# Patient Record
Sex: Female | Born: 1940 | Race: White | Hispanic: No | Smoking: Current some day smoker
Health system: Southern US, Community
[De-identification: ages and names within clinical notes are randomized; demographics above are authoritative.]

## PROBLEM LIST (undated history)

## (undated) ENCOUNTER — Emergency Department (HOSPITAL_COMMUNITY): Admission: EM | Payer: Medicare Other | Source: Home / Self Care

## (undated) DIAGNOSIS — R079 Chest pain, unspecified: Secondary | ICD-10-CM

## (undated) DIAGNOSIS — D509 Iron deficiency anemia, unspecified: Secondary | ICD-10-CM

## (undated) DIAGNOSIS — R0902 Hypoxemia: Secondary | ICD-10-CM

## (undated) DIAGNOSIS — J9601 Acute respiratory failure with hypoxia: Secondary | ICD-10-CM

## (undated) DIAGNOSIS — J449 Chronic obstructive pulmonary disease, unspecified: Secondary | ICD-10-CM

## (undated) DIAGNOSIS — T8859XA Other complications of anesthesia, initial encounter: Secondary | ICD-10-CM

## (undated) DIAGNOSIS — T4145XA Adverse effect of unspecified anesthetic, initial encounter: Secondary | ICD-10-CM

## (undated) DIAGNOSIS — K3189 Other diseases of stomach and duodenum: Secondary | ICD-10-CM

## (undated) DIAGNOSIS — R0602 Shortness of breath: Secondary | ICD-10-CM

## (undated) DIAGNOSIS — M542 Cervicalgia: Secondary | ICD-10-CM

## (undated) DIAGNOSIS — F17200 Nicotine dependence, unspecified, uncomplicated: Secondary | ICD-10-CM

## (undated) DIAGNOSIS — M25522 Pain in left elbow: Secondary | ICD-10-CM

## (undated) DIAGNOSIS — R911 Solitary pulmonary nodule: Secondary | ICD-10-CM

## (undated) DIAGNOSIS — I499 Cardiac arrhythmia, unspecified: Secondary | ICD-10-CM

## (undated) DIAGNOSIS — Z9889 Other specified postprocedural states: Secondary | ICD-10-CM

## (undated) DIAGNOSIS — R51 Headache: Secondary | ICD-10-CM

## (undated) DIAGNOSIS — K2950 Unspecified chronic gastritis without bleeding: Secondary | ICD-10-CM

## (undated) DIAGNOSIS — G8929 Other chronic pain: Secondary | ICD-10-CM

## (undated) DIAGNOSIS — R634 Abnormal weight loss: Secondary | ICD-10-CM

## (undated) DIAGNOSIS — R1031 Right lower quadrant pain: Secondary | ICD-10-CM

## (undated) DIAGNOSIS — R1314 Dysphagia, pharyngoesophageal phase: Secondary | ICD-10-CM

## (undated) DIAGNOSIS — R519 Headache, unspecified: Secondary | ICD-10-CM

## (undated) DIAGNOSIS — F419 Anxiety disorder, unspecified: Secondary | ICD-10-CM

## (undated) DIAGNOSIS — E46 Unspecified protein-calorie malnutrition: Secondary | ICD-10-CM

## (undated) DIAGNOSIS — R413 Other amnesia: Secondary | ICD-10-CM

## (undated) DIAGNOSIS — E785 Hyperlipidemia, unspecified: Secondary | ICD-10-CM

## (undated) HISTORY — DX: Acute respiratory failure with hypoxia: J96.01

## (undated) HISTORY — DX: Right lower quadrant pain: R10.31

## (undated) HISTORY — DX: Chronic obstructive pulmonary disease, unspecified: J44.9

## (undated) HISTORY — DX: Pain in left elbow: M25.522

## (undated) HISTORY — DX: Solitary pulmonary nodule: R91.1

## (undated) HISTORY — DX: Other diseases of stomach and duodenum: K31.89

## (undated) HISTORY — DX: Other amnesia: R41.3

## (undated) HISTORY — DX: Anxiety disorder, unspecified: F41.9

## (undated) HISTORY — DX: Abnormal weight loss: R63.4

## (undated) HISTORY — DX: Unspecified protein-calorie malnutrition: E46

## (undated) HISTORY — PX: APPENDECTOMY: SHX54

## (undated) HISTORY — DX: Hyperlipidemia, unspecified: E78.5

## (undated) HISTORY — PX: INGUINAL HERNIA REPAIR: SUR1180

## (undated) HISTORY — PX: TONSILLECTOMY: SUR1361

## (undated) HISTORY — DX: Other specified postprocedural states: Z98.890

## (undated) HISTORY — PX: CHOLECYSTECTOMY: SHX55

## (undated) HISTORY — DX: Unspecified chronic gastritis without bleeding: K29.50

## (undated) HISTORY — DX: Shortness of breath: R06.02

## (undated) HISTORY — DX: Iron deficiency anemia, unspecified: D50.9

## (undated) HISTORY — DX: Chest pain, unspecified: R07.9

## (undated) HISTORY — PX: TOTAL ABDOMINAL HYSTERECTOMY: SHX209

## (undated) HISTORY — DX: Dysphagia, pharyngoesophageal phase: R13.14

## (undated) HISTORY — DX: Nicotine dependence, unspecified, uncomplicated: F17.200

## (undated) HISTORY — DX: Hypoxemia: R09.02

---

## 2004-05-19 ENCOUNTER — Emergency Department (HOSPITAL_COMMUNITY): Admission: EM | Admit: 2004-05-19 | Discharge: 2004-05-19 | Payer: Self-pay | Admitting: Emergency Medicine

## 2004-10-31 ENCOUNTER — Observation Stay (HOSPITAL_COMMUNITY): Admission: EM | Admit: 2004-10-31 | Discharge: 2004-11-01 | Payer: Self-pay | Admitting: Emergency Medicine

## 2005-03-29 ENCOUNTER — Emergency Department (HOSPITAL_COMMUNITY): Admission: EM | Admit: 2005-03-29 | Discharge: 2005-03-29 | Payer: Self-pay | Admitting: *Deleted

## 2005-04-19 ENCOUNTER — Emergency Department (HOSPITAL_COMMUNITY): Admission: EM | Admit: 2005-04-19 | Discharge: 2005-04-19 | Payer: Self-pay | Admitting: Emergency Medicine

## 2006-07-21 ENCOUNTER — Emergency Department (HOSPITAL_COMMUNITY): Admission: EM | Admit: 2006-07-21 | Discharge: 2006-07-21 | Payer: Self-pay | Admitting: Emergency Medicine

## 2006-09-18 ENCOUNTER — Emergency Department (HOSPITAL_COMMUNITY): Admission: EM | Admit: 2006-09-18 | Discharge: 2006-09-18 | Payer: Self-pay | Admitting: Emergency Medicine

## 2007-02-17 ENCOUNTER — Encounter: Admission: RE | Admit: 2007-02-17 | Discharge: 2007-02-17 | Payer: Self-pay | Admitting: Internal Medicine

## 2007-03-26 ENCOUNTER — Inpatient Hospital Stay (HOSPITAL_COMMUNITY): Admission: EM | Admit: 2007-03-26 | Discharge: 2007-03-27 | Payer: Self-pay | Admitting: Emergency Medicine

## 2007-07-18 ENCOUNTER — Emergency Department (HOSPITAL_COMMUNITY): Admission: EM | Admit: 2007-07-18 | Discharge: 2007-07-19 | Payer: Self-pay | Admitting: Emergency Medicine

## 2007-09-16 ENCOUNTER — Emergency Department (HOSPITAL_COMMUNITY): Admission: EM | Admit: 2007-09-16 | Discharge: 2007-09-16 | Payer: Self-pay | Admitting: Emergency Medicine

## 2007-12-24 DIAGNOSIS — Z9889 Other specified postprocedural states: Secondary | ICD-10-CM

## 2007-12-24 HISTORY — DX: Other specified postprocedural states: Z98.890

## 2008-09-07 ENCOUNTER — Ambulatory Visit (HOSPITAL_COMMUNITY): Admission: RE | Admit: 2008-09-07 | Discharge: 2008-09-07 | Payer: Self-pay | Admitting: Family Medicine

## 2008-10-10 ENCOUNTER — Ambulatory Visit (HOSPITAL_COMMUNITY): Admission: RE | Admit: 2008-10-10 | Discharge: 2008-10-10 | Payer: Self-pay | Admitting: Internal Medicine

## 2008-10-10 ENCOUNTER — Ambulatory Visit: Payer: Self-pay | Admitting: Internal Medicine

## 2008-10-10 HISTORY — PX: COLONOSCOPY: SHX174

## 2009-04-07 ENCOUNTER — Emergency Department (HOSPITAL_COMMUNITY): Admission: EM | Admit: 2009-04-07 | Discharge: 2009-04-07 | Payer: Self-pay | Admitting: Emergency Medicine

## 2009-04-20 ENCOUNTER — Ambulatory Visit (HOSPITAL_COMMUNITY): Admission: RE | Admit: 2009-04-20 | Discharge: 2009-04-20 | Payer: Self-pay | Admitting: Family Medicine

## 2009-12-02 ENCOUNTER — Emergency Department (HOSPITAL_COMMUNITY): Admission: EM | Admit: 2009-12-02 | Discharge: 2009-12-02 | Payer: Self-pay | Admitting: Emergency Medicine

## 2009-12-23 ENCOUNTER — Emergency Department (HOSPITAL_COMMUNITY): Admission: EM | Admit: 2009-12-23 | Discharge: 2009-12-23 | Payer: Self-pay | Admitting: Emergency Medicine

## 2010-01-03 ENCOUNTER — Inpatient Hospital Stay (HOSPITAL_COMMUNITY)
Admission: EM | Admit: 2010-01-03 | Discharge: 2010-01-06 | Payer: Self-pay | Source: Home / Self Care | Admitting: Emergency Medicine

## 2010-05-28 ENCOUNTER — Emergency Department (HOSPITAL_COMMUNITY): Admission: EM | Admit: 2010-05-28 | Discharge: 2010-05-28 | Payer: Self-pay | Admitting: Emergency Medicine

## 2010-12-23 DIAGNOSIS — Z9889 Other specified postprocedural states: Secondary | ICD-10-CM

## 2010-12-23 HISTORY — DX: Other specified postprocedural states: Z98.890

## 2010-12-24 ENCOUNTER — Emergency Department (HOSPITAL_COMMUNITY)
Admission: EM | Admit: 2010-12-24 | Discharge: 2010-12-25 | Payer: Self-pay | Source: Home / Self Care | Admitting: Emergency Medicine

## 2011-03-10 LAB — D-DIMER, QUANTITATIVE: D-Dimer, Quant: 0.22 ug/mL-FEU (ref 0.00–0.48)

## 2011-03-10 LAB — BLOOD GAS, ARTERIAL
Acid-Base Excess: 2.9 mmol/L — ABNORMAL HIGH (ref 0.0–2.0)
Bicarbonate: 27.2 mEq/L — ABNORMAL HIGH (ref 20.0–24.0)
O2 Content: 2 L/min
O2 Saturation: 92.4 %
Patient temperature: 37
TCO2: 23.7 mmol/L (ref 0–100)
pCO2 arterial: 44 mmHg (ref 35.0–45.0)
pH, Arterial: 7.407 — ABNORMAL HIGH (ref 7.350–7.400)
pO2, Arterial: 62.4 mmHg — ABNORMAL LOW (ref 80.0–100.0)

## 2011-03-10 LAB — DIFFERENTIAL
Basophils Absolute: 0.1 10*3/uL (ref 0.0–0.1)
Basophils Relative: 1 % (ref 0–1)
Eosinophils Absolute: 0.9 10*3/uL — ABNORMAL HIGH (ref 0.0–0.7)
Eosinophils Relative: 11 % — ABNORMAL HIGH (ref 0–5)
Lymphocytes Relative: 33 % (ref 12–46)
Lymphs Abs: 2.7 10*3/uL (ref 0.7–4.0)
Monocytes Absolute: 0.6 10*3/uL (ref 0.1–1.0)
Monocytes Relative: 7 % (ref 3–12)
Neutro Abs: 3.9 10*3/uL (ref 1.7–7.7)
Neutrophils Relative %: 48 % (ref 43–77)

## 2011-03-10 LAB — CBC
HCT: 39.7 % (ref 36.0–46.0)
Hemoglobin: 13.3 g/dL (ref 12.0–15.0)
MCHC: 33.5 g/dL (ref 30.0–36.0)
MCV: 92.8 fL (ref 78.0–100.0)
Platelets: 232 10*3/uL (ref 150–400)
RBC: 4.27 MIL/uL (ref 3.87–5.11)
RDW: 12.9 % (ref 11.5–15.5)
WBC: 8 10*3/uL (ref 4.0–10.5)

## 2011-03-10 LAB — BASIC METABOLIC PANEL
BUN: 11 mg/dL (ref 6–23)
BUN: 14 mg/dL (ref 6–23)
CO2: 30 mEq/L (ref 19–32)
CO2: 31 mEq/L (ref 19–32)
Calcium: 9.5 mg/dL (ref 8.4–10.5)
Calcium: 9.5 mg/dL (ref 8.4–10.5)
Chloride: 100 mEq/L (ref 96–112)
Chloride: 104 mEq/L (ref 96–112)
Creatinine, Ser: 0.72 mg/dL (ref 0.4–1.2)
Creatinine, Ser: 1.06 mg/dL (ref 0.4–1.2)
GFR calc Af Amer: >60 mL/min (ref >60)
GFR calc Af Amer: >60 mL/min (ref >60)
GFR calc non Af Amer: 52 mL/min — ABNORMAL LOW (ref >60)
GFR calc non Af Amer: >60 mL/min (ref >60)
Glucose, Bld: 106 mg/dL — ABNORMAL HIGH (ref 70–99)
Glucose, Bld: 97 mg/dL (ref 70–99)
Potassium: 3.7 mEq/L (ref 3.5–5.1)
Potassium: 4.2 mEq/L (ref 3.5–5.1)
Sodium: 140 mEq/L (ref 135–145)
Sodium: 140 mEq/L (ref 135–145)

## 2011-03-10 LAB — CK TOTAL AND CKMB (NOT AT ARMC)
CK, MB: 3.2 ng/mL (ref 0.3–4.0)
Relative Index: INVALID (ref 0.0–2.5)
Total CK: 90 U/L (ref 7–177)

## 2011-03-10 LAB — TROPONIN I: Troponin I: 0.03 ng/mL (ref 0.00–0.06)

## 2011-03-11 ENCOUNTER — Encounter: Payer: Self-pay | Admitting: Gastroenterology

## 2011-03-11 ENCOUNTER — Ambulatory Visit (INDEPENDENT_AMBULATORY_CARE_PROVIDER_SITE_OTHER): Payer: Medicare Other | Admitting: Gastroenterology

## 2011-03-11 DIAGNOSIS — Z1211 Encounter for screening for malignant neoplasm of colon: Secondary | ICD-10-CM

## 2011-03-11 DIAGNOSIS — R1310 Dysphagia, unspecified: Secondary | ICD-10-CM | POA: Insufficient documentation

## 2011-03-11 DIAGNOSIS — R1319 Other dysphagia: Secondary | ICD-10-CM

## 2011-03-21 NOTE — Assessment & Plan Note (Signed)
Summary: PROBLEMS SWALLOWING/POSS EGD/LAW   Vital Signs:  Patient profile:   70 year old female Height:      64 inches Weight:      106 pounds BMI:     18.26 Temp:     98.7 degrees F oral Pulse rate:   72 / minute BP sitting:   110 / 76  (left arm)  Vitals Entered By: Carolan Clines LPN (March 11, 2011 2:45 PM)  Visit Type:  Initial Consult Referring Provider:  self-referral Primary Care Provider:  Dr. Renard Matter   History of Present Illness: 70 year old pleasant Caucasian female who presents with c/o dysphagia X a few months. Seems to be worsening. Difficulty with all types of textures. Denies epigastric pain. Mild, rare episodes of reflux. Did lose from 122 down to 95, now to 106. Was under a lot of stress at work, long hours, heavy lifting. Feels this may have been large contributor to wt loss. Denies lack of appetite. No nausea.  BMs normal, no melena or brbpr.   Used to live in [place]; had multipled EGD/EDs in past. Last one 6-7 years ago. Pt unsure why. Reports are being requested now.   Current Medications (verified): 1)  Advair Diskus 250-50 Mcg/dose Aepb (Fluticasone-Salmeterol) .... Take One Puff Two Times A Day 2)  Proventil Hfa 108 (90 Base) Mcg/act Aers (Albuterol Sulfate) .... One To Two Puffs Q4-6hrs As Needed  Allergies (verified): No Known Drug Allergies  Past History:  Past Medical History: Asthma COPD hx of dysphagia with multiple esophageal dilatations TCS 2009 with Dr. Jena Gauss: anal papilla, 2 cecal AVMs. Due in 2014.   Past Surgical History: Appendectomy inguinal hernia tonsillectomy total hysterectomy cholecystectomy  Family History: No FH of Colon Cancer:  Social History: Occupation: Retired from [employer] Patient currently smokes. 1ppd Alcohol Use - no Smoking Status:  current  Review of Systems General:  Denies fever, chills, and anorexia. Eyes:  Denies blurring, irritation, and discharge. ENT:  Complains of difficulty swallowing;  denies sore throat and hoarseness. CV:  Denies chest pains and syncope. Resp:  Denies dyspnea at rest and wheezing. GI:  See HPI. GU:  Denies urinary burning, urinary frequency, and urinary incontinence. MS:  Denies joint pain / LOM, joint swelling, and joint stiffness. Derm:  Denies rash, itching, dry skin, and hives. Neuro:  Denies weakness and syncope. Psych:  Denies depression and anxiety. Endo:  Denies cold intolerance and heat intolerance. Heme:  Denies bruising and bleeding.  Physical Exam  General:  Well developed, well nourished, no acute distress. Head:  Normocephalic and atraumatic. Eyes:  PERRLA, no icterus. Mouth:  No deformity or lesions, dentition normal. Lungs:  Clear throughout to auscultation. Heart:  Regular rate and rhythm; no murmurs, rubs,  or bruits. Abdomen:  +BS, thin, soft, non-distended. non-tender. no rebound or guarding. No HSM. no masses noted.  Msk:  Symmetrical with no gross deformities. Normal posture. Extremities:  No clubbing, cyanosis, edema or deformities noted. Neurologic:  Alert and  oriented x4;  grossly normal neurologically. Skin:  Intact without significant lesions or rashes. Psych:  Alert and cooperative. Normal mood and affect.   Impression & Recommendations:  Problem # 1:  DYSPHAGIA (ICD-73.46)  70 year old female with hx of multiple esophageal dilatations in [place] in past; last done 6-7 years ago. Do not currently have records. Presents with recurrent dysphagia with all types of textures, liquids. worsening over past few months. No nausea. No abdominal pain. rare reflux, mild. Likely due to esophageal web, ring, or stricture.  Did have episode of wt loss in past losing to upper 90s, yet has continued to regain wt. Feels may be attributed to stress at work. Will monitor closely. Low-likelihood of malignancy. Pt is of thin stature per her report.   EGD/ED with Dr. Jena Gauss in near future. The R/B/A have been discussed in detail with  pt; she desires to proceed Retrieve reports from [place] if possible As of note, pt states has had difficulty with awakening from sedation in past. Reports small amount needed. (Versed 3 mg, Demerol 50 at last colonoscopy). Monitor wt closely.   Orders: New Patient Level III (96295)  Problem # 2:  SCREENING COLORECTAL-CANCER (ICD-V76.51)  Last in 2009 with 2 cecal AVMs, anal papilla. Due for repeat in 2014. No problems currently.   Orders: New Patient Level III (28413)   Orders Added: 1)  New Patient Level III [24401]

## 2011-03-26 LAB — CBC
HCT: 39.7 % (ref 36.0–46.0)
MCV: 92 fL (ref 78.0–100.0)
Platelets: 265 10*3/uL (ref 150–400)
RBC: 4.32 MIL/uL (ref 3.87–5.11)
WBC: 9.3 10*3/uL (ref 4.0–10.5)

## 2011-03-26 LAB — DIFFERENTIAL
Eosinophils Absolute: 0.6 10*3/uL (ref 0.0–0.7)
Lymphocytes Relative: 49 % — ABNORMAL HIGH (ref 12–46)
Lymphs Abs: 4.6 10*3/uL — ABNORMAL HIGH (ref 0.7–4.0)
Monocytes Relative: 9 % (ref 3–12)
Neutrophils Relative %: 34 % — ABNORMAL LOW (ref 43–77)

## 2011-03-26 LAB — BASIC METABOLIC PANEL
Chloride: 108 mEq/L (ref 96–112)
Creatinine, Ser: 0.89 mg/dL (ref 0.4–1.2)
GFR calc Af Amer: >60 mL/min (ref >60)
GFR calc non Af Amer: >60 mL/min (ref >60)
Potassium: 4.2 mEq/L (ref 3.5–5.1)

## 2011-04-02 ENCOUNTER — Encounter: Payer: Medicare Other | Admitting: Internal Medicine

## 2011-04-02 ENCOUNTER — Ambulatory Visit (HOSPITAL_COMMUNITY)
Admission: RE | Admit: 2011-04-02 | Discharge: 2011-04-02 | Disposition: A | Discharge status: Home / Self Care | Payer: Medicare Other | Source: Ambulatory Visit | Attending: Internal Medicine | Admitting: Internal Medicine

## 2011-04-02 ENCOUNTER — Other Ambulatory Visit: Payer: Self-pay | Admitting: Internal Medicine

## 2011-04-02 DIAGNOSIS — J449 Chronic obstructive pulmonary disease, unspecified: Secondary | ICD-10-CM | POA: Insufficient documentation

## 2011-04-02 DIAGNOSIS — K2940 Chronic atrophic gastritis without bleeding: Secondary | ICD-10-CM

## 2011-04-02 DIAGNOSIS — K449 Diaphragmatic hernia without obstruction or gangrene: Secondary | ICD-10-CM | POA: Insufficient documentation

## 2011-04-02 DIAGNOSIS — R1310 Dysphagia, unspecified: Secondary | ICD-10-CM

## 2011-04-02 DIAGNOSIS — J4489 Other specified chronic obstructive pulmonary disease: Secondary | ICD-10-CM | POA: Insufficient documentation

## 2011-04-02 HISTORY — PX: ESOPHAGOGASTRODUODENOSCOPY: SHX1529

## 2011-04-03 ENCOUNTER — Encounter: Payer: Self-pay | Admitting: Gastroenterology

## 2011-04-03 NOTE — Progress Notes (Signed)
Received reports from [place]. Appears last EGD done in 2007: gastritis, esophagitis, s/p 50 French Maloney dilation. 2004: EGD with 50 French Maloney dilation, gastritis, +. H pylori. 2003: gastritis, duodenitis, + H pylori.    To be scanned into epic.

## 2011-04-04 NOTE — Progress Notes (Signed)
Reviewed by R. Michael Samadhi Mahurin, MD FACP FACG 

## 2011-04-08 NOTE — Op Note (Signed)
  [name], Katelyn [name]                 ACCOUNT NO.:  [id number]  MEDICAL RECORD NO.:  [id number]           PATIENT TYPE:  O  LOCATION:  DAYP                          FACILITY:  [facility]  PHYSICIAN:  R. Roetta Sessions, M.D. DATE OF BIRTH:  Nov 18, 1941  DATE OF PROCEDURE:  04/02/2011 DATE OF DISCHARGE:                              OPERATIVE REPORT   PROCEDURE:  EGD with Elease Hashimoto dilation followed by gastric and esophageal biopsy.  INDICATIONS FOR PROCEDURE:  A 70 year old lady with recurrent esophageal dysphagia.  She has undergone multiple dilations previously in [place], records have not yet arrived, although they have been requested through our office.  As planned, EGD now being done potential for esophageal dilation, etc. Have been reviewed.  All parties questions answered, all parties agreeable.  PROCEDURE NOTE:  O2 saturation, blood pressure, pulse, respirations were monitored throughout the entire procedure.  CONSCIOUS SEDATION:  Versed 2 mg IV, Demerol 50 mg IV in a single dose.  INSTRUMENT:  Pentax video chip system.  Cetacaine spray for topical pharyngeal anesthesia.  FINDINGS:  Examination of tubular esophagus revealed normal-appearing mucosa through the GE junction, specifically no esophagitis, no ring, web, or other abnormality was observed.  Stomach:  Gastric cavity was emptied and insufflated well with air. Thorough examination of  gastric mucosa including retroflexion of proximal stomach, esophagogastric junction demonstrated only a small hiatal hernia and some subtle submucosal petechiae and mottling of the gastric mucosa.  There was no ulcer or infiltrating process.  Pylorus was patent, easily traversed.  Examination of bulb and second portion was undertaken.  The patient had multiple erosions involving the bulb. There was no frank ulcer, however, D2 appeared normal.  THERAPEUTIC/DIAGNOSTIC MANEUVERS PERFORMED:  Scope was withdrawn.  A 54- French Maloney dilator  was passed to full insertion with ease.  A look back revealed no trauma related to passage of the dilator.  Subsequent biopsies in the mid distal esophagus were taken for histologic study. Subsequent gastric biopsies were taken to assess for H. pylori.  The patient tolerated the procedure well, was reactive to endoscopy.  IMPRESSION: 1. Endoscopically normal-appearing esophagus status post passage of a     Maloney dilator followed by biopsy. 2. Hiatal hernia, mottling, and submucosal petechiae in the gastric     mucosa of uncertain significance status post biopsy. 3. Bulbar erosions as described above.  RECOMMENDATIONS: 1. We will follow up on pending histology; hopefully we will be and     receive the records from [place] in the near future. 2. Further recommendations to follow.     Jonathon Bellows, M.D.     RMR/MEDQ  D:  04/02/2011  T:  04/02/2011  Job:  [id number]  Electronically Signed by Lorrin Goodell M.D. on 04/08/2011 12:34:25 PM

## 2011-04-14 ENCOUNTER — Encounter: Payer: Self-pay | Admitting: Internal Medicine

## 2011-04-25 ENCOUNTER — Encounter: Payer: Self-pay | Admitting: Gastroenterology

## 2011-04-25 ENCOUNTER — Ambulatory Visit (INDEPENDENT_AMBULATORY_CARE_PROVIDER_SITE_OTHER): Payer: Medicare Other | Admitting: Gastroenterology

## 2011-04-25 DIAGNOSIS — K2940 Chronic atrophic gastritis without bleeding: Secondary | ICD-10-CM

## 2011-04-25 DIAGNOSIS — R1319 Other dysphagia: Secondary | ICD-10-CM

## 2011-04-25 DIAGNOSIS — K2950 Unspecified chronic gastritis without bleeding: Secondary | ICD-10-CM

## 2011-04-25 MED ORDER — OMEPRAZOLE 20 MG PO CPDR: 20.0000 mg | DELAYED_RELEASE_CAPSULE | Freq: Every day | ORAL | Status: AC

## 2011-04-25 NOTE — Progress Notes (Signed)
  Referring Provider: self Primary Care Physician:  Alice Reichert, MD Primary Gastroenterologist: Dr. Jena Gauss  Chief Complaint  Patient presents with  . Follow-up    HPI:   [name] returns today in f/u after endoscopy done in April 2012 secondary to dysphagia. Path: mild chronic gastritis, bulbar erosions, submucosal petechia. Status post dilation. Does have hx of H.pylori in past. Returns today without abdominal pain, N/V. Denies dysphagia. Denies reflux. She is up 2 lbs. Has no complaints.   Past Medical History  Diagnosis Date  . Asthma   . COPD (chronic obstructive pulmonary disease)   . S/P colonoscopy 2009    dr. Jena Gauss: anal papilla, 2 cecal AVMs. Due in 2014  . Dysphagia     requiring multiple dilations in past  . S/P endoscopy 2012    Dr. Jena Gauss: mild chronic gastritis, bulbar erosions, s/p 54-French Maloney dilation   . S/P endoscopy 2003, 2004, 2007    gastritis, duodenitis, s/p dilation, +H.pylori     Past Surgical History  Procedure Date  . Appendectomy   . Inguinal hernia repair   . Tonsillectomy   . Total abdominal hysterectomy   . Cholecystectomy     Current Outpatient Prescriptions  Medication Sig Dispense Refill  . ADVAIR DISKUS 250-50 MCG/DOSE AEPB       . loratadine (CLARITIN) 10 MG tablet Take 10 mg by mouth daily.        . Multiple Vitamin (MULTIVITAMIN) capsule Take 1 capsule by mouth daily.                Allergies as of 04/25/2011  . (No Known Allergies)    Family History  Problem Relation Age of Onset  . Colon cancer Neg Hx     History   Social History  . Marital Status: Widowed    Spouse Name: N/A    Number of Children: N/A  . Years of Education: N/A   Social History Main Topics  . Smoking status: Current Everyday Smoker  . Smokeless tobacco: None  . Alcohol Use: No  . Drug Use: No     Review of Systems: Gen: Denies fever, chills, anorexia. Denies fatigue, weakness, weight loss.  CV: Denies chest pain, palpitations,  syncope, peripheral edema, and claudication. Resp: Denies dyspnea at rest, cough, wheezing, coughing up blood, and pleurisy. GI: Denies vomiting blood, jaundice, and fecal incontinence.   Denies dysphagia or odynophagia. Derm: Denies rash, itching, dry skin Psych: Denies depression, anxiety, memory loss, confusion. No homicidal or suicidal ideation.  Heme: Denies bruising, bleeding, and enlarged lymph nodes.  Physical Exam: BP 107/66  Pulse 57  Temp(Src) 97.9 F (36.6 C) (Tympanic)  Ht 5\' 7"  (1.702 m)  Wt 108 lb 9.6 oz (49.261 kg)  BMI 17.01 kg/m2 General:   Alert and oriented. No distress noted. Pleasant and cooperative.  Head:  Normocephalic and atraumatic. Eyes:  Conjuctiva clear without scleral icterus. Mouth:  Oral mucosa pink and moist. Good dentition. No lesions. Heart:  S1, S2 present without murmurs, rubs, or gallops. Regular rate and rhythm. Abdomen:  +BS, soft, non-tender and non-distended. No rebound or guarding. No HSM or masses noted. Msk:  Symmetrical without gross deformities. Normal posture. Extremities:  Without edema. Neurologic:  Alert and  oriented x4;  grossly normal neurologically. Skin:  Intact without significant lesions or rashes. Psych:  Alert and cooperative. Normal mood and affect.

## 2011-04-25 NOTE — Patient Instructions (Signed)
Begin Prilosec 20 mg by mouth daily, 30 minutes before breakfast for 3 months.  We will see you back in 6 months.   Contact our office if you have any reoccurrence of difficulty swallowing, abdominal pain, nausea or vomiting.

## 2011-04-28 ENCOUNTER — Encounter: Payer: Self-pay | Admitting: Gastroenterology

## 2011-04-28 DIAGNOSIS — K2950 Unspecified chronic gastritis without bleeding: Secondary | ICD-10-CM | POA: Insufficient documentation

## 2011-04-28 NOTE — Assessment & Plan Note (Signed)
Path with evidence of chronic gastritis. No H.pylori. Pt without N/V, abdominal pain. No reflux symptoms. Will continue PPI for 3 mos. Return in 6 mos for routine f/u or sooner if needed.  Prilosec 20 mg po 30 minutes before breakfast daily, # 31 with 3 refills.  Return in 6 mos

## 2011-04-28 NOTE — Assessment & Plan Note (Signed)
Hx of multiple dilations in past (2003, 2004, 2007, most recently April 2012). Doing well, dysphagia significantly improved/resolved.

## 2011-04-29 NOTE — Progress Notes (Signed)
Cc to PCP 

## 2011-05-07 NOTE — Op Note (Signed)
NAMEBRIGID, Katelyn [name]                 ACCOUNT NO.:  [id number]   MEDICAL RECORD NO.:  [id number]          PATIENT TYPE:  AMB   LOCATION:  DAY                           FACILITY:  [facility]   PHYSICIAN:  R. Roetta Sessions, M.D. DATE OF BIRTH:  1941/10/22   DATE OF PROCEDURE:  10/10/2008  DATE OF DISCHARGE:                               OPERATIVE REPORT   PROCEDURE:  Surveillance colonoscopy.   INDICATIONS FOR PROCEDURE:  A 70 year old lady was seen at the request  of Dr. Renard Matter for surveillance colonoscopy.  She has a history of  multiple colonoscopies  in [place], where polyps were removed  previously.  She tells me her last colonoscopy was a good 5 years ago  and the polyps were found.  She has no lower GI tract symptoms and no  family history of polyps or colorectal cancer.  Colonoscopy is now being  done as a surveillance maneuver.  Risks, benefits, alternatives, and  limitations have been reviewed, questions were answered, and all parties  agreeable.   PROCEDURE NOTE:  O2 saturation, blood pressure, pulse, and respirations  were monitored throughout the entire procedure.   CONSCIOUS SEDATION:  Versed 3 mg IV and Demerol 50 mg IV in divided  doses.   INSTRUMENT:  Pentax video chip system.   FINDINGS:  Digital rectal exam revealed no abnormalities.  Endoscopic  findings:  The prep was good colon:  Colon:  Colonic mucosa was surveyed  from the rectosigmoid junction through the left transverse, right colon,  through the appendiceal orifice, ileocecal valve, and cecum.  These  structures were well seen and photographed for the record.  From this  level, a scope was slowly and cautiously withdrawn.  All previously  mentioned mucosal surfaces were again seen.  The patient was noted to  have two 2-3 mm AVMs in the base of the cecum; otherwise, colonic mucosa  appeared normal.  Scope was pulled down the rectum with thorough  examination of rectal mucosa including retroflexed view of the  anal  verge demonstrated only anal papilla.  Rectal mucosa was otherwise  normal appearing.  The patient tolerated the procedure well and was  reacted to Endoscopy.   IMPRESSION:  1. Anal papilla, otherwise normal rectum.  2. Two cecal arteriovenous malformations, colonic mucosa appeared      normal.   RECOMMENDATIONS:  Repeat surveillance colonoscopy in 5 years.      Jonathon Bellows, M.D.  Electronically Signed     RMR/MEDQ  D:  10/10/2008  T:  10/11/2008  Job:  [id number]   cc:   Angus G. Renard Matter, MD  Fax: [phone]

## 2011-05-10 NOTE — H&P (Signed)
Katelyn [name]                 ACCOUNT NO.:  [id number]   MEDICAL RECORD NO.:  [id number]          PATIENT TYPE:  INP   LOCATION:  A208                          FACILITY:  [facility]   PHYSICIAN:  Angus G. Renard Matter, MD   DATE OF BIRTH:  1941/01/22   DATE OF ADMISSION:  10/31/2004  DATE OF DISCHARGE:  LH                                HISTORY & PHYSICAL   HISTORY OF PRESENT ILLNESS:  This 70 year old white female was admitted  through the ED, having presented there with increased dyspnea and wheezing  over the past few days.  She does have a history of COPD and asthma.  She  was given nebulizer treatments in the ED.  Started on Levaquin and Solu-  Medrol.   LABORATORY DATA:  CBC with WBC 8400 with hemoglobin 10.9, hematocrit 33, 51  neutrophils, 35 lymphocytes and 7 monocytes.  Blood gas with pH 7.350 with a  pCO2 of 35 and pO2 80.  Chemistries with sodium 141, potassium 3.7, chloride  107, CO2 28, glucose 146, BUN 5, creatinine 1, calcium 8.8, total protein  6.2, albumin 3.4, SGOT 37, SGPT 32, alkaline phosphatase 101 and bilirubin  0.5.  The patient had a chest x-ray which did not show any acute disease,  but COPD.  The patient was admitted for further care.   SOCIAL HISTORY:  The patient does smoke.  Does not use alcohol.   FAMILY HISTORY:  Noncontributory.  See previous record.   PAST MEDICAL HISTORY:  The patient has a history of bronchitis and asthma.   ALLERGIES:  She has no known allergies.   MEDICATION LIST:  1.  Estradiol.  2.  Nexium.  3.  Multivitamins.  4.  Singulair 10 mg.   REVIEW OF SYSTEMS:  HEENT:  Negative.  CARDIOPULMONARY:  The patient has had  cough and increased dyspnea over the past few days.  GASTROINTESTINAL:  No  bowel irregularities.  GENITOURINARY:  No dysuria or hematuria.   PHYSICAL EXAMINATION:  GENERAL APPEARANCE:  An uncomfortable white female.  VITAL SIGNS:  Blood pressure 124/79, respirations 32, temperature 97  degrees.  HEENT:  Eyes:   PERRLA.  TMs negative.  Oropharynx benign.  NECK:  No JVD or thyroid abnormalities.  LUNGS:  Increased respiratory rate with rhonchi and wheezes bilaterally.  HEART:  Regular rhythm.  No murmurs.  ABDOMEN:  No palpable murmurs or masses.  SKIN:  Warm and dry.  EXTREMITIES:  Free of edema.  NEUROLOGIC:  No focal deficit.   DIAGNOSES:  1.  Chronic obstructive pulmonary disease in exacerbation.  2.  Bronchial asthma.     Angu   AGM/MEDQ  D:  10/31/2004  T:  10/31/2004  Job:  [id number]

## 2011-05-10 NOTE — Group Therapy Note (Signed)
NAMESAKARA, Katelyn [name]                 ACCOUNT NO.:  [id number]   MEDICAL RECORD NO.:  [id number]          PATIENT TYPE:  INP   LOCATION:  A208                          FACILITY:  [facility]   PHYSICIAN:  Angus G. Renard Matter, MD   DATE OF BIRTH:  [birth year]   DATE OF PROCEDURE:  DATE OF DISCHARGE:                                   PROGRESS NOTE   SUBJECTIVE:  This patient was admitted through the ED with increased dyspnea  and wheezing and what was felt to be an exacerbation of COPD and asthma.  She is more comfortable now.  Oxygen saturation 94.7.  Blood gases on  admission showed a pH of 7.386 with a PCO2 43.4 and a PO2 of 182.0.   PHYSICAL EXAMINATION:  VITAL SIGNS:  Blood pressure 123/70, respirations 22,  pulse 103, temperature 97.8.  LUNGS:  Occasional rhonchus heard.  HEART:  Regular rhythm.  ABDOMEN:  No palpable organs or masses.   ASSESSMENT:  The patient was admitted with exacerbation of chronic  obstructive pulmonary disease and bronchial asthma.  Plan to continue  current regimen.     Angu   AGM/MEDQ  D:  11/01/2004  T:  11/01/2004  Job:  [id number]

## 2011-05-10 NOTE — H&P (Signed)
NAMESCOTTY, PINDER NO.:  [id number]   MEDICAL RECORD NO.:  [id number]          PATIENT TYPE:  INP   LOCATION:  A331                          FACILITY:  [facility]   PHYSICIAN:  Catalina Pizza, M.D.        DATE OF BIRTH:  07/01/1941   DATE OF ADMISSION:  03/26/2007  DATE OF DISCHARGE:  LH                              HISTORY & PHYSICAL   Ms. Chumley is a pleasant 70 year old white female with past medical  history significant for asthma/reactive airway disease on top of COPD.  Apparently over the last couple days, she states that her breathing has  become more and more short.  She has noted increased wheezing.  She  started use her nebulizer last evening approximately every 4 hours.  Denies any fever or chills.  Occasional cough with pain with cough, but  no significant production.  She went to work today and with heavy  lifting and exertion, she stated that her breathing was getting  progressively worse and came the emergency department for further workup  and treatment.  Found to have a PO2 of 58.8 on room air which looks to  be probably her baseline given the respiratory compensation.   PAST MEDICAL HISTORY:  Significant for:  1. Asthma and reactive airway disease starting at age 68.  2. Osteoporosis.  3. History of irregular heartbeat, and question of a leaky valve      before.   PAST SURGICAL HISTORY:  1. Hysterectomy at age 30.  2. Cholecystectomy.  3. Appendectomy.  4. Right inguinal hernia repair.  5. Tonsillectomy.   MEDICATIONS:  She is on:  1. Fexofenadine 180 mg p.o. daily.  2. Advair 250/50 mcg only once daily secondary to cost.  3. Spiriva once daily.  Unclear if she is actually taking this      medicine although she states she is.  4. She has been using Ventolin HFA approximately four times a day      since she has started feeling short of breath, usually does not use      it daily.  5. She is also on Estradiol 1 mg once daily.   SOCIAL HISTORY:   She works at the [employer] store.  Has a  48+-pack-year smoking history and continues to smoke approximately 1  pack per day.  No alcohol or other drug use.   FAMILY HISTORY:  Mother died at age 31 of some type of heart problem.  Father died at 12, had black lung, emphysema, lung cancer times two  different times.  Had one sister with a breast surgery, question of  breast cancer.  Half-brothers and half-sisters who all have heart  problems as well.   ALLERGIES:  No known drug allergies.   REVIEW OF SYSTEMS:  Negative for HEENT symptoms.  Only positive per HPI.  Denies any problems with abdominal pain.  No problems with bowel or  bladder.  No other neurologic deficits or problems.   PHYSICAL EXAM:  VITAL SIGNS: Temperature is 97.3, blood pressure 109/68,  heart rate 79, respirations ranging  between 16 and 20.  She is satting  between 91% and 98% on room air.  GENERAL:  This is a white female looking older than stated age, lying in  bed in no acute distress.  HEENT:  Unremarkable.  Pupils equal and react to light and  accommodation.  Oropharynx is clear.  Extraocular movements intact.  NECK:  Supple.  No JVD.  No thyromegaly.  No carotid bruits.  LUNGS:  Does have inspiratory and expiratory wheezes heard in all lung  fields.  Poor air flow at the bases bilaterally but good at the upper  lobes.  No specific rhonchi or consolidation heard.  HEART:  Regular rate and rhythm with an occasional abnormal beat but no  murmurs appreciated.  ABDOMEN:  Soft, nontender, nondistended, positive bowel sounds.  EXTREMITIES:  No lower extremity edema.  No signs of cyanosis.  NEUROLOGIC: Alert and oriented x3.  Cranial nerves, II through XII,  intact.  No focal deficits.   LABORATORY DATA:  CBC showed white count 9.6, hemoglobin 12.6, platelet  count of 255.  BMET shows sodium 139, potassium 3.9, chloride 106, CO2  of 28, creatinine 1.01.  D-dimer 0.22.  BNP 64.7.  ABG on room air   showed pH of 7.39, PCO2 of 43, PO2 of 58.8.   IMAGES OBTAINED:  Chest x-ray reveals minimal chronic bronchitic changes  and hyperinflation which is stable.  No acute abnormalities.  Questionable tiny nodular density versus some artifact of the right  base.  We will get follow-up exam in the morning.   IMPRESSION:  This is a 70 year old white female with:  1. Chronic obstructive pulmonary disease exacerbation.  2. Continued tobacco abuse.  3. Questionable history of reactive airways disease.   ASSESSMENT/PLAN:  1. Chronic obstructive pulmonary disease exacerbation/reactive airway      disease/asthma.  I believe this more related to her smoking and I      question whether she had some early-type bronchitis that turned      this over.  We will continue on the full regimen of antibiotics, O2      at 2 L for now given her shortness of breath, nebs every 6 hours.      We will hold on her Advair and Spiriva at this time until back      home.  We will start her on 1 or 2 doses of IV steroids at this      time and will transition to oral.  2. Small nodular density in lung may be artifact, but we will repeat a      posterior-apical and lateral in the morning.   DISPOSITION:  The patient will be seen and assessed tomorrow.  If having  significant improvement, will continue on steroids and full regimen, and  get the patient to follow up as an outpatient.  Possible discharge  tomorrow if significant improvement.      Catalina Pizza, M.D.  Electronically Signed    ZH/MEDQ  D:  03/26/2007  T:  03/27/2007  Job:  [id number]

## 2011-05-10 NOTE — Discharge Summary (Signed)
NAMECYLIE, Katelyn [name]                 ACCOUNT NO.:  [id number]   MEDICAL RECORD NO.:  [id number]          PATIENT TYPE:  INP   LOCATION:  A331                          FACILITY:  [facility]   PHYSICIAN:  Catalina Pizza, M.D.        DATE OF BIRTH:  1941-07-19   DATE OF ADMISSION:  03/26/2007  DATE OF DISCHARGE:  04/04/2008LH                               DISCHARGE SUMMARY   DISCHARGE DIAGNOSES:  1. Chronic obstructive pulmonary disease exacerbation.  2. Question bronchitis.  3. Reactive airways disease.   DISCHARGE MEDICATIONS:  1. Advair 250/50 inhaled b.i.d.  She will resume in 7 days after      discharge.  2. Spiriva inhaled daily.  Hold for next 7 days as well.  3. Prednisone taper 60 mg, 50 mg, 40 mg, etc., each morning until      gone.  4. Zithromax 500 mg today and 250 for the next 4 days.  5. Estradiol once daily.  6. Allegra 180 mg once daily.  7. Albuterol MDI/neb at least three times a day for 1 week and then as      needed every 6 hours.   BRIEF HOSPITAL COURSE:  Katelyn [name] is a 70 year old female with  significant COPD and continued tobacco abuse who was having worsening  shortness of breath and increased use of nebulizer/MDIs prior to  admission came in with low pO2 does have respiratory compensation.  Believe to be a COPD type exacerbation.  She was started on IV  antibiotics, IV steroids and routine nebulizer treatments and improved  almost immediately.   PHYSICAL EXAM AT TIME OF DISCHARGE SHOWED:  VITAL SIGNS:  Temperature  98.1, blood pressure 110/68, pulse 84, respirations 20, satting 93-95%  on room air.  GENERAL:  This is a thin elderly white female lying in bed in no acute  distress.  HEENT:  Is unremarkable.  Pupils equal, round, and react to light and  accommodation.  NECK:  Is supple.  No JVD.  LUNGS:  Do not appreciate any wheezing at this time, at time of  discharge does have longer expiratory phase, no rhonchi appreciated.  HEART:  Is regular rate and  rhythm.  No murmurs, gallops, or rubs.  ABDOMEN:  Soft, nontender, nondistended, positive bowel sounds.  EXTREMITIES:  No lower extremity edema.  NEUROLOGIC:  No deficits appreciated.   LABORATORY DATA OBTAINED DURING HOSPITALIZATION:  Initial CBC showed a  white count of 9.6, hemoglobin 12.6, platelet count 255.  Initial BMET  was normal with BUN of 22, creatinine 1.01, BNP was low at 64.7.  ABG on  room air showed pH of 7.39, pCO2 of 43.4 and pO2 of 58.8.  CBC at time  of discharge showed white count 8.3, hemoglobin of 12.3, platelet count  of 266.  BMET was normal with BUN of 18, creatinine 0.84, mildly  elevated glucose of 128, calcium of 9.7.   IMAGES OBTAINED DURING HOSPITALIZATION:  Initial chest x-ray revealed  minimal chronic bronchitic changes and hyperinflation, stable, a  question of some summation artifact right base.  Attained a two-view  chest x-ray on the next day which showed no acute disease, specifically  no nodule seen and question on the day before.   DISPOSITION:  The patient had so much improvement after 1 day.  She is  to continue back on her medicines as outlined above and follow up in  approximately 3 weeks in the office to see that she is remaining stable  at that time.      Catalina Pizza, M.D.  Electronically Signed     ZH/MEDQ  D:  04/17/2007  T:  04/17/2007  Job:  [id number]

## 2011-05-18 ENCOUNTER — Emergency Department (HOSPITAL_COMMUNITY): Payer: Medicare Other

## 2011-05-18 ENCOUNTER — Emergency Department (HOSPITAL_COMMUNITY)
Admission: EM | Admit: 2011-05-18 | Discharge: 2011-05-18 | Disposition: A | Discharge status: Home / Self Care | Payer: Medicare Other | Attending: Emergency Medicine | Admitting: Emergency Medicine

## 2011-05-18 DIAGNOSIS — J4489 Other specified chronic obstructive pulmonary disease: Secondary | ICD-10-CM | POA: Insufficient documentation

## 2011-05-18 DIAGNOSIS — Z79899 Other long term (current) drug therapy: Secondary | ICD-10-CM | POA: Insufficient documentation

## 2011-05-18 DIAGNOSIS — J449 Chronic obstructive pulmonary disease, unspecified: Secondary | ICD-10-CM | POA: Insufficient documentation

## 2011-05-18 LAB — DIFFERENTIAL
Basophils Absolute: 0.1 10*3/uL (ref 0.0–0.1)
Basophils Relative: 1 % (ref 0–1)
Eosinophils Absolute: 0.3 10*3/uL (ref 0.0–0.7)
Eosinophils Relative: 4 % (ref 0–5)
Lymphocytes Relative: 54 % — ABNORMAL HIGH (ref 12–46)
Lymphs Abs: 4.3 10*3/uL — ABNORMAL HIGH (ref 0.7–4.0)
Monocytes Absolute: 0.7 10*3/uL (ref 0.1–1.0)
Monocytes Relative: 9 % (ref 3–12)
Neutro Abs: 2.6 10*3/uL (ref 1.7–7.7)
Neutrophils Relative %: 33 % — ABNORMAL LOW (ref 43–77)

## 2011-05-18 LAB — BASIC METABOLIC PANEL
BUN: 14 mg/dL (ref 6–23)
CO2: 29 mEq/L (ref 19–32)
Calcium: 9.8 mg/dL (ref 8.4–10.5)
Chloride: 104 mEq/L (ref 96–112)
Creatinine, Ser: 0.95 mg/dL (ref 0.4–1.2)
Glucose, Bld: 107 mg/dL — ABNORMAL HIGH (ref 70–99)

## 2011-05-18 LAB — CBC
HCT: 36.2 % (ref 36.0–46.0)
Hemoglobin: 12 g/dL (ref 12.0–15.0)
MCH: 29.6 pg (ref 26.0–34.0)
MCHC: 33.1 g/dL (ref 30.0–36.0)
MCV: 89.4 fL (ref 78.0–100.0)
Platelets: 254 10*3/uL (ref 150–400)
RBC: 4.05 MIL/uL (ref 3.87–5.11)
RDW: 13.3 % (ref 11.5–15.5)
WBC: 7.9 10*3/uL (ref 4.0–10.5)

## 2011-10-25 ENCOUNTER — Ambulatory Visit: Payer: Medicare Other | Admitting: Internal Medicine

## 2011-12-30 ENCOUNTER — Ambulatory Visit (HOSPITAL_COMMUNITY)
Admission: RE | Admit: 2011-12-30 | Discharge: 2011-12-30 | Disposition: A | Discharge status: Home / Self Care | Payer: Medicare Other | Source: Ambulatory Visit | Attending: Family Medicine | Admitting: Family Medicine

## 2011-12-30 ENCOUNTER — Other Ambulatory Visit (HOSPITAL_COMMUNITY): Payer: Self-pay | Admitting: Family Medicine

## 2011-12-30 DIAGNOSIS — F17200 Nicotine dependence, unspecified, uncomplicated: Secondary | ICD-10-CM

## 2011-12-30 DIAGNOSIS — R079 Chest pain, unspecified: Secondary | ICD-10-CM | POA: Insufficient documentation

## 2011-12-30 DIAGNOSIS — J449 Chronic obstructive pulmonary disease, unspecified: Secondary | ICD-10-CM | POA: Insufficient documentation

## 2011-12-30 DIAGNOSIS — R0789 Other chest pain: Secondary | ICD-10-CM

## 2011-12-30 DIAGNOSIS — J4489 Other specified chronic obstructive pulmonary disease: Secondary | ICD-10-CM | POA: Insufficient documentation

## 2012-08-22 ENCOUNTER — Emergency Department (HOSPITAL_COMMUNITY): Payer: Medicare Other

## 2012-08-22 ENCOUNTER — Encounter (HOSPITAL_COMMUNITY): Payer: Self-pay | Admitting: Emergency Medicine

## 2012-08-22 ENCOUNTER — Emergency Department (HOSPITAL_COMMUNITY)
Admission: EM | Admit: 2012-08-22 | Discharge: 2012-08-22 | Disposition: A | Discharge status: Home / Self Care | Payer: Medicare Other | Attending: Emergency Medicine | Admitting: Emergency Medicine

## 2012-08-22 DIAGNOSIS — J4489 Other specified chronic obstructive pulmonary disease: Secondary | ICD-10-CM | POA: Insufficient documentation

## 2012-08-22 DIAGNOSIS — R079 Chest pain, unspecified: Secondary | ICD-10-CM | POA: Insufficient documentation

## 2012-08-22 DIAGNOSIS — R0781 Pleurodynia: Secondary | ICD-10-CM

## 2012-08-22 DIAGNOSIS — J449 Chronic obstructive pulmonary disease, unspecified: Secondary | ICD-10-CM | POA: Insufficient documentation

## 2012-08-22 DIAGNOSIS — F17200 Nicotine dependence, unspecified, uncomplicated: Secondary | ICD-10-CM | POA: Insufficient documentation

## 2012-08-22 MED ORDER — ACETAMINOPHEN-CODEINE #3 300-30 MG PO TABS: 1.0000 | ORAL_TABLET | Freq: Four times a day (QID) | ORAL | Status: AC | PRN

## 2012-08-22 NOTE — ED Provider Notes (Signed)
History     CSN: [id number]  Arrival date & time 08/22/12  0650   First MD Initiated Contact with Patient 08/22/12 0805      Chief Complaint  Patient presents with  . rib pain     (Consider location/radiation/quality/duration/timing/severity/associated sxs/prior treatment) HPI Comments: Patient reports pain in the left lower rib area for approximately one week. She denies any recent fall accident or injury. She states that she has some mild cough because of chronic lung disease and asthma but no new pattern of cough reported. There's been no hemoptysis. The patient occasionally has productive phlegm but no new changes ordered. Patient states" I just woke up one morning with it hurting". The patient denies any chest pain or unusual shortness of breath. His been no high fever. No palpitations. Patient presents for evaluation at this time.  The history is provided by the patient.    Past Medical History  Diagnosis Date  . Asthma   . COPD (chronic obstructive pulmonary disease)   . S/P colonoscopy 2009    dr. Jena Gauss: anal papilla, 2 cecal AVMs. Due in 2014  . Dysphagia     requiring multiple dilations in past  . S/P endoscopy 2012    Dr. Jena Gauss: mild chronic gastritis, bulbar erosions, s/p 54-French Maloney dilation   . S/P endoscopy 2003, 2004, 2007    gastritis, duodenitis, s/p dilation, +H.pylori     Past Surgical History  Procedure Date  . Appendectomy   . Inguinal hernia repair   . Tonsillectomy   . Total abdominal hysterectomy   . Cholecystectomy     Family History  Problem Relation Age of Onset  . Colon cancer Neg Hx     History  Substance Use Topics  . Smoking status: Current Everyday Smoker  . Smokeless tobacco: Not on file  . Alcohol Use: No    OB History    Grav Para Term Preterm Abortions TAB SAB Ect Mult Living                  Review of Systems  Constitutional: Negative for activity change.       All ROS Neg except as noted in HPI  HENT:  Negative for nosebleeds and neck pain.   Eyes: Negative for photophobia and discharge.  Respiratory: Positive for shortness of breath and wheezing. Negative for cough.   Cardiovascular: Negative for chest pain and palpitations.  Gastrointestinal: Negative for abdominal pain and blood in stool.  Genitourinary: Negative for dysuria, frequency and hematuria.  Musculoskeletal: Positive for arthralgias. Negative for back pain.  Skin: Negative.   Neurological: Negative for dizziness, seizures and speech difficulty.  Psychiatric/Behavioral: Negative for hallucinations and confusion.    Allergies  Review of patient's allergies indicates no known allergies.  Home Medications   Current Outpatient Rx  Name Route Sig Dispense Refill  . ADVAIR DISKUS 250-50 MCG/DOSE IN AEPB Oral Take 1 puff by mouth 2 (two) times daily.     . ALBUTEROL SULFATE HFA 108 (90 BASE) MCG/ACT IN AERS Inhalation Inhale 2 puffs into the lungs every 6 (six) hours as needed.    Marland Kitchen LORATADINE 10 MG PO TABS Oral Take 10 mg by mouth daily.     . MULTIVITAMINS PO CAPS Oral Take 1 capsule by mouth daily.      . ACETAMINOPHEN-CODEINE #3 300-30 MG PO TABS Oral Take 1-2 tablets by mouth every 6 (six) hours as needed for pain. 20 tablet 0    BP 104/56  Pulse 74  Temp 98 F (36.7 C) (Oral)  Resp 16  SpO2 96%  Physical Exam  Nursing note and vitals reviewed. Constitutional: She is oriented to person, place, and time. She appears well-developed and well-nourished.  Non-toxic appearance.  HENT:  Head: Normocephalic.  Right Ear: Tympanic membrane and external ear normal.  Left Ear: Tympanic membrane and external ear normal.  Eyes: EOM and lids are normal. Pupils are equal, round, and reactive to light.  Neck: Normal range of motion. Neck supple. Carotid bruit is not present.  Cardiovascular: Normal rate, regular rhythm, normal heart sounds, intact distal pulses and normal pulses.   Pulmonary/Chest: Breath sounds normal. No  respiratory distress.       No bruising noted of the left chest wall. There is no crepitus appreciated. There is tenderness to palpation and tenderness with attempted range of motion involving the left flank area. There is no CVA tenderness on the left. Lung exam reveals coarse breath sounds but no focal consolidation.  Abdominal: Soft. Bowel sounds are normal. There is no tenderness. There is no guarding.  Musculoskeletal: Normal range of motion.  Lymphadenopathy:       Head (right side): No submandibular adenopathy present.       Head (left side): No submandibular adenopathy present.    She has no cervical adenopathy.  Neurological: She is alert and oriented to person, place, and time. She has normal strength. No cranial nerve deficit or sensory deficit.  Skin: Skin is warm and dry.  Psychiatric: She has a normal mood and affect. Her speech is normal.    ED Course  Procedures (including critical care time)  Labs Reviewed - No data to display Dg Ribs Unilateral W/chest Left  08/22/2012  *RADIOLOGY REPORT*  Clinical Data: Pain and swelling  LEFT RIBS AND CHEST - 3+ VIEW  Comparison: 12/30/2011  Findings: No pneumothorax or effusion.  Lungs clear.  Heart size normal.  No displaced fracture or other focal lesion. Vascular clips in the right upper abdomen.  IMPRESSION:  1.  Negative   Original Report Authenticated By: Osa Craver, M.D.      1. Rib pain on left side       MDM  I have reviewed nursing notes, vital signs, and all appropriate lab and imaging results for this patient. Patient has a one-week history of left rib area pain on the lower ribs on the left. The x-ray of the ribs and chest is negative for fracture or dislocation or lung problems. The vital signs are well within normal limits. The pulse oximetry is 96% on room air. Test results were discussed with the patient in terms which he understands. The plan at this time is to use Tylenol with codeine #3 for pain and  soreness. Patient is to practice some coughing and deep breathing every hour. The patient is to see her primary physician or return to the emergency department if not improving.       Kathie Dike, [place] 08/22/12 1014

## 2012-08-22 NOTE — ED Provider Notes (Signed)
Medical screening examination/treatment/procedure(s) were conducted as a shared visit with non-physician practitioner(s) and myself.  I personally evaluated the patient during the encounter   Results for orders placed during the hospital encounter of 05/18/11  DIFFERENTIAL      Component Value Range   Neutrophils Relative 33 (*) 43 - 77 %   Neutro Abs 2.6  1.7 - 7.7 K/uL   Lymphocytes Relative 54 (*) 12 - 46 %   Lymphs Abs 4.3 (*) 0.7 - 4.0 K/uL   Monocytes Relative 9  3 - 12 %   Monocytes Absolute 0.7  0.1 - 1.0 K/uL   Eosinophils Relative 4  0 - 5 %   Eosinophils Absolute 0.3  0.0 - 0.7 K/uL   Basophils Relative 1  0 - 1 %   Basophils Absolute 0.1  0.0 - 0.1 K/uL  CBC      Component Value Range   WBC 7.9  4.0 - 10.5 K/uL   RBC 4.05  3.87 - 5.11 MIL/uL   Hemoglobin 12.0  12.0 - 15.0 g/dL   HCT 16.1  09.6 - 04.5 %   MCV 89.4  78.0 - 100.0 fL   MCH 29.6  26.0 - 34.0 pg   MCHC 33.1  30.0 - 36.0 g/dL   RDW 40.9  81.1 - 91.4 %   Platelets 254  150 - 400 K/uL  BASIC METABOLIC PANEL      Component Value Range   Sodium 141  135 - 145 mEq/L   Potassium 3.8  3.5 - 5.1 mEq/L   Chloride 104  96 - 112 mEq/L   CO2 29  19 - 32 mEq/L   Glucose, Bld 107 (*) 70 - 99 mg/dL   BUN 14  6 - 23 mg/dL   Creatinine, Ser 7.82  0.4 - 1.2 mg/dL   Calcium 9.8  8.4 - 95.6 mg/dL   GFR calc non Af Amer 59 (*) >60 mL/min   GFR calc Af Amer    >60 mL/min   Value: >60            The eGFR has been calculated     using the MDRD equation.     This calculation has not been     validated in all clinical     situations.     eGFR's persistently     <60 mL/min signify     possible Chronic Kidney Disease.   Dg Ribs Unilateral W/chest Left  08/22/2012  *RADIOLOGY REPORT*  Clinical Data: Pain and swelling  LEFT RIBS AND CHEST - 3+ VIEW  Comparison: 12/30/2011  Findings: No pneumothorax or effusion.  Lungs clear.  Heart size normal.  No displaced fracture or other focal lesion. Vascular clips in the right upper  abdomen.  IMPRESSION:  1.  Negative   Original Report Authenticated By: Osa Craver, M.D.    Patient with left-sided chest pain suggestive of rib pain x-ray with rib series shows no evidence of rib fracture or other lung abnormality. Will treat as if it is a chest wall type pain.    Shelda Jakes, MD 08/22/12 1007

## 2012-08-22 NOTE — ED Notes (Signed)
Patient transported to X-ray, ambulatory 

## 2012-08-22 NOTE — ED Notes (Signed)
Pt states left rib pain x 1 week and is getting worse. No known injury. States, "I just woke up one morning hurting." NAD.

## 2012-08-24 NOTE — ED Provider Notes (Deleted)
Medical screening examination/treatment/procedure(s) were performed by non-physician practitioner and as supervising physician I was immediately available for consultation/collaboration.   Shelda Jakes, MD 08/24/12 [phone]

## 2013-09-15 ENCOUNTER — Encounter: Payer: Self-pay | Admitting: Internal Medicine

## 2013-09-17 ENCOUNTER — Other Ambulatory Visit (HOSPITAL_COMMUNITY): Payer: Self-pay | Admitting: Family Medicine

## 2013-09-17 ENCOUNTER — Ambulatory Visit (HOSPITAL_COMMUNITY)
Admission: RE | Admit: 2013-09-17 | Discharge: 2013-09-17 | Disposition: A | Discharge status: Home / Self Care | Payer: Medicare Other | Source: Ambulatory Visit | Attending: Family Medicine | Admitting: Family Medicine

## 2013-09-17 DIAGNOSIS — R05 Cough: Secondary | ICD-10-CM

## 2013-09-17 DIAGNOSIS — F17200 Nicotine dependence, unspecified, uncomplicated: Secondary | ICD-10-CM | POA: Insufficient documentation

## 2013-09-17 DIAGNOSIS — R059 Cough, unspecified: Secondary | ICD-10-CM | POA: Insufficient documentation

## 2013-09-17 DIAGNOSIS — Z139 Encounter for screening, unspecified: Secondary | ICD-10-CM

## 2013-09-17 DIAGNOSIS — J449 Chronic obstructive pulmonary disease, unspecified: Secondary | ICD-10-CM | POA: Insufficient documentation

## 2013-09-17 DIAGNOSIS — M810 Age-related osteoporosis without current pathological fracture: Secondary | ICD-10-CM

## 2013-09-17 DIAGNOSIS — J4489 Other specified chronic obstructive pulmonary disease: Secondary | ICD-10-CM | POA: Insufficient documentation

## 2013-09-22 ENCOUNTER — Ambulatory Visit (HOSPITAL_COMMUNITY)
Admission: RE | Admit: 2013-09-22 | Discharge: 2013-09-22 | Disposition: A | Discharge status: Home / Self Care | Payer: Medicare Other | Source: Ambulatory Visit | Attending: Family Medicine | Admitting: Family Medicine

## 2013-09-22 ENCOUNTER — Other Ambulatory Visit (HOSPITAL_COMMUNITY): Payer: Self-pay | Admitting: Family Medicine

## 2013-09-22 DIAGNOSIS — F17200 Nicotine dependence, unspecified, uncomplicated: Secondary | ICD-10-CM

## 2013-09-22 DIAGNOSIS — R918 Other nonspecific abnormal finding of lung field: Secondary | ICD-10-CM | POA: Insufficient documentation

## 2013-09-22 DIAGNOSIS — R9389 Abnormal findings on diagnostic imaging of other specified body structures: Secondary | ICD-10-CM

## 2013-09-23 ENCOUNTER — Ambulatory Visit (HOSPITAL_COMMUNITY)
Admission: RE | Admit: 2013-09-23 | Discharge: 2013-09-23 | Disposition: A | Discharge status: Home / Self Care | Payer: Medicare Other | Source: Ambulatory Visit | Attending: Family Medicine | Admitting: Family Medicine

## 2013-09-23 DIAGNOSIS — Z780 Asymptomatic menopausal state: Secondary | ICD-10-CM | POA: Insufficient documentation

## 2013-09-23 DIAGNOSIS — M899 Disorder of bone, unspecified: Secondary | ICD-10-CM | POA: Insufficient documentation

## 2013-09-23 DIAGNOSIS — Z9079 Acquired absence of other genital organ(s): Secondary | ICD-10-CM | POA: Insufficient documentation

## 2013-09-23 DIAGNOSIS — Z1231 Encounter for screening mammogram for malignant neoplasm of breast: Secondary | ICD-10-CM | POA: Insufficient documentation

## 2013-09-23 DIAGNOSIS — Z139 Encounter for screening, unspecified: Secondary | ICD-10-CM

## 2013-09-23 DIAGNOSIS — M810 Age-related osteoporosis without current pathological fracture: Secondary | ICD-10-CM

## 2013-09-23 LAB — HM MAMMOGRAPHY

## 2014-08-02 DIAGNOSIS — J209 Acute bronchitis, unspecified: Secondary | ICD-10-CM | POA: Diagnosis not present

## 2014-08-02 DIAGNOSIS — J029 Acute pharyngitis, unspecified: Secondary | ICD-10-CM | POA: Diagnosis not present

## 2014-08-08 ENCOUNTER — Encounter (HOSPITAL_COMMUNITY): Payer: Self-pay | Admitting: Emergency Medicine

## 2014-08-08 ENCOUNTER — Emergency Department (HOSPITAL_COMMUNITY)
Admission: EM | Admit: 2014-08-08 | Discharge: 2014-08-08 | Disposition: A | Discharge status: Home / Self Care | Payer: Medicare Other | Attending: Emergency Medicine | Admitting: Emergency Medicine

## 2014-08-08 DIAGNOSIS — R079 Chest pain, unspecified: Secondary | ICD-10-CM | POA: Insufficient documentation

## 2014-08-08 DIAGNOSIS — F17200 Nicotine dependence, unspecified, uncomplicated: Secondary | ICD-10-CM | POA: Insufficient documentation

## 2014-08-08 DIAGNOSIS — IMO0002 Reserved for concepts with insufficient information to code with codable children: Secondary | ICD-10-CM | POA: Insufficient documentation

## 2014-08-08 DIAGNOSIS — J449 Chronic obstructive pulmonary disease, unspecified: Secondary | ICD-10-CM | POA: Insufficient documentation

## 2014-08-08 DIAGNOSIS — E860 Dehydration: Secondary | ICD-10-CM | POA: Diagnosis not present

## 2014-08-08 DIAGNOSIS — J4489 Other specified chronic obstructive pulmonary disease: Secondary | ICD-10-CM | POA: Insufficient documentation

## 2014-08-08 DIAGNOSIS — Z79899 Other long term (current) drug therapy: Secondary | ICD-10-CM | POA: Diagnosis not present

## 2014-08-08 DIAGNOSIS — H9209 Otalgia, unspecified ear: Secondary | ICD-10-CM | POA: Diagnosis not present

## 2014-08-08 DIAGNOSIS — H9202 Otalgia, left ear: Secondary | ICD-10-CM

## 2014-08-08 MED ORDER — FLUTICASONE PROPIONATE 50 MCG/ACT NA SUSP: 1.0000 | Freq: Every day | NASAL | Status: DC

## 2014-08-08 MED ORDER — SALINE SPRAY 0.65 % NA SOLN: 1.0000 | NASAL | Status: DC | PRN

## 2014-08-08 MED ORDER — ANTIPYRINE-BENZOCAINE 5.4-1.4 % OT SOLN
3.0000 [drp] | Freq: Once | OTIC | Status: AC
  Administered 2014-08-08: 4 [drp] via OTIC
  Filled 2014-08-08: qty 10

## 2014-08-08 MED ORDER — ANTIPYRINE-BENZOCAINE 5.4-1.4 % OT SOLN: 3.0000 [drp] | OTIC | Status: DC | PRN

## 2014-08-08 MED ORDER — FLUTICASONE PROPIONATE 50 MCG/ACT NA SUSP: 2.0000 | Freq: Every day | NASAL | Status: DC

## 2014-08-08 NOTE — Discharge Instructions (Signed)
Otalgia  The most common reason for this in children is an infection of the middle ear. Pain from the middle ear is usually caused by a build-up of fluid and pressure behind the eardrum. Pain from an earache can be sharp, dull, or burning. The pain may be temporary or constant. The middle ear is connected to the nasal passages by a short narrow tube called the Eustachian tube. The Eustachian tube allows fluid to drain out of the middle ear, and helps keep the pressure in your ear equalized.  CAUSES   A cold or allergy can block the Eustachian tube with inflammation and the build-up of secretions. This is especially likely in small children, because their Eustachian tube is shorter and more horizontal. When the Eustachian tube closes, the normal flow of fluid from the middle ear is stopped. Fluid can accumulate and cause stuffiness, pain, hearing loss, and an ear infection if germs start growing in this area.  SYMPTOMS   The symptoms of an ear infection may include fever, ear pain, fussiness, increased crying, and irritability. Many children will have temporary and minor hearing loss during and right after an ear infection. Permanent hearing loss is rare, but the risk increases the more infections a child has. Other causes of ear pain include retained water in the outer ear canal from swimming and bathing.  Ear pain in adults is less likely to be from an ear infection. Ear pain may be referred from other locations. Referred pain may be from the joint between your jaw and the skull. It may also come from a tooth problem or problems in the neck. Other causes of ear pain include:   A foreign body in the ear.   Outer ear infection.   Sinus infections.   Impacted ear wax.   Ear injury.   Arthritis of the jaw or TMJ problems.   Middle ear infection.   Tooth infections.   Sore throat with pain to the ears.  DIAGNOSIS   Your caregiver can usually make the diagnosis by examining you. Sometimes other special studies,  including x-rays and lab work may be necessary.  TREATMENT    If antibiotics were prescribed, use them as directed and finish them even if you or your child's symptoms seem to be improved.   Sometimes PE tubes are needed in children. These are little plastic tubes which are put into the eardrum during a simple surgical procedure. They allow fluid to drain easier and allow the pressure in the middle ear to equalize. This helps relieve the ear pain caused by pressure changes.  HOME CARE INSTRUCTIONS    Only take over-the-counter or prescription medicines for pain, discomfort, or fever as directed by your caregiver. DO NOT GIVE CHILDREN ASPIRIN because of the association of Reye's Syndrome in children taking aspirin.   Use a cold pack applied to the outer ear for 15-20 minutes, 03-04 times per day or as needed may reduce pain. Do not apply ice directly to the skin. You may cause frost bite.   Over-the-counter ear drops used as directed may be effective. Your caregiver may sometimes prescribe ear drops.   Resting in an upright position may help reduce pressure in the middle ear and relieve pain.   Ear pain caused by rapidly descending from high altitudes can be relieved by swallowing or chewing gum. Allowing infants to suck on a bottle during airplane travel can help.   Do not smoke in the house or near children. If you are   unable to quit smoking, smoke outside.   Control allergies.  SEEK IMMEDIATE MEDICAL CARE IF:    You or your child are becoming sicker.   Pain or fever relief is not obtained with medicine.   You or your child's symptoms (pain, fever, or irritability) do not improve within 24 to 48 hours or as instructed.   Severe pain suddenly stops hurting. This may indicate a ruptured eardrum.   You or your children develop new problems such as severe headaches, stiff neck, difficulty swallowing, or swelling of the face or around the ear.  Document Released: 07/26/2004 Document Revised: 03/02/2012  Document Reviewed: 11/30/2008  Exit[employer] Patient Information 2015 [employer]. This information is not intended to replace advice given to you by your health care provider. Make sure you discuss any questions you have with your health care provider.

## 2014-08-08 NOTE — ED Provider Notes (Signed)
CSN: [id number]     Arrival date & time 08/08/14  3382 History  This chart was scribed for Katelyn Hacker, MD by Ludger Nutting, ED Scribe. This patient was seen in room [facility] and the patient's care was started 9:04 AM.      Chief Complaint  Patient presents with  . Otalgia    HPI  HPI Comments: CALIANA SPIRES is a 73 y.o. female with past medical history of COPD who presents to the Emergency Department complaining of constant, gradually worsened left sided otalgia that began more than 10 days ago. She states she has been seen by PCP who prescribed her amoxicillin for a sinus infection. She reports being compliant with this medication without any relief. She describes the pain as stabbing and rates it as 8/10. She has also taken tylenol and motrin without relief. Patient also mentions having an episode of chest pressure a few days that was pressure like and lasted minutes, nonexertional. Patient states she has not been evaluated for this as she was not very concerned about it at the time. She denies fever, SOB, cough. She is a daily smoker. She has a family history (mother) of cardiac disease.    Past Medical History  Diagnosis Date  . Asthma   . COPD (chronic obstructive pulmonary disease)   . S/P colonoscopy 2009    dr. Gala Romney: anal papilla, 2 cecal AVMs. Due in 2014  . Dysphagia     requiring multiple dilations in past  . S/P endoscopy 2012    Dr. Gala Romney: mild chronic gastritis, bulbar erosions, s/p 54-French Maloney dilation   . S/P endoscopy 2003, 2004, 2007    gastritis, duodenitis, s/p dilation, +H.pylori    Past Surgical History  Procedure Laterality Date  . Appendectomy    . Inguinal hernia repair    . Tonsillectomy    . Total abdominal hysterectomy    . Cholecystectomy     Family History  Problem Relation Age of Onset  . Colon cancer Neg Hx    History  Substance Use Topics  . Smoking status: Current Every Day Smoker  . Smokeless tobacco: Not on file  . Alcohol  Use: No   OB History   Grav Para Term Preterm Abortions TAB SAB Ect Mult Living                 Review of Systems  Constitutional: Negative for fever.  HENT: Positive for ear pain and sinus pressure.   Respiratory: Negative for chest tightness and shortness of breath.   Cardiovascular: Positive for chest pain.  Gastrointestinal: Negative for nausea, vomiting and abdominal pain.  Genitourinary: Negative for dysuria.  Skin: Negative for rash.  Neurological: Negative for headaches.  All other systems reviewed and are negative.     Allergies  Review of patient's allergies indicates no known allergies.  Home Medications   Prior to Admission medications   Medication Sig Start Date End Date Taking? Authorizing Provider  ADVAIR DISKUS 250-50 MCG/DOSE AEPB Take 1 puff by mouth 2 (two) times daily.  03/21/11   Historical Provider, MD  albuterol (PROVENTIL HFA;VENTOLIN HFA) 108 (90 BASE) MCG/ACT inhaler Inhale 2 puffs into the lungs every 6 (six) hours as needed.    Historical Provider, MD  antipyrine-benzocaine Toniann Fail) otic solution Place 3-4 drops into the left ear every 2 (two) hours as needed for ear pain. 08/08/14   Katelyn Hacker, MD  fluticasone (FLONASE) 50 MCG/ACT nasal spray Place 2 sprays into both nostrils daily.  08/08/14   Katelyn Hacker, MD  loratadine (CLARITIN) 10 MG tablet Take 10 mg by mouth daily.     Historical Provider, MD  Multiple Vitamin (MULTIVITAMIN) capsule Take 1 capsule by mouth daily.      Historical Provider, MD  sodium chloride (OCEAN) 0.65 % SOLN nasal spray Place 1 spray into both nostrils as needed for congestion. 08/08/14   Katelyn Hacker, MD   BP 128/73  Pulse 78  Temp(Src) 98.2 F (36.8 C) (Oral)  Resp 16  Ht 5\' 4"  (1.626 m)  Wt 100 lb (45.36 kg)  BMI 17.16 kg/m2  SpO2 100% Physical Exam  Nursing note and vitals reviewed. Constitutional: She is oriented to person, place, and time. No distress.  thin  HENT:  Head: Normocephalic and  atraumatic.  Right Ear: External ear normal.  Left Ear: External ear normal.  Mouth/Throat: Oropharynx is clear and moist.  MM dry  Eyes: EOM are normal. Pupils are equal, round, and reactive to light.  Neck: Neck supple.  Cardiovascular: Normal rate, regular rhythm and normal heart sounds.   Pulmonary/Chest: Effort normal. No respiratory distress. She has no wheezes.  Abdominal: Soft. There is no tenderness.  Lymphadenopathy:    She has no cervical adenopathy.  Neurological: She is alert and oriented to person, place, and time.  Skin: Skin is warm and dry.  Psychiatric: She has a normal mood and affect.    ED Course  Procedures (including critical care time)  DIAGNOSTIC STUDIES: Oxygen Saturation is 100% on RA, normal by my interpretation.    COORDINATION OF CARE: 9:10 AM Discussed treatment plan with pt at bedside and pt agreed to plan.   Labs Review Labs Reviewed - No data to display  Imaging Review No results found.   EKG Interpretation   Date/Time:  Monday August 08 2014 09:29:16 EDT Ventricular Rate:  62 PR Interval:  151 QRS Duration: 101 QT Interval:  416 QTC Calculation: 422 R Axis:   -17 Text Interpretation:  Sinus rhythm Borderline left axis deviation  Confirmed by Mercedies Ganesh  MD, Jojo Geving ([id number]) on 08/08/2014 9:40:42 AM      MDM   Final diagnoses:  Otalgia, left    Patient presents with left otalgia. Recent history of sinus infection currently on amoxicillin. Denies fevers. Does report continued sinus pressure. Also reports self-limited episode several days ago of chest pain that lasted for minutes. No known coronary artery disease. Denies any pain since. Denies any shortness of breath.  Exam is reassuring. Bilateral TMs are clear with good light reflexes. Discuss with patient nasal saline and steroids to clear eustachian tubes. Patient will also be given auralgan.  Given the episode of chest pain, screening EKG obtained. No evidence of acute ischemia or Q  waves. Patient states she's not concerned about this at this time. We'll have her followup with her primary physician if pain recurs.  After history, exam, and medical workup I feel the patient has been appropriately medically screened and is safe for discharge home. Pertinent diagnoses were discussed with the patient. Patient was given return precautions.  I personally performed the services described in this documentation, which was scribed in my presence. The recorded information has been reviewed and is accurate.   Katelyn Hacker, MD 08/08/14 [phone]

## 2014-08-08 NOTE — ED Notes (Signed)
Pt alert & oriented x4, stable gait. Patient given discharge instructions, paperwork & prescription(s). Patient  instructed to stop at the registration desk to finish any additional paperwork. Patient verbalized understanding. Pt left department w/ no further questions. 

## 2014-08-08 NOTE — ED Notes (Signed)
Pt states she was recently treated for a sinus infection and has been on antbx x 10 days states she is still having left ear pain

## 2014-08-17 ENCOUNTER — Emergency Department (HOSPITAL_COMMUNITY): Payer: Medicare Other

## 2014-08-17 ENCOUNTER — Emergency Department (HOSPITAL_COMMUNITY)
Admission: EM | Admit: 2014-08-17 | Discharge: 2014-08-17 | Disposition: A | Discharge status: Home / Self Care | Payer: Medicare Other | Attending: Emergency Medicine | Admitting: Emergency Medicine

## 2014-08-17 ENCOUNTER — Encounter (HOSPITAL_COMMUNITY): Payer: Self-pay | Admitting: Emergency Medicine

## 2014-08-17 DIAGNOSIS — J45901 Unspecified asthma with (acute) exacerbation: Secondary | ICD-10-CM | POA: Diagnosis not present

## 2014-08-17 DIAGNOSIS — R110 Nausea: Secondary | ICD-10-CM | POA: Diagnosis not present

## 2014-08-17 DIAGNOSIS — J438 Other emphysema: Secondary | ICD-10-CM | POA: Diagnosis not present

## 2014-08-17 DIAGNOSIS — IMO0002 Reserved for concepts with insufficient information to code with codable children: Secondary | ICD-10-CM | POA: Insufficient documentation

## 2014-08-17 DIAGNOSIS — J441 Chronic obstructive pulmonary disease with (acute) exacerbation: Secondary | ICD-10-CM | POA: Insufficient documentation

## 2014-08-17 DIAGNOSIS — R079 Chest pain, unspecified: Secondary | ICD-10-CM | POA: Diagnosis not present

## 2014-08-17 DIAGNOSIS — Z79899 Other long term (current) drug therapy: Secondary | ICD-10-CM | POA: Diagnosis not present

## 2014-08-17 DIAGNOSIS — F17200 Nicotine dependence, unspecified, uncomplicated: Secondary | ICD-10-CM | POA: Diagnosis not present

## 2014-08-17 DIAGNOSIS — R9431 Abnormal electrocardiogram [ECG] [EKG]: Secondary | ICD-10-CM | POA: Diagnosis not present

## 2014-08-17 LAB — CBC WITH DIFFERENTIAL/PLATELET
Basophils Absolute: 0.1 10*3/uL (ref 0.0–0.1)
Basophils Relative: 1 % (ref 0–1)
Eosinophils Absolute: 0.1 10*3/uL (ref 0.0–0.7)
Eosinophils Relative: 2 % (ref 0–5)
HEMATOCRIT: 35.1 % — AB (ref 36.0–46.0)
HEMOGLOBIN: 12.2 g/dL (ref 12.0–15.0)
LYMPHS PCT: 37 % (ref 12–46)
Lymphs Abs: 2.4 10*3/uL (ref 0.7–4.0)
MCH: 30.3 pg (ref 26.0–34.0)
MCHC: 34.8 g/dL (ref 30.0–36.0)
MCV: 87.1 fL (ref 78.0–100.0)
MONOS PCT: 8 % (ref 3–12)
Monocytes Absolute: 0.6 10*3/uL (ref 0.1–1.0)
Neutro Abs: 3.4 10*3/uL (ref 1.7–7.7)
Neutrophils Relative %: 52 % (ref 43–77)
PLATELETS: 315 10*3/uL (ref 150–400)
RBC: 4.03 MIL/uL (ref 3.87–5.11)
RDW: 13.9 % (ref 11.5–15.5)
WBC: 6.6 10*3/uL (ref 4.0–10.5)

## 2014-08-17 LAB — BASIC METABOLIC PANEL
Anion gap: 13 (ref 5–15)
BUN: 13 mg/dL (ref 6–23)
CHLORIDE: 102 meq/L (ref 96–112)
CO2: 26 mEq/L (ref 19–32)
CREATININE: 0.81 mg/dL (ref 0.50–1.10)
Calcium: 9.4 mg/dL (ref 8.4–10.5)
GFR calc non Af Amer: 71 mL/min — ABNORMAL LOW (ref >90)
GFR, EST AFRICAN AMERICAN: 83 mL/min — AB (ref >90)
GLUCOSE: 121 mg/dL — AB (ref 70–99)
Potassium: 3.9 mEq/L (ref 3.7–5.3)
Sodium: 141 mEq/L (ref 137–147)

## 2014-08-17 LAB — TROPONIN I: Troponin I: <0.3 ng/mL (ref <0.30)

## 2014-08-17 MED ORDER — ALBUTEROL SULFATE (2.5 MG/3ML) 0.083% IN NEBU
2.5000 mg | INHALATION_SOLUTION | Freq: Once | RESPIRATORY_TRACT | Status: AC
  Administered 2014-08-17: 2.5 mg via RESPIRATORY_TRACT
  Filled 2014-08-17: qty 3

## 2014-08-17 MED ORDER — ALBUTEROL SULFATE HFA 108 (90 BASE) MCG/ACT IN AERS: 1.0000 | INHALATION_SPRAY | Freq: Four times a day (QID) | RESPIRATORY_TRACT | Status: DC | PRN

## 2014-08-17 MED ORDER — PREDNISONE 10 MG PO TABS: 20.0000 mg | ORAL_TABLET | Freq: Every day | ORAL | Status: DC

## 2014-08-17 MED ORDER — PREDNISONE 50 MG PO TABS
60.0000 mg | ORAL_TABLET | Freq: Once | ORAL | Status: AC
  Administered 2014-08-17: 60 mg via ORAL
  Filled 2014-08-17 (×2): qty 1

## 2014-08-17 NOTE — Discharge Instructions (Signed)
Chronic Obstructive Pulmonary Disease Exacerbation ° Chronic obstructive pulmonary disease (COPD) is a common lung problem. In COPD, the flow of air from the lungs is limited. COPD exacerbations are times that breathing gets worse and you need extra treatment. Without treatment they can be life threatening. If they happen often, your lungs can become more damaged. °HOME CARE °· Do not smoke. °· Avoid tobacco smoke and other things that bother your lungs. °· If given, take your antibiotic medicine as told. Finish the medicine even if you start to feel better. °· Only take medicines as told by your doctor. °· Drink enough fluids to keep your pee (urine) clear or pale yellow (unless your doctor has told you not to). °· Use a cool mist machine (vaporizer). °· If you use oxygen or a machine that turns liquid medicine into a mist (nebulizer), continue to use them as told. °· Keep up with shots (vaccinations) as told by your doctor. °· Exercise regularly. °· Eat healthy foods. °· Keep all doctor visits as told. °GET HELP RIGHT AWAY IF: °· You are very short of breath and it gets worse. °· You have trouble talking. °· You have bad chest pain. °· You have blood in your spit (sputum). °· You have a fever. °· You keep throwing up (vomiting). °· You feel weak, or you pass out (faint). °· You feel confused. °· You keep getting worse. °MAKE SURE YOU:  °· Understand these instructions. °· Will watch your condition. °· Will get help right away if you are not doing well or get worse. °Document Released: 11/28/2011 Document Revised: 09/29/2013 Document Reviewed: 08/13/2013 °ExitCare® Patient Information ©2015 [employer]. This information is not intended to replace advice given to you by your health care provider. Make sure you discuss any questions you have with your health care provider. ° °

## 2014-08-17 NOTE — ED Notes (Signed)
PT c/o tightness in chest x2 days with SOB and generalized weakness.

## 2014-08-17 NOTE — ED Notes (Signed)
MD at bedside. 

## 2014-08-17 NOTE — ED Provider Notes (Signed)
CSN: [id number]     Arrival date & time 08/17/14  1650 History  This chart was scribed for Tanna Furry, MD by Randa Evens, ED Scribe. This patient was seen in room APA02/APA02 and the patient's care was started at 5:27 PM.   Chief Complaint  Patient presents with  . Chest Pain   HPI HPI Comments: Katelyn [name] is a 73 y.o. female who presents to the Emergency Department complaining of intermittent Chest tightness onset 4 days prior. She states she had associated nausea, and SOB. She states that she feels blood rushing to her legs. She states she used her rescue inhaler with no relief.  She states that since she has been in ED her symptoms of chest tightness have improved. She states that she relates her her symptoms to stress for the past 2 months. She states she has a HX of COPD and asthma. She states she has a Hx if irregular heart beat and She states that she is still a current every day smoker. She denies leg swelling.   Past Medical History  Diagnosis Date  . Asthma   . COPD (chronic obstructive pulmonary disease)   . S/P colonoscopy 2009    dr. Gala Romney: anal papilla, 2 cecal AVMs. Due in 2014  . Dysphagia     requiring multiple dilations in past  . S/P endoscopy 2012    Dr. Gala Romney: mild chronic gastritis, bulbar erosions, s/p 54-French Maloney dilation   . S/P endoscopy 2003, 2004, 2007    gastritis, duodenitis, s/p dilation, +H.pylori    Past Surgical History  Procedure Laterality Date  . Appendectomy    . Inguinal hernia repair    . Tonsillectomy    . Total abdominal hysterectomy    . Cholecystectomy     Family History  Problem Relation Age of Onset  . Colon cancer Neg Hx    History  Substance Use Topics  . Smoking status: Current Every Day Smoker -- 1.00 packs/day  . Smokeless tobacco: Not on file  . Alcohol Use: No   OB History   Grav Para Term Preterm Abortions TAB SAB Ect Mult Living                 Review of Systems  Constitutional: Negative for fever,  chills, diaphoresis, appetite change and fatigue.  HENT: Negative for mouth sores, sore throat and trouble swallowing.   Eyes: Negative for visual disturbance.  Respiratory: Positive for chest tightness and shortness of breath. Negative for cough and wheezing.   Cardiovascular: Negative for leg swelling.  Gastrointestinal: Positive for nausea. Negative for vomiting, abdominal pain, diarrhea and abdominal distention.  Endocrine: Negative for polydipsia, polyphagia and polyuria.  Genitourinary: Negative for dysuria, frequency and hematuria.  Musculoskeletal: Negative for gait problem.  Skin: Negative for color change, pallor and rash.  Neurological: Negative for dizziness, syncope, light-headedness and headaches.  Hematological: Does not bruise/bleed easily.  Psychiatric/Behavioral: Negative for behavioral problems and confusion.    Allergies  Review of patient's allergies indicates no known allergies.  Home Medications   Prior to Admission medications   Medication Sig Start Date End Date Taking? Authorizing Provider  ADVAIR DISKUS 250-50 MCG/DOSE AEPB Take 1 puff by mouth 2 (two) times daily.  03/21/11  Yes Historical Provider, MD  fluticasone (FLONASE) 50 MCG/ACT nasal spray Place 2 sprays into both nostrils daily. 08/08/14  Yes Merryl Hacker, MD  Multiple Vitamin (MULTIVITAMIN) capsule Take 1 capsule by mouth daily.     Yes Historical Provider,  MD  sodium chloride (OCEAN) 0.65 % SOLN nasal spray Place 1 spray into both nostrils as needed for congestion. 08/08/14  Yes Merryl Hacker, MD  albuterol (PROVENTIL HFA;VENTOLIN HFA) 108 (90 BASE) MCG/ACT inhaler Inhale 2 puffs into the lungs every 6 (six) hours as needed for wheezing or shortness of breath.     Historical Provider, MD  albuterol (PROVENTIL HFA;VENTOLIN HFA) 108 (90 BASE) MCG/ACT inhaler Inhale 1-2 puffs into the lungs every 6 (six) hours as needed for wheezing. 08/17/14   Tanna Furry, MD  loratadine (CLARITIN) 10 MG tablet  Take 10 mg by mouth daily.     Historical Provider, MD  predniSONE (DELTASONE) 10 MG tablet Take 2 tablets (20 mg total) by mouth daily. 08/17/14   Tanna Furry, MD   Triage Vitals: BP 128/64  Pulse 66  Temp(Src) 98.3 F (36.8 C) (Oral)  Resp 18  Ht 5\' 4"  (1.626 m)  Wt 95 lb (43.092 kg)  BMI 16.30 kg/m2  SpO2 99%  Physical Exam  Constitutional: She is oriented to person, place, and time. She appears well-developed and well-nourished. No distress.  HENT:  Head: Normocephalic.  Eyes: Conjunctivae are normal. Pupils are equal, round, and reactive to light. No scleral icterus.  Neck: Normal range of motion. Neck supple. No JVD present. No thyromegaly present.  Cardiovascular: Normal rate and regular rhythm.  Exam reveals no gallop and no friction rub.   No murmur heard. Pulmonary/Chest: Effort normal. No respiratory distress. She has wheezes. She has no rales.  Faint end expiratory wheeze  Abdominal: Soft. Bowel sounds are normal. She exhibits no distension. There is no tenderness. There is no rebound.  Musculoskeletal: Normal range of motion.  Neurological: She is alert and oriented to person, place, and time.  Skin: Skin is warm and dry. No rash noted.  Psychiatric: She has a normal mood and affect. Her behavior is normal.    ED Course  Procedures (including critical care time) DIAGNOSTIC STUDIES: Oxygen Saturation is 99% on RA, normal by my interpretation.    COORDINATION OF CARE: 6:03 PM-Discussed treatment plan which includes breathing treatment and prednisone with pt at bedside and pt agreed to plan.     Labs Review Labs Reviewed  BASIC METABOLIC PANEL - Abnormal; Notable for the following:    Glucose, Bld 121 (*)    GFR calc non Af Amer 71 (*)    GFR calc Af Amer 83 (*)    All other components within normal limits  CBC WITH DIFFERENTIAL - Abnormal; Notable for the following:    HCT 35.1 (*)    All other components within normal limits  TROPONIN I    Imaging  Review Dg Chest 2 View  08/17/2014   CLINICAL DATA:  Chest and epigastric pain.  COPD.  EXAM: CHEST  2 VIEW  COMPARISON:  09/17/2013  FINDINGS: Two view exam shows hyperexpansion without edema or focal airspace consolidation. No pleural effusion. Right greater than left biapical pleural parenchymal scarring is stable. The cardiopericardial silhouette is within normal limits for size. Imaged bony structures of the thorax are intact.  IMPRESSION: Stable.  Emphysema without acute cardiopulmonary findings.   Electronically Signed   By: Misty Stanley M.D.   On: 08/17/2014 17:51     EKG Interpretation   Date/Time:  Wednesday August 17 2014 16:58:00 EDT Ventricular Rate:  67 PR Interval:  130 QRS Duration: 86 QT Interval:  398 QTC Calculation: 420 R Axis:   31 Text Interpretation:  Normal sinus rhythm  Septal infarct , age  undetermined Abnormal ECG Confirmed by Jeneen Rinks  MD, [place] ([id number]) on  08/17/2014 6:03:09 PM      MDM   Final diagnoses:  COPD exacerbation   Pt with minimal wheezing.  Normal EKG and Troponin.  Main c/o is tightness with breathing.  DC with prednisone, encouraged to use albuterol MDI as needed.     I personally performed the services described in this documentation, which was scribed in my presence. The recorded information has been reviewed and is accurate.      Tanna Furry, MD 08/17/14 [phone]

## 2014-10-03 ENCOUNTER — Other Ambulatory Visit (HOSPITAL_COMMUNITY): Payer: Self-pay | Admitting: Family Medicine

## 2014-10-03 ENCOUNTER — Ambulatory Visit (HOSPITAL_COMMUNITY)
Admission: RE | Admit: 2014-10-03 | Discharge: 2014-10-03 | Disposition: A | Discharge status: Home / Self Care | Payer: Medicare Other | Source: Ambulatory Visit | Attending: Family Medicine | Admitting: Family Medicine

## 2014-10-03 DIAGNOSIS — M25522 Pain in left elbow: Secondary | ICD-10-CM | POA: Insufficient documentation

## 2014-10-03 DIAGNOSIS — J449 Chronic obstructive pulmonary disease, unspecified: Secondary | ICD-10-CM

## 2014-10-03 DIAGNOSIS — G8929 Other chronic pain: Secondary | ICD-10-CM | POA: Insufficient documentation

## 2014-10-03 DIAGNOSIS — J209 Acute bronchitis, unspecified: Secondary | ICD-10-CM | POA: Diagnosis not present

## 2014-12-05 DIAGNOSIS — X32XXXD Exposure to sunlight, subsequent encounter: Secondary | ICD-10-CM | POA: Diagnosis not present

## 2014-12-05 DIAGNOSIS — D225 Melanocytic nevi of trunk: Secondary | ICD-10-CM | POA: Diagnosis not present

## 2014-12-05 DIAGNOSIS — L570 Actinic keratosis: Secondary | ICD-10-CM | POA: Diagnosis not present

## 2014-12-18 ENCOUNTER — Emergency Department (HOSPITAL_COMMUNITY)
Admission: EM | Admit: 2014-12-18 | Discharge: 2014-12-18 | Disposition: A | Discharge status: Home / Self Care | Payer: Medicare Other | Attending: Emergency Medicine | Admitting: Emergency Medicine

## 2014-12-18 ENCOUNTER — Encounter (HOSPITAL_COMMUNITY): Payer: Self-pay | Admitting: Emergency Medicine

## 2014-12-18 ENCOUNTER — Other Ambulatory Visit (HOSPITAL_COMMUNITY): Payer: Self-pay | Admitting: Nurse Practitioner

## 2014-12-18 ENCOUNTER — Emergency Department (HOSPITAL_COMMUNITY): Payer: Medicare Other

## 2014-12-18 DIAGNOSIS — R202 Paresthesia of skin: Secondary | ICD-10-CM | POA: Insufficient documentation

## 2014-12-18 DIAGNOSIS — Z79899 Other long term (current) drug therapy: Secondary | ICD-10-CM | POA: Insufficient documentation

## 2014-12-18 DIAGNOSIS — Z7951 Long term (current) use of inhaled steroids: Secondary | ICD-10-CM | POA: Diagnosis not present

## 2014-12-18 DIAGNOSIS — M25562 Pain in left knee: Secondary | ICD-10-CM | POA: Diagnosis not present

## 2014-12-18 DIAGNOSIS — M79605 Pain in left leg: Secondary | ICD-10-CM | POA: Diagnosis present

## 2014-12-18 DIAGNOSIS — M79662 Pain in left lower leg: Secondary | ICD-10-CM

## 2014-12-18 DIAGNOSIS — J449 Chronic obstructive pulmonary disease, unspecified: Secondary | ICD-10-CM | POA: Diagnosis not present

## 2014-12-18 DIAGNOSIS — Z720 Tobacco use: Secondary | ICD-10-CM | POA: Insufficient documentation

## 2014-12-18 DIAGNOSIS — M1712 Unilateral primary osteoarthritis, left knee: Secondary | ICD-10-CM | POA: Diagnosis not present

## 2014-12-18 NOTE — ED Provider Notes (Signed)
CSN: [id number]     Arrival date & time 12/18/14  1256 History  This chart was scribed for [facility], NP, working with Veryl Speak, MD by Steva Colder, ED Scribe. The patient was seen in room APFT20/APFT20 at 3:18 PM.   Chief Complaint  Patient presents with  . Leg Pain      Patient is a 73 y.o. female presenting with leg pain. The history is provided by the patient. No language interpreter was used.  Leg Pain Location:  Leg Time since incident:  2 days Injury: no   Leg location:  L lower leg Pain details:    Quality:  Tingling   Radiates to:  Does not radiate   Severity:  Moderate   Onset quality:  Sudden   Duration:  2 days   Timing:  Constant   Progression:  Unchanged Chronicity:  New Prior injury to area:  No   HPI Comments: Katelyn [name] is a 73 y.o. female who presents to the Emergency Department complaining of lower left leg pain onset 2 days. She reports that she felt a knot behind her knee this morning and it felt like it burst causing a pain down her leg and that is why she came into the ED. She states that she is having associated symptoms of tingling. She denies any other associated symptoms. She denies taking blood thinners and she only takes Advair for her asthma. She has a medical hx of COPD.   Past Medical History  Diagnosis Date  . Asthma   . COPD (chronic obstructive pulmonary disease)   . S/P colonoscopy 2009    dr. Gala Romney: anal papilla, 2 cecal AVMs. Due in 2014  . Dysphagia     requiring multiple dilations in past  . S/P endoscopy 2012    Dr. Gala Romney: mild chronic gastritis, bulbar erosions, s/p 54-French Maloney dilation   . S/P endoscopy 2003, 2004, 2007    gastritis, duodenitis, s/p dilation, +H.pylori    Past Surgical History  Procedure Laterality Date  . Appendectomy    . Inguinal hernia repair    . Tonsillectomy    . Total abdominal hysterectomy    . Cholecystectomy     Family History  Problem Relation Age of Onset  . Colon cancer Neg  Hx    History  Substance Use Topics  . Smoking status: Current Every Day Smoker -- 1.00 packs/day  . Smokeless tobacco: Not on file  . Alcohol Use: No   OB History    No data available     Review of Systems  Respiratory: Negative for shortness of breath.   Cardiovascular: Negative for chest pain.  Musculoskeletal: Positive for arthralgias. Negative for joint swelling.       Left knee and lower leg pain  Skin: Negative for color change.  All other systems reviewed and are negative.     Allergies  Review of patient's allergies indicates no known allergies.  Home Medications   Prior to Admission medications   Medication Sig Start Date End Date Taking? Authorizing Provider  ADVAIR DISKUS 250-50 MCG/DOSE AEPB Take 1 puff by mouth 2 (two) times daily.  03/21/11   Historical Provider, MD  albuterol (PROVENTIL HFA;VENTOLIN HFA) 108 (90 BASE) MCG/ACT inhaler Inhale 2 puffs into the lungs every 6 (six) hours as needed for wheezing or shortness of breath.     Historical Provider, MD  albuterol (PROVENTIL HFA;VENTOLIN HFA) 108 (90 BASE) MCG/ACT inhaler Inhale 1-2 puffs into the lungs every 6 (six)  hours as needed for wheezing. 08/17/14   Tanna Furry, MD  fluticasone [facility]) 50 MCG/ACT nasal spray Place 2 sprays into both nostrils daily. 08/08/14   Merryl Hacker, MD  loratadine (CLARITIN) 10 MG tablet Take 10 mg by mouth daily.     Historical Provider, MD  Multiple Vitamin (MULTIVITAMIN) capsule Take 1 capsule by mouth daily.      Historical Provider, MD  predniSONE (DELTASONE) 10 MG tablet Take 2 tablets (20 mg total) by mouth daily. 08/17/14   Tanna Furry, MD  sodium chloride (OCEAN) 0.65 % SOLN nasal spray Place 1 spray into both nostrils as needed for congestion. 08/08/14   Merryl Hacker, MD   BP 122/74 mmHg  Pulse 91  Temp(Src) 98.6 F (37 C) (Oral)  Resp 24  Ht 5\' 4"  (1.626 m)  Wt 98 lb (44.453 kg)  BMI 16.81 kg/m2  SpO2 100% Physical Exam  Constitutional: She is oriented  to person, place, and time. She appears well-developed and well-nourished.  HENT:  Head: Normocephalic and atraumatic.  Eyes: EOM are normal.  Neck: Neck supple.  Cardiovascular: Normal rate and normal pulses.   Pulmonary/Chest: Effort normal.  Musculoskeletal: Normal range of motion.       Left knee: She exhibits normal range of motion, no swelling, no effusion, no ecchymosis, no deformity, no laceration, no erythema and normal alignment.  There is only minimal tenderness of the lateral collateral ligament with full range of motion. No calf tenderness. Pedal pulses equal, adequate circulation.   Neurological: She is alert and oriented to person, place, and time. She has normal strength. No cranial nerve deficit or sensory deficit. Gait normal.  Skin: Skin is warm and dry.  Psychiatric: She has a normal mood and affect. Her behavior is normal.  Nursing note and vitals reviewed.  Dr. Stark Jock in to examine the patient and we will schedule appointment for the patient to return tomorrow for ultrasound of left lower extremity. He does not feel Lovenox is indicated at this time.   ED Course  Procedures (including critical care time) DIAGNOSTIC STUDIES: Oxygen Saturation is 100% on room air, normal by my interpretation.    COORDINATION OF CARE: 3:19 PM-Discussed treatment plan which includes Left knee x-ray with pt at bedside and pt agreed to plan.   Imaging Review Dg Knee Complete 4 Views Left  12/18/2014   CLINICAL DATA:  Acute left knee pain  EXAM: LEFT KNEE - COMPLETE 4+ VIEW  COMPARISON:  None.  FINDINGS: Four views of the left knee submitted. No acute fracture or subluxation. No joint effusion. Mild narrowing of medial joint compartment. Mild narrowing of patellofemoral joint space.  IMPRESSION: No acute fracture or subluxation.  Minimal osteoarthritic changes.   Electronically Signed   By: Lahoma Crocker M.D.   On: 12/18/2014 15:41     MDM  73 y.o. female with left knee and calf pain that  started early this am and has mostly resolved since then. She is concerned about a DVT. Stable for discharge  Without calf pain or any neurovascular deficits. Return tomorrow for DVT study.   Final diagnoses:  Knee pain, acute, left   I personally performed the services described in this documentation, which was scribed in my presence. The recorded information has been reviewed and is accurate.    [address] [place], NP 12/18/14 [place], MD 12/18/14 [phone]

## 2014-12-18 NOTE — Discharge Instructions (Signed)
Return tomorrow for ultrasound of your left lower leg to make sure you do not have a blood clot.

## 2014-12-18 NOTE — ED Notes (Signed)
PT c/o lower left leg pain and tingling x2 days. PT denies any injury. No redness or swelling noted to left lower leg. PT ambulatory in triage with NAD noted. PT denies any CP or increasing SOB.

## 2014-12-19 ENCOUNTER — Ambulatory Visit (HOSPITAL_COMMUNITY)
Admit: 2014-12-19 | Discharge: 2014-12-19 | Disposition: A | Discharge status: Home / Self Care | Payer: Medicare Other | Source: Ambulatory Visit | Attending: Emergency Medicine | Admitting: Emergency Medicine

## 2014-12-19 DIAGNOSIS — M79605 Pain in left leg: Secondary | ICD-10-CM | POA: Insufficient documentation

## 2014-12-19 DIAGNOSIS — M79662 Pain in left lower leg: Secondary | ICD-10-CM

## 2014-12-19 DIAGNOSIS — R202 Paresthesia of skin: Secondary | ICD-10-CM | POA: Diagnosis not present

## 2014-12-19 NOTE — Consult Note (Signed)
2:48 PM  Pt had a venous doppler today that was negative.  Discussed with pt.  Will dc home with outpt follow up.

## 2014-12-29 DIAGNOSIS — J069 Acute upper respiratory infection, unspecified: Secondary | ICD-10-CM | POA: Diagnosis not present

## 2014-12-29 DIAGNOSIS — J209 Acute bronchitis, unspecified: Secondary | ICD-10-CM | POA: Diagnosis not present

## 2015-01-10 ENCOUNTER — Encounter: Payer: Self-pay | Admitting: Cardiology

## 2015-01-10 ENCOUNTER — Ambulatory Visit (INDEPENDENT_AMBULATORY_CARE_PROVIDER_SITE_OTHER): Payer: Medicare Other | Admitting: Cardiology

## 2015-01-10 VITALS — BP 106/64 | HR 61 | Ht 64.0 in | Wt 99.0 lb

## 2015-01-10 DIAGNOSIS — R001 Bradycardia, unspecified: Secondary | ICD-10-CM

## 2015-01-10 DIAGNOSIS — R002 Palpitations: Secondary | ICD-10-CM | POA: Diagnosis not present

## 2015-01-10 DIAGNOSIS — R0789 Other chest pain: Secondary | ICD-10-CM

## 2015-01-10 NOTE — Progress Notes (Addendum)
Clinical Summary Ms. Peedin is a 74 y.o.female seen today as a new patient for chest pain.  1. Chest pain/Palpitations - started approx 2 weeks. Pressure midchest 4/10. Can occur at rest or with exertion. +palpitations. Can feel lightheaded. No other symptoms. Variable frequency, but increase in frequency over the last few weeks. Some increase in severity. Lasts for just a few minutes, but can occur multiple times a day. Not postional. , - no specific pattern.  - no new DOE, no LE edema, no orthopnea  CAD risk factors: + tobacco, age, mother heart troubles 89s, brother MIs 19s    Past Medical History  Diagnosis Date  . Asthma   . COPD (chronic obstructive pulmonary disease)   . S/P colonoscopy 2009    dr. Gala Romney: anal papilla, 2 cecal AVMs. Due in 2014  . Dysphagia     requiring multiple dilations in past  . S/P endoscopy 2012    Dr. Gala Romney: mild chronic gastritis, bulbar erosions, s/p 54-French Maloney dilation   . S/P endoscopy 2003, 2004, 2007    gastritis, duodenitis, s/p dilation, +H.pylori      No Known Allergies   Current Outpatient Prescriptions  Medication Sig Dispense Refill  . ADVAIR DISKUS 250-50 MCG/DOSE AEPB Take 1 puff by mouth 2 (two) times daily.     Marland Kitchen albuterol (PROVENTIL HFA;VENTOLIN HFA) 108 (90 BASE) MCG/ACT inhaler Inhale 2 puffs into the lungs every 6 (six) hours as needed for wheezing or shortness of breath.     Marland Kitchen albuterol (PROVENTIL HFA;VENTOLIN HFA) 108 (90 BASE) MCG/ACT inhaler Inhale 1-2 puffs into the lungs every 6 (six) hours as needed for wheezing. 1 Inhaler 0  . fluticasone (FLONASE) 50 MCG/ACT nasal spray Place 2 sprays into both nostrils daily. 16 g 0  . loratadine (CLARITIN) 10 MG tablet Take 10 mg by mouth daily.     . Multiple Vitamin (MULTIVITAMIN) capsule Take 1 capsule by mouth daily.      . predniSONE (DELTASONE) 10 MG tablet Take 2 tablets (20 mg total) by mouth daily. 10 tablet 0  . sodium chloride (OCEAN) 0.65 % SOLN nasal  spray Place 1 spray into both nostrils as needed for congestion. 30 mL 0   No current facility-administered medications for this visit.     Past Surgical History  Procedure Laterality Date  . Appendectomy    . Inguinal hernia repair    . Tonsillectomy    . Total abdominal hysterectomy    . Cholecystectomy       No Known Allergies    Family History  Problem Relation Age of Onset  . Colon cancer Neg Hx      Social History Ms. Essner reports that she has been smoking.  She does not have any smokeless tobacco history on file. Ms. Deanda reports that she does not drink alcohol.   Review of Systems CONSTITUTIONAL: No weight loss, fever, chills, weakness or fatigue.  HEENT: Eyes: No visual loss, blurred vision, double vision or yellow sclerae.No hearing loss, sneezing, congestion, runny nose or sore throat.  SKIN: No rash or itching.  CARDIOVASCULAR: per HPI RESPIRATORY: No shortness of breath, cough or sputum.  GASTROINTESTINAL: No anorexia, nausea, vomiting or diarrhea. No abdominal pain or blood.  GENITOURINARY: No burning on urination, no polyuria NEUROLOGICAL: No headache, dizziness, syncope, paralysis, ataxia, numbness or tingling in the extremities. No change in bowel or bladder control.  MUSCULOSKELETAL: No muscle, back pain, joint pain or stiffness.  LYMPHATICS: No enlarged nodes. No  history of splenectomy.  PSYCHIATRIC: No history of depression or anxiety.  ENDOCRINOLOGIC: No reports of sweating, cold or heat intolerance. No polyuria or polydipsia.  Marland Kitchen   Physical Examination p 69 bp 106/64 Wt 99 lbs BMI 17 Gen: resting comfortably, no acute distress HEENT: no scleral icterus, pupils equal round and reactive, no palptable cervical adenopathy,  CV: RRR, no m/r/g, no JVD Resp: Clear to auscultation bilaterally GI: abdomen is soft, non-tender, non-distended, normal bowel sounds, no hepatosplenomegaly MSK: extremities are warm, no edema.  Skin: warm, no  rash Neuro:  no focal deficits Psych: appropriate affect   Diagnostic Studies Clinic EKG Sinus bradycardia    Assessment and Plan   1. Chest pain - unclear etiology, multiple CAD risk factors. Will obtain a GXT to evaluate for ischemia. With palpitatoins will also monitor for exercise induced arrhythmia. If stress normal may need home monitor for further arrhythmia workup   2. Sinus bradycardia - EKG sinus brady to 50, no symptoms - follow chronotropic response with exercise.    F/u 1 month  Arnoldo Lenis, M.D.

## 2015-01-10 NOTE — Patient Instructions (Addendum)
Your physician recommends that you schedule a follow-up appointment in: 1 month with Dr.Branch    Your physician recommends that you continue on your current medications as directed. Please refer to the Current Medication list given to you today.    Your physician has requested that you have an exercise tolerance test. For further information please visit [email or web address]. Please also follow instruction sheet, as given.   Please bring your Advair and Albuterol inhalers with you for your stress test     Thank you for choosing [facility] !

## 2015-01-12 NOTE — Addendum Note (Signed)
Addended by: Levonne Hubert on: 01/12/2015 11:20 AM   Modules accepted: Orders

## 2015-01-18 ENCOUNTER — Encounter (HOSPITAL_COMMUNITY): Payer: Self-pay

## 2015-01-18 ENCOUNTER — Encounter: Payer: Self-pay | Admitting: *Deleted

## 2015-01-18 ENCOUNTER — Ambulatory Visit (HOSPITAL_COMMUNITY)
Admission: RE | Admit: 2015-01-18 | Discharge: 2015-01-18 | Disposition: A | Discharge status: Home / Self Care | Payer: Medicare Other | Source: Ambulatory Visit | Attending: Cardiology | Admitting: Cardiology

## 2015-01-18 ENCOUNTER — Telehealth: Payer: Self-pay | Admitting: *Deleted

## 2015-01-18 DIAGNOSIS — R079 Chest pain, unspecified: Secondary | ICD-10-CM

## 2015-01-18 NOTE — Telephone Encounter (Signed)
-----   Message from Arnoldo Lenis, MD sent at 01/18/2015  3:14 PM EST ----- Stress test with mixed/non-definitive results. Also concern that she reportedly had some chest pain during the test. She needs more detailed stress test, please order a [place] for her and notify her   [name] MD

## 2015-01-18 NOTE — Telephone Encounter (Signed)
Patient is scheduled for Feb. 3 at 0700. Attempted to call patient unable to reach by phone, unable to leave a message. Will try again later.

## 2015-01-18 NOTE — Progress Notes (Addendum)
Stress Lab Nurses Notes - Katelyn [name] 01/18/2015 Reason for doing test: Chest Pain Type of test: Regular GTX Nurse performing test: Katelyn Halls, RN Nuclear Medicine Tech: Not Applicable Echo Tech: Not Applicable MD performing test: [name] PA Family MD: [name] Test explained and consent signed: Yes.   IV started: No IV started Symptoms: SOB Treatment/Intervention: None Reason test stopped: fatigue, SOB and had chest pressure at the end (stage 3) After recovery IV was: NA Patient to return to Nuc. Med at : NA Patient discharged: Home Patient's Condition upon discharge was: stable Comments: During test peak BP 140/70 & HR 139.  Recovery BP 118/74 & HR 75.  Symptoms resolved in recovery. [name]    Patient exercised according to the Bruce protocol for 7 min 22 seconds achieving 10.10 METs. Baseline HR of 66 bpm rose to max of 139 bpm (93% of THR) and bp increased from 108/74 up to 140/70. The test was stopped due to lightheadness, fatigue,and mild chest pressure. Baseline EKG showed NSR. Stress EKG interpretation significantly affected by artifact and baseline wander. Non-specific ST/T changes in inferior limb and lateral precordial leads.   1. Nondiagnostic stress EKG, EKG interpretation affected by significant artifact and baseline wander. Cannot accurately calculate Duke score based on available EKG tracings.  2. Dizziness and chest pain with stress of unclear etiology 3. Consider stress imaging to further evaluate possible cardiac etiology of chest pain.    [name] MD

## 2015-01-19 ENCOUNTER — Telehealth: Payer: Self-pay | Admitting: *Deleted

## 2015-01-19 NOTE — Telephone Encounter (Signed)
Patient made aware of Lexi scan date and time. Patient voiced understanding. All questions answered.

## 2015-01-23 ENCOUNTER — Other Ambulatory Visit: Payer: Self-pay

## 2015-01-23 DIAGNOSIS — R072 Precordial pain: Secondary | ICD-10-CM

## 2015-01-25 ENCOUNTER — Ambulatory Visit (HOSPITAL_COMMUNITY)
Admission: RE | Admit: 2015-01-25 | Discharge: 2015-01-25 | Disposition: A | Discharge status: Home / Self Care | Payer: Medicare Other | Source: Ambulatory Visit | Attending: Cardiology | Admitting: Cardiology

## 2015-01-25 ENCOUNTER — Encounter (HOSPITAL_COMMUNITY): Payer: Self-pay

## 2015-01-25 ENCOUNTER — Encounter (HOSPITAL_COMMUNITY)
Admission: RE | Admit: 2015-01-25 | Discharge: 2015-01-25 | Disposition: A | Discharge status: Home / Self Care | Payer: Medicare Other | Source: Ambulatory Visit | Attending: Cardiology | Admitting: Cardiology

## 2015-01-25 DIAGNOSIS — R079 Chest pain, unspecified: Secondary | ICD-10-CM | POA: Diagnosis not present

## 2015-01-25 DIAGNOSIS — R002 Palpitations: Secondary | ICD-10-CM | POA: Diagnosis not present

## 2015-01-25 DIAGNOSIS — Z720 Tobacco use: Secondary | ICD-10-CM | POA: Insufficient documentation

## 2015-01-25 DIAGNOSIS — Z8249 Family history of ischemic heart disease and other diseases of the circulatory system: Secondary | ICD-10-CM | POA: Diagnosis not present

## 2015-01-25 DIAGNOSIS — R072 Precordial pain: Secondary | ICD-10-CM

## 2015-01-25 MED ORDER — REGADENOSON 0.4 MG/5ML IV SOLN
INTRAVENOUS | Status: AC
  Administered 2015-01-25: 0.4 mg via INTRAVENOUS
  Filled 2015-01-25: qty 5

## 2015-01-25 MED ORDER — TECHNETIUM TC 99M SESTAMIBI - CARDIOLITE
10.0000 | Freq: Once | INTRAVENOUS | Status: AC | PRN
  Administered 2015-01-25: 07:00:00 10 via INTRAVENOUS

## 2015-01-25 MED ORDER — SODIUM CHLORIDE 0.9 % IJ SOLN
10.0000 mL | INTRAMUSCULAR | Status: DC | PRN
  Administered 2015-01-25: 10 mL via INTRAVENOUS
  Filled 2015-01-25: qty 10

## 2015-01-25 MED ORDER — TECHNETIUM TC 99M SESTAMIBI GENERIC - CARDIOLITE
30.0000 | Freq: Once | INTRAVENOUS | Status: AC | PRN
  Administered 2015-01-25: 30 via INTRAVENOUS

## 2015-01-25 MED ORDER — SODIUM CHLORIDE 0.9 % IJ SOLN
INTRAMUSCULAR | Status: AC
  Administered 2015-01-25: 10 mL via INTRAVENOUS
  Filled 2015-01-25: qty 3

## 2015-01-25 MED ORDER — REGADENOSON 0.4 MG/5ML IV SOLN
0.4000 mg | Freq: Once | INTRAVENOUS | Status: AC | PRN
  Administered 2015-01-25: 0.4 mg via INTRAVENOUS

## 2015-01-25 NOTE — Progress Notes (Signed)
Stress Lab Nurses Notes - [place] 01/25/2015 Reason for doing test: Chest Pain and Palpitation Type of test: [place] Nurse performing test: Gerrit Halls, RN Nuclear Medicine Tech: Redmond Baseman Echo Tech: Not Applicable MD performing test: [facility] Family MD: [facility] Test explained and consent signed: Yes.   IV started: Saline lock flushed, No redness or edema and Saline lock started in radiology Symptoms: flushed & stomach pressure Treatment/Intervention: None Reason test stopped: protocol completed After recovery IV was: Discontinued via X-ray tech and No redness or edema Patient to return to Nuc. Med at : 9:55 Patient discharged: Home Patient's Condition upon discharge was: stable Comments: During test BP 113/64 & HR 89.  Recovery BP 92/61 & HR 73.  Symptoms resolved in recovery.  [name]

## 2015-01-27 ENCOUNTER — Telehealth: Payer: Self-pay | Admitting: *Deleted

## 2015-01-27 NOTE — Telephone Encounter (Signed)
Pt made aware, forwarded to Dr. Everette Rank. Pt confirmed appt for 2/29

## 2015-01-27 NOTE — Telephone Encounter (Signed)
-----   Message from Arnoldo Lenis, MD sent at 01/27/2015  1:49 PM EST ----- Stress test looks good, no evidence of blockages. WIll discuss in further detail at next visit. Does not appear symptoms are heart related  [name] MD

## 2015-02-20 ENCOUNTER — Encounter: Payer: Self-pay | Admitting: Cardiology

## 2015-02-20 ENCOUNTER — Ambulatory Visit (INDEPENDENT_AMBULATORY_CARE_PROVIDER_SITE_OTHER): Payer: Medicare Other | Admitting: Cardiology

## 2015-02-20 VITALS — BP 115/67 | HR 67 | Ht 64.0 in | Wt 100.0 lb

## 2015-02-20 DIAGNOSIS — R079 Chest pain, unspecified: Secondary | ICD-10-CM

## 2015-02-20 NOTE — Patient Instructions (Signed)
Continue all current medications. Your physician wants you to follow up in: 6 months.  You will receive a reminder letter in the mail one-two months in advance.  If you don't receive a letter, please call our office to schedule the follow up appointment   

## 2015-02-20 NOTE — Progress Notes (Signed)
Clinical Summary Ms. Duclos is a 74 y.o.female seen today for follow up of the following medical problems.   1. Chest pain/Palpitations - since last visit completed GXT, interpretation limited by heavy EKG artifact. Sent for lexiscan MPI which showed no ischemia.  - symptoms better since last visit. Still notes some palpitations at times.     Past Medical History  Diagnosis Date  . Asthma   . COPD (chronic obstructive pulmonary disease)   . S/P colonoscopy 2009    dr. Gala Romney: anal papilla, 2 cecal AVMs. Due in 2014  . Dysphagia     requiring multiple dilations in past  . S/P endoscopy 2012    Dr. Gala Romney: mild chronic gastritis, bulbar erosions, s/p 54-French Maloney dilation   . S/P endoscopy 2003, 2004, 2007    gastritis, duodenitis, s/p dilation, +H.pylori      No Known Allergies   Current Outpatient Prescriptions  Medication Sig Dispense Refill  . ADVAIR DISKUS 250-50 MCG/DOSE AEPB Take 1 puff by mouth 2 (two) times daily.     Marland Kitchen albuterol (PROVENTIL HFA;VENTOLIN HFA) 108 (90 BASE) MCG/ACT inhaler Inhale 2 puffs into the lungs every 6 (six) hours as needed for wheezing or shortness of breath.     . fluticasone (FLONASE) 50 MCG/ACT nasal spray Place 2 sprays into both nostrils daily. 16 g 0  . Multiple Vitamin (MULTIVITAMIN) capsule Take 1 capsule by mouth daily.      . sodium chloride (OCEAN) 0.65 % SOLN nasal spray Place 1 spray into both nostrils as needed for congestion. 30 mL 0   No current facility-administered medications for this visit.     Past Surgical History  Procedure Laterality Date  . Appendectomy    . Inguinal hernia repair    . Tonsillectomy    . Total abdominal hysterectomy    . Cholecystectomy       No Known Allergies    Family History  Problem Relation Age of Onset  . Colon cancer Neg Hx      Social History Ms. Varble reports that she has been smoking.  She started smoking about 55 years ago. She has never used smokeless  tobacco. Ms. Mandrell reports that she does not drink alcohol.   Review of Systems CONSTITUTIONAL: No weight loss, fever, chills, weakness or fatigue.  HEENT: Eyes: No visual loss, blurred vision, double vision or yellow sclerae.No hearing loss, sneezing, congestion, runny nose or sore throat.  SKIN: No rash or itching.  CARDIOVASCULAR: per HPI RESPIRATORY: No shortness of breath, cough or sputum.  GASTROINTESTINAL: No anorexia, nausea, vomiting or diarrhea. No abdominal pain or blood.  GENITOURINARY: No burning on urination, no polyuria NEUROLOGICAL: No headache, dizziness, syncope, paralysis, ataxia, numbness or tingling in the extremities. No change in bowel or bladder control.  MUSCULOSKELETAL: No muscle, back pain, joint pain or stiffness.  LYMPHATICS: No enlarged nodes. No history of splenectomy.  PSYCHIATRIC: No history of depression or anxiety.  ENDOCRINOLOGIC: No reports of sweating, cold or heat intolerance. No polyuria or polydipsia.  Marland Kitchen   Physical Examination p 67 bp 115/67 Wt 100 lbs BMI 17 Gen: resting comfortably, no acute distress HEENT: no scleral icterus, pupils equal round and reactive, no palptable cervical adenopathy,  CV: RRR, no m/r/g, no JVD, no carotid bruits Resp: Clear to auscultation bilaterally GI: abdomen is soft, non-tender, non-distended, normal bowel sounds, no hepatosplenomegaly MSK: extremities are warm, no edema.  Skin: warm, no rash Neuro:  no focal deficits Psych: appropriate affect  Diagnostic Studies 12/2014 GXT 1. Nondiagnostic stress EKG, EKG interpretation affected by significant artifact and baseline wander. Cannot accurately calculate Duke score based on available EKG tracings.  2. Dizziness and chest pain with stress of unclear etiology 3. Consider stress imaging to further evaluate possible cardiac etiology of chest pain.   01/2015 Lexiscan MPI IMPRESSION: 1. No reversible ischemia or infarction.  2. Normal left ventricular wall  motion.  3. Left ventricular ejection fraction 77%  4. Low-risk stress test findings*.  Assessment and Plan  1. Chest pain - negative stress testing, no clear evidence of cardiac cause - long history of GI problems, has f/u with them coming up. - rare palpitations, if increase in frequency will consider monitor   F/u 6 months       Arnoldo Lenis, M.D.

## 2015-03-30 DIAGNOSIS — J449 Chronic obstructive pulmonary disease, unspecified: Secondary | ICD-10-CM | POA: Diagnosis not present

## 2015-03-30 DIAGNOSIS — R1310 Dysphagia, unspecified: Secondary | ICD-10-CM | POA: Diagnosis not present

## 2015-04-25 ENCOUNTER — Encounter (INDEPENDENT_AMBULATORY_CARE_PROVIDER_SITE_OTHER): Payer: Self-pay

## 2015-04-25 ENCOUNTER — Encounter: Payer: Self-pay | Admitting: Gastroenterology

## 2015-04-25 ENCOUNTER — Other Ambulatory Visit: Payer: Self-pay

## 2015-04-25 ENCOUNTER — Ambulatory Visit (INDEPENDENT_AMBULATORY_CARE_PROVIDER_SITE_OTHER): Payer: Medicare Other | Admitting: Gastroenterology

## 2015-04-25 VITALS — BP 98/62 | HR 75 | Temp 97.6°F | Ht 62.0 in | Wt 99.2 lb

## 2015-04-25 DIAGNOSIS — R1314 Dysphagia, pharyngoesophageal phase: Secondary | ICD-10-CM | POA: Diagnosis not present

## 2015-04-25 DIAGNOSIS — R1310 Dysphagia, unspecified: Secondary | ICD-10-CM

## 2015-04-25 DIAGNOSIS — Z860100 Personal history of colon polyps, unspecified: Secondary | ICD-10-CM

## 2015-04-25 DIAGNOSIS — R1319 Other dysphagia: Secondary | ICD-10-CM

## 2015-04-25 MED ORDER — PEG 3350-KCL-NA BICARB-NACL 420 G PO SOLR: 4000.0000 mL | Freq: Once | ORAL | Status: DC

## 2015-04-25 NOTE — Progress Notes (Signed)
Primary Care Physician:  Lanette Hampshire, MD  Primary Gastroenterologist:  Garfield Cornea, MD   Chief Complaint  Patient presents with  . Dysphagia    HPI:  Katelyn [name] is a 74 y.o. female here for further evaluation of atypical chest discomfort and esophageal dysphagia. Recent unremarkable cardiac work up.   Patient last seen in 2012. EGD with Maloney dilation at that time. Normal appearing esophagus with benign biopsy. Gastric mucosa with mottling, submucosal petechiae with benign gastritis. Reports previous esophageal dilations have helped tremendously.   She did well until about 3 months ago.started having trouble swallowing liquids and solids. Bread and meats get stuck. Has to try and wash down. Pressure noted substernal area. Denies heartburn on regular basis. No vomiting. No abdominal pain. No weight loss. EGD/ED has helped in the past.   BM regular.  No melena, brbpr. Last colonoscopy in 2009. H/o polyps in [place]. She had two cecal AVMs, anal papilla. Advised to have another colonoscopy in 5 years. She is due at this time.   Current Outpatient Prescriptions  Medication Sig Dispense Refill  . ADVAIR DISKUS 250-50 MCG/DOSE AEPB Take 1 puff by mouth 2 (two) times daily.     . Multiple Vitamin (MULTIVITAMIN) capsule Take 1 capsule by mouth daily.      . sodium chloride (OCEAN) 0.65 % SOLN nasal spray Place 1 spray into both nostrils as needed for congestion. (Patient not taking: Reported on 04/25/2015) 30 mL 0   No current facility-administered medications for this visit.    Allergies as of 04/25/2015  . (No Known Allergies)    Past Medical History  Diagnosis Date  . Asthma   . COPD (chronic obstructive pulmonary disease)   . S/P colonoscopy 2009    dr. Gala Romney: anal papilla, 2 cecal AVMs. Due in 2014  . Dysphagia     requiring multiple dilations in past  . S/P endoscopy 2012    Dr. Gala Romney: mild chronic gastritis, bulbar erosions, s/p 54-French Maloney dilation   .  S/P endoscopy 2003, 2004, 2007    gastritis, duodenitis, s/p dilation, +H.pylori     Past Surgical History  Procedure Laterality Date  . Appendectomy    . Inguinal hernia repair    . Tonsillectomy    . Total abdominal hysterectomy    . Cholecystectomy    . Esophagogastroduodenoscopy  04/02/2011    RMR: 1. Endoscopically normal -appearing esophagus status post passage of a Maloney dilator followed by biopsy. 2. Hiatal hernia, Mottling, and submucosal petechiae in teh gastric mucoas of uncertain significance status post biopsy. 3. Bulbar erosions as described above.   . Colonoscopy  10/10/2008    RMR: 1. Anal papilla, otherwise normal rectum. 2. Two cecal arteriovenous malformations, colonic mucosa apperaed normal.     Family History  Problem Relation Age of Onset  . Colon cancer Neg Hx   . Heart disease Mother   . Lung cancer Father     History   Social History  . Marital Status: Widowed    Spouse Name: N/A  . Number of Children: N/A  . Years of Education: N/A   Occupational History  . Not on file.   Social History Main Topics  . Smoking status: Current Every Day Smoker -- 1.00 packs/day    Types: Cigarettes    Start date: 08/18/1959  . Smokeless tobacco: Never Used  . Alcohol Use: No  . Drug Use: No  . Sexual Activity: Not on file   Other Topics Concern  .  Not on file   Social History Narrative      ROS:  General: Negative for anorexia, weight loss, fever, chills, fatigue, weakness. Eyes: Negative for vision changes.  ENT: Negative for hoarseness, nasal congestion. CV: Negative for chest pain, angina, palpitations, dyspnea on exertion, peripheral edema.  Respiratory: Negative for dyspnea at rest,  cough, sputum, wheezing. +DOE GI: See history of present illness. GU:  Negative for dysuria, hematuria, urinary incontinence, urinary frequency, nocturnal urination.  MS: Negative for joint pain, low back pain.  Derm: Negative for rash or itching.  Neuro:  Negative for weakness, abnormal sensation, seizure, frequent headaches, memory loss, confusion.  Psych: Negative for anxiety, depression, suicidal ideation, hallucinations.  Endo: Negative for unusual weight change.  Heme: Negative for bruising or bleeding. Allergy: Negative for rash or hives.    Physical Examination:  BP 98/62 mmHg  Pulse 75  Temp(Src) 97.6 F (36.4 C) (Oral)  Ht 5\' 2"  (1.575 m)  Wt 99 lb 3.2 oz (44.997 kg)  BMI 18.14 kg/m2   General: Well-nourished, well-developed in no acute distress.  Head: Normocephalic, atraumatic.   Eyes: Conjunctiva pink, no icterus. Mouth: Oropharyngeal mucosa moist and pink , no lesions erythema or exudate. Neck: Supple without thyromegaly, masses, or lymphadenopathy.  Lungs: Clear to auscultation bilaterally.  Heart: Regular rate and rhythm, no murmurs rubs or gallops.  Abdomen: Bowel sounds are normal, nontender, nondistended, no hepatosplenomegaly or masses, no abdominal bruits or    hernia , no rebound or guarding.   Rectal: not performed Extremities: No lower extremity edema. No clubbing or deformities.  Neuro: Alert and oriented x 4 , grossly normal neurologically.  Skin: Warm and dry, no rash or jaundice.   Psych: Alert and cooperative, normal mood and affect.  Imaging Studies: No results found.

## 2015-04-25 NOTE — Patient Instructions (Signed)
1. Colonoscopy and upper endoscopy with Dr. Rourk. See separate instructions. 

## 2015-04-27 ENCOUNTER — Encounter: Payer: Self-pay | Admitting: Gastroenterology

## 2015-04-27 NOTE — Assessment & Plan Note (Signed)
74 y/o female with h/o recurrent esophageal dysphagia to solids/liquids who presents with similar symptoms to past. However, now she has had some atypical chest pain with unremarkable cardiac work up. Last EGD 2012 with resolution of dysphagia until recently. Patient denies frequent heartburn. She is not on any PPI.   Recommend EGD with dilation in the near future.  I have discussed the risks, alternatives, benefits with regards to but not limited to the risk of reaction to medication, bleeding, infection, perforation and the patient is agreeable to proceed. Written consent to be obtained.

## 2015-04-27 NOTE — Assessment & Plan Note (Signed)
H/O surveillance colonoscopy for h/o colon polyps.  I have discussed the risks, alternatives, benefits with regards to but not limited to the risk of reaction to medication, bleeding, infection, perforation and the patient is agreeable to proceed. Written consent to be obtained.

## 2015-05-03 NOTE — Progress Notes (Signed)
CC'ED TO PCP 

## 2015-05-10 ENCOUNTER — Ambulatory Visit (HOSPITAL_COMMUNITY)
Admission: RE | Admit: 2015-05-10 | Discharge: 2015-05-10 | Disposition: A | Discharge status: Home / Self Care | Payer: Medicare Other | Source: Ambulatory Visit | Attending: Internal Medicine | Admitting: Internal Medicine

## 2015-05-10 ENCOUNTER — Encounter (HOSPITAL_COMMUNITY): Payer: Self-pay

## 2015-05-10 ENCOUNTER — Encounter (HOSPITAL_COMMUNITY)
Admission: RE | Disposition: A | Discharge status: Home / Self Care | Payer: Self-pay | Source: Ambulatory Visit | Attending: Internal Medicine

## 2015-05-10 DIAGNOSIS — K648 Other hemorrhoids: Secondary | ICD-10-CM | POA: Diagnosis not present

## 2015-05-10 DIAGNOSIS — D124 Benign neoplasm of descending colon: Secondary | ICD-10-CM | POA: Diagnosis not present

## 2015-05-10 DIAGNOSIS — F17210 Nicotine dependence, cigarettes, uncomplicated: Secondary | ICD-10-CM | POA: Diagnosis not present

## 2015-05-10 DIAGNOSIS — D128 Benign neoplasm of rectum: Secondary | ICD-10-CM | POA: Diagnosis not present

## 2015-05-10 DIAGNOSIS — Z86010 Personal history of colonic polyps: Secondary | ICD-10-CM | POA: Diagnosis not present

## 2015-05-10 DIAGNOSIS — Z9049 Acquired absence of other specified parts of digestive tract: Secondary | ICD-10-CM | POA: Diagnosis not present

## 2015-05-10 DIAGNOSIS — R1310 Dysphagia, unspecified: Secondary | ICD-10-CM

## 2015-05-10 DIAGNOSIS — Z7951 Long term (current) use of inhaled steroids: Secondary | ICD-10-CM | POA: Diagnosis not present

## 2015-05-10 DIAGNOSIS — K5520 Angiodysplasia of colon without hemorrhage: Secondary | ICD-10-CM | POA: Insufficient documentation

## 2015-05-10 DIAGNOSIS — K3189 Other diseases of stomach and duodenum: Secondary | ICD-10-CM | POA: Insufficient documentation

## 2015-05-10 DIAGNOSIS — K6289 Other specified diseases of anus and rectum: Secondary | ICD-10-CM | POA: Insufficient documentation

## 2015-05-10 DIAGNOSIS — K449 Diaphragmatic hernia without obstruction or gangrene: Secondary | ICD-10-CM | POA: Insufficient documentation

## 2015-05-10 DIAGNOSIS — D129 Benign neoplasm of anus and anal canal: Secondary | ICD-10-CM | POA: Diagnosis not present

## 2015-05-10 DIAGNOSIS — J449 Chronic obstructive pulmonary disease, unspecified: Secondary | ICD-10-CM | POA: Insufficient documentation

## 2015-05-10 DIAGNOSIS — K2950 Unspecified chronic gastritis without bleeding: Secondary | ICD-10-CM | POA: Insufficient documentation

## 2015-05-10 DIAGNOSIS — R1314 Dysphagia, pharyngoesophageal phase: Secondary | ICD-10-CM

## 2015-05-10 DIAGNOSIS — Z79899 Other long term (current) drug therapy: Secondary | ICD-10-CM | POA: Diagnosis not present

## 2015-05-10 DIAGNOSIS — J45909 Unspecified asthma, uncomplicated: Secondary | ICD-10-CM | POA: Diagnosis not present

## 2015-05-10 HISTORY — DX: Adverse effect of unspecified anesthetic, initial encounter: T41.45XA

## 2015-05-10 HISTORY — DX: Other complications of anesthesia, initial encounter: T88.59XA

## 2015-05-10 HISTORY — PX: ESOPHAGEAL DILATION: SHX303

## 2015-05-10 HISTORY — PX: ESOPHAGOGASTRODUODENOSCOPY: SHX5428

## 2015-05-10 HISTORY — PX: COLONOSCOPY: SHX5424

## 2015-05-10 LAB — HM COLONOSCOPY

## 2015-05-10 SURGERY — COLONOSCOPY
Anesthesia: Moderate Sedation

## 2015-05-10 MED ORDER — SODIUM CHLORIDE 0.9 % IV SOLN
INTRAVENOUS | Status: DC
  Administered 2015-05-10: 12:00:00 via INTRAVENOUS

## 2015-05-10 MED ORDER — LIDOCAINE VISCOUS 2 % MT SOLN
OROMUCOSAL | Status: DC | PRN
  Administered 2015-05-10: 3 mL via OROMUCOSAL

## 2015-05-10 MED ORDER — ONDANSETRON HCL 4 MG/2ML IJ SOLN
INTRAMUSCULAR | Status: AC
  Filled 2015-05-10: qty 2

## 2015-05-10 MED ORDER — MIDAZOLAM HCL 5 MG/5ML IJ SOLN
INTRAMUSCULAR | Status: AC
  Filled 2015-05-10: qty 10

## 2015-05-10 MED ORDER — MEPERIDINE HCL 100 MG/ML IJ SOLN
INTRAMUSCULAR | Status: AC
  Filled 2015-05-10: qty 2

## 2015-05-10 MED ORDER — LIDOCAINE VISCOUS 2 % MT SOLN
OROMUCOSAL | Status: AC
  Filled 2015-05-10: qty 15

## 2015-05-10 MED ORDER — SIMETHICONE 40 MG/0.6ML PO SUSP
ORAL | Status: DC | PRN
  Administered 2015-05-10: 13:00:00

## 2015-05-10 MED ORDER — MIDAZOLAM HCL 5 MG/5ML IJ SOLN
INTRAMUSCULAR | Status: DC | PRN
  Administered 2015-05-10: 2 mg via INTRAVENOUS
  Administered 2015-05-10 (×2): 1 mg via INTRAVENOUS

## 2015-05-10 MED ORDER — ONDANSETRON HCL 4 MG/2ML IJ SOLN
INTRAMUSCULAR | Status: DC | PRN
  Administered 2015-05-10: 4 mg via INTRAVENOUS

## 2015-05-10 MED ORDER — MEPERIDINE HCL 100 MG/ML IJ SOLN
INTRAMUSCULAR | Status: DC | PRN
  Administered 2015-05-10: 50 mg via INTRAVENOUS

## 2015-05-10 NOTE — H&P (View-Only) (Signed)
Primary Care Physician:  Lanette Hampshire, MD  Primary Gastroenterologist:  Garfield Cornea, MD   Chief Complaint  Patient presents with  . Dysphagia    HPI:  Katelyn [name] is a 74 y.o. female here for further evaluation of atypical chest discomfort and esophageal dysphagia. Recent unremarkable cardiac work up.   Patient last seen in 2012. EGD with Maloney dilation at that time. Normal appearing esophagus with benign biopsy. Gastric mucosa with mottling, submucosal petechiae with benign gastritis. Reports previous esophageal dilations have helped tremendously.   She did well until about 3 months ago.started having trouble swallowing liquids and solids. Bread and meats get stuck. Has to try and wash down. Pressure noted substernal area. Denies heartburn on regular basis. No vomiting. No abdominal pain. No weight loss. EGD/ED has helped in the past.   BM regular.  No melena, brbpr. Last colonoscopy in 2009. H/o polyps in [place]. She had two cecal AVMs, anal papilla. Advised to have another colonoscopy in 5 years. She is due at this time.   Current Outpatient Prescriptions  Medication Sig Dispense Refill  . ADVAIR DISKUS 250-50 MCG/DOSE AEPB Take 1 puff by mouth 2 (two) times daily.     . Multiple Vitamin (MULTIVITAMIN) capsule Take 1 capsule by mouth daily.      . sodium chloride (OCEAN) 0.65 % SOLN nasal spray Place 1 spray into both nostrils as needed for congestion. (Patient not taking: Reported on 04/25/2015) 30 mL 0   No current facility-administered medications for this visit.    Allergies as of 04/25/2015  . (No Known Allergies)    Past Medical History  Diagnosis Date  . Asthma   . COPD (chronic obstructive pulmonary disease)   . S/P colonoscopy 2009    dr. Gala Romney: anal papilla, 2 cecal AVMs. Due in 2014  . Dysphagia     requiring multiple dilations in past  . S/P endoscopy 2012    Dr. Gala Romney: mild chronic gastritis, bulbar erosions, s/p 54-French Maloney dilation   .  S/P endoscopy 2003, 2004, 2007    gastritis, duodenitis, s/p dilation, +H.pylori     Past Surgical History  Procedure Laterality Date  . Appendectomy    . Inguinal hernia repair    . Tonsillectomy    . Total abdominal hysterectomy    . Cholecystectomy    . Esophagogastroduodenoscopy  04/02/2011    RMR: 1. Endoscopically normal -appearing esophagus status post passage of a Maloney dilator followed by biopsy. 2. Hiatal hernia, Mottling, and submucosal petechiae in teh gastric mucoas of uncertain significance status post biopsy. 3. Bulbar erosions as described above.   . Colonoscopy  10/10/2008    RMR: 1. Anal papilla, otherwise normal rectum. 2. Two cecal arteriovenous malformations, colonic mucosa apperaed normal.     Family History  Problem Relation Age of Onset  . Colon cancer Neg Hx   . Heart disease Mother   . Lung cancer Father     History   Social History  . Marital Status: Widowed    Spouse Name: N/A  . Number of Children: N/A  . Years of Education: N/A   Occupational History  . Not on file.   Social History Main Topics  . Smoking status: Current Every Day Smoker -- 1.00 packs/day    Types: Cigarettes    Start date: 08/18/1959  . Smokeless tobacco: Never Used  . Alcohol Use: No  . Drug Use: No  . Sexual Activity: Not on file   Other Topics Concern  .  Not on file   Social History Narrative      ROS:  General: Negative for anorexia, weight loss, fever, chills, fatigue, weakness. Eyes: Negative for vision changes.  ENT: Negative for hoarseness, nasal congestion. CV: Negative for chest pain, angina, palpitations, dyspnea on exertion, peripheral edema.  Respiratory: Negative for dyspnea at rest,  cough, sputum, wheezing. +DOE GI: See history of present illness. GU:  Negative for dysuria, hematuria, urinary incontinence, urinary frequency, nocturnal urination.  MS: Negative for joint pain, low back pain.  Derm: Negative for rash or itching.  Neuro:  Negative for weakness, abnormal sensation, seizure, frequent headaches, memory loss, confusion.  Psych: Negative for anxiety, depression, suicidal ideation, hallucinations.  Endo: Negative for unusual weight change.  Heme: Negative for bruising or bleeding. Allergy: Negative for rash or hives.    Physical Examination:  BP 98/62 mmHg  Pulse 75  Temp(Src) 97.6 F (36.4 C) (Oral)  Ht 5\' 2"  (1.575 m)  Wt 99 lb 3.2 oz (44.997 kg)  BMI 18.14 kg/m2   General: Well-nourished, well-developed in no acute distress.  Head: Normocephalic, atraumatic.   Eyes: Conjunctiva pink, no icterus. Mouth: Oropharyngeal mucosa moist and pink , no lesions erythema or exudate. Neck: Supple without thyromegaly, masses, or lymphadenopathy.  Lungs: Clear to auscultation bilaterally.  Heart: Regular rate and rhythm, no murmurs rubs or gallops.  Abdomen: Bowel sounds are normal, nontender, nondistended, no hepatosplenomegaly or masses, no abdominal bruits or    hernia , no rebound or guarding.   Rectal: not performed Extremities: No lower extremity edema. No clubbing or deformities.  Neuro: Alert and oriented x 4 , grossly normal neurologically.  Skin: Warm and dry, no rash or jaundice.   Psych: Alert and cooperative, normal mood and affect.  Imaging Studies: No results found.

## 2015-05-10 NOTE — Op Note (Signed)
[facility] [address] [place], [address]   ENDOSCOPY PROCEDURE REPORT  PATIENT: [name], Katelyn  MR#: [id number] BIRTHDATE: 1941-02-24 , 73  yrs. old GENDER: female ENDOSCOPIST: R.  Garfield Cornea, MD FACP FACG REFERRED BY:  Marjean Donna, M.D. PROCEDURE DATE:  2015/05/25 PROCEDURE:  EGD w/ biopsy and Maloney dilation of esophagus INDICATIONS:  Recurrent esophageal dysphagia. MEDICATIONS: Demerol 50 mg IV and Versed 3 mg IV in divided doses. Zofran 4 mg IV.  Xylocaine gel orally ASA CLASS:      Class II  CONSENT: The risks, benefits, limitations, alternatives and imponderables have been discussed.  The potential for biopsy, esophogeal dilation, etc. have also been reviewed.  Questions have been answered.  All parties agreeable.  Please see the history and physical in the medical record for more information.  DESCRIPTION OF PROCEDURE: After the risks benefits and alternatives of the procedure were thoroughly explained, informed consent was obtained.  The EG-[id number] ([id number]) endoscope was introduced through the mouth and advanced to the second portion of the duodenum , limited by Without limitations. The instrument was slowly withdrawn as the mucosa was fully examined.    Normal-appearing tubular esophagus.  It was patent throughout its course.  Stomach empty.  Small hiatal hernia.  Antral erosions and minimal polypoid appearance of the gastric mucosa present.  No ulcer or infiltrating process observed.  Patent pylorus.  Normal first and second portion of the duodenum.  Scope was withdrawn, a 40 [place] Maloney dilator was passed to full insertion with ease. A look back revealed no apparent complication related to this maneuver.  Subsequently, biopsies of the abnormal gastric mucosa taken for histologic study.  Retroflexed views revealed a hiatal hernia.     The scope was then withdrawn from the patient and the procedure completed.  COMPLICATIONS: There were  no immediate complications.  ENDOSCOPIC IMPRESSION: Normal esophagus?"status post Maloney dilation. Small hiatal hernia. Abnormal gastric because of doubtful clinical significance?"status post biopsy  RECOMMENDATIONS: Begin Protonix 40 mg daily. Follow up on pathology. See colonoscopy report.  REPEAT EXAM:  eSigned:  R. Garfield Cornea, MD Rosalita Chessman [facility] 05-25-2015 1:26 PM    CC:  CPT CODES: ICD CODES:  The ICD and CPT codes recommended by this software are interpretations from the data that the clinical staff has captured with the software.  The verification of the translation of this report to the ICD and CPT codes and modifiers is the sole responsibility of the health care institution and practicing physician where this report was generated.  [employer]. will not be held responsible for the validity of the ICD and CPT codes included on this report.  AMA assumes no liability for data contained or not contained herein. CPT is a Designer, television/film set of the [employer].

## 2015-05-10 NOTE — Interval H&P Note (Signed)
History and Physical Interval Note:  05/10/2015 1:04 PM  Katelyn [name]  has presented today for surgery, with the diagnosis of hx of colon polyps, esophageal dysphagia  The various methods of treatment have been discussed with the patient and family. After consideration of risks, benefits and other options for treatment, the patient has consented to  Procedure(s) with comments: COLONOSCOPY (N/A) - 1245 ESOPHAGOGASTRODUODENOSCOPY (EGD) (N/A) ESOPHAGEAL DILATION (N/A) as a surgical intervention .  The patient's history has been reviewed, patient examined, no change in status, stable for surgery.  I have reviewed the patient's chart and labs.  Questions were answered to the patient's satisfaction.     Robert Rourk  No change.  For EGD with esophageal dilation as appropriate and surveillance colonoscopy per plan.  The risks, benefits, limitations, imponderables and alternatives regarding both EGD and colonoscopy have been reviewed with the patient. Questions have been answered. All parties agreeable.

## 2015-05-10 NOTE — Discharge Instructions (Signed)
Colonoscopy Discharge Instructions  Read the instructions outlined below and refer to this sheet in the next few weeks. These discharge instructions provide you with general information on caring for yourself after you leave the hospital. Your doctor may also give you specific instructions. While your treatment has been planned according to the most current medical practices available, unavoidable complications occasionally occur. If you have any problems or questions after discharge, call Dr. Gala Romney at [phone]. ACTIVITY  You may resume your regular activity, but move at a slower pace for the next 24 hours.   Take frequent rest periods for the next 24 hours.   Walking will help get rid of the air and reduce the bloated feeling in your belly (abdomen).   No driving for 24 hours (because of the medicine (anesthesia) used during the test).    Do not sign any important legal documents or operate any machinery for 24 hours (because of the anesthesia used during the test).  NUTRITION  Drink plenty of fluids.   You may resume your normal diet as instructed by your doctor.   Begin with a light meal and progress to your normal diet. Heavy or fried foods are harder to digest and may make you feel sick to your stomach (nauseated).   Avoid alcoholic beverages for 24 hours or as instructed.  MEDICATIONS  You may resume your normal medications unless your doctor tells you otherwise.  WHAT YOU CAN EXPECT TODAY  Some feelings of bloating in the abdomen.   Passage of more gas than usual.   Spotting of blood in your stool or on the toilet paper.  IF YOU HAD POLYPS REMOVED DURING THE COLONOSCOPY:  No aspirin products for 7 days or as instructed.   No alcohol for 7 days or as instructed.   Eat a soft diet for the next 24 hours.  FINDING OUT THE RESULTS OF YOUR TEST Not all test results are available during your visit. If your test results are not back during the visit, make an appointment  with your caregiver to find out the results. Do not assume everything is normal if you have not heard from your caregiver or the medical facility. It is important for you to follow up on all of your test results.  SEEK IMMEDIATE MEDICAL ATTENTION IF:  You have more than a spotting of blood in your stool.   Your belly is swollen (abdominal distention).   You are nauseated or vomiting.   You have a temperature over 101.   You have abdominal pain or discomfort that is severe or gets worse throughout the day.   EGD Discharge instructions Please read the instructions outlined below and refer to this sheet in the next few weeks. These discharge instructions provide you with general information on caring for yourself after you leave the hospital. Your doctor may also give you specific instructions. While your treatment has been planned according to the most current medical practices available, unavoidable complications occasionally occur. If you have any problems or questions after discharge, please call your doctor. ACTIVITY  You may resume your regular activity but move at a slower pace for the next 24 hours.   Take frequent rest periods for the next 24 hours.   Walking will help expel (get rid of) the air and reduce the bloated feeling in your abdomen.   No driving for 24 hours (because of the anesthesia (medicine) used during the test).   You may shower.   Do not sign any  important legal documents or operate any machinery for 24 hours (because of the anesthesia used during the test).  NUTRITION  Drink plenty of fluids.   You may resume your normal diet.   Begin with a light meal and progress to your normal diet.   Avoid alcoholic beverages for 24 hours or as instructed by your caregiver.  MEDICATIONS  You may resume your normal medications unless your caregiver tells you otherwise.  WHAT YOU CAN EXPECT TODAY  You may experience abdominal discomfort such as a feeling of  fullness or gas pains.  FOLLOW-UP  Your doctor will discuss the results of your test with you.  SEEK IMMEDIATE MEDICAL ATTENTION IF ANY OF THE FOLLOWING OCCUR:  Excessive nausea (feeling sick to your stomach) and/or vomiting.   Severe abdominal pain and distention (swelling).   Trouble swallowing.   Temperature over 101 F (37.8 C).   Rectal bleeding or vomiting of blood.      Polyp and GERD information provided  Begin Protonix 40 mg daily for GERD and to help your esophagus/swallowing  Further recommendations to follow pending review of pathology report  Office visit with [place] in one year.  Pantoprazole tablets What is this medicine? PANTOPRAZOLE (pan TOE pra zole) prevents the production of acid in the stomach. It is used to treat gastroesophageal reflux disease (GERD), inflammation of the esophagus, and Zollinger-Ellison syndrome. This medicine may be used for other purposes; ask your health care provider or pharmacist if you have questions. COMMON BRAND NAME(S): Protonix What should I tell my health care provider before I take this medicine? They need to know if you have any of these conditions: -liver disease -low levels of magnesium in the blood -an unusual or allergic reaction to omeprazole, lansoprazole, pantoprazole, rabeprazole, other medicines, foods, dyes, or preservatives -pregnant or trying to get pregnant -breast-feeding How should I use this medicine? Take this medicine by mouth. Swallow the tablets whole with a drink of water. Follow the directions on the prescription label. Do not crush, break, or chew. Take your medicine at regular intervals. Do not take your medicine more often than directed. Talk to your pediatrician regarding the use of this medicine in children. While this drug may be prescribed for children as young as 5 years for selected conditions, precautions do apply. Overdosage: If you think you have taken too much of this medicine contact a  poison control center or emergency room at once. NOTE: This medicine is only for you. Do not share this medicine with others. What if I miss a dose? If you miss a dose, take it as soon as you can. If it is almost time for your next dose, take only that dose. Do not take double or extra doses. What may interact with this medicine? Do not take this medicine with any of the following medications: -atazanavir -nelfinavir This medicine may also interact with the following medications: -ampicillin -delavirdine -digoxin -diuretics -iron salts -medicines for fungal infections like ketoconazole, itraconazole and voriconazole -warfarin This list may not describe all possible interactions. Give your health care provider a list of all the medicines, herbs, non-prescription drugs, or dietary supplements you use. Also tell them if you smoke, drink alcohol, or use illegal drugs. Some items may interact with your medicine. What should I watch for while using this medicine? It can take several days before your stomach pain gets better. Check with your doctor or health care professional if your condition does not start to get better, or if it  gets worse. You may need blood work done while you are taking this medicine. What side effects may I notice from receiving this medicine? Side effects that you should report to your doctor or health care professional as soon as possible: -allergic reactions like skin rash, itching or hives, swelling of the face, lips, or tongue -bone, muscle or joint pain -breathing problems -chest pain or chest tightness -dark yellow or brown urine -dizziness -fast, irregular heartbeat -feeling faint or lightheaded -fever or sore throat -muscle spasm -palpitations -redness, blistering, peeling or loosening of the skin, including inside the mouth -seizures -tremors -unusual bleeding or bruising -unusually weak or tired -yellowing of the eyes or skin Side effects that  usually do not require medical attention (Report these to your doctor or health care professional if they continue or are bothersome.): -constipation -diarrhea -dry mouth -headache -nausea This list may not describe all possible side effects. Call your doctor for medical advice about side effects. You may report side effects to FDA at 1-800-FDA-1088. Where should I keep my medicine? Keep out of the reach of children. Store at room temperature between 15 and 30 degrees C (59 and 86 degrees F). Protect from light and moisture. Throw away any unused medicine after the expiration date.  Colon Polyps Polyps are lumps of extra tissue growing inside the body. Polyps can grow in the large intestine (colon). Most colon polyps are noncancerous (benign). However, some colon polyps can become cancerous over time. Polyps that are larger than a pea may be harmful. To be safe, caregivers remove and test all polyps. CAUSES  Polyps form when mutations in the genes cause your cells to grow and divide even though no more tissue is needed. RISK FACTORS There are a number of risk factors that can increase your chances of getting colon polyps. They include:  Being older than 50 years.  Family history of colon polyps or colon cancer.  Long-term colon diseases, such as colitis or Crohn disease.  Being overweight.  Smoking.  Being inactive.  Drinking too much alcohol. SYMPTOMS  Most small polyps do not cause symptoms. If symptoms are present, they may include:  Blood in the stool. The stool may look dark red or black.  Constipation or diarrhea that lasts longer than 1 week. DIAGNOSIS People often do not know they have polyps until their caregiver finds them during a regular checkup. Your caregiver can use 4 tests to check for polyps:  Digital rectal exam. The caregiver wears gloves and feels inside the rectum. This test would find polyps only in the rectum.  Barium enema. The caregiver puts a liquid  called barium into your rectum before taking X-rays of your colon. Barium makes your colon look white. Polyps are dark, so they are easy to see in the X-ray pictures.  Sigmoidoscopy. A thin, flexible tube (sigmoidoscope) is placed into your rectum. The sigmoidoscope has a light and tiny camera in it. The caregiver uses the sigmoidoscope to look at the last third of your colon.  Colonoscopy. This test is like sigmoidoscopy, but the caregiver looks at the entire colon. This is the most common method for finding and removing polyps. TREATMENT  Any polyps will be removed during a sigmoidoscopy or colonoscopy. The polyps are then tested for cancer. PREVENTION  To help lower your risk of getting more colon polyps:  Eat plenty of fruits and vegetables. Avoid eating fatty foods.  Do not smoke.  Avoid drinking alcohol.  Exercise every day.  Lose weight if  recommended by your caregiver.  Eat plenty of calcium and folate. Foods that are rich in calcium include milk, cheese, and broccoli. Foods that are rich in folate include chickpeas, kidney beans, and spinach. HOME CARE INSTRUCTIONS Keep all follow-up appointments as directed by your caregiver. You may need periodic exams to check for polyps. SEEK MEDICAL CARE IF: You notice bleeding during a bowel movement. DEsophagogastroduodenoscopy Esophagogastroduodenoscopy (EGD) is a procedure to examine the lining of the esophagus, stomach, and first part of the small intestine (duodenum). A long, flexible, lighted tube with a camera attached (endoscope) is inserted down the throat to view these organs. This procedure is done to detect problems or abnormalities, such as inflammation, bleeding, ulcers, or growths, in order to treat them. The procedure lasts about 5-20 minutes. It is usually an outpatient procedure, but it may need to be performed in emergency cases in the hospital. LET YOUR CAREGIVER KNOW ABOUT:   Allergies to food or medicine.  All  medicines you are taking, including vitamins, herbs, eyedrops, and over-the-counter medicines and creams.  Use of steroids (by mouth or creams).  Previous problems you or members of your family have had with the use of anesthetics.  Any blood disorders you have.  Previous surgeries you have had.  Other health problems you have.  Possibility of pregnancy, if this applies. RISKS AND COMPLICATIONS  Generally, EGD is a safe procedure. However, as with any procedure, complications can occur. Possible complications include:  Infection.  Bleeding.  Tearing (perforation) of the esophagus, stomach, or duodenum.  Difficulty breathing or not being able to breath.  Excessive sweating.  Spasms of the larynx.  Slowed heartbeat.  Low blood pressure. BEFORE THE PROCEDURE  Do not eat or drink anything for 6-8 hours before the procedure or as directed by your caregiver.  Ask your caregiver about changing or stopping your regular medicines.  If you wear dentures, be prepared to remove them before the procedure.  Arrange for someone to drive you home after the procedure. PROCEDURE   A vein will be accessed to give medicines and fluids. A medicine to relax you (sedative) and a pain reliever will be given through that access into the vein.  A numbing medicine (local anesthetic) may be sprayed on your throat for comfort and to stop you from gagging or coughing.  A mouth guard may be placed in your mouth to protect your teeth and to keep you from biting on the endoscope.  You will be asked to lie on your left side.  The endoscope is inserted down your throat and into the esophagus, stomach, and duodenum.  Air is put through the endoscope to allow your caregiver to view the lining of your esophagus clearly.  The esophagus, stomach, and duodenum is then examined. During the exam, your caregiver may:  Remove tissue to be examined under a microscope (biopsy) for inflammation, infection,  or other medical problems.  Remove growths.  Remove objects (foreign bodies) that are stuck.  Treat any bleeding with medicines or other devices that stop tissues from bleeding (hot cautery, clipping devices).  Widen (dilate) or stretch narrowed areas of the esophagus and stomach.  The endoscope will then be withdrawn. AFTER THE PROCEDURE  You will be taken to a recovery area to be monitored. You will be able to go home once you are stable and alert.  Do not eat or drink anything until the local anesthetic and numbing medicines have worn off. You may choke.  It is normal  to feel bloated, have pain with swallowing, or have a sore throat for a short time. This will wear off.  Your caregiver should be able to discuss his or her findings with you. It will take longer to discuss the test results if any biopsies were taken. Document Released: 04/11/2005 Document Revised: 04/25/2014 Document Reviewed: 11/11/2012 [facility] Patient Information 2015 [place], [place]. This information is not intended to replace advice given to you by your health care provider. Make sure you discuss any questions you have with your health care provider.

## 2015-05-10 NOTE — Op Note (Signed)
[facility] [address] [place], [address]   COLONOSCOPY PROCEDURE REPORT  PATIENT: Frimet, Durfee  MR#: [id number] BIRTHDATE: 1941/11/22 , 73  yrs. old GENDER: female ENDOSCOPIST: R.  Garfield Cornea, MD FACP [facility] REFERRED OY:DXAJO Everette Rank, M.D. PROCEDURE DATE:  05/11/2015 PROCEDURE:   Colonoscopy with snare polypectomy INDICATIONS:History of colonic adenoma. MEDICATIONS: Versed 4 mg IV and Demerol 50 mg IV in divided doses. Zofran 4 mg IV. ASA CLASS:       Class II  CONSENT: The risks, benefits, alternatives and imponderables including but not limited to bleeding, perforation as well as the possibility of a missed lesion have been reviewed.  The potential for biopsy, lesion removal, etc. have also been discussed. Questions have been answered.  All parties agreeable.  Please see the history and physical in the medical record for more information.  DESCRIPTION OF PROCEDURE:   After the risks benefits and alternatives of the procedure were thoroughly explained, informed consent was obtained.  The digital rectal exam revealed no abnormalities of the rectum.   The [id number] ([id number])  endoscope was introduced through the anus and advanced to the cecum, which was identified by both the appendix and ileocecal valve. No adverse events experienced.   The quality of the prep was adequate  The instrument was then slowly withdrawn as the colon was fully examined.      COLON FINDINGS: (2) 4 mm polyps in the rectum at 5 cm in from theanal verge; anal papilla and internal hemorrhoids; otherwise, the remainder of the rectal mucosa appeared normal.  (1) 6 millimeter polyp in the mid descending segment; the remainder of the colonic mucosa appeared normal aside from 2 innocent-appearing AVMs in the proximal ascending segment.   The above-mentioned polyps were cold snared -2 and hot snare removed, respectively.  Retroflexion was performed. .  Withdrawal time=13  minutes 0 seconds.  The scope was withdrawn and the procedure completed. COMPLICATIONS: There were no immediate complications.  ENDOSCOPIC IMPRESSION: Rectal and colonic polyps?"removed as described above. Ascending colon AVMs  RECOMMENDATIONS: Follow-up on pathology. See EGD report.  eSigned:  R. Garfield Cornea, MD [facility]al[facility]ssman [facility] 05/11/15 1:58 PM   cc:  CPT CODES: ICD CODES:  The ICD and CPT codes recommended by this software are interpretations from the data that the clinical staff has captured with the software.  The verification of the translation of this report to the ICD and CPT codes and modifiers is the sole responsibility of the health care institution and practicing physician where this report was generated.  [employer]. will not be held responsible for the validity of the ICD and CPT codes included on this report.  AMA assumes no liability for data contained or not contained herein. CPT is a Designer, television/film set of the [employer].  PATIENT NAME:  Marylon, Verno MR#: [id number]

## 2015-05-15 ENCOUNTER — Encounter (HOSPITAL_COMMUNITY): Payer: Self-pay | Admitting: Internal Medicine

## 2015-05-15 ENCOUNTER — Encounter: Payer: Self-pay | Admitting: Internal Medicine

## 2015-05-16 ENCOUNTER — Telehealth: Payer: Self-pay

## 2015-05-16 NOTE — Telephone Encounter (Signed)
Per RMR- Send letter to patient.  Send copy of letter with path to referring provider and PCP.   Offer patient a follow-up appointment with [place] in the next several weeks if not already done

## 2015-05-16 NOTE — Telephone Encounter (Signed)
Pt called with question about the results from her TCS. I answered them for her. She is having some discomfort but nothing that she is worried about at this time feels like it could be because of the new medication. I advise her to call if anything changed.

## 2015-05-16 NOTE — Telephone Encounter (Signed)
See telephone encounter dated today by Almyra Free. Needs follow up ov per RMR>

## 2015-05-16 NOTE — Telephone Encounter (Signed)
Letter mailed to the pt. 

## 2015-05-18 ENCOUNTER — Encounter: Payer: Self-pay | Admitting: Internal Medicine

## 2015-05-23 NOTE — Telephone Encounter (Signed)
Pt has an appointment on 06/05/15 @ 11:00 am

## 2015-05-29 NOTE — Telephone Encounter (Signed)
OV MADE °

## 2015-06-05 ENCOUNTER — Ambulatory Visit (INDEPENDENT_AMBULATORY_CARE_PROVIDER_SITE_OTHER): Payer: Medicare Other | Admitting: Gastroenterology

## 2015-06-05 ENCOUNTER — Encounter: Payer: Self-pay | Admitting: Gastroenterology

## 2015-06-05 VITALS — BP 107/64 | HR 72 | Temp 97.3°F | Ht 64.0 in | Wt 100.4 lb

## 2015-06-05 DIAGNOSIS — K2950 Unspecified chronic gastritis without bleeding: Secondary | ICD-10-CM

## 2015-06-05 DIAGNOSIS — R1314 Dysphagia, pharyngoesophageal phase: Secondary | ICD-10-CM

## 2015-06-05 DIAGNOSIS — Z86010 Personal history of colonic polyps: Secondary | ICD-10-CM

## 2015-06-05 DIAGNOSIS — R1310 Dysphagia, unspecified: Secondary | ICD-10-CM

## 2015-06-05 DIAGNOSIS — R1319 Other dysphagia: Secondary | ICD-10-CM

## 2015-06-05 NOTE — Assessment & Plan Note (Signed)
Next colonoscopy due May 2021.

## 2015-06-05 NOTE — Assessment & Plan Note (Signed)
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 Pantoprazole 40 mg daily started by Dr. Gala Romney. Some notable abdominal discomfort. Advised to take 30 minutes before meal daily. She has not been eating after taking the medication. If continued discomfort, we can switch PPI therapy. See back in one year.

## 2015-06-05 NOTE — Patient Instructions (Signed)
1. Continue pantoprazole 30 minutes before breakfast daily. If you continue to have abdominal discomfort please let me know we will try switching you to another medication. 2. If your swallowing issues worsen, please let me know we will schedule you for an esophagram to evaluate motility. 3. Her chart in the office in one year or sooner if needed.

## 2015-06-05 NOTE — Progress Notes (Signed)
      Primary Care Physician: Lanette Hampshire, MD  Primary Gastroenterologist:  Garfield Cornea, MD   Chief Complaint  Patient presents with  . Follow-up    HPI: Katelyn [name] is a 74 y.o. female here for follow-up. Recently underwent EGD and colonoscopy for history of dysphagia and colonic adenomas. She had a normal appearing tubular esophagus, small hiatal hernia, status post empiric dilation. Abnormal gastric mucosa, gastric biopsies showed chronic inactive gastritis, negative for H pylori. She had rectal (ubular adenomas) and descending colonic polyps (hyperplastic) removed. Ascending colon AVMs (2), innocent appearing.   Next colonoscopy in May 2021.  Feels like pantoprazole causing abdominal pain. Takes in the morning. Doesn't eat afterwards. Feels like punch in stomach/sharp pain. Lasts for few minutes, not every day. Nothing I can't live with. No N/V. Appetite improved. BM regular. No melena, brbpr. Intermittent dysphagia.    Current Outpatient Prescriptions  Medication Sig Dispense Refill  . ADVAIR DISKUS 250-50 MCG/DOSE AEPB Take 1 puff by mouth 2 (two) times daily.     Marland Kitchen albuterol (PROVENTIL HFA;VENTOLIN HFA) 108 (90 BASE) MCG/ACT inhaler Inhale 1 puff into the lungs every 6 (six) hours as needed for wheezing or shortness of breath.    . Calcium Carbonate-Vitamin D (CALCIUM + D PO) Take 500 mg by mouth daily.    . Multiple Vitamin (MULTIVITAMIN) capsule Take 1 capsule by mouth daily.      . pantoprazole (PROTONIX) 40 MG tablet Take 40 mg by mouth daily.    . sodium chloride (OCEAN) 0.65 % SOLN nasal spray Place 1 spray into both nostrils as needed for congestion. (Patient not taking: Reported on 04/25/2015) 30 mL 0   No current facility-administered medications for this visit.    Allergies as of 06/05/2015  . (No Known Allergies)    ROS:  General: Negative for anorexia, weight loss, fever, chills, fatigue, weakness. ENT: Negative for hoarseness, difficulty swallowing ,  nasal congestion. CV: Negative for chest pain, angina, palpitations, dyspnea on exertion, peripheral edema.  Respiratory: Negative for dyspnea at rest, dyspnea on exertion, cough, sputum, wheezing.  GI: See history of present illness. GU:  Negative for dysuria, hematuria, urinary incontinence, urinary frequency, nocturnal urination.  Endo: Negative for unusual weight change.    Physical Examination:   BP 107/64 mmHg  Pulse 72  Temp(Src) 97.3 F (36.3 C) (Oral)  Ht 5\' 4"  (1.626 m)  Wt 100 lb 6.4 oz (45.541 kg)  BMI 17.23 kg/m2  General: Well-nourished, well-developed in no acute distress.  Eyes: No icterus. Mouth: Oropharyngeal mucosa moist and pink , no lesions erythema or exudate. Lungs: Clear to auscultation bilaterally.  Heart: Regular rate and rhythm, no murmurs rubs or gallops.  Abdomen: Bowel sounds are normal, nontender, nondistended, no hepatosplenomegaly or masses, no abdominal bruits or hernia , no rebound or guarding.   Extremities: No lower extremity edema. No clubbing or deformities. Neuro: Alert and oriented x 4   Skin: Warm and dry, no jaundice.   Psych: Alert and cooperative, normal mood and affect.   Imaging Studies: No results found.

## 2015-06-05 NOTE — Assessment & Plan Note (Addendum)
Recent EGD with normal tubular esophagus. Status post impaired dilation. Some improvement in her dysphagia. Occasional problems with solid foods. Advised if her symptoms worsen, next step would be barium pill esophagram. She'll call and let me know. Otherwise see her back in one year.

## 2015-06-07 NOTE — Progress Notes (Signed)
cc'd to pcp 

## 2015-06-20 ENCOUNTER — Telehealth: Payer: Self-pay

## 2015-06-20 MED ORDER — DEXLANSOPRAZOLE 60 MG PO CPDR: 60.0000 mg | DELAYED_RELEASE_CAPSULE | Freq: Every day | ORAL | Status: DC

## 2015-06-20 NOTE — Telephone Encounter (Signed)
Pt is calling to inform us that the Protonix is not working for her and she is have side affects from it. She would like something different called in for her to [place]. Please advise

## 2015-06-20 NOTE — Telephone Encounter (Signed)
Sent in [employer] for [employer]. I could not tell if on formulary. Have patient let us know if has any trouble getting it filled.

## 2015-06-20 NOTE — Telephone Encounter (Signed)
Pt is aware.  

## 2015-07-25 DIAGNOSIS — J209 Acute bronchitis, unspecified: Secondary | ICD-10-CM | POA: Diagnosis not present

## 2015-07-25 DIAGNOSIS — J069 Acute upper respiratory infection, unspecified: Secondary | ICD-10-CM | POA: Diagnosis not present

## 2015-08-03 ENCOUNTER — Encounter: Payer: Self-pay | Admitting: Cardiology

## 2015-08-03 ENCOUNTER — Ambulatory Visit (INDEPENDENT_AMBULATORY_CARE_PROVIDER_SITE_OTHER): Payer: Medicare Other | Admitting: Cardiology

## 2015-08-03 VITALS — BP 119/67 | HR 71 | Ht 64.0 in | Wt 98.0 lb

## 2015-08-03 DIAGNOSIS — E789 Disorder of lipoprotein metabolism, unspecified: Secondary | ICD-10-CM

## 2015-08-03 DIAGNOSIS — R7309 Other abnormal glucose: Secondary | ICD-10-CM | POA: Diagnosis not present

## 2015-08-03 DIAGNOSIS — Z13220 Encounter for screening for lipoid disorders: Secondary | ICD-10-CM

## 2015-08-03 DIAGNOSIS — R002 Palpitations: Secondary | ICD-10-CM | POA: Diagnosis not present

## 2015-08-03 DIAGNOSIS — Z131 Encounter for screening for diabetes mellitus: Secondary | ICD-10-CM

## 2015-08-03 NOTE — Progress Notes (Signed)
Patient ID: Katelyn [name], female   DOB: 1941/04/15, 74 y.o.   MRN: [id number]     Clinical Summary Katelyn [name] is a 74 y.o.female seen today for follow up of the following medical problems. of the following medical problems.   1. Palpitations - heart palpitations occurs few times a month. Feeling of heart racing and SOB, lasts few minutes. Can come on any time. - 1 cup coffee in AM, occasional iced tea, no sodas, no energy drinks, no EtoH  2. Chest pain - 01/2015 Lexiscan MPI without ischemia - no current symptoms   Past Medical History  Diagnosis Date  . Asthma   . COPD (chronic obstructive pulmonary disease)   . S/P colonoscopy 2009    dr. Gala Romney: anal papilla, 2 cecal AVMs. Due in 2014  . Dysphagia     requiring multiple dilations in past  . S/P endoscopy 2012    Dr. Gala Romney: mild chronic gastritis, bulbar erosions, s/p 54-French Maloney dilation   . S/P endoscopy 2003, 2004, 2007    gastritis, duodenitis, s/p dilation, +H.pylori   . Complication of anesthesia     hard to wake     No Known Allergies   Current Outpatient Prescriptions  Medication Sig Dispense Refill  . ADVAIR DISKUS 250-50 MCG/DOSE AEPB Take 1 puff by mouth 2 (two) times daily.     Marland Kitchen albuterol (PROVENTIL HFA;VENTOLIN HFA) 108 (90 BASE) MCG/ACT inhaler Inhale 1 puff into the lungs every 6 (six) hours as needed for wheezing or shortness of breath.    . Calcium Carbonate-Vitamin D (CALCIUM + D PO) Take 500 mg by mouth daily.    Marland Kitchen dexlansoprazole (DEXILANT) 60 MG capsule Take 1 capsule (60 mg total) by mouth daily. 30 capsule 5  . Multiple Vitamin (MULTIVITAMIN) capsule Take 1 capsule by mouth daily.      . sodium chloride (OCEAN) 0.65 % SOLN nasal spray Place 1 spray into both nostrils as needed for congestion. (Patient not taking: Reported on 04/25/2015) 30 mL 0   No current facility-administered medications for this visit.     Past Surgical History  Procedure Laterality Date  . Appendectomy    . Inguinal hernia repair    . Tonsillectomy     . Total abdominal hysterectomy    . Cholecystectomy    . Esophagogastroduodenoscopy  04/02/2011    RMR: 1. Endoscopically normal -appearing esophagus status post passage of a Maloney dilator followed by biopsy. 2. Hiatal hernia, Mottling, and submucosal petechiae in teh gastric mucoas of uncertain significance status post biopsy. 3. Bulbar erosions as described above.   . Colonoscopy  10/10/2008    RMR: 1. Anal papilla, otherwise normal rectum. 2. Two cecal arteriovenous malformations, colonic mucosa apperaed normal.   . Colonoscopy N/A 05/10/2015    RMR: Rectal and colonic polyps removed as described above. Ascending colon AVMs . Rectal polyps were tubular adenomas. Next colonoscopy May 2021.  Marland Kitchen Esophagogastroduodenoscopy N/A 05/10/2015    RMR: Normal esophagus status post Maloney dilation. Small hiatal hernia. Abnormal gastric because of doubtful clinical significance status post biopsy (benign)  . Esophageal dilation N/A 05/10/2015    Procedure: ESOPHAGEAL DILATION;  Surgeon: Daneil Dolin, MD;  Location: [facility];  Service: Endoscopy;  Laterality: N/A;     No Known Allergies    Family History  Problem Relation Age of Onset  . Colon cancer Neg Hx   . Heart disease Mother   . Lung cancer Father      Social History Katelyn [name] reports that she has  been smoking Cigarettes.  She started smoking about 55 years ago. She has been smoking about 1.00 pack per day. She has never used smokeless tobacco. Katelyn [name] reports that she does not drink alcohol.   Review of Systems CONSTITUTIONAL: No weight loss, fever, chills, weakness or fatigue.  HEENT: Eyes: No visual loss, blurred vision, double vision or yellow sclerae.No hearing loss, sneezing, congestion, runny nose or sore throat.  SKIN: No rash or itching.  CARDIOVASCULAR: per HPI RESPIRATORY: No shortness of breath, cough or sputum.  GASTROINTESTINAL: No anorexia, nausea, vomiting or diarrhea. No abdominal pain or blood.    GENITOURINARY: No burning on urination, no polyuria NEUROLOGICAL: No headache, dizziness, syncope, paralysis, ataxia, numbness or tingling in the extremities. No change in bowel or bladder control.  MUSCULOSKELETAL: No muscle, back pain, joint pain or stiffness.  LYMPHATICS: No enlarged nodes. No history of splenectomy.  PSYCHIATRIC: No history of depression or anxiety.  ENDOCRINOLOGIC: No reports of sweating, cold or heat intolerance. No polyuria or polydipsia.  Marland Kitchen   Physical Examination Filed Vitals:   08/03/15 1050  BP: 119/67  Pulse: 71   Filed Vitals:   08/03/15 1050  Height: 5\' 4"  (1.626 m)  Weight: 98 lb (44.453 kg)    Gen: resting comfortably, no acute distress HEENT: no scleral icterus, pupils equal round and reactive, no palptable cervical adenopathy,  CV: RRR, no m/r/g no JVD, no carotid bruits Resp: Clear to auscultation bilaterally GI: abdomen is soft, non-tender, non-distended, normal bowel sounds, no hepatosplenomegaly MSK: extremities are warm, no edema.  Skin: warm, no rash Neuro:  no focal deficits Psych: appropriate affect   Diagnostic Studies  12/2014 GXT 1. Nondiagnostic stress EKG, EKG interpretation affected by significant artifact and baseline wander. Cannot accurately calculate Duke score based on available EKG tracings.  2. Dizziness and chest pain with stress of unclear etiology 3. Consider stress imaging to further evaluate possible cardiac etiology of chest pain.   01/2015 Lexiscan MPI IMPRESSION: 1. No reversible ischemia or infarction.  2. Normal left ventricular wall motion.  3. Left ventricular ejection fraction 77%  4. Low-risk stress test findings*.   Assessment and Plan  1. Palpitations - will obtain 2 week monitor to further evaluate   2. Chest pain - negative stress test earlier this year - no recent symptoms    F/u 1 month. Order annual labs.    Katelyn [name], M.D.

## 2015-08-03 NOTE — Patient Instructions (Signed)
Your physician recommends that you schedule a follow-up appointment in: 1 month with Dr. Harl Bowie  Your physician recommends that you continue on your current medications as directed. Please refer to the Current Medication list given to you today.  Your physician recommends that you return for lab work - please fast prior to blood work  Your physician has recommended that you wear an event monitor 14 DAYS. Event monitors are medical devices that record the heart's electrical activity. Doctors most often [place] these monitors to diagnose arrhythmias. Arrhythmias are problems with the speed or rhythm of the heartbeat. The monitor is a small, portable device. You can wear one while you do your normal daily activities. This is usually used to diagnose what is causing palpitations/syncope (passing out)  Thank you for choosing [facility]!!

## 2015-08-10 ENCOUNTER — Telehealth: Payer: Self-pay | Admitting: Cardiology

## 2015-08-10 ENCOUNTER — Other Ambulatory Visit: Payer: Self-pay | Admitting: Cardiology

## 2015-08-10 DIAGNOSIS — R7309 Other abnormal glucose: Secondary | ICD-10-CM | POA: Diagnosis not present

## 2015-08-10 DIAGNOSIS — Z13220 Encounter for screening for lipoid disorders: Secondary | ICD-10-CM | POA: Diagnosis not present

## 2015-08-10 DIAGNOSIS — R002 Palpitations: Secondary | ICD-10-CM | POA: Diagnosis not present

## 2015-08-10 DIAGNOSIS — E789 Disorder of lipoprotein metabolism, unspecified: Secondary | ICD-10-CM | POA: Diagnosis not present

## 2015-08-10 DIAGNOSIS — Z131 Encounter for screening for diabetes mellitus: Secondary | ICD-10-CM | POA: Diagnosis not present

## 2015-08-10 LAB — LIPID PANEL
CHOL/HDL RATIO: 2.6 ratio (ref <=5.0)
CHOLESTEROL: 174 mg/dL (ref 125–200)
HDL: 68 mg/dL (ref >=46)
LDL CALC: 92 mg/dL (ref <130)
TRIGLYCERIDES: 70 mg/dL (ref <150)
VLDL: 14 mg/dL (ref <30)

## 2015-08-10 LAB — CBC
HCT: 36.1 % (ref 36.0–46.0)
HEMOGLOBIN: 12 g/dL (ref 12.0–15.0)
MCH: 27.6 pg (ref 26.0–34.0)
MCHC: 33.2 g/dL (ref 30.0–36.0)
MCV: 83.2 fL (ref 78.0–100.0)
MPV: 10.3 fL (ref 8.6–12.4)
Platelets: 317 10*3/uL (ref 150–400)
RBC: 4.34 MIL/uL (ref 3.87–5.11)
RDW: 16 % — ABNORMAL HIGH (ref 11.5–15.5)
WBC: 5.8 10*3/uL (ref 4.0–10.5)

## 2015-08-10 LAB — TSH: TSH: 0.851 u[IU]/mL (ref 0.350–4.500)

## 2015-08-10 LAB — COMPREHENSIVE METABOLIC PANEL
ALBUMIN: 4 g/dL (ref 3.6–5.1)
ALT: 15 U/L (ref 6–29)
AST: 24 U/L (ref 10–35)
Alkaline Phosphatase: 84 U/L (ref 33–130)
BUN: 15 mg/dL (ref 7–25)
CHLORIDE: 105 mmol/L (ref 98–110)
CO2: 27 mmol/L (ref 20–31)
Calcium: 9 mg/dL (ref 8.6–10.4)
Creat: 0.9 mg/dL (ref 0.60–0.93)
Glucose, Bld: 96 mg/dL (ref 65–99)
POTASSIUM: 4.5 mmol/L (ref 3.5–5.3)
Sodium: 141 mmol/L (ref 135–146)
Total Bilirubin: 0.5 mg/dL (ref 0.2–1.2)
Total Protein: 6.4 g/dL (ref 6.1–8.1)

## 2015-08-10 LAB — HEMOGLOBIN A1C
Hgb A1c MFr Bld: 5.6 % (ref <5.7)
Mean Plasma Glucose: 114 mg/dL (ref <117)

## 2015-08-10 LAB — MAGNESIUM: Magnesium: 2 mg/dL (ref 1.5–2.5)

## 2015-08-10 NOTE — Telephone Encounter (Signed)
Patient states that she is not going to wear monitor due to insurance not covering it. / tg

## 2015-08-10 NOTE — Telephone Encounter (Signed)
Do we know why her insurance won't cover? She has frequent palpitations which is an indication  [name] MD

## 2015-08-10 NOTE — Telephone Encounter (Signed)
Pt

## 2015-08-10 NOTE — Telephone Encounter (Signed)
Pt says that medicare will not pay for monitor, pt has mailed monitor back. Will forward to Dr. Harl Bowie for further suggestions.

## 2015-08-10 NOTE — Telephone Encounter (Signed)
Spoke with Kristopher Oppenheim ID# [id number] and could not give me any information on why pt was denied, says that company that sent monitor needs to call. Called Mitch ([employer]) who will f/u with this and let me know

## 2015-08-10 NOTE — Telephone Encounter (Signed)
Mitch from [employer] says that he spoke with claims supervisor with medicare [place] and will take care of this early next week. Pt soon to be approved and will reorder Monitor. Will forward to Dr. Harl Bowie as Juluis Rainier

## 2015-08-11 ENCOUNTER — Telehealth: Payer: Self-pay | Admitting: *Deleted

## 2015-08-11 NOTE — Telephone Encounter (Signed)
Pt aware, routed to pcp 

## 2015-08-11 NOTE — Telephone Encounter (Signed)
-----   Message from Arnoldo Lenis, MD sent at 08/11/2015  9:44 AM EDT ----- All labs look good  [name] MD

## 2015-09-04 ENCOUNTER — Emergency Department (HOSPITAL_COMMUNITY): Payer: Medicare Other

## 2015-09-04 ENCOUNTER — Ambulatory Visit: Payer: Medicare Other | Admitting: Cardiology

## 2015-09-04 ENCOUNTER — Emergency Department (HOSPITAL_COMMUNITY)
Admission: EM | Admit: 2015-09-04 | Discharge: 2015-09-04 | Disposition: A | Discharge status: Home / Self Care | Payer: Medicare Other | Attending: Emergency Medicine | Admitting: Emergency Medicine

## 2015-09-04 ENCOUNTER — Encounter (HOSPITAL_COMMUNITY): Payer: Self-pay

## 2015-09-04 DIAGNOSIS — F17210 Nicotine dependence, cigarettes, uncomplicated: Secondary | ICD-10-CM | POA: Diagnosis not present

## 2015-09-04 DIAGNOSIS — J441 Chronic obstructive pulmonary disease with (acute) exacerbation: Secondary | ICD-10-CM | POA: Insufficient documentation

## 2015-09-04 DIAGNOSIS — R0602 Shortness of breath: Secondary | ICD-10-CM | POA: Diagnosis present

## 2015-09-04 DIAGNOSIS — Z79899 Other long term (current) drug therapy: Secondary | ICD-10-CM | POA: Diagnosis not present

## 2015-09-04 DIAGNOSIS — Z720 Tobacco use: Secondary | ICD-10-CM | POA: Diagnosis not present

## 2015-09-04 DIAGNOSIS — J449 Chronic obstructive pulmonary disease, unspecified: Secondary | ICD-10-CM | POA: Diagnosis not present

## 2015-09-04 DIAGNOSIS — R05 Cough: Secondary | ICD-10-CM | POA: Diagnosis not present

## 2015-09-04 LAB — CBC WITH DIFFERENTIAL/PLATELET
Basophils Absolute: 0.1 10*3/uL (ref 0.0–0.1)
Basophils Relative: 1 % (ref 0–1)
Eosinophils Absolute: 0.5 10*3/uL (ref 0.0–0.7)
Eosinophils Relative: 6 % — ABNORMAL HIGH (ref 0–5)
HCT: 36.6 % (ref 36.0–46.0)
HEMOGLOBIN: 12.1 g/dL (ref 12.0–15.0)
LYMPHS ABS: 3.7 10*3/uL (ref 0.7–4.0)
LYMPHS PCT: 46 % (ref 12–46)
MCH: 28.3 pg (ref 26.0–34.0)
MCHC: 33.1 g/dL (ref 30.0–36.0)
MCV: 85.7 fL (ref 78.0–100.0)
Monocytes Absolute: 0.8 10*3/uL (ref 0.1–1.0)
Monocytes Relative: 11 % (ref 3–12)
NEUTROS PCT: 36 % — AB (ref 43–77)
Neutro Abs: 2.8 10*3/uL (ref 1.7–7.7)
Platelets: 311 10*3/uL (ref 150–400)
RBC: 4.27 MIL/uL (ref 3.87–5.11)
RDW: 16.2 % — ABNORMAL HIGH (ref 11.5–15.5)
WBC: 7.9 10*3/uL (ref 4.0–10.5)

## 2015-09-04 LAB — BASIC METABOLIC PANEL
Anion gap: 6 (ref 5–15)
BUN: 16 mg/dL (ref 6–20)
CHLORIDE: 106 mmol/L (ref 101–111)
CO2: 27 mmol/L (ref 22–32)
Calcium: 8.7 mg/dL — ABNORMAL LOW (ref 8.9–10.3)
Creatinine, Ser: 0.92 mg/dL (ref 0.44–1.00)
GFR calc Af Amer: >60 mL/min (ref >60)
GFR calc non Af Amer: >60 mL/min (ref >60)
Glucose, Bld: 102 mg/dL — ABNORMAL HIGH (ref 65–99)
POTASSIUM: 4 mmol/L (ref 3.5–5.1)
SODIUM: 139 mmol/L (ref 135–145)

## 2015-09-04 MED ORDER — PREDNISONE 50 MG PO TABS
60.0000 mg | ORAL_TABLET | Freq: Once | ORAL | Status: AC
  Administered 2015-09-04: 60 mg via ORAL
  Filled 2015-09-04: qty 1

## 2015-09-04 MED ORDER — ALBUTEROL SULFATE (2.5 MG/3ML) 0.083% IN NEBU
2.5000 mg | INHALATION_SOLUTION | Freq: Once | RESPIRATORY_TRACT | Status: AC
  Administered 2015-09-04: 2.5 mg via RESPIRATORY_TRACT
  Filled 2015-09-04: qty 3

## 2015-09-04 MED ORDER — IPRATROPIUM-ALBUTEROL 0.5-2.5 (3) MG/3ML IN SOLN
3.0000 mL | Freq: Once | RESPIRATORY_TRACT | Status: AC
  Administered 2015-09-04: 3 mL via RESPIRATORY_TRACT
  Filled 2015-09-04: qty 3

## 2015-09-04 MED ORDER — PREDNISONE 20 MG PO TABS
ORAL_TABLET | ORAL | Status: AC
  Filled 2015-09-04: qty 3

## 2015-09-04 MED ORDER — PREDNISONE 10 MG PO TABS: 60.0000 mg | ORAL_TABLET | Freq: Every day | ORAL | Status: DC

## 2015-09-04 MED ORDER — IPRATROPIUM BROMIDE 0.02 % IN SOLN: 0.5000 mg | Freq: Once | RESPIRATORY_TRACT | Status: DC

## 2015-09-04 MED ORDER — ALBUTEROL SULFATE HFA 108 (90 BASE) MCG/ACT IN AERS: 2.0000 | INHALATION_SPRAY | Freq: Four times a day (QID) | RESPIRATORY_TRACT | Status: DC | PRN

## 2015-09-04 MED ORDER — ALBUTEROL SULFATE (2.5 MG/3ML) 0.083% IN NEBU: 5.0000 mg | INHALATION_SOLUTION | Freq: Once | RESPIRATORY_TRACT | Status: DC

## 2015-09-04 MED ORDER — ALBUTEROL SULFATE (2.5 MG/3ML) 0.083% IN NEBU: 2.5000 mg | INHALATION_SOLUTION | RESPIRATORY_TRACT | Status: DC | PRN

## 2015-09-04 NOTE — ED Notes (Signed)
Respiratory notified to administer breathing tx, pharmacy called to deliver prednisone.

## 2015-09-04 NOTE — ED Provider Notes (Signed)
CSN: [id number]     Arrival date & time 09/04/15  1245 History  This chart was scribed for Katelyn Schmidt, MD by Terressa Koyanagi, ED Scribe. This patient was seen in room [facility] and the patient's care was started at 8:09 AM.   Chief Complaint  Patient presents with  . Shortness of Breath   The history is provided by the patient. No language interpreter was used.   PCP: Lanette Hampshire, MD HPI Comments: Katelyn [name] is a 74 y.o. female, with PMHx noted below including COPD and daily tobacco use (1ppd), who presents to the Emergency Department complaining of intermittent shortness of breath with associated cough onset a few days ago and worsening this morning. Pt reports she is out of her nebulize solution and instead used her rescue/emergency nebulizer without improvement. Pt reports she was started on Anoro (umeclidinium and vilanterol inhalation powder) one month ago, without improvement. Pt denies O2 use at home.   Past Medical History  Diagnosis Date  . Asthma   . COPD (chronic obstructive pulmonary disease)   . S/P colonoscopy 2009    dr. Gala Romney: anal papilla, 2 cecal AVMs. Due in 2014  . Dysphagia     requiring multiple dilations in past  . S/P endoscopy 2012    Dr. Gala Romney: mild chronic gastritis, bulbar erosions, s/p 54-French Maloney dilation   . S/P endoscopy 2003, 2004, 2007    gastritis, duodenitis, s/p dilation, +H.pylori   . Complication of anesthesia     hard to wake   Past Surgical History  Procedure Laterality Date  . Appendectomy    . Inguinal hernia repair    . Tonsillectomy    . Total abdominal hysterectomy    . Cholecystectomy    . Esophagogastroduodenoscopy  04/02/2011    RMR: 1. Endoscopically normal -appearing esophagus status post passage of a Maloney dilator followed by biopsy. 2. Hiatal hernia, Mottling, and submucosal petechiae in teh gastric mucoas of uncertain significance status post biopsy. 3. Bulbar erosions as described above.   . Colonoscopy   10/10/2008    RMR: 1. Anal papilla, otherwise normal rectum. 2. Two cecal arteriovenous malformations, colonic mucosa apperaed normal.   . Colonoscopy N/A 05/10/2015    RMR: Rectal and colonic polyps removed as described above. Ascending colon AVMs . Rectal polyps were tubular adenomas. Next colonoscopy May 2021.  Marland Kitchen Esophagogastroduodenoscopy N/A 05/10/2015    RMR: Normal esophagus status post Maloney dilation. Small hiatal hernia. Abnormal gastric because of doubtful clinical significance status post biopsy (benign)  . Esophageal dilation N/A 05/10/2015    Procedure: ESOPHAGEAL DILATION;  Surgeon: Daneil Dolin, MD;  Location: [facility];  Service: Endoscopy;  Laterality: N/A;   Family History  Problem Relation Age of Onset  . Colon cancer Neg Hx   . Heart disease Mother   . Lung cancer Father    Social History  Substance Use Topics  . Smoking status: Current Every Day Smoker -- 1.00 packs/day    Types: Cigarettes    Start date: 08/18/1959  . Smokeless tobacco: Never Used  . Alcohol Use: No   OB History    No data available     Review of Systems  Constitutional: Negative for fever and chills.  Respiratory: Positive for cough and shortness of breath.   All other systems reviewed and are negative.     Allergies  Review of patient's allergies indicates no known allergies.  Home Medications   Prior to Admission medications   Medication Sig Start  Date End Date Taking? Authorizing Provider  ADVAIR DISKUS 250-50 MCG/DOSE AEPB Take 1 puff by mouth 2 (two) times daily.  03/21/11   Historical Provider, MD  Calcium Carbonate-Vitamin D (CALCIUM + D PO) Take 500 mg by mouth daily.    Historical Provider, MD  dexlansoprazole (DEXILANT) 60 MG capsule Take 1 capsule (60 mg total) by mouth daily. 06/20/15   Mahala Menghini, PA-C  Multiple Vitamin (MULTIVITAMIN) capsule Take 1 capsule by mouth daily.      Historical Provider, MD  sodium chloride (OCEAN) 0.65 % SOLN nasal spray Place 1  spray into both nostrils as needed for congestion. 08/08/14   Merryl Hacker, MD   Triage Vitals: BP 136/77 mmHg  Pulse 67  Temp(Src) 97.8 F (36.6 C) (Oral)  Resp 26  Ht 5\' 4"  (1.626 m)  Wt 97 lb (43.999 kg)  BMI 16.64 kg/m2  SpO2 96% Physical Exam  Constitutional: She is oriented to person, place, and time. She appears well-developed and well-nourished. No distress.  HENT:  Head: Normocephalic and atraumatic.  Eyes: EOM are normal.  Neck: Normal range of motion.  Cardiovascular: Normal rate, regular rhythm and normal heart sounds.   Pulmonary/Chest: Effort normal. She has wheezes (wheezing bilaterally). She has no rhonchi.  Abdominal: Soft. She exhibits no distension. There is no tenderness.  Musculoskeletal: Normal range of motion.  Neurological: She is alert and oriented to person, place, and time.  Skin: Skin is warm and dry.  Psychiatric: She has a normal mood and affect. Judgment normal.  Nursing note and vitals reviewed.   ED Course  Procedures (including critical care time) DIAGNOSTIC STUDIES: Oxygen Saturation is 96% on RA, nl by my interpretation.    COORDINATION OF CARE: 8:14 AM: Discussed treatment plan with pt at bedside; patient verbalizes understanding and agrees with treatment plan. 9:52 AM: Recheck, pt resting comfortably reports she is feeling better.   Labs Review Labs Reviewed  CBC WITH DIFFERENTIAL/PLATELET - Abnormal; Notable for the following:    RDW 16.2 (*)    Neutrophils Relative % 36 (*)    Eosinophils Relative 6 (*)    All other components within normal limits  BASIC METABOLIC PANEL - Abnormal; Notable for the following:    Glucose, Bld 102 (*)    Calcium 8.7 (*)    All other components within normal limits    Imaging Review Dg Chest 2 View  09/04/2015   CLINICAL DATA:  Cough and shortness of breath for several days.  EXAM: CHEST  2 VIEW  COMPARISON:  October 03, 2014  FINDINGS: The heart size and mediastinal contours are within normal  limits. There is no focal infiltrate, pulmonary edema, or pleural effusion. The lungs are hyperinflated. The visualized skeletal structures are stable.  IMPRESSION: No active cardiopulmonary disease.  COPD unchanged.   Electronically Signed   By: Abelardo Diesel M.D.   On: 09/04/2015 08:46   I have personally reviewed and evaluated these images and lab results as part of my medical decision-making.   EKG Interpretation   Date/Time:  Monday September 04 2015 07:49:33 EDT Ventricular Rate:  68 PR Interval:  180 QRS Duration: 94 QT Interval:  392 QTC Calculation: 417 R Axis:   56 Text Interpretation:  Sinus rhythm RSR' in V1 or V2, right VCD or RVH No  significant change was found Confirmed by Sumit Branham  MD, Lennette Bihari ([id number]) on  09/04/2015 8:15:05 AM      MDM   Final diagnoses:  None   Patient  feels much better after nebulized treatments in emergency department.  Discharge home with primary care follow-up.  Steroids and albuterol for home.  Strict return precautions given primary care follow-up.  Patient understands return the ER for new or worsening symptoms  I personally performed the services described in this documentation, which was scribed in my presence. The recorded information has been reviewed and is accurate.      Katelyn Schmidt, MD 09/04/15 [phone]

## 2015-09-04 NOTE — ED Notes (Signed)
Pt c/o sob and cough for the past few days.  Pt has audible expiratory wheezing.

## 2015-09-04 NOTE — Discharge Instructions (Signed)

## 2015-09-08 DIAGNOSIS — J449 Chronic obstructive pulmonary disease, unspecified: Secondary | ICD-10-CM | POA: Diagnosis not present

## 2015-09-08 DIAGNOSIS — J209 Acute bronchitis, unspecified: Secondary | ICD-10-CM | POA: Diagnosis not present

## 2015-09-21 DIAGNOSIS — Z23 Encounter for immunization: Secondary | ICD-10-CM | POA: Diagnosis not present

## 2015-09-21 DIAGNOSIS — J449 Chronic obstructive pulmonary disease, unspecified: Secondary | ICD-10-CM | POA: Diagnosis not present

## 2015-09-21 DIAGNOSIS — J209 Acute bronchitis, unspecified: Secondary | ICD-10-CM | POA: Diagnosis not present

## 2015-09-22 DIAGNOSIS — Z23 Encounter for immunization: Secondary | ICD-10-CM | POA: Diagnosis not present

## 2015-10-10 ENCOUNTER — Other Ambulatory Visit (HOSPITAL_COMMUNITY): Payer: Self-pay | Admitting: Family Medicine

## 2015-10-10 ENCOUNTER — Ambulatory Visit (HOSPITAL_COMMUNITY)
Admission: RE | Admit: 2015-10-10 | Discharge: 2015-10-10 | Disposition: A | Discharge status: Home / Self Care | Payer: Medicare Other | Source: Ambulatory Visit | Attending: Family Medicine | Admitting: Family Medicine

## 2015-10-10 DIAGNOSIS — J209 Acute bronchitis, unspecified: Secondary | ICD-10-CM | POA: Diagnosis not present

## 2015-10-10 DIAGNOSIS — M160 Bilateral primary osteoarthritis of hip: Secondary | ICD-10-CM | POA: Diagnosis not present

## 2015-10-10 DIAGNOSIS — T1490XA Injury, unspecified, initial encounter: Secondary | ICD-10-CM

## 2015-10-10 DIAGNOSIS — M25552 Pain in left hip: Secondary | ICD-10-CM | POA: Insufficient documentation

## 2015-10-10 DIAGNOSIS — J449 Chronic obstructive pulmonary disease, unspecified: Secondary | ICD-10-CM | POA: Diagnosis not present

## 2015-10-15 ENCOUNTER — Emergency Department (HOSPITAL_COMMUNITY): Payer: Medicare Other

## 2015-10-15 ENCOUNTER — Encounter (HOSPITAL_COMMUNITY): Payer: Self-pay | Admitting: Emergency Medicine

## 2015-10-15 ENCOUNTER — Emergency Department (HOSPITAL_COMMUNITY)
Admission: EM | Admit: 2015-10-15 | Discharge: 2015-10-15 | Disposition: A | Discharge status: Home / Self Care | Payer: Medicare Other | Attending: Emergency Medicine | Admitting: Emergency Medicine

## 2015-10-15 DIAGNOSIS — Z79899 Other long term (current) drug therapy: Secondary | ICD-10-CM | POA: Diagnosis not present

## 2015-10-15 DIAGNOSIS — J441 Chronic obstructive pulmonary disease with (acute) exacerbation: Secondary | ICD-10-CM | POA: Insufficient documentation

## 2015-10-15 DIAGNOSIS — R0789 Other chest pain: Secondary | ICD-10-CM | POA: Diagnosis not present

## 2015-10-15 DIAGNOSIS — R0602 Shortness of breath: Secondary | ICD-10-CM | POA: Diagnosis present

## 2015-10-15 DIAGNOSIS — F17210 Nicotine dependence, cigarettes, uncomplicated: Secondary | ICD-10-CM | POA: Insufficient documentation

## 2015-10-15 LAB — CBC WITH DIFFERENTIAL/PLATELET
BASOS PCT: 1 %
Basophils Absolute: 0.1 10*3/uL (ref 0.0–0.1)
EOS ABS: 0.3 10*3/uL (ref 0.0–0.7)
Eosinophils Relative: 5 %
HCT: 34.1 % — ABNORMAL LOW (ref 36.0–46.0)
HEMOGLOBIN: 11.3 g/dL — AB (ref 12.0–15.0)
Lymphocytes Relative: 47 %
Lymphs Abs: 2.8 10*3/uL (ref 0.7–4.0)
MCH: 28.4 pg (ref 26.0–34.0)
MCHC: 33.1 g/dL (ref 30.0–36.0)
MCV: 85.7 fL (ref 78.0–100.0)
Monocytes Absolute: 0.6 10*3/uL (ref 0.1–1.0)
Monocytes Relative: 10 %
NEUTROS PCT: 37 %
Neutro Abs: 2.3 10*3/uL (ref 1.7–7.7)
Platelets: 266 10*3/uL (ref 150–400)
RBC: 3.98 MIL/uL (ref 3.87–5.11)
RDW: 16 % — ABNORMAL HIGH (ref 11.5–15.5)
WBC: 6.1 10*3/uL (ref 4.0–10.5)

## 2015-10-15 LAB — COMPREHENSIVE METABOLIC PANEL
ALBUMIN: 3.8 g/dL (ref 3.5–5.0)
ALK PHOS: 76 U/L (ref 38–126)
ALT: 17 U/L (ref 14–54)
ANION GAP: 7 (ref 5–15)
AST: 28 U/L (ref 15–41)
BUN: 19 mg/dL (ref 6–20)
CALCIUM: 9 mg/dL (ref 8.9–10.3)
CO2: 28 mmol/L (ref 22–32)
Chloride: 108 mmol/L (ref 101–111)
Creatinine, Ser: 0.91 mg/dL (ref 0.44–1.00)
GFR calc Af Amer: >60 mL/min (ref >60)
GFR calc non Af Amer: >60 mL/min (ref >60)
GLUCOSE: 115 mg/dL — AB (ref 65–99)
Potassium: 3.5 mmol/L (ref 3.5–5.1)
SODIUM: 143 mmol/L (ref 135–145)
Total Bilirubin: 0.5 mg/dL (ref 0.3–1.2)
Total Protein: 6.2 g/dL — ABNORMAL LOW (ref 6.5–8.1)

## 2015-10-15 MED ORDER — ALBUTEROL (5 MG/ML) CONTINUOUS INHALATION SOLN
10.0000 mg/h | INHALATION_SOLUTION | Freq: Once | RESPIRATORY_TRACT | Status: AC
  Administered 2015-10-15: 10 mg/h via RESPIRATORY_TRACT
  Filled 2015-10-15: qty 20

## 2015-10-15 MED ORDER — PREDNISONE 50 MG PO TABS
60.0000 mg | ORAL_TABLET | Freq: Once | ORAL | Status: AC
  Administered 2015-10-15: 60 mg via ORAL
  Filled 2015-10-15 (×2): qty 1

## 2015-10-15 MED ORDER — ALBUTEROL SULFATE (2.5 MG/3ML) 0.083% IN NEBU
5.0000 mg | INHALATION_SOLUTION | Freq: Once | RESPIRATORY_TRACT | Status: AC
  Administered 2015-10-15: 5 mg via RESPIRATORY_TRACT
  Filled 2015-10-15: qty 6

## 2015-10-15 MED ORDER — AZITHROMYCIN 250 MG PO TABS: ORAL_TABLET | ORAL | Status: DC

## 2015-10-15 MED ORDER — IPRATROPIUM BROMIDE 0.02 % IN SOLN
0.5000 mg | Freq: Once | RESPIRATORY_TRACT | Status: AC
  Administered 2015-10-15: 0.5 mg via RESPIRATORY_TRACT
  Filled 2015-10-15: qty 2.5

## 2015-10-15 MED ORDER — PREDNISONE 10 MG PO TABS: 40.0000 mg | ORAL_TABLET | Freq: Every day | ORAL | Status: DC

## 2015-10-15 MED ORDER — FLUTICASONE-SALMETEROL 100-50 MCG/DOSE IN AEPB: 1.0000 | INHALATION_SPRAY | Freq: Two times a day (BID) | RESPIRATORY_TRACT | Status: DC

## 2015-10-15 NOTE — ED Provider Notes (Signed)
The patient is a 74 year old female with a history of COPD, she presents for shortness of breath which is been worsening over the last 4 days. She still smokes cigarettes, she has had mild coughing, she has had increased wheezing which is not improving with albuterol at home. She also uses Advair twice daily. Her symptoms are persistent, worse with ambulation, no associated fevers. On exam the patient has diffuse expiratory wheezing, she has tight decreased lung sounds, she is speaking in full sentences, she is thin, no peripheral edema, no tachycardia. She will get continuous albuterol nebulizer, prednisone, reevaluate.  Chest x-ray without acute findings  I have personally viewed and interpreted the imaging and agree with radiologist interpretation.  Meds given in ED:  Medications  albuterol (PROVENTIL) (2.5 MG/3ML) 0.083% nebulizer solution 5 mg (5 mg Nebulization Given 10/15/15 2013)  predniSONE (DELTASONE) tablet 60 mg (60 mg Oral Given 10/15/15 1945)  ipratropium (ATROVENT) nebulizer solution 0.5 mg (0.5 mg Nebulization Given 10/15/15 2013)  albuterol (PROVENTIL,VENTOLIN) solution continuous neb (10 mg/hr Nebulization Given 10/15/15 2053)    Discharge Medication List as of 10/15/2015 10:12 PM    START taking these medications   Details  azithromycin (ZITHROMAX Z-PAK) 250 MG tablet 500mg  PO day 1, 250mg  day 2-5, Print        Medical screening examination/treatment/procedure(s) were conducted as a shared visit with non-physician practitioner(s) and myself.  I personally evaluated the patient during the encounter.  Clinical Impression:   Final diagnoses:  COPD exacerbation ([place])         Noemi Chapel, MD 10/17/15 [phone]

## 2015-10-15 NOTE — ED Provider Notes (Signed)
CSN: [id number]     Arrival date & time 10/15/15  1922 History   First MD Initiated Contact with Patient 10/15/15 1929     Chief Complaint  Patient presents with  . Shortness of Breath     (Consider location/radiation/quality/duration/timing/severity/associated sxs/prior Treatment) HPI   Katelyn [name] is a 74 y.o. female, pt with history of COPD and Asthma, presents with shortness of breath that began 4 or 5 days ago. Occasional productive cough with clear sputum, clear rhinorrhea also present. No chest pain or other pain, N/V/C/D, fever/chills, or any other complaints. Pt has tried using her albuterol nebulizer everyday with no improvement.  Pt is a current everyday smoker.   Past Medical History  Diagnosis Date  . Asthma   . COPD (chronic obstructive pulmonary disease) ([place])   . S/P colonoscopy 2009    dr. Gala Romney: anal papilla, 2 cecal AVMs. Due in 2014  . Dysphagia     requiring multiple dilations in past  . S/P endoscopy 2012    Dr. Gala Romney: mild chronic gastritis, bulbar erosions, s/p 54-French Maloney dilation   . S/P endoscopy 2003, 2004, 2007    gastritis, duodenitis, s/p dilation, +H.pylori   . Complication of anesthesia     hard to wake   Past Surgical History  Procedure Laterality Date  . Appendectomy    . Inguinal hernia repair    . Tonsillectomy    . Total abdominal hysterectomy    . Cholecystectomy    . Esophagogastroduodenoscopy  04/02/2011    RMR: 1. Endoscopically normal -appearing esophagus status post passage of a Maloney dilator followed by biopsy. 2. Hiatal hernia, Mottling, and submucosal petechiae in teh gastric mucoas of uncertain significance status post biopsy. 3. Bulbar erosions as described above.   . Colonoscopy  10/10/2008    RMR: 1. Anal papilla, otherwise normal rectum. 2. Two cecal arteriovenous malformations, colonic mucosa apperaed normal.   . Colonoscopy N/A 05/10/2015    RMR: Rectal and colonic polyps removed as described above. Ascending  colon AVMs . Rectal polyps were tubular adenomas. Next colonoscopy May 2021.  Marland Kitchen Esophagogastroduodenoscopy N/A 05/10/2015    RMR: Normal esophagus status post Maloney dilation. Small hiatal hernia. Abnormal gastric because of doubtful clinical significance status post biopsy (benign)  . Esophageal dilation N/A 05/10/2015    Procedure: ESOPHAGEAL DILATION;  Surgeon: Daneil Dolin, MD;  Location: [facility];  Service: Endoscopy;  Laterality: N/A;   Family History  Problem Relation Age of Onset  . Colon cancer Neg Hx   . Heart disease Mother   . Lung cancer Father    Social History  Substance Use Topics  . Smoking status: Current Every Day Smoker -- 1.00 packs/day    Types: Cigarettes    Start date: 08/18/1959  . Smokeless tobacco: Never Used  . Alcohol Use: No   OB History    No data available     Review of Systems  Respiratory: Positive for chest tightness and shortness of breath.   Cardiovascular: Negative for palpitations.  Gastrointestinal: Negative for nausea, vomiting, abdominal pain, diarrhea and constipation.  Genitourinary: Negative for dysuria, urgency, frequency, hematuria, flank pain and pelvic pain.  Neurological: Negative for dizziness, syncope, weakness, light-headedness and numbness.  All other systems reviewed and are negative.     Allergies  Review of patient's allergies indicates no known allergies.  Home Medications   Prior to Admission medications   Medication Sig Start Date End Date Taking? Authorizing Provider  acetaminophen (TYLENOL) 650 MG  CR tablet Take 650 mg by mouth every 8 (eight) hours as needed for pain.   Yes Historical Provider, MD  albuterol (PROVENTIL HFA;VENTOLIN HFA) 108 (90 BASE) MCG/ACT inhaler Inhale 2 puffs into the lungs every 6 (six) hours as needed for wheezing or shortness of breath. 09/04/15  Yes [name], MD  albuterol (PROVENTIL) (2.5 MG/3ML) 0.083% nebulizer solution Take 3 mLs (2.5 mg total) by nebulization every 4  (four) hours as needed for wheezing or shortness of breath. 09/04/15  Yes [name], MD  Calcium Carbonate (CALCIUM 600 PO) Take 600 mg by mouth daily.   Yes Historical Provider, MD  Multiple Vitamin (MULTIVITAMIN WITH MINERALS) TABS tablet Take 1 tablet by mouth daily.   Yes Historical Provider, MD  azithromycin (ZITHROMAX Z-PAK) 250 MG tablet 500mg  PO day 1, 250mg  day 2-5 10/15/15   Sarann Tregre C Jakyah Bradby, PA-C  Fluticasone-Salmeterol (ADVAIR DISKUS) 100-50 MCG/DOSE AEPB Inhale 1 puff into the lungs 2 (two) times daily. 10/15/15   Meko Masterson C Kristain Filo, PA-C  predniSONE (DELTASONE) 10 MG tablet Take 4 tablets (40 mg total) by mouth daily. 10/15/15   Hailey Stormer C Margan Elias, PA-C   BP 113/69 mmHg  Pulse 78  Resp 17  Ht 5\' 4"  (1.626 m)  Wt 97 lb (43.999 kg)  BMI 16.64 kg/m2  SpO2 100% Physical Exam  Constitutional: She appears well-developed and well-nourished. No distress.  HENT:  Head: Normocephalic and atraumatic.  Eyes: Conjunctivae are normal. Pupils are equal, round, and reactive to light.  Cardiovascular: Normal rate, regular rhythm and normal heart sounds.   Pulmonary/Chest: No accessory muscle usage. No respiratory distress. She has wheezes in the right upper field, the right middle field, the right lower field, the left upper field, the left middle field and the left lower field.  Increased work of breathing, but speaks in full sentences. Initially tachypneic, but upon exam by provider pt has normal rate of breathing.   Abdominal: Soft. Bowel sounds are normal.  Musculoskeletal: She exhibits no edema or tenderness.  Neurological: She is alert.  Skin: Skin is warm and dry. She is not diaphoretic.  Nursing note and vitals reviewed.   ED Course  Procedures (including critical care time) Labs Review Labs Reviewed  CBC WITH DIFFERENTIAL/PLATELET - Abnormal; Notable for the following:    Hemoglobin 11.3 (*)    HCT 34.1 (*)    RDW 16.0 (*)    All other components within normal limits  COMPREHENSIVE  METABOLIC PANEL - Abnormal; Notable for the following:    Glucose, Bld 115 (*)    Total Protein 6.2 (*)    All other components within normal limits    Imaging Review Dg Chest Portable 1 View  10/15/2015  CLINICAL DATA:  74 year old female with shortness of breath. History of COPD and asthma. Smoker. EXAM: PORTABLE CHEST 1 VIEW COMPARISON:  Chest x-ray 09/04/2015. FINDINGS: Lungs again appear hyperexpanded with pruning of the pulmonary vasculature in the periphery, indicative of underlying emphysema. No acute consolidative airspace disease. No pleural effusions. Mild diffuse peribronchial cuffing. No evidence of pulmonary edema. Heart size is normal. Upper mediastinal contours are within normal limits. Atherosclerosis in the thoracic aorta. IMPRESSION: 1. No radiographic evidence of acute cardiopulmonary disease. 2. Diffuse peribronchial cuffing and emphysematous changes redemonstrated, compatible with the reported clinical history of COPD. 3. Atherosclerosis. Electronically Signed   By: Vinnie Langton M.D.   On: 10/15/2015 20:06   I have personally reviewed and evaluated these images and lab results as part of my medical decision-making.  EKG Interpretation   Date/Time:  Sunday October 15 2015 19:34:14 EDT Ventricular Rate:  79 PR Interval:  163 QRS Duration: 99 QT Interval:  384 QTC Calculation: 440 R Axis:   -75 Text Interpretation:  Sinus rhythm Left anterior fascicular block RSR' in  V1 or V2, right VCD or RVH since last tracing no significant change  Confirmed by Globe ([id number]) on 10/15/2015 7:37:20 PM      MDM   Final diagnoses:  COPD exacerbation ([place])    Katelyn [name] presents with shortness of breath for the past 4 or 5 days.  Findings and plan of care discussed with Noemi Chapel, MD.  Suspect acute COPD/Asthma exacerbation. Pt to receive continuous albuterol 10mg /hr, atrovent, and prednisone. Pt adds that she is on advair already at home, but just  didn't put it on her med list. EKG shows no significant changes from previous. If pt improves, will discharge pt with refill prescription for advair as well as prednisone. CXR shows no evidence of acute cardiopulmonary disease.  8:51 PM Pt reassessed. Still has increased work of breathing. Lungs still with wheezing, but with better air flow. Pt states she feels better, but not back to her normal.  10:02 PM Pt reassessed. Pt states she feels back to normal level of breathing. Pt has good air movement and minimal wheezing.  Pt to be ambulated with SpO2 and if pt stays stable, discharged with refills of her medications as well as azithromycin.  Pt SpO2 only dropped to 93% while ambulating. Pt showed no tachycardia, increased shortness of breath, or any other issues.   Lorayne Bender, PA-C 10/15/15 [address]acey [address], PA-C 10/15/15 2221  Noemi Chapel, MD 10/17/15 [phone]

## 2015-10-15 NOTE — Discharge Instructions (Signed)
You have been seen today for an exacerbation of your COPD/Asthma. You are being prescribed Advair, an inhaler to be taken daily with your albuterol. Follow up with PCP for chronic management. Return to ED should symptoms worsen.   Chronic Obstructive Pulmonary Disease Exacerbation Chronic obstructive pulmonary disease (COPD) is a common lung condition in which airflow from the lungs is limited. COPD is a general term that can be used to describe many different lung problems that limit airflow, including chronic bronchitis and emphysema. COPD exacerbations are episodes when breathing symptoms become much worse and require extra treatment. Without treatment, COPD exacerbations can be life threatening, and frequent COPD exacerbations can cause further damage to your lungs. CAUSES  Respiratory infections.  Exposure to smoke.  Exposure to air pollution, chemical fumes, or dust. Sometimes there is no apparent cause or trigger. RISK FACTORS  Smoking cigarettes.  Older age.  Frequent prior COPD exacerbations. SIGNS AND SYMPTOMS  Increased coughing.  Increased thick spit (sputum) production.  Increased wheezing.  Increased shortness of breath.  Rapid breathing.  Chest tightness. DIAGNOSIS Your medical history, a physical exam, and tests will help your health care provider make a diagnosis. Tests may include:  A chest X-ray.  Basic lab tests.  Sputum testing.  An arterial blood gas test. TREATMENT Depending on the severity of your COPD exacerbation, you may need to be admitted to a hospital for treatment. Some of the treatments commonly used to treat COPD exacerbations are:   Antibiotic medicines.  Bronchodilators. These are drugs that expand the air passages. They may be given with an inhaler or nebulizer. Spacer devices may be needed to help improve drug delivery.  Corticosteroid medicines.  Supplemental oxygen therapy.  Airway clearing techniques, such as noninvasive  ventilation (NIV) and positive expiratory pressure (PEP). These provide respiratory support through a mask or other noninvasive device. HOME CARE INSTRUCTIONS  Do not smoke. Quitting smoking is very important to prevent COPD from getting worse and exacerbations from happening as often.  Avoid exposure to all substances that irritate the airway, especially to tobacco smoke.  If you were prescribed an antibiotic medicine, finish it all even if you start to feel better.  Take all medicines as directed by your health care provider.It is important to use correct technique with inhaled medicines.  Drink enough fluids to keep your urine clear or pale yellow (unless you have a medical condition that requires fluid restriction).  Use a cool mist vaporizer. This makes it easier to clear your chest when you cough.  If you have a home nebulizer and oxygen, continue to use them as directed.  Maintain all necessary vaccinations to prevent infections.  Exercise regularly.  Eat a healthy diet.  Keep all follow-up appointments as directed by your health care provider. SEEK IMMEDIATE MEDICAL CARE IF:  You have worsening shortness of breath.  You have trouble talking.  You have severe chest pain.  You have blood in your sputum.  You have a fever.  You have weakness, vomit repeatedly, or faint.  You feel confused.  You continue to get worse. MAKE SURE YOU:  Understand these instructions.  Will watch your condition.  Will get help right away if you are not doing well or get worse.   This information is not intended to replace advice given to you by your health care provider. Make sure you discuss any questions you have with your health care provider.   Document Released: 10/06/2007 Document Revised: 12/30/2014 Document Reviewed: 08/13/2013 Elsevier Interactive  Patient Education 2016 [employer].

## 2015-10-15 NOTE — ED Notes (Signed)
Pt's RA sats 93 % while ambulating around nurse desk

## 2015-10-15 NOTE — ED Notes (Signed)
Pt states she has COPD and asthma, still smokes.  Been wheezing and extremely SOB for 3-4 days.

## 2015-10-15 NOTE — ED Notes (Signed)
Instructed pt to take all of antibiotics as prescribed. 

## 2015-10-23 DIAGNOSIS — J449 Chronic obstructive pulmonary disease, unspecified: Secondary | ICD-10-CM | POA: Diagnosis not present

## 2015-10-23 DIAGNOSIS — J209 Acute bronchitis, unspecified: Secondary | ICD-10-CM | POA: Diagnosis not present

## 2015-11-28 ENCOUNTER — Encounter (HOSPITAL_COMMUNITY): Payer: Self-pay | Admitting: Emergency Medicine

## 2015-11-28 ENCOUNTER — Inpatient Hospital Stay (HOSPITAL_COMMUNITY)
Admission: EM | Admit: 2015-11-28 | Discharge: 2015-11-30 | DRG: 190 | Disposition: A | Discharge status: Home / Self Care | Payer: Medicare Other | Attending: Family Medicine | Admitting: Family Medicine

## 2015-11-28 ENCOUNTER — Emergency Department (HOSPITAL_COMMUNITY): Payer: Medicare Other

## 2015-11-28 DIAGNOSIS — K2970 Gastritis, unspecified, without bleeding: Secondary | ICD-10-CM | POA: Diagnosis present

## 2015-11-28 DIAGNOSIS — R0902 Hypoxemia: Secondary | ICD-10-CM | POA: Diagnosis present

## 2015-11-28 DIAGNOSIS — K3189 Other diseases of stomach and duodenum: Secondary | ICD-10-CM

## 2015-11-28 DIAGNOSIS — J441 Chronic obstructive pulmonary disease with (acute) exacerbation: Secondary | ICD-10-CM | POA: Diagnosis not present

## 2015-11-28 DIAGNOSIS — Z801 Family history of malignant neoplasm of trachea, bronchus and lung: Secondary | ICD-10-CM | POA: Diagnosis not present

## 2015-11-28 DIAGNOSIS — K2950 Unspecified chronic gastritis without bleeding: Secondary | ICD-10-CM

## 2015-11-28 DIAGNOSIS — Z90710 Acquired absence of both cervix and uterus: Secondary | ICD-10-CM

## 2015-11-28 DIAGNOSIS — J9601 Acute respiratory failure with hypoxia: Secondary | ICD-10-CM

## 2015-11-28 DIAGNOSIS — J45909 Unspecified asthma, uncomplicated: Secondary | ICD-10-CM | POA: Diagnosis present

## 2015-11-28 DIAGNOSIS — Z8249 Family history of ischemic heart disease and other diseases of the circulatory system: Secondary | ICD-10-CM

## 2015-11-28 DIAGNOSIS — J9600 Acute respiratory failure, unspecified whether with hypoxia or hypercapnia: Secondary | ICD-10-CM | POA: Diagnosis not present

## 2015-11-28 DIAGNOSIS — F17210 Nicotine dependence, cigarettes, uncomplicated: Secondary | ICD-10-CM | POA: Diagnosis present

## 2015-11-28 DIAGNOSIS — R05 Cough: Secondary | ICD-10-CM | POA: Diagnosis not present

## 2015-11-28 DIAGNOSIS — R0602 Shortness of breath: Secondary | ICD-10-CM

## 2015-11-28 DIAGNOSIS — Z86010 Personal history of colonic polyps: Secondary | ICD-10-CM

## 2015-11-28 DIAGNOSIS — R1310 Dysphagia, unspecified: Secondary | ICD-10-CM

## 2015-11-28 DIAGNOSIS — R1319 Other dysphagia: Secondary | ICD-10-CM

## 2015-11-28 LAB — CBC WITH DIFFERENTIAL/PLATELET
Basophils Absolute: 0.1 10*3/uL (ref 0.0–0.1)
Basophils Relative: 1 %
EOS ABS: 0.3 10*3/uL (ref 0.0–0.7)
EOS PCT: 4 %
HCT: 36 % (ref 36.0–46.0)
HEMOGLOBIN: 11.9 g/dL — AB (ref 12.0–15.0)
LYMPHS ABS: 2.5 10*3/uL (ref 0.7–4.0)
Lymphocytes Relative: 39 %
MCH: 29.1 pg (ref 26.0–34.0)
MCHC: 33.1 g/dL (ref 30.0–36.0)
MCV: 88 fL (ref 78.0–100.0)
MONO ABS: 0.9 10*3/uL (ref 0.1–1.0)
MONOS PCT: 13 %
Neutro Abs: 2.7 10*3/uL (ref 1.7–7.7)
Neutrophils Relative %: 43 %
PLATELETS: 241 10*3/uL (ref 150–400)
RBC: 4.09 MIL/uL (ref 3.87–5.11)
RDW: 14.8 % (ref 11.5–15.5)
WBC: 6.4 10*3/uL (ref 4.0–10.5)

## 2015-11-28 LAB — COMPREHENSIVE METABOLIC PANEL
ALBUMIN: 4 g/dL (ref 3.5–5.0)
ALT: 20 U/L (ref 14–54)
AST: 33 U/L (ref 15–41)
Alkaline Phosphatase: 79 U/L (ref 38–126)
Anion gap: 12 (ref 5–15)
BILIRUBIN TOTAL: 0.5 mg/dL (ref 0.3–1.2)
BUN: 19 mg/dL (ref 6–20)
CO2: 24 mmol/L (ref 22–32)
Calcium: 9.6 mg/dL (ref 8.9–10.3)
Chloride: 103 mmol/L (ref 101–111)
Creatinine, Ser: 1.26 mg/dL — ABNORMAL HIGH (ref 0.44–1.00)
GFR calc Af Amer: 48 mL/min — ABNORMAL LOW (ref >60)
GFR calc non Af Amer: 42 mL/min — ABNORMAL LOW (ref >60)
GLUCOSE: 105 mg/dL — AB (ref 65–99)
POTASSIUM: 3.9 mmol/L (ref 3.5–5.1)
Sodium: 139 mmol/L (ref 135–145)
TOTAL PROTEIN: 6.9 g/dL (ref 6.5–8.1)

## 2015-11-28 LAB — BRAIN NATRIURETIC PEPTIDE: B NATRIURETIC PEPTIDE 5: 112 pg/mL — AB (ref 0.0–100.0)

## 2015-11-28 MED ORDER — AZITHROMYCIN 250 MG PO TABS
250.0000 mg | ORAL_TABLET | Freq: Every day | ORAL | Status: DC
  Administered 2015-11-29: 250 mg via ORAL
  Filled 2015-11-28: qty 1

## 2015-11-28 MED ORDER — AZITHROMYCIN 250 MG PO TABS
500.0000 mg | ORAL_TABLET | Freq: Every day | ORAL | Status: AC
  Administered 2015-11-28: 500 mg via ORAL
  Filled 2015-11-28: qty 2

## 2015-11-28 MED ORDER — SODIUM CHLORIDE 0.9 % IJ SOLN
3.0000 mL | Freq: Two times a day (BID) | INTRAMUSCULAR | Status: DC
  Administered 2015-11-28 – 2015-11-30 (×5): 3 mL via INTRAVENOUS

## 2015-11-28 MED ORDER — SODIUM CHLORIDE 0.9 % IJ SOLN: 3.0000 mL | INTRAMUSCULAR | Status: DC | PRN

## 2015-11-28 MED ORDER — ALBUTEROL SULFATE (2.5 MG/3ML) 0.083% IN NEBU: 2.5000 mg | INHALATION_SOLUTION | RESPIRATORY_TRACT | Status: DC | PRN

## 2015-11-28 MED ORDER — ALBUTEROL SULFATE (2.5 MG/3ML) 0.083% IN NEBU: 2.5000 mg | INHALATION_SOLUTION | Freq: Four times a day (QID) | RESPIRATORY_TRACT | Status: DC

## 2015-11-28 MED ORDER — IPRATROPIUM-ALBUTEROL 0.5-2.5 (3) MG/3ML IN SOLN
3.0000 mL | Freq: Four times a day (QID) | RESPIRATORY_TRACT | Status: DC
  Administered 2015-11-29: 3 mL via RESPIRATORY_TRACT
  Filled 2015-11-28: qty 3

## 2015-11-28 MED ORDER — METHYLPREDNISOLONE SODIUM SUCC 125 MG IJ SOLR
125.0000 mg | Freq: Once | INTRAMUSCULAR | Status: AC
  Administered 2015-11-28: 125 mg via INTRAVENOUS
  Filled 2015-11-28: qty 2

## 2015-11-28 MED ORDER — IPRATROPIUM BROMIDE 0.02 % IN SOLN: 0.5000 mg | Freq: Four times a day (QID) | RESPIRATORY_TRACT | Status: DC

## 2015-11-28 MED ORDER — SODIUM CHLORIDE 0.9 % IV SOLN
INTRAVENOUS | Status: DC
  Administered 2015-11-28: 04:00:00 via INTRAVENOUS

## 2015-11-28 MED ORDER — SODIUM CHLORIDE 0.9 % IV BOLUS (SEPSIS)
700.0000 mL | Freq: Once | INTRAVENOUS | Status: AC
  Administered 2015-11-28: 700 mL via INTRAVENOUS

## 2015-11-28 MED ORDER — DEXTROSE 5 % IV SOLN
1.0000 g | INTRAVENOUS | Status: DC
  Administered 2015-11-28 – 2015-11-29 (×2): 1 g via INTRAVENOUS
  Filled 2015-11-28 (×3): qty 10

## 2015-11-28 MED ORDER — SODIUM CHLORIDE 0.9 % IV SOLN: 250.0000 mL | INTRAVENOUS | Status: DC | PRN

## 2015-11-28 MED ORDER — METHYLPREDNISOLONE SODIUM SUCC 125 MG IJ SOLR
80.0000 mg | Freq: Two times a day (BID) | INTRAMUSCULAR | Status: DC
  Administered 2015-11-28 – 2015-11-30 (×5): 80 mg via INTRAVENOUS
  Filled 2015-11-28 (×4): qty 2

## 2015-11-28 MED ORDER — CETYLPYRIDINIUM CHLORIDE 0.05 % MT LIQD
7.0000 mL | Freq: Two times a day (BID) | OROMUCOSAL | Status: DC
  Administered 2015-11-28 – 2015-11-30 (×5): 7 mL via OROMUCOSAL

## 2015-11-28 MED ORDER — IPRATROPIUM BROMIDE 0.02 % IN SOLN
0.5000 mg | Freq: Once | RESPIRATORY_TRACT | Status: AC
  Administered 2015-11-28: 0.5 mg via RESPIRATORY_TRACT
  Filled 2015-11-28: qty 2.5

## 2015-11-28 MED ORDER — IPRATROPIUM-ALBUTEROL 0.5-2.5 (3) MG/3ML IN SOLN
3.0000 mL | Freq: Four times a day (QID) | RESPIRATORY_TRACT | Status: DC
  Administered 2015-11-28 (×3): 3 mL via RESPIRATORY_TRACT
  Filled 2015-11-28 (×3): qty 3

## 2015-11-28 MED ORDER — ALBUTEROL SULFATE (2.5 MG/3ML) 0.083% IN NEBU: 5.0000 mg | INHALATION_SOLUTION | RESPIRATORY_TRACT | Status: AC | PRN

## 2015-11-28 MED ORDER — ALBUTEROL (5 MG/ML) CONTINUOUS INHALATION SOLN
15.0000 mg/h | INHALATION_SOLUTION | Freq: Once | RESPIRATORY_TRACT | Status: AC
  Administered 2015-11-28: 15 mg/h via RESPIRATORY_TRACT
  Filled 2015-11-28: qty 20

## 2015-11-28 NOTE — H&P (Signed)
PCP:   Lanette Hampshire, MD   Chief Complaint:  Sob, wheezing  HPI: 74 yo female h/o copd, asthma comes in with 2 weeks of uri symptoms/allergies for 2 weeks now last several days has had worsening sob and wheezing despite using her albuterol nebulizer at home.  Pt smoking 1 ppd.  Denies any fevers.  Coughing some.  A lot of nasal congestion which she says happens this time of year usually.  No le edema or swelling.  Denies being on steroids or abx a lot.  Pt was going to be d/c home but failed ambulation, got hypoxic at 88% on RA.  But has overall had improvement in her sob and wheezing with treatment in the ED with steroids and nebs.  Referred for admission for her copde causing mild hypoxia.  Review of Systems:  Positive and negative as per HPI otherwise all other systems are negative  Past Medical History: Past Medical History  Diagnosis Date  . Asthma   . COPD (chronic obstructive pulmonary disease) ([place])   . S/P colonoscopy 2009    dr. Gala Romney: anal papilla, 2 cecal AVMs. Due in 2014  . Dysphagia     requiring multiple dilations in past  . S/P endoscopy 2012    Dr. Gala Romney: mild chronic gastritis, bulbar erosions, s/p 54-French Maloney dilation   . S/P endoscopy 2003, 2004, 2007    gastritis, duodenitis, s/p dilation, +H.pylori   . Complication of anesthesia     hard to wake   Past Surgical History  Procedure Laterality Date  . Appendectomy    . Inguinal hernia repair    . Tonsillectomy    . Total abdominal hysterectomy    . Cholecystectomy    . Esophagogastroduodenoscopy  04/02/2011    RMR: 1. Endoscopically normal -appearing esophagus status post passage of a Maloney dilator followed by biopsy. 2. Hiatal hernia, Mottling, and submucosal petechiae in teh gastric mucoas of uncertain significance status post biopsy. 3. Bulbar erosions as described above.   . Colonoscopy  10/10/2008    RMR: 1. Anal papilla, otherwise normal rectum. 2. Two cecal arteriovenous malformations,  colonic mucosa apperaed normal.   . Colonoscopy N/A 05/10/2015    RMR: Rectal and colonic polyps removed as described above. Ascending colon AVMs . Rectal polyps were tubular adenomas. Next colonoscopy May 2021.  Marland Kitchen Esophagogastroduodenoscopy N/A 05/10/2015    RMR: Normal esophagus status post Maloney dilation. Small hiatal hernia. Abnormal gastric because of doubtful clinical significance status post biopsy (benign)  . Esophageal dilation N/A 05/10/2015    Procedure: ESOPHAGEAL DILATION;  Surgeon: Daneil Dolin, MD;  Location: [facility];  Service: Endoscopy;  Laterality: N/A;    Medications: Prior to Admission medications   Medication Sig Start Date End Date Taking? Authorizing Provider  acetaminophen (TYLENOL) 650 MG CR tablet Take 650 mg by mouth every 8 (eight) hours as needed for pain.    Historical Provider, MD  albuterol (PROVENTIL HFA;VENTOLIN HFA) 108 (90 BASE) MCG/ACT inhaler Inhale 2 puffs into the lungs every 6 (six) hours as needed for wheezing or shortness of breath. 09/04/15   [name], MD  albuterol (PROVENTIL) (2.5 MG/3ML) 0.083% nebulizer solution Take 3 mLs (2.5 mg total) by nebulization every 4 (four) hours as needed for wheezing or shortness of breath. 09/04/15   [name], MD  azithromycin (ZITHROMAX Z-PAK) 250 MG tablet 500mg  PO day 1, 250mg  day 2-5 10/15/15   Shawn C Joy, PA-C  Calcium Carbonate (CALCIUM 600 PO) Take 600 mg by  mouth daily.    Historical Provider, MD  Fluticasone-Salmeterol (ADVAIR DISKUS) 100-50 MCG/DOSE AEPB Inhale 1 puff into the lungs 2 (two) times daily. 10/15/15   Shawn C Joy, PA-C  Multiple Vitamin (MULTIVITAMIN WITH MINERALS) TABS tablet Take 1 tablet by mouth daily.    Historical Provider, MD  predniSONE (DELTASONE) 10 MG tablet Take 4 tablets (40 mg total) by mouth daily. 10/15/15   Shawn C Joy, PA-C    Allergies:  No Known Allergies  Social History:  reports that she has been smoking Cigarettes.  She started smoking about 56 years  ago. She has been smoking about 1.00 pack per day. She has never used smokeless tobacco. She reports that she does not drink alcohol or use illicit drugs.  Family History: Family History  Problem Relation Age of Onset  . Colon cancer Neg Hx   . Heart disease Mother   . Lung cancer Father     Physical Exam: Filed Vitals:   11/28/15 0224 11/28/15 0230 11/28/15 0300 11/28/15 0330  BP:  125/67 139/66 116/74  Pulse:  71 82 79  Temp:      TempSrc:      Resp:   25 23  Height:      Weight:      SpO2: 100% 98% 99% 93%   General appearance: alert, cooperative and no distress Head: Normocephalic, without obvious abnormality, atraumatic Eyes: negative Nose: Nares normal. Septum midline. Mucosa normal. No drainage or sinus tenderness. Neck: no JVD and supple, symmetrical, trachea midline Lungs: clear to auscultation bilaterally x mild inspiratory wheezing bilateral Heart: regular rate and rhythm, S1, S2 normal, no murmur, click, rub or gallop Abdomen: soft, non-tender; bowel sounds normal; no masses,  no organomegaly Extremities: extremities normal, atraumatic, no cyanosis or edema Pulses: 2+ and symmetric Skin: Skin color, texture, turgor normal. No rashes or lesions Neurologic: Grossly normal   Labs on Admission:   Recent Labs  11/28/15 0230  NA 139  K 3.9  CL 103  CO2 24  GLUCOSE 105*  BUN 19  CREATININE 1.26*  CALCIUM 9.6    Recent Labs  11/28/15 0230  AST 33  ALT 20  ALKPHOS 79  BILITOT 0.5  PROT 6.9  ALBUMIN 4.0    Recent Labs  11/28/15 0230  WBC 6.4  NEUTROABS 2.7  HGB 11.9*  HCT 36.0  MCV 88.0  PLT 241    Radiological Exams on Admission: Dg Chest Port 1 View  11/28/2015  CLINICAL DATA:  Acute onset of productive cough and sinus problems. Shortness of breath and wheezing. Initial encounter. EXAM: PORTABLE CHEST 1 VIEW COMPARISON:  Chest radiograph performed 10/15/2015 FINDINGS: The lungs are well-aerated. Mild vascular congestion is noted, with  mild peribronchial thickening. There is no evidence of focal opacification, pleural effusion or pneumothorax. The cardiomediastinal silhouette is borderline normal in size. No acute osseous abnormalities are seen. IMPRESSION: Mild vascular congestion, with mild peribronchial thickening. Lungs otherwise grossly clear. Electronically Signed   By: Garald Balding M.D.   On: 11/28/2015 02:44   cxr reviewed expanded lung fields no edema or infiltrate noted Old chart reviewed Case discussed with dr Tomi Bamberger iva  Assessment/Plan  74 yo female with mild copde, acute hypoxia respiratory failure with ambulation  Principal Problem:   COPD exacerbation ([place])-  Improved in ED with steroids and nebs.  But still desats with ambulation to 88%.  obs on medical.  Cont solumedrol 80mg  iv q 12 hours, freq nebs and start zpack.  Order oxygen measurements  with ambulation q shift.  Quit smoking, she is aware of this.  Active Problems:   Gastritis, chronic- noted stable   Acute respiratory failure with hypoxia ([place])- due to copde   SOB (shortness of breath)   Allergies - consider advising using otc allergy medications  obs on medical.  Full code.  Sherill Mangen A 11/28/2015, 4:38 AM

## 2015-11-28 NOTE — ED Provider Notes (Signed)
CSN: [id number]     Arrival date & time 11/28/15  0149 History   First MD Initiated Contact with Patient 11/28/15 0205   Chief Complaint  Patient presents with  . Shortness of Breath     (Consider location/radiation/quality/duration/timing/severity/associated sxs/prior Treatment) HPI patient reports she has a history of COPD, she continues to smoke. She has a nebulizer that she uses at home and usually gets good relief. She states however for the past 2 weeks she feels like her allergies and sinuses have been acting up. She has had a dry cough without fever. She's had some rhinorrhea that is clear. She denies sneezing or sore throat. She has been wheezing. She states initially her nebulizer was giving temporary relief however now it is not helping at all. She is not on oxygen at home.   PCP Dr. Everette Rank  Past Medical History  Diagnosis Date  . Asthma   . COPD (chronic obstructive pulmonary disease) ([place])   . S/P colonoscopy 2009    dr. Gala Romney: anal papilla, 2 cecal AVMs. Due in 2014  . Dysphagia     requiring multiple dilations in past  . S/P endoscopy 2012    Dr. Gala Romney: mild chronic gastritis, bulbar erosions, s/p 54-French Maloney dilation   . S/P endoscopy 2003, 2004, 2007    gastritis, duodenitis, s/p dilation, +H.pylori   . Complication of anesthesia     hard to wake   Past Surgical History  Procedure Laterality Date  . Appendectomy    . Inguinal hernia repair    . Tonsillectomy    . Total abdominal hysterectomy    . Cholecystectomy    . Esophagogastroduodenoscopy  04/02/2011    RMR: 1. Endoscopically normal -appearing esophagus status post passage of a Maloney dilator followed by biopsy. 2. Hiatal hernia, Mottling, and submucosal petechiae in teh gastric mucoas of uncertain significance status post biopsy. 3. Bulbar erosions as described above.   . Colonoscopy  10/10/2008    RMR: 1. Anal papilla, otherwise normal rectum. 2. Two cecal arteriovenous malformations, colonic  mucosa apperaed normal.   . Colonoscopy N/A 05/10/2015    RMR: Rectal and colonic polyps removed as described above. Ascending colon AVMs . Rectal polyps were tubular adenomas. Next colonoscopy May 2021.  Marland Kitchen Esophagogastroduodenoscopy N/A 05/10/2015    RMR: Normal esophagus status post Maloney dilation. Small hiatal hernia. Abnormal gastric because of doubtful clinical significance status post biopsy (benign)  . Esophageal dilation N/A 05/10/2015    Procedure: ESOPHAGEAL DILATION;  Surgeon: Daneil Dolin, MD;  Location: [facility];  Service: Endoscopy;  Laterality: N/A;   Family History  Problem Relation Age of Onset  . Colon cancer Neg Hx   . Heart disease Mother   . Lung cancer Father    Social History  Substance Use Topics  . Smoking status: Current Every Day Smoker -- 1.00 packs/day    Types: Cigarettes    Start date: 08/18/1959  . Smokeless tobacco: Never Used  . Alcohol Use: No   Lives at home Son lives with her  OB History    No data available     Review of Systems  All other systems reviewed and are negative.     Allergies  Review of patient's allergies indicates no known allergies.  Home Medications   Prior to Admission medications   Medication Sig Start Date End Date Taking? Authorizing Provider  acetaminophen (TYLENOL) 650 MG CR tablet Take 650 mg by mouth every 8 (eight) hours as needed for pain.  Historical Provider, MD  albuterol (PROVENTIL HFA;VENTOLIN HFA) 108 (90 BASE) MCG/ACT inhaler Inhale 2 puffs into the lungs every 6 (six) hours as needed for wheezing or shortness of breath. 09/04/15   [name], MD  albuterol (PROVENTIL) (2.5 MG/3ML) 0.083% nebulizer solution Take 3 mLs (2.5 mg total) by nebulization every 4 (four) hours as needed for wheezing or shortness of breath. 09/04/15   [name], MD  azithromycin (ZITHROMAX Z-PAK) 250 MG tablet 500mg  PO day 1, 250mg  day 2-5 10/15/15   Shawn C Joy, PA-C  Calcium Carbonate (CALCIUM 600 PO) Take 600 mg  by mouth daily.    Historical Provider, MD  Fluticasone-Salmeterol (ADVAIR DISKUS) 100-50 MCG/DOSE AEPB Inhale 1 puff into the lungs 2 (two) times daily. 10/15/15   Shawn C Joy, PA-C  Multiple Vitamin (MULTIVITAMIN WITH MINERALS) TABS tablet Take 1 tablet by mouth daily.    Historical Provider, MD  predniSONE (DELTASONE) 10 MG tablet Take 4 tablets (40 mg total) by mouth daily. 10/15/15   Lorayne Bender, PA-C    ED Triage Vitals  Enc Vitals Group     BP 11/28/15 0159 123/62 mmHg     Pulse Rate 11/28/15 0159 96     Resp 11/28/15 0159 36     Temp 11/28/15 0159 98 F (36.7 C)     Temp Source 11/28/15 0159 Oral     SpO2 11/28/15 0159 85 %     Weight 11/28/15 0159 100 lb (45.36 kg)     Height 11/28/15 0159 5\' 4"  (1.626 m)     Head Cir --      Peak Flow --      Pain Score --      Pain Loc --      Pain Edu? --      Excl. in [place]? --    Laboratory interpretation all normal except for hypoxia   Physical Exam  Constitutional: She is oriented to person, place, and time. She appears well-developed and well-nourished.  Non-toxic appearance. She does not appear ill. No distress.  HENT:  Head: Normocephalic and atraumatic.  Right Ear: External ear normal.  Left Ear: External ear normal.  Nose: Nose normal. No mucosal edema or rhinorrhea.  Mouth/Throat: Oropharynx is clear and moist and mucous membranes are normal. No dental abscesses or uvula swelling.  Eyes: Conjunctivae and EOM are normal. Pupils are equal, round, and reactive to light.  Neck: Normal range of motion and full passive range of motion without pain. Neck supple.  Cardiovascular: Normal rate, regular rhythm and normal heart sounds.  Exam reveals no gallop and no friction rub.   No murmur heard. Pulmonary/Chest: Accessory muscle usage present. Tachypnea noted. No respiratory distress. She has decreased breath sounds. She has wheezes. She has no rhonchi. She has no rales. She exhibits no tenderness and no crepitus.  Gets short of  breath while speaking  Abdominal: Soft. Normal appearance and bowel sounds are normal. She exhibits no distension. There is no tenderness. There is no rebound and no guarding.  Musculoskeletal: Normal range of motion. She exhibits no edema or tenderness.  Moves all extremities well.   Neurological: She is alert and oriented to person, place, and time. She has normal strength. No cranial nerve deficit.  Skin: Skin is warm, dry and intact. No rash noted. No erythema. No pallor.  Psychiatric: She has a normal mood and affect. Her speech is normal and behavior is normal. Her mood appears not anxious.  Nursing note and vitals reviewed.  ED Course  Procedures (including critical care time)  Medications  0.9 %  sodium chloride infusion ( Intravenous New Bag/Given 11/28/15 0338)  albuterol (PROVENTIL,VENTOLIN) solution continuous neb (15 mg/hr Nebulization Given 11/28/15 0224)  ipratropium (ATROVENT) nebulizer solution 0.5 mg (0.5 mg Nebulization Given 11/28/15 0224)  methylPREDNISolone sodium succinate (SOLU-MEDROL) 125 mg/2 mL injection 125 mg (125 mg Intravenous Given 11/28/15 0236)  sodium chloride 0.9 % bolus 700 mL (0 mLs Intravenous Stopped 11/28/15 0338)   Patient was hypoxic in triage. She was placed on oxygen by nasal cannula.  Patient was given IV steroids, and given a continuous nebulizer of albuterol and Atrovent.  Recheck at 3:45 AM patient has finished her continuous nebulizer she states she feels much better. When I listen to her lungs she has now clear. Patient was ambulated by nursing staff and without oxygen her pulse ox dropped to 88%.   I discussed admission with the patient due to her hypoxia. She is agreeable. She may need to have oxygen at home to use. Concern however is is that she still smokes 1 pack per day. She states the last time she was admitted her oxygen was low however she was not sent home on oxygen.  04:05 Dr Shanon Brow, admit to obs, med-surg, Dr Everette Rank  attending.  Labs Review Results for orders placed or performed during the hospital encounter of 11/28/15  Comprehensive metabolic panel  Result Value Ref Range   Sodium 139 135 - 145 mmol/L   Potassium 3.9 3.5 - 5.1 mmol/L   Chloride 103 101 - 111 mmol/L   CO2 24 22 - 32 mmol/L   Glucose, Bld 105 (H) 65 - 99 mg/dL   BUN 19 6 - 20 mg/dL   Creatinine, Ser 1.26 (H) 0.44 - 1.00 mg/dL   Calcium 9.6 8.9 - 10.3 mg/dL   Total Protein 6.9 6.5 - 8.1 g/dL   Albumin 4.0 3.5 - 5.0 g/dL   AST 33 15 - 41 U/L   ALT 20 14 - 54 U/L   Alkaline Phosphatase 79 38 - 126 U/L   Total Bilirubin 0.5 0.3 - 1.2 mg/dL   GFR calc non Af Amer 42 (L) >60 mL/min   GFR calc Af Amer 48 (L) >60 mL/min   Anion gap 12 5 - 15  CBC with Differential  Result Value Ref Range   WBC 6.4 4.0 - 10.5 K/uL   RBC 4.09 3.87 - 5.11 MIL/uL   Hemoglobin 11.9 (L) 12.0 - 15.0 g/dL   HCT 36.0 36.0 - 46.0 %   MCV 88.0 78.0 - 100.0 fL   MCH 29.1 26.0 - 34.0 pg   MCHC 33.1 30.0 - 36.0 g/dL   RDW 14.8 11.5 - 15.5 %   Platelets 241 150 - 400 K/uL   Neutrophils Relative % 43 %   Neutro Abs 2.7 1.7 - 7.7 K/uL   Lymphocytes Relative 39 %   Lymphs Abs 2.5 0.7 - 4.0 K/uL   Monocytes Relative 13 %   Monocytes Absolute 0.9 0.1 - 1.0 K/uL   Eosinophils Relative 4 %   Eosinophils Absolute 0.3 0.0 - 0.7 K/uL   Basophils Relative 1 %   Basophils Absolute 0.1 0.0 - 0.1 K/uL  Brain natriuretic peptide  Result Value Ref Range   B Natriuretic Peptide 112.0 (H) 0.0 - 100.0 pg/mL   Laboratory interpretation all normal except renal insufficiency     Imaging Review Dg Chest Port 1 View  11/28/2015  CLINICAL DATA:  Acute onset of productive  cough and sinus problems. Shortness of breath and wheezing. Initial encounter. EXAM: PORTABLE CHEST 1 VIEW COMPARISON:  Chest radiograph performed 10/15/2015 FINDINGS: The lungs are well-aerated. Mild vascular congestion is noted, with mild peribronchial thickening. There is no evidence of focal  opacification, pleural effusion or pneumothorax. The cardiomediastinal silhouette is borderline normal in size. No acute osseous abnormalities are seen. IMPRESSION: Mild vascular congestion, with mild peribronchial thickening. Lungs otherwise grossly clear. Electronically Signed   By: Garald Balding M.D.   On: 11/28/2015 02:44   I have personally reviewed and evaluated these images and lab results as part of my medical decision-making.   EKG Interpretation   Date/Time:  Tuesday November 28 2015 02:04:30 EST Ventricular Rate:  84 PR Interval:  155 QRS Duration: 98 QT Interval:  386 QTC Calculation: 456 R Axis:   -71 Text Interpretation:  Sinus rhythm LAD, consider left anterior fascicular  block RSR' in V1 or V2, right VCD or RVH No significant change since last  tracing 15 Oct 2015 Confirmed by [facility] ([facility])  MD-I, Nilam Quakenbush ([id number]) on 11/28/2015  2:25:59 AM      MDM   patient has COPD and continues to smoke 1 pack per day. She presents with 2 week history of worsening allergy symptoms and now shortness of breath and wheezing that is not responding to her nebulizers at home. She was hypoxic in triage and is not on oxygen at home. She improved with continuous nebulizer however when she was ambulated her pulse ox dropped to 88%. She was admitted for further treatment.    Final diagnoses:  COPD exacerbation ([place])  Hypoxia    Plan admission   CRITICAL CARE Performed by: Rolland Porter L Total critical care time: 32 minutes Critical care time was exclusive of separately billable procedures and treating other patients. Critical care was necessary to treat or prevent imminent or life-threatening deterioration. Critical care was time spent personally by me on the following activities: development of treatment plan with patient and/or surrogate as well as nursing, discussions with consultants, evaluation of patient's response to treatment, examination of patient, obtaining history from patient or surrogate,  ordering and performing treatments and interventions, ordering and review of laboratory studies, ordering and review of radiographic studies, pulse oximetry and re-evaluation of patient's condition.     Rolland Porter, MD 11/28/15 [phone]

## 2015-11-28 NOTE — ED Notes (Signed)
Patient states she has had a cough and sinus problems x 1 week. Complaining of shortness of breath and wheezing. Patient has audible wheezing at triage. Denies chest pain.

## 2015-11-28 NOTE — Progress Notes (Signed)
Patient admitted with worsening COPD exacerbation over several days despite increasing nebulizer therapy at home chest x-ray reveals no evidence of infiltrate patient was placed on Zithromax empirically as well as IV Solu-Medrol and nebulizer therapy Katelyn [name] [id number] DOB: [birth year] DOA: 11/28/2015 PCP: Lanette Hampshire, MD             Physical Exam: Blood pressure 113/64, pulse 94, temperature 97.9 F (36.6 C), temperature source Oral, resp. rate 21, height 5\' 4"  (1.626 m), weight 98 lb 15.8 oz (44.9 kg), SpO2 97 %. lungs show prolonged intrauterine phase diminished breath sounds in the bases mild and very wheeze scattered rhonchi no rales audible heart regular rhythm no S3 or S4 no heaves thrills rubs   Investigations:  No results found for this or any previous visit (from the past 240 hour(s)).   Basic Metabolic Panel:  Recent Labs  11/28/15 0230  NA 139  K 3.9  CL 103  CO2 24  GLUCOSE 105*  BUN 19  CREATININE 1.26*  CALCIUM 9.6   Liver Function Tests:  Recent Labs  11/28/15 0230  AST 33  ALT 20  ALKPHOS 79  BILITOT 0.5  PROT 6.9  ALBUMIN 4.0     CBC:  Recent Labs  11/28/15 0230  WBC 6.4  NEUTROABS 2.7  HGB 11.9*  HCT 36.0  MCV 88.0  PLT 241    Dg Chest Port 1 View  11/28/2015  CLINICAL DATA:  Acute onset of productive cough and sinus problems. Shortness of breath and wheezing. Initial encounter. EXAM: PORTABLE CHEST 1 VIEW COMPARISON:  Chest radiograph performed 10/15/2015 FINDINGS: The lungs are well-aerated. Mild vascular congestion is noted, with mild peribronchial thickening. There is no evidence of focal opacification, pleural effusion or pneumothorax. The cardiomediastinal silhouette is borderline normal in size. No acute osseous abnormalities are seen. IMPRESSION: Mild vascular congestion, with mild peribronchial thickening. Lungs otherwise grossly clear. Electronically Signed   By: Garald Balding M.D.   On: 11/28/2015 02:44       Medications:  Impression:  Principal Problem:   COPD exacerbation ([place]) Active Problems:   Gastritis, chronic   Acute respiratory failure with hypoxia ([facility])   SOB (shortness of breath)     Plan: Pulmonary consult requested we'll empirically add Rocephin to current Zithromax not sure these are required we'll defer to pulmonary continue IV Solu-Medrol 125 every 6 hours and nebulizer therapy  Consultants: Pulmonary requested   Procedures   Antibiotics: Rocephin and Zithromax                  Code Status: Full  Family Communication:  Spoke with patient at length  Disposition Plan see plan above  Time spent: 30 minutes     [name] M   11/28/2015, 7:04 AM

## 2015-11-28 NOTE — ED Notes (Signed)
Patient states she has used nebulizer and inhaler at home with no relief.

## 2015-11-28 NOTE — ED Notes (Signed)
Patient's O2 saturation 88% on room air during ambulation. Patient denies shortness of breath during ambulation.

## 2015-11-28 NOTE — Care Management Note (Signed)
Case Management Note  Patient Details  Name: Katelyn [name] MRN: [id number] Date of Birth: 1941/02/28  Subjective/Objective:                  Pt admitted from home with COPD exacerbation. Pt lives with family and will return home at discharge. Pt is independent with ADL's. Pt has a neb machine for home use.  Action/Plan: Will need home O2 assessment prior to discharge.   Expected Discharge Date:  11/29/15               Expected Discharge Plan:  Home/Self Care  In-House Referral:  NA  Discharge planning Services  CM Consult  Post Acute Care Choice:  NA Choice offered to:  NA  DME Arranged:    DME Agency:     HH Arranged:    HH Agency:     Status of Service:  Completed, signed off  Medicare Important Message Given:    Date Medicare IM Given:    Medicare IM give by:    Date Additional Medicare IM Given:    Additional Medicare Important Message give by:     If discussed at [place] of Stay Meetings, dates discussed:    Additional Comments:  Joylene Draft, RN 11/28/2015, 2:27 PM

## 2015-11-28 NOTE — ED Notes (Signed)
Patient's O2 saturation 85% on room air. Placed patient on 2L O2 via nasal canula. O2 saturation increased to 97%.

## 2015-11-29 MED ORDER — ALBUTEROL SULFATE (2.5 MG/3ML) 0.083% IN NEBU
2.5000 mg | INHALATION_SOLUTION | Freq: Four times a day (QID) | RESPIRATORY_TRACT | Status: DC
  Administered 2015-11-29 – 2015-11-30 (×4): 2.5 mg via RESPIRATORY_TRACT
  Filled 2015-11-29 (×4): qty 3

## 2015-11-29 MED ORDER — MOMETASONE FURO-FORMOTEROL FUM 100-5 MCG/ACT IN AERO
2.0000 | INHALATION_SPRAY | Freq: Two times a day (BID) | RESPIRATORY_TRACT | Status: DC
  Administered 2015-11-29 – 2015-11-30 (×3): 2 via RESPIRATORY_TRACT
  Filled 2015-11-29: qty 8.8

## 2015-11-29 MED ORDER — GUAIFENESIN ER 600 MG PO TB12
1200.0000 mg | ORAL_TABLET | Freq: Two times a day (BID) | ORAL | Status: DC
  Administered 2015-11-29 – 2015-11-30 (×3): 1200 mg via ORAL
  Filled 2015-11-29 (×3): qty 2

## 2015-11-29 MED ORDER — TIOTROPIUM BROMIDE MONOHYDRATE 18 MCG IN CAPS
18.0000 ug | ORAL_CAPSULE | Freq: Every day | RESPIRATORY_TRACT | Status: DC
  Administered 2015-11-29 – 2015-11-30 (×2): 18 ug via RESPIRATORY_TRACT
  Filled 2015-11-29: qty 5

## 2015-11-29 NOTE — Consult Note (Signed)
Katelyn [name], Katelyn [name] NO.:  [id number]  MEDICAL RECORD NO.:  [id number]  LOCATION:  [id number]                          FACILITY:  [facility]  PHYSICIAN:  Haniah Penny L. Luan [name], M.D.DATE OF BIRTH:  1941-10-01  DATE OF CONSULTATION: DATE OF DISCHARGE:                                CONSULTATION   Patient of Dr. Seward Grater.  This is a 74 year old, who came to the hospital with COPD exacerbation. Consultation is requested for COPD exacerbation.  She has been in and out of the hospital and on and off steroids and antibiotics at home. She has been using a nebulizer.  She has been hypoxic.  PAST MEDICAL HISTORY:  Her past medical history is positive for asthma/COPD.  She has dysphagia and has required multiple dilatations in the past.  PAST SURGICAL HISTORY:  Surgically, she has had an appendectomy, inguinal hernia repair, tonsillectomy, total abdominal hysterectomy, cholecystectomy, EGD, and colonoscopy, dilatation of her esophagus.  MEDICATIONS:  Home medications are reviewed.  Hospital medications are reviewed.  SOCIAL HISTORY:  She is still smoking about a package of cigarettes daily.  PHYSICAL EXAMINATION:  GENERAL:  She is awake and alert, on oxygen, looks fairly comfortable. HEENT:  Her pupils are reactive.  Nose and throat are clear.  Mucous membranes are moist. CHEST:  Some wheezing. HEART:  Regular without gallop. ABDOMEN:  Soft.  No masses are felt. EXTREMITIES:  No edema. CENTRAL NERVOUS SYSTEM:  Grossly intact.  Chest x-ray did not show pneumonia.  ASSESSMENT:  She has chronic obstructive pulmonary disease exacerbation and she is on appropriate treatment.  I have added a flutter valve, Mucinex, Spiriva, and we will take her off the ipratropium.  I will also restart her Advair.  I am going to see if she can ambulate in the halls.  Thank you for allowing me to see her with you.     Katelyn [name], M.D.     ELH/MEDQ  D:  11/29/2015  T:   11/29/2015  Job:  [id number]

## 2015-11-29 NOTE — Progress Notes (Signed)
Patient's room air saturation was 93 % at rest.

## 2015-11-29 NOTE — Progress Notes (Signed)
Patient seems markedly improved over yesterday. I added we'll decrease Solu-Medrol to 80 IV every 8 [name] [id number] DOB: 1941/11/29 DOA: 11/28/2015 PCP: Katelyn Hampshire, MD             Physical Exam: Blood pressure 117/59, pulse 77, temperature 97.7 F (36.5 C), temperature source Oral, resp. rate 21, height 5\' 4"  (1.626 m), weight 98 lb 4.8 oz (44.589 kg), SpO2 95 %. lungs mild inspiratory wheezes scattered rhonchi no rales appreciable heart regular rhythm no S3-S4 auscultated  Investigations:  No results found for this or any previous visit (from the past 240 hour(s)).   Basic Metabolic Panel:  Recent Labs  11/28/15 0230  NA 139  K 3.9  CL 103  CO2 24  GLUCOSE 105*  BUN 19  CREATININE 1.26*  CALCIUM 9.6   Liver Function Tests:  Recent Labs  11/28/15 0230  AST 33  ALT 20  ALKPHOS 79  BILITOT 0.5  PROT 6.9  ALBUMIN 4.0     CBC:  Recent Labs  11/28/15 0230  WBC 6.4  NEUTROABS 2.7  HGB 11.9*  HCT 36.0  MCV 88.0  PLT 241    Dg Chest Port 1 View  11/28/2015  CLINICAL DATA:  Acute onset of productive cough and sinus problems. Shortness of breath and wheezing. Initial encounter. EXAM: PORTABLE CHEST 1 VIEW COMPARISON:  Chest radiograph performed 10/15/2015 FINDINGS: The lungs are well-aerated. Mild vascular congestion is noted, with mild peribronchial thickening. There is no evidence of focal opacification, pleural effusion or pneumothorax. The cardiomediastinal silhouette is borderline normal in size. No acute osseous abnormalities are seen. IMPRESSION: Mild vascular congestion, with mild peribronchial thickening. Lungs otherwise grossly clear. Electronically Signed   By: Katelyn [name] M.D.   On: 11/28/2015 02:44      Medications:   Impression:  Principal Problem:   COPD exacerbation ([facility]) Active Problems:   Gastritis, chronic   Hypoxia   Acute respiratory failure with hypoxia ([facility]CC)   SOB (shortness of breath)     Plan:  Decrease Solu-Medrol 80 IV every 6 hours continues. Continue all current medicines look for discharge within 24 hours  Consultants: Pulmonary  Procedures  Antibiotics: Rocephin and Zithromax                  Code Status:   Family Communication:    Disposition Plan see plan above  Time spent: 30 minutes   LOS: 1 day   Katelyn [name] M   11/29/2015, 11:57 AM

## 2015-11-30 MED ORDER — PREDNISONE 20 MG PO TABS
50.0000 mg | ORAL_TABLET | Freq: Every day | ORAL | Status: DC
  Administered 2015-11-30: 50 mg via ORAL
  Filled 2015-11-30: qty 2

## 2015-11-30 MED ORDER — ALBUTEROL SULFATE (2.5 MG/3ML) 0.083% IN NEBU: 2.5000 mg | INHALATION_SOLUTION | Freq: Four times a day (QID) | RESPIRATORY_TRACT | Status: DC | PRN

## 2015-11-30 MED ORDER — TIOTROPIUM BROMIDE MONOHYDRATE 18 MCG IN CAPS: 18.0000 ug | ORAL_CAPSULE | Freq: Every day | RESPIRATORY_TRACT | Status: DC

## 2015-11-30 MED ORDER — PREDNISONE 50 MG PO TABS
50.0000 mg | ORAL_TABLET | Freq: Every day | ORAL | Status: DC
Start: 2015-11-30 — End: 2016-10-19

## 2015-11-30 MED ORDER — GUAIFENESIN ER 600 MG PO TB12: 600.0000 mg | ORAL_TABLET | Freq: Two times a day (BID) | ORAL | Status: DC

## 2015-11-30 NOTE — Care Management Important Message (Signed)
Important Message  Patient Details  Name: Katelyn [name] MRN: [id number] Date of Birth: September 29, 1941   Medicare Important Message Given:  N/A - LOS <3 / Initial given by admissions    Joylene Draft, RN 11/30/2015, 7:32 AM

## 2015-11-30 NOTE — Progress Notes (Signed)
She feels much better and is being discharged today. She has no new complaints. Exam shows that she is awake and alert and looks more comfortable her chest is clear. I think she is fine for discharge. She does need to be on Spiriva at home in addition to her other treatments.  I will be glad to follow her as an outpatient. I will sign off now. Thanks for allowing me to see her with you

## 2015-11-30 NOTE — Care Management Note (Signed)
Case Management Note  Patient Details  Name: Katelyn [name] MRN: [id number] Date of Birth: 1941/07/25  Subjective/Objective:                    Action/Plan:   Expected Discharge Date:  11/29/15               Expected Discharge Plan:  Home/Self Care  In-House Referral:  NA  Discharge planning Services  CM Consult  Post Acute Care Choice:  NA Choice offered to:  NA  DME Arranged:    DME Agency:     HH Arranged:    [place] Agency:     Status of Service:  Completed, signed off  Medicare Important Message Given:  N/A - LOS <3 / Initial given by admissions Date Medicare IM Given:    Medicare IM give by:    Date Additional Medicare IM Given:    Additional Medicare Important Message give by:     If discussed at [place] of Stay Meetings, dates discussed:    Additional Comments: Pt discharged home today. No CM needs noted. Pt does not qualify for home O2 at this time. Christinia Gully Taylor, RN 11/30/2015, 10:12 AM

## 2015-11-30 NOTE — Discharge Summary (Signed)
Physician Discharge Summary  Katelyn [name] [id number] DOB: [birth year] DOA: 11/28/2015  PCP: Alice Reichert, MD  Admit date: 11/28/2015 Discharge date: 11/30/2015   Recommendations for Outpatient Follow-up:  Continue with new [place] inhaler every 12 hours continue with Advair Diskus every 12 continue with albuterol nebulizer 4 times a day and to follow-up with Dr. Juanetta Gosling will myself until Dr. Megan Mans resumes her care Discharge Diagnoses:  Principal Problem:   COPD exacerbation (HCC) Active Problems:   Gastritis, chronic   Hypoxia   Acute respiratory failure with hypoxia (HCC)   SOB (shortness of breath)   Discharge Condition: Good  Filed Weights   11/28/15 0528 11/29/15 0628 11/30/15 0617  Weight: 98 lb 15.8 oz (44.9 kg) 98 lb 4.8 oz (44.589 kg) 101 lb 9.6 oz (46.085 kg)    History of present illness:  Patient is a middle-aged 74 year old white female appearing much younger than stated age who has severe chronic asthmatic bronchitis due to severe flareup coughing increasing dyspnea and sputum production presents for GER and respiratory distress was given aggressive nebulizer therapy intravenous steroids she had no infiltrate on chest x-ray and was seen in consultation by pulmonology who recommended the addition of Spiriva inhaler every 12 she progressed over 3-4 day period in hospital and was subsequently and discharged as stable on prednisone 50 mg per day for 7 days along with the aforementioned inhalers she is advised to follow-up with Dr. Juanetta Gosling myself until Dr. Megan Mans resume her care  Hospital Course:  See history of present illness above  Procedures:    Consultations:  Pulmonology  Discharge Instructions  Discharge Instructions    Discharge instructions    Complete by:  As directed      Discharge patient    Complete by:  As directed             Medication List    STOP taking these medications        acetaminophen 650 MG CR tablet  Commonly  known as:  TYLENOL     azithromycin 250 MG tablet  Commonly known as:  ZITHROMAX Z-PAK     ibuprofen 200 MG tablet  Commonly known as:  ADVIL,MOTRIN      TAKE these medications        albuterol (2.5 MG/3ML) 0.083% nebulizer solution  Commonly known as:  PROVENTIL  Take 3 mLs (2.5 mg total) by nebulization every 4 (four) hours as needed for wheezing or shortness of breath.     albuterol 108 (90 BASE) MCG/ACT inhaler  Commonly known as:  PROVENTIL HFA;VENTOLIN HFA  Inhale 2 puffs into the lungs every 6 (six) hours as needed for wheezing or shortness of breath.     CALCIUM 600 PO  Take 600 mg by mouth daily.     Fluticasone-Salmeterol 100-50 MCG/DOSE Aepb  Commonly known as:  ADVAIR DISKUS  Inhale 1 puff into the lungs 2 (two) times daily.     guaiFENesin 600 MG 12 hr tablet  Commonly known as:  MUCINEX  Take 1 tablet (600 mg total) by mouth 2 (two) times daily.     multivitamin with minerals Tabs tablet  Take 1 tablet by mouth daily.     predniSONE 50 MG tablet  Commonly known as:  DELTASONE  Take 1 tablet (50 mg total) by mouth daily with breakfast.     tiotropium 18 MCG inhalation capsule  Commonly known as:  SPIRIVA  Place 1 capsule (18 mcg total) into inhaler and inhale daily.  No Known Allergies    The results of significant diagnostics from this hospitalization (including imaging, microbiology, ancillary and laboratory) are listed below for reference.    Significant Diagnostic Studies: Dg Chest Port 1 View  11/28/2015  CLINICAL DATA:  Acute onset of productive cough and sinus problems. Shortness of breath and wheezing. Initial encounter. EXAM: PORTABLE CHEST 1 VIEW COMPARISON:  Chest radiograph performed 10/15/2015 FINDINGS: The lungs are well-aerated. Mild vascular congestion is noted, with mild peribronchial thickening. There is no evidence of focal opacification, pleural effusion or pneumothorax. The cardiomediastinal silhouette is borderline normal in  size. No acute osseous abnormalities are seen. IMPRESSION: Mild vascular congestion, with mild peribronchial thickening. Lungs otherwise grossly clear. Electronically Signed   By: Roanna Raider M.D.   On: 11/28/2015 02:44    Microbiology: No results found for this or any previous visit (from the past 240 hour(s)).   Labs: Basic Metabolic Panel:  Recent Labs Lab 11/28/15 0230  NA 139  K 3.9  CL 103  CO2 24  GLUCOSE 105*  BUN 19  CREATININE 1.26*  CALCIUM 9.6   Liver Function Tests:  Recent Labs Lab 11/28/15 0230  AST 33  ALT 20  ALKPHOS 79  BILITOT 0.5  PROT 6.9  ALBUMIN 4.0   No results for input(s): LIPASE, AMYLASE in the last 168 hours. No results for input(s): AMMONIA in the last 168 hours. CBC:  Recent Labs Lab 11/28/15 0230  WBC 6.4  NEUTROABS 2.7  HGB 11.9*  HCT 36.0  MCV 88.0  PLT 241   Cardiac Enzymes: No results for input(s): CKTOTAL, CKMB, CKMBINDEX, TROPONINI in the last 168 hours. BNP: BNP (last 3 results)  Recent Labs  11/28/15 0230  BNP 112.0*    ProBNP (last 3 results) No results for input(s): PROBNP in the last 8760 hours.  CBG: No results for input(s): GLUCAP in the last 168 hours.     Signed:  Jayon Matton Judie Petit  [facility] Pager: [phone] 11/30/2015, 6:49 AM

## 2015-11-30 NOTE — Progress Notes (Signed)
Discharged PT per MD order and protocol. Reviewed discharge teaching and handouts given. Prescriptions were given and explained. Pt verbalized understanding and left with all belongings. VSS. IV catheter D/C.  Patient wheeled down by staff member. Oswald Hillock, RN

## 2015-12-13 DIAGNOSIS — J449 Chronic obstructive pulmonary disease, unspecified: Secondary | ICD-10-CM | POA: Diagnosis not present

## 2016-01-08 DIAGNOSIS — K210 Gastro-esophageal reflux disease with esophagitis: Secondary | ICD-10-CM | POA: Diagnosis not present

## 2016-01-08 DIAGNOSIS — J449 Chronic obstructive pulmonary disease, unspecified: Secondary | ICD-10-CM | POA: Diagnosis not present

## 2016-01-08 DIAGNOSIS — F17200 Nicotine dependence, unspecified, uncomplicated: Secondary | ICD-10-CM | POA: Diagnosis not present

## 2016-02-13 DIAGNOSIS — Z0000 Encounter for general adult medical examination without abnormal findings: Secondary | ICD-10-CM | POA: Diagnosis not present

## 2016-02-13 DIAGNOSIS — Z23 Encounter for immunization: Secondary | ICD-10-CM | POA: Diagnosis not present

## 2016-02-14 DIAGNOSIS — J441 Chronic obstructive pulmonary disease with (acute) exacerbation: Secondary | ICD-10-CM | POA: Diagnosis not present

## 2016-02-14 DIAGNOSIS — R634 Abnormal weight loss: Secondary | ICD-10-CM | POA: Diagnosis not present

## 2016-02-14 DIAGNOSIS — Z0000 Encounter for general adult medical examination without abnormal findings: Secondary | ICD-10-CM | POA: Diagnosis not present

## 2016-02-14 DIAGNOSIS — E785 Hyperlipidemia, unspecified: Secondary | ICD-10-CM | POA: Diagnosis not present

## 2016-02-14 DIAGNOSIS — R0602 Shortness of breath: Secondary | ICD-10-CM | POA: Diagnosis not present

## 2016-02-14 DIAGNOSIS — F17200 Nicotine dependence, unspecified, uncomplicated: Secondary | ICD-10-CM | POA: Diagnosis not present

## 2016-02-16 LAB — FECAL OCCULT BLOOD, GUAIAC: Fecal Occult Blood: NEGATIVE

## 2016-02-19 DIAGNOSIS — Z1211 Encounter for screening for malignant neoplasm of colon: Secondary | ICD-10-CM | POA: Diagnosis not present

## 2016-02-26 DIAGNOSIS — L739 Follicular disorder, unspecified: Secondary | ICD-10-CM | POA: Diagnosis not present

## 2016-02-26 DIAGNOSIS — D225 Melanocytic nevi of trunk: Secondary | ICD-10-CM | POA: Diagnosis not present

## 2016-02-26 DIAGNOSIS — L570 Actinic keratosis: Secondary | ICD-10-CM | POA: Diagnosis not present

## 2016-02-26 DIAGNOSIS — X32XXXD Exposure to sunlight, subsequent encounter: Secondary | ICD-10-CM | POA: Diagnosis not present

## 2016-03-01 DIAGNOSIS — J449 Chronic obstructive pulmonary disease, unspecified: Secondary | ICD-10-CM | POA: Diagnosis not present

## 2016-03-01 DIAGNOSIS — F17200 Nicotine dependence, unspecified, uncomplicated: Secondary | ICD-10-CM | POA: Diagnosis not present

## 2016-03-26 DIAGNOSIS — N390 Urinary tract infection, site not specified: Secondary | ICD-10-CM | POA: Diagnosis not present

## 2016-05-08 ENCOUNTER — Encounter: Payer: Self-pay | Admitting: Internal Medicine

## 2016-05-13 DIAGNOSIS — J441 Chronic obstructive pulmonary disease with (acute) exacerbation: Secondary | ICD-10-CM | POA: Diagnosis not present

## 2016-05-13 DIAGNOSIS — R634 Abnormal weight loss: Secondary | ICD-10-CM | POA: Diagnosis not present

## 2016-05-31 ENCOUNTER — Emergency Department (HOSPITAL_COMMUNITY)
Admission: EM | Admit: 2016-05-31 | Discharge: 2016-06-01 | Disposition: A | Discharge status: Home / Self Care | Payer: Medicare Other | Attending: Emergency Medicine | Admitting: Emergency Medicine

## 2016-05-31 ENCOUNTER — Encounter (HOSPITAL_COMMUNITY): Payer: Self-pay | Admitting: *Deleted

## 2016-05-31 DIAGNOSIS — J45909 Unspecified asthma, uncomplicated: Secondary | ICD-10-CM | POA: Insufficient documentation

## 2016-05-31 DIAGNOSIS — Y999 Unspecified external cause status: Secondary | ICD-10-CM | POA: Diagnosis not present

## 2016-05-31 DIAGNOSIS — Y929 Unspecified place or not applicable: Secondary | ICD-10-CM | POA: Diagnosis not present

## 2016-05-31 DIAGNOSIS — M542 Cervicalgia: Secondary | ICD-10-CM | POA: Diagnosis present

## 2016-05-31 DIAGNOSIS — F17210 Nicotine dependence, cigarettes, uncomplicated: Secondary | ICD-10-CM | POA: Insufficient documentation

## 2016-05-31 DIAGNOSIS — X58XXXA Exposure to other specified factors, initial encounter: Secondary | ICD-10-CM | POA: Diagnosis not present

## 2016-05-31 DIAGNOSIS — S46811A Strain of other muscles, fascia and tendons at shoulder and upper arm level, right arm, initial encounter: Secondary | ICD-10-CM | POA: Insufficient documentation

## 2016-05-31 DIAGNOSIS — J449 Chronic obstructive pulmonary disease, unspecified: Secondary | ICD-10-CM | POA: Insufficient documentation

## 2016-05-31 DIAGNOSIS — Y939 Activity, unspecified: Secondary | ICD-10-CM | POA: Insufficient documentation

## 2016-05-31 DIAGNOSIS — M549 Dorsalgia, unspecified: Secondary | ICD-10-CM | POA: Diagnosis not present

## 2016-05-31 MED ORDER — ONDANSETRON 4 MG PO TBDP
4.0000 mg | ORAL_TABLET | Freq: Once | ORAL | Status: AC
  Administered 2016-06-01: 4 mg via ORAL
  Filled 2016-05-31: qty 1

## 2016-05-31 MED ORDER — ACETAMINOPHEN-CODEINE #3 300-30 MG PO TABS
1.0000 | ORAL_TABLET | Freq: Once | ORAL | Status: AC
  Administered 2016-06-01: 1 via ORAL
  Filled 2016-05-31: qty 1

## 2016-05-31 MED ORDER — METHOCARBAMOL 500 MG PO TABS
500.0000 mg | ORAL_TABLET | Freq: Once | ORAL | Status: AC
  Administered 2016-06-01: 500 mg via ORAL
  Filled 2016-05-31: qty 1

## 2016-05-31 NOTE — ED Notes (Addendum)
Pt reports spending a lot of time on her "tablet" yesterday. Pt reports she has chronic neck and back pain and spent a lot of time yesterday staring at her tablet. Pt states she woke up this morning and had neck, shoulder, and back pain. Pt states she has tried everything but can't relieve the pain. Pt also showing me a place on her right wrist/foream where her dog scratched her right on top of a vein. Area is red.

## 2016-05-31 NOTE — ED Provider Notes (Signed)
CSN: [id number]     Arrival date & time 05/31/16  2207 History   First MD Initiated Contact with Patient 05/31/16 2335     Chief Complaint  Patient presents with  . Neck Pain     (Consider location/radiation/quality/duration/timing/severity/associated sxs/prior Treatment) Patient is a 75 y.o. female presenting with neck pain. The history is provided by the patient.  Neck Pain Pain location:  R side Quality:  Aching and shooting Pain radiates to:  R scapula (upper back.) Pain severity:  Moderate Pain is:  Same all the time Onset quality:  Gradual Duration: 1. Timing:  Intermittent Progression:  Worsening Chronicity: acute on chronic. Context comment:  Pt had her head at a bad angle for nearly 6 hours playing games on her tablet. Relieved by:  Nothing Ineffective treatments: muscle rubs. Associated symptoms: no bladder incontinence, no bowel incontinence, no fever, no numbness, no syncope, no tingling and no weakness   Risk factors: no recent head injury and no recurrent falls     Past Medical History  Diagnosis Date  . Asthma   . COPD (chronic obstructive pulmonary disease) ([place])   . S/P colonoscopy 2009    dr. Gala Romney: anal papilla, 2 cecal AVMs. Due in 2014  . Dysphagia     requiring multiple dilations in past  . S/P endoscopy 2012    Dr. Gala Romney: mild chronic gastritis, bulbar erosions, s/p 54-French Maloney dilation   . S/P endoscopy 2003, 2004, 2007    gastritis, duodenitis, s/p dilation, +H.pylori   . Complication of anesthesia     hard to wake   Past Surgical History  Procedure Laterality Date  . Appendectomy    . Inguinal hernia repair    . Tonsillectomy    . Total abdominal hysterectomy    . Cholecystectomy    . Esophagogastroduodenoscopy  04/02/2011    RMR: 1. Endoscopically normal -appearing esophagus status post passage of a Maloney dilator followed by biopsy. 2. Hiatal hernia, Mottling, and submucosal petechiae in teh gastric mucoas of uncertain significance  status post biopsy. 3. Bulbar erosions as described above.   . Colonoscopy  10/10/2008    RMR: 1. Anal papilla, otherwise normal rectum. 2. Two cecal arteriovenous malformations, colonic mucosa apperaed normal.   . Colonoscopy N/A 05/10/2015    RMR: Rectal and colonic polyps removed as described above. Ascending colon AVMs . Rectal polyps were tubular adenomas. Next colonoscopy May 2021.  Marland Kitchen Esophagogastroduodenoscopy N/A 05/10/2015    RMR: Normal esophagus status post Maloney dilation. Small hiatal hernia. Abnormal gastric because of doubtful clinical significance status post biopsy (benign)  . Esophageal dilation N/A 05/10/2015    Procedure: ESOPHAGEAL DILATION;  Surgeon: Daneil Dolin, MD;  Location: [facility];  Service: Endoscopy;  Laterality: N/A;   Family History  Problem Relation Age of Onset  . Colon cancer Neg Hx   . Heart disease Mother   . Lung cancer Father    Social History  Substance Use Topics  . Smoking status: Current Every Day Smoker -- 1.00 packs/day    Types: Cigarettes    Start date: 08/18/1959  . Smokeless tobacco: Never Used  . Alcohol Use: No   OB History    No data available     Review of Systems  Constitutional: Negative for fever.  Cardiovascular: Negative for syncope.  Gastrointestinal: Negative for bowel incontinence.  Genitourinary: Negative for bladder incontinence.  Musculoskeletal: Positive for back pain, arthralgias and neck pain.  Neurological: Negative for tingling, weakness and numbness.  All  other systems reviewed and are negative.     Allergies  Review of patient's allergies indicates no known allergies.  Home Medications   Prior to Admission medications   Medication Sig Start Date End Date Taking? Authorizing Provider  albuterol (PROVENTIL HFA;VENTOLIN HFA) 108 (90 BASE) MCG/ACT inhaler Inhale 2 puffs into the lungs every 6 (six) hours as needed for wheezing or shortness of breath. 09/04/15   [name], MD  albuterol  (PROVENTIL) (2.5 MG/3ML) 0.083% nebulizer solution Take 3 mLs (2.5 mg total) by nebulization every 4 (four) hours as needed for wheezing or shortness of breath. 09/04/15   [name], MD  Calcium Carbonate (CALCIUM 600 PO) Take 600 mg by mouth daily.    Historical Provider, MD  Fluticasone-Salmeterol (ADVAIR DISKUS) 100-50 MCG/DOSE AEPB Inhale 1 puff into the lungs 2 (two) times daily. 10/15/15   Shawn C Joy, PA-C  guaiFENesin (MUCINEX) 600 MG 12 hr tablet Take 1 tablet (600 mg total) by mouth 2 (two) times daily. 11/30/15   Lucia Gaskins, MD  Multiple Vitamin (MULTIVITAMIN WITH MINERALS) TABS tablet Take 1 tablet by mouth daily.    Historical Provider, MD  predniSONE (DELTASONE) 50 MG tablet Take 1 tablet (50 mg total) by mouth daily with breakfast. 11/30/15   Lucia Gaskins, MD  tiotropium (SPIRIVA) 18 MCG inhalation capsule Place 1 capsule (18 mcg total) into inhaler and inhale daily. 11/30/15   Lucia Gaskins, MD   BP 128/68 mmHg  Pulse 91  Temp(Src) 98.9 F (37.2 C) (Oral)  Resp 16  Ht 5\' 4"  (1.626 m)  Wt 43.262 kg  BMI 16.36 kg/m2  SpO2 96% Physical Exam  Constitutional: She is oriented to person, place, and time. She appears well-developed and well-nourished.  Non-toxic appearance.  HENT:  Head: Normocephalic.  Right Ear: Tympanic membrane and external ear normal.  Left Ear: Tympanic membrane and external ear normal.  Eyes: EOM and lids are normal. Pupils are equal, round, and reactive to light.  Neck: Normal range of motion. Neck supple. Carotid bruit is not present.  Cardiovascular: Normal rate, regular rhythm, normal heart sounds, intact distal pulses and normal pulses.   Pulmonary/Chest: Breath sounds normal. No respiratory distress.  Abdominal: Soft. Bowel sounds are normal. There is no tenderness. There is no guarding.  Musculoskeletal: Normal range of motion.  Lymphadenopathy:       Head (right side): No submandibular adenopathy present.       Head (left side): No  submandibular adenopathy present.    She has no cervical adenopathy.  Neurological: She is alert and oriented to person, place, and time. She has normal strength. No cranial nerve deficit or sensory deficit.  Skin: Skin is warm and dry.  Psychiatric: She has a normal mood and affect. Her speech is normal.  Nursing note and vitals reviewed.   ED Course  Procedures (including critical care time) Labs Review Labs Reviewed - No data to display  Imaging Review No results found. I have personally reviewed and evaluated these images and lab results as part of my medical decision-making.   EKG Interpretation None      MDM  Vital signs are well within normal limits. The pain of the neck shoulders and back and be reproduced with attempted range of motion. There's no history of any jaw pain, chest pain, shortness of breath, loss of consciousness, or sweats. Doubt a cardiac related issue. The patient states that she had her neck in an awkward position for 5 or 6 hours while playing a  game on her tablet. The examination favors a trapezius strain. The patient states she has had success in using Tylenol codeine in the past, but states she will probably do more with Tylenol and heating pad than anything else. The patient is to attempt the heating pad and the Tylenol extra strength. She is to see her primary physician or return to the emergency department if not improving. Prescription for Tylenol codeine has been given to the patient with caution to use this medication on with care as it may cause drowsiness and/or lightheadedness. Patient acknowledges understanding of these discharge instructions.    Final diagnoses:  Trapezius strain, right, initial encounter    *I have reviewed nursing notes, vital signs, and all appropriate lab and imaging results for this patient.    Lily Kocher, PA-C 06/01/16 0006  [name], MD 06/01/16 [phone]

## 2016-06-01 DIAGNOSIS — S46811A Strain of other muscles, fascia and tendons at shoulder and upper arm level, right arm, initial encounter: Secondary | ICD-10-CM | POA: Diagnosis not present

## 2016-06-01 MED ORDER — ACETAMINOPHEN-CODEINE #3 300-30 MG PO TABS: 1.0000 | ORAL_TABLET | Freq: Four times a day (QID) | ORAL | Status: DC | PRN

## 2016-06-01 NOTE — Discharge Instructions (Signed)
Muscle Strain °A muscle strain (pulled muscle) happens when a muscle is stretched beyond normal length. It happens when a sudden, violent force stretches your muscle too far. Usually, a few of the fibers in your muscle are torn. Muscle strain is common in athletes. Recovery usually takes 1-2 weeks. Complete healing takes 5-6 weeks.  °HOME CARE  °· Follow the PRICE method of treatment to help your injury get better. Do this the first 2-3 days after the injury: °¨ Protect. Protect the muscle to keep it from getting injured again. °¨ Rest. Limit your activity and rest the injured body part. °¨ Ice. Put ice in a plastic bag. Place a towel between your skin and the bag. Then, apply the ice and leave it on from 15-20 minutes each hour. After the third day, switch to moist heat packs. °¨ Compression. Use a splint or elastic bandage on the injured area for comfort. Do not put it on too tightly. °¨ Elevate. Keep the injured body part above the level of your heart. °· Only take medicine as told by your doctor. °· Warm up before doing exercise to prevent future muscle strains. °GET HELP IF:  °· You have more pain or puffiness (swelling) in the injured area. °· You feel numbness, tingling, or notice a loss of strength in the injured area. °MAKE SURE YOU:  °· Understand these instructions. °· Will watch your condition. °· Will get help right away if you are not doing well or get worse. °  °This information is not intended to replace advice given to you by your health care provider. Make sure you discuss any questions you have with your health care provider. °  °Document Released: 09/17/2008 Document Revised: 09/29/2013 Document Reviewed: 07/08/2013 °Elsevier Interactive Patient Education ©2016 [employer] ° °

## 2016-07-04 DIAGNOSIS — F17208 Nicotine dependence, unspecified, with other nicotine-induced disorders: Secondary | ICD-10-CM | POA: Diagnosis not present

## 2016-07-04 DIAGNOSIS — J449 Chronic obstructive pulmonary disease, unspecified: Secondary | ICD-10-CM | POA: Diagnosis not present

## 2016-08-06 DIAGNOSIS — Z23 Encounter for immunization: Secondary | ICD-10-CM | POA: Diagnosis not present

## 2016-08-13 DIAGNOSIS — F17200 Nicotine dependence, unspecified, uncomplicated: Secondary | ICD-10-CM | POA: Diagnosis not present

## 2016-08-13 DIAGNOSIS — E46 Unspecified protein-calorie malnutrition: Secondary | ICD-10-CM | POA: Diagnosis not present

## 2016-08-13 DIAGNOSIS — J449 Chronic obstructive pulmonary disease, unspecified: Secondary | ICD-10-CM | POA: Diagnosis not present

## 2016-10-18 ENCOUNTER — Ambulatory Visit (HOSPITAL_COMMUNITY)
Admission: RE | Admit: 2016-10-18 | Discharge: 2016-10-18 | Disposition: A | Discharge status: Home / Self Care | Payer: Medicare Other | Source: Ambulatory Visit | Attending: Pulmonary Disease | Admitting: Pulmonary Disease

## 2016-10-18 ENCOUNTER — Other Ambulatory Visit (HOSPITAL_COMMUNITY): Payer: Self-pay | Admitting: Pulmonary Disease

## 2016-10-18 DIAGNOSIS — M25552 Pain in left hip: Secondary | ICD-10-CM | POA: Insufficient documentation

## 2016-10-18 DIAGNOSIS — M79605 Pain in left leg: Secondary | ICD-10-CM | POA: Diagnosis not present

## 2016-10-18 DIAGNOSIS — M25562 Pain in left knee: Secondary | ICD-10-CM | POA: Diagnosis not present

## 2016-10-18 DIAGNOSIS — M1612 Unilateral primary osteoarthritis, left hip: Secondary | ICD-10-CM | POA: Diagnosis not present

## 2016-10-18 DIAGNOSIS — R52 Pain, unspecified: Secondary | ICD-10-CM

## 2016-10-19 ENCOUNTER — Emergency Department (HOSPITAL_COMMUNITY): Payer: Medicare Other

## 2016-10-19 ENCOUNTER — Emergency Department (HOSPITAL_COMMUNITY)
Admission: EM | Admit: 2016-10-19 | Discharge: 2016-10-19 | Disposition: A | Discharge status: Home / Self Care | Payer: Medicare Other | Attending: Emergency Medicine | Admitting: Emergency Medicine

## 2016-10-19 ENCOUNTER — Encounter (HOSPITAL_COMMUNITY): Payer: Self-pay | Admitting: Emergency Medicine

## 2016-10-19 DIAGNOSIS — J449 Chronic obstructive pulmonary disease, unspecified: Secondary | ICD-10-CM | POA: Insufficient documentation

## 2016-10-19 DIAGNOSIS — Z79899 Other long term (current) drug therapy: Secondary | ICD-10-CM | POA: Insufficient documentation

## 2016-10-19 DIAGNOSIS — F17210 Nicotine dependence, cigarettes, uncomplicated: Secondary | ICD-10-CM | POA: Diagnosis not present

## 2016-10-19 DIAGNOSIS — R079 Chest pain, unspecified: Secondary | ICD-10-CM | POA: Diagnosis not present

## 2016-10-19 DIAGNOSIS — J45909 Unspecified asthma, uncomplicated: Secondary | ICD-10-CM | POA: Diagnosis not present

## 2016-10-19 HISTORY — DX: Cardiac arrhythmia, unspecified: I49.9

## 2016-10-19 LAB — BASIC METABOLIC PANEL
Anion gap: 6 (ref 5–15)
BUN: 17 mg/dL (ref 6–20)
CO2: 28 mmol/L (ref 22–32)
Calcium: 9.3 mg/dL (ref 8.9–10.3)
Chloride: 108 mmol/L (ref 101–111)
Creatinine, Ser: 0.93 mg/dL (ref 0.44–1.00)
GFR calc Af Amer: >60 mL/min (ref >60)
GFR calc non Af Amer: 60 mL/min — ABNORMAL LOW (ref >60)
Glucose, Bld: 91 mg/dL (ref 65–99)
Potassium: 3.2 mmol/L — ABNORMAL LOW (ref 3.5–5.1)
Sodium: 142 mmol/L (ref 135–145)

## 2016-10-19 LAB — CBC
HCT: 35.3 % — ABNORMAL LOW (ref 36.0–46.0)
Hemoglobin: 11.5 g/dL — ABNORMAL LOW (ref 12.0–15.0)
MCH: 28.8 pg (ref 26.0–34.0)
MCHC: 32.6 g/dL (ref 30.0–36.0)
MCV: 88.3 fL (ref 78.0–100.0)
Platelets: 294 10*3/uL (ref 150–400)
RBC: 4 MIL/uL (ref 3.87–5.11)
RDW: 14.2 % (ref 11.5–15.5)
WBC: 8.5 10*3/uL (ref 4.0–10.5)

## 2016-10-19 LAB — I-STAT TROPONIN, ED: Troponin i, poc: 0 ng/mL (ref 0.00–0.08)

## 2016-10-19 MED ORDER — ONDANSETRON HCL 4 MG/2ML IJ SOLN
4.0000 mg | Freq: Once | INTRAMUSCULAR | Status: AC
  Administered 2016-10-19: 4 mg via INTRAVENOUS
  Filled 2016-10-19: qty 2

## 2016-10-19 MED ORDER — IOPAMIDOL (ISOVUE-370) INJECTION 76%
100.0000 mL | Freq: Once | INTRAVENOUS | Status: AC | PRN
  Administered 2016-10-19: 100 mL via INTRAVENOUS

## 2016-10-19 MED ORDER — KETOROLAC TROMETHAMINE 15 MG/ML IJ SOLN
15.0000 mg | Freq: Once | INTRAMUSCULAR | Status: DC
  Filled 2016-10-19: qty 1

## 2016-10-19 MED ORDER — KETOROLAC TROMETHAMINE 30 MG/ML IJ SOLN
INTRAMUSCULAR | Status: AC
  Administered 2016-10-19: 15 mg
  Filled 2016-10-19: qty 1

## 2016-10-19 MED ORDER — SODIUM CHLORIDE 0.9 % IV BOLUS (SEPSIS)
500.0000 mL | Freq: Once | INTRAVENOUS | Status: AC
  Administered 2016-10-19: 500 mL via INTRAVENOUS

## 2016-10-19 MED ORDER — MORPHINE SULFATE (PF) 4 MG/ML IV SOLN
4.0000 mg | Freq: Once | INTRAVENOUS | Status: AC
  Administered 2016-10-19: 4 mg via INTRAVENOUS
  Filled 2016-10-19: qty 1

## 2016-10-19 NOTE — ED Triage Notes (Signed)
Patient c/o mid-left side chest pain that radiates into left arm. Denies any shortness of breath, nausea, vomiting, or dizziness. Per patient irregular heart beat but unsure what it's called. Patient states chest pain started this morning while making cookies. Patient states pain in back feel separate than pain in chest and is worse with deep breath or movement. Occasional cough per patient.

## 2016-10-28 NOTE — ED Provider Notes (Signed)
[facility] DEPT Provider Note   CSN: [id number] Arrival date & time: 10/19/16  1547     History   Chief Complaint Chief Complaint  Patient presents with  . Chest Pain    HPI Katelyn [name] is a 75 y.o. female.  HPI  75 year old female with chest pain. She reports that she chronically has chest pain. She normally attributes this to her COPD. Last A MINUTE TYPICALLY DOES THOUGH TODAY WHICH IS WHY SHE IS IN FOR EVALUATION. PAIN IS WORSE WITH CERTAIN MOVEMENTS AND WITH DEEP BREATHS. OCCASIONAL COUGH. No change in her dyspnea. No nausea or vomiting. No dizziness or lightheadedness.  Past Medical History:  Diagnosis Date  . Asthma   . Complication of anesthesia    hard to wake  . COPD (chronic obstructive pulmonary disease) ([place])   . Dysphagia    requiring multiple dilations in past  . Irregular heart beat   . S/P colonoscopy 2009   dr. Gala Romney: anal papilla, 2 cecal AVMs. Due in 2014  . S/P endoscopy 2012   Dr. Gala Romney: mild chronic gastritis, bulbar erosions, s/p 54-French Maloney dilation   . S/P endoscopy 2003, 2004, 2007   gastritis, duodenitis, s/p dilation, +H.pylori     Patient Active Problem List   Diagnosis Date Noted  . Hypoxia 11/28/2015  . COPD exacerbation ([place]) 11/28/2015  . Acute respiratory failure with hypoxia ([place]) 11/28/2015  . SOB (shortness of breath) 11/28/2015  . Mucosal abnormality of stomach   . History of colonic polyps 04/25/2015  . 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 Esophageal dysphagia 03/11/2011    Past Surgical History:  Procedure Laterality Date  . APPENDECTOMY    . CHOLECYSTECTOMY    . COLONOSCOPY  10/10/2008   RMR: 1. Anal papilla, otherwise normal rectum. 2. Two cecal arteriovenous malformations, colonic mucosa apperaed normal.   . COLONOSCOPY N/A 05/10/2015   RMR: Rectal and colonic polyps removed as described above. Ascending colon AVMs . Rectal polyps were tubular adenomas. Next colonoscopy May 2021.  . ESOPHAGEAL DILATION N/A  05/10/2015   Procedure: ESOPHAGEAL DILATION;  Surgeon: Daneil Dolin, MD;  Location: [facility];  Service: Endoscopy;  Laterality: N/A;  . ESOPHAGOGASTRODUODENOSCOPY  04/02/2011   RMR: 1. Endoscopically normal -appearing esophagus status post passage of a Maloney dilator followed by biopsy. 2. Hiatal hernia, Mottling, and submucosal petechiae in teh gastric mucoas of uncertain significance status post biopsy. 3. Bulbar erosions as described above.   . ESOPHAGOGASTRODUODENOSCOPY N/A 05/10/2015   RMR: Normal esophagus status post Maloney dilation. Small hiatal hernia. Abnormal gastric because of doubtful clinical significance status post biopsy (benign)  . INGUINAL HERNIA REPAIR    . TONSILLECTOMY    . TOTAL ABDOMINAL HYSTERECTOMY      OB History    Gravida Para Term Preterm AB Living   5 5 4 1   4    SAB TAB Ectopic Multiple Live Births                   Home Medications    Prior to Admission medications   Medication Sig Start Date End Date Taking? Authorizing Provider  albuterol (PROVENTIL HFA;VENTOLIN HFA) 108 (90 BASE) MCG/ACT inhaler Inhale 2 puffs into the lungs every 6 (six) hours as needed for wheezing or shortness of breath. 09/04/15  Yes [name], MD  albuterol (PROVENTIL) (2.5 MG/3ML) 0.083% nebulizer solution Take 3 mLs (2.5 mg total) by nebulization every 4 (four) hours as needed for wheezing or shortness of breath. 09/04/15  Yes  [name], MD  dextromethorphan-guaiFENesin [facility] DM) 30-600 MG 12hr tablet Take 1 tablet by mouth daily as needed for cough.   Yes Historical Provider, MD  fluticasone furoate-vilanterol (BREO ELLIPTA) 100-25 MCG/INH AEPB Inhale 1 puff into the lungs daily.   Yes Historical Provider, MD    Family History Family History  Problem Relation Age of Onset  . Heart disease Mother   . Lung cancer Father   . Colon cancer Neg Hx     Social History Social History  Substance Use Topics  . Smoking status: Current Every Day Smoker     Packs/day: 1.00    Years: 57.00    Types: Cigarettes    Start date: 08/18/1959  . Smokeless tobacco: Never Used  . Alcohol use No     Allergies   Patient has no known allergies.   Review of Systems Review of Systems  All systems reviewed and negative, other than as noted in HPI.   Physical Exam Updated Vital Signs BP 110/68   Pulse (!) 58   Temp 97.8 F (36.6 C) (Oral)   Resp 16   Ht 5\' 3"  (1.6 m)   Wt 90 lb (40.8 kg)   SpO2 96%   BMI 15.94 kg/m   Physical Exam  Constitutional: She appears well-developed and well-nourished. No distress.  HENT:  Head: Normocephalic and atraumatic.  Eyes: Conjunctivae are normal. Right eye exhibits no discharge. Left eye exhibits no discharge.  Neck: Neck supple.  Cardiovascular: Normal rate, regular rhythm and normal heart sounds.  Exam reveals no gallop and no friction rub.   No murmur heard. Pulmonary/Chest: Effort normal and breath sounds normal. No respiratory distress.  Abdominal: Soft. She exhibits no distension. There is no tenderness.  Musculoskeletal: She exhibits no edema or tenderness.  Lower extremities symmetric as compared to each other. No calf tenderness. Negative Homan's. No palpable cords.   Neurological: She is alert.  Skin: Skin is warm and dry.  Psychiatric: She has a normal mood and affect. Her behavior is normal. Thought content normal.  Nursing note and vitals reviewed.    ED Treatments / Results  Labs (all labs ordered are listed, but only abnormal results are displayed) Labs Reviewed  BASIC METABOLIC PANEL - Abnormal; Notable for the following:       Result Value   Potassium 3.2 (*)    GFR calc non Af Amer 60 (*)    All other components within normal limits  CBC - Abnormal; Notable for the following:    Hemoglobin 11.5 (*)    HCT 35.3 (*)    All other components within normal limits  I-STAT TROPOININ, ED    EKG  EKG Interpretation  Date/Time:  Saturday October 19 2016 15:59:36  EDT Ventricular Rate:  75 PR Interval:    QRS Duration: 108 QT Interval:  413 QTC Calculation: 462 R Axis:   -69 Text Interpretation:  Sinus rhythm Consider left atrial enlargement LAD, consider left anterior fascicular block RSR' in V1 or V2, right VCD or RVH No significant change since last tracing Confirmed by Winfred Leeds  MD, SAM [phone]) on 10/20/2016 3:00:35 PM       Radiology No results found.  Procedures Procedures (including critical care time)  Medications Ordered in ED Medications  ondansetron (ZOFRAN) injection 4 mg (4 mg Intravenous Given 10/19/16 1741)  sodium chloride 0.9 % bolus 500 mL (0 mLs Intravenous Stopped 10/19/16 1923)  morphine 4 MG/ML injection 4 mg (4 mg Intravenous Given 10/19/16 1741)  ketorolac (  TORADOL) 30 MG/ML injection (15 mg  Given 10/19/16 1741)  iopamidol (ISOVUE-370) 76 % injection 100 mL (100 mLs Intravenous Contrast Given 10/19/16 1810)     Initial Impression / Assessment and Plan / ED Course  I have reviewed the triage vital signs and the nursing notes.  Pertinent labs & imaging results that were available during my care of the patient were reviewed by me and considered in my medical decision making (see chart for details).  Clinical Course       Final Clinical Impressions(s) / ED Diagnoses   Final diagnoses:  Chest pain, unspecified type    New Prescriptions Discharge Medication List as of 10/19/2016  7:13 PM       Virgel Manifold, MD 10/28/16 1150

## 2016-11-12 DIAGNOSIS — R911 Solitary pulmonary nodule: Secondary | ICD-10-CM | POA: Diagnosis not present

## 2016-11-12 DIAGNOSIS — F17200 Nicotine dependence, unspecified, uncomplicated: Secondary | ICD-10-CM | POA: Diagnosis not present

## 2016-11-12 DIAGNOSIS — E46 Unspecified protein-calorie malnutrition: Secondary | ICD-10-CM | POA: Diagnosis not present

## 2016-11-12 DIAGNOSIS — J449 Chronic obstructive pulmonary disease, unspecified: Secondary | ICD-10-CM | POA: Diagnosis not present

## 2017-02-12 DIAGNOSIS — E46 Unspecified protein-calorie malnutrition: Secondary | ICD-10-CM | POA: Diagnosis not present

## 2017-02-12 DIAGNOSIS — J449 Chronic obstructive pulmonary disease, unspecified: Secondary | ICD-10-CM | POA: Diagnosis not present

## 2017-02-12 DIAGNOSIS — R911 Solitary pulmonary nodule: Secondary | ICD-10-CM | POA: Diagnosis not present

## 2017-02-12 DIAGNOSIS — F17200 Nicotine dependence, unspecified, uncomplicated: Secondary | ICD-10-CM | POA: Diagnosis not present

## 2017-05-13 DIAGNOSIS — E46 Unspecified protein-calorie malnutrition: Secondary | ICD-10-CM | POA: Diagnosis not present

## 2017-05-13 DIAGNOSIS — J449 Chronic obstructive pulmonary disease, unspecified: Secondary | ICD-10-CM | POA: Diagnosis not present

## 2017-05-13 DIAGNOSIS — R634 Abnormal weight loss: Secondary | ICD-10-CM | POA: Diagnosis not present

## 2017-05-13 DIAGNOSIS — F17200 Nicotine dependence, unspecified, uncomplicated: Secondary | ICD-10-CM | POA: Diagnosis not present

## 2017-05-20 DIAGNOSIS — F419 Anxiety disorder, unspecified: Secondary | ICD-10-CM | POA: Diagnosis not present

## 2017-05-20 DIAGNOSIS — F17200 Nicotine dependence, unspecified, uncomplicated: Secondary | ICD-10-CM | POA: Diagnosis not present

## 2017-05-20 DIAGNOSIS — R634 Abnormal weight loss: Secondary | ICD-10-CM | POA: Diagnosis not present

## 2017-05-20 DIAGNOSIS — E46 Unspecified protein-calorie malnutrition: Secondary | ICD-10-CM | POA: Diagnosis not present

## 2017-05-20 DIAGNOSIS — J449 Chronic obstructive pulmonary disease, unspecified: Secondary | ICD-10-CM | POA: Diagnosis not present

## 2017-06-09 ENCOUNTER — Emergency Department (HOSPITAL_COMMUNITY)
Admission: EM | Admit: 2017-06-09 | Discharge: 2017-06-09 | Disposition: A | Discharge status: Home / Self Care | Payer: Medicare Other | Attending: Emergency Medicine | Admitting: Emergency Medicine

## 2017-06-09 ENCOUNTER — Encounter (HOSPITAL_COMMUNITY): Payer: Self-pay

## 2017-06-09 DIAGNOSIS — J45909 Unspecified asthma, uncomplicated: Secondary | ICD-10-CM | POA: Insufficient documentation

## 2017-06-09 DIAGNOSIS — Y999 Unspecified external cause status: Secondary | ICD-10-CM | POA: Insufficient documentation

## 2017-06-09 DIAGNOSIS — S40021A Contusion of right upper arm, initial encounter: Secondary | ICD-10-CM | POA: Insufficient documentation

## 2017-06-09 DIAGNOSIS — J441 Chronic obstructive pulmonary disease with (acute) exacerbation: Secondary | ICD-10-CM | POA: Insufficient documentation

## 2017-06-09 DIAGNOSIS — Z79899 Other long term (current) drug therapy: Secondary | ICD-10-CM | POA: Diagnosis not present

## 2017-06-09 DIAGNOSIS — R05 Cough: Secondary | ICD-10-CM | POA: Diagnosis not present

## 2017-06-09 DIAGNOSIS — F17210 Nicotine dependence, cigarettes, uncomplicated: Secondary | ICD-10-CM | POA: Insufficient documentation

## 2017-06-09 DIAGNOSIS — Y92009 Unspecified place in unspecified non-institutional (private) residence as the place of occurrence of the external cause: Secondary | ICD-10-CM | POA: Insufficient documentation

## 2017-06-09 DIAGNOSIS — Y33XXXA Other specified events, undetermined intent, initial encounter: Secondary | ICD-10-CM | POA: Diagnosis not present

## 2017-06-09 DIAGNOSIS — Y93K1 Activity, walking an animal: Secondary | ICD-10-CM | POA: Diagnosis not present

## 2017-06-09 DIAGNOSIS — M79621 Pain in right upper arm: Secondary | ICD-10-CM | POA: Diagnosis present

## 2017-06-09 NOTE — Discharge Instructions (Signed)
Your examination is consistent with a bruise or contusion, and possibly a strain of the muscles in your upper arm. Please use Capzasin HP rub to your arm 2 or 3 times daily. Wash hands after each application. Use 1000mg  of tylenol with breakfast, lunch and dinner over the next 5 days, then every 4 hours as needed. Please see Dr. Luan Pulling for additional evaluation if not improving.

## 2017-06-09 NOTE — ED Provider Notes (Signed)
[facility] DEPT Provider Note   CSN: [id number] Arrival date & time: 06/09/17  2505     History   Chief Complaint Chief Complaint  Patient presents with  . Arm Pain    HPI Katelyn [name] is a 76 y.o. female.  Patient is a 76 year old female who presents to the emergency department with complaint of right upper arm pain.  The patient states that about 3 or 4 days ago she was walking into the home with a pitbull puppy. The dog pulled her into the wall in door frame. The patient states that she had some soreness of her upper arm and shoulder on, but when the pain continued for 3 days afterwards she became concerned and came to the emergency department. She notices that the pain is worse with certain movements. It is not a constant type pain. She has not had any excessive swelling. She can use the arm, but states that driving or lifting certain ways causes discomfort of the arm. Patient is not had any previous operations or procedures involving her arm.      Past Medical History:  Diagnosis Date  . Asthma   . Complication of anesthesia    hard to wake  . COPD (chronic obstructive pulmonary disease) ([place])   . Dysphagia    requiring multiple dilations in past  . Irregular heart beat   . S/P colonoscopy 2009   dr. Gala Romney: anal papilla, 2 cecal AVMs. Due in 2014  . S/P endoscopy 2012   Dr. Gala Romney: mild chronic gastritis, bulbar erosions, s/p 54-French Maloney dilation   . S/P endoscopy 2003, 2004, 2007   gastritis, duodenitis, s/p dilation, +H.pylori     Patient Active Problem List   Diagnosis Date Noted  . Hypoxia 11/28/2015  . COPD exacerbation ([place]) 11/28/2015  . Acute respiratory failure with hypoxia ([place]) 11/28/2015  . SOB (shortness of breath) 11/28/2015  . Mucosal abnormality of stomach   . History of colonic polyps 04/25/2015  . 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 Esophageal dysphagia 03/11/2011    Past Surgical History:  Procedure Laterality Date  .  APPENDECTOMY    . CHOLECYSTECTOMY    . COLONOSCOPY  10/10/2008   RMR: 1. Anal papilla, otherwise normal rectum. 2. Two cecal arteriovenous malformations, colonic mucosa apperaed normal.   . COLONOSCOPY N/A 05/10/2015   RMR: Rectal and colonic polyps removed as described above. Ascending colon AVMs . Rectal polyps were tubular adenomas. Next colonoscopy May 2021.  . ESOPHAGEAL DILATION N/A 05/10/2015   Procedure: ESOPHAGEAL DILATION;  Surgeon: Daneil Dolin, MD;  Location: [facility];  Service: Endoscopy;  Laterality: N/A;  . ESOPHAGOGASTRODUODENOSCOPY  04/02/2011   RMR: 1. Endoscopically normal -appearing esophagus status post passage of a Maloney dilator followed by biopsy. 2. Hiatal hernia, Mottling, and submucosal petechiae in teh gastric mucoas of uncertain significance status post biopsy. 3. Bulbar erosions as described above.   . ESOPHAGOGASTRODUODENOSCOPY N/A 05/10/2015   RMR: Normal esophagus status post Maloney dilation. Small hiatal hernia. Abnormal gastric because of doubtful clinical significance status post biopsy (benign)  . INGUINAL HERNIA REPAIR    . TONSILLECTOMY    . TOTAL ABDOMINAL HYSTERECTOMY      OB History    Gravida Para Term Preterm AB Living   5 5 4 1   4    SAB TAB Ectopic Multiple Live Births                   Home Medications    Prior to  Admission medications   Medication Sig Start Date End Date Taking? Authorizing Provider  albuterol (PROVENTIL HFA;VENTOLIN HFA) 108 (90 BASE) MCG/ACT inhaler Inhale 2 puffs into the lungs every 6 (six) hours as needed for wheezing or shortness of breath. 09/04/15   [name], MD  albuterol (PROVENTIL) (2.5 MG/3ML) 0.083% nebulizer solution Take 3 mLs (2.5 mg total) by nebulization every 4 (four) hours as needed for wheezing or shortness of breath. 09/04/15   [name], MD  dextromethorphan-guaiFENesin [facility] DM) 30-600 MG 12hr tablet Take 1 tablet by mouth daily as needed for cough.    [provider]    fluticasone furoate-vilanterol (BREO ELLIPTA) 100-25 MCG/INH AEPB Inhale 1 puff into the lungs daily.    [provider]    Family History Family History  Problem Relation Age of Onset  . Heart disease Mother   . Lung cancer Father   . Colon cancer Neg Hx     Social History Social History  Substance Use Topics  . Smoking status: Current Every Day Smoker    Packs/day: 1.00    Years: 57.00    Types: Cigarettes    Start date: 08/18/1959  . Smokeless tobacco: Never Used  . Alcohol use No     Allergies   Patient has no known allergies.   Review of Systems Review of Systems  Constitutional: Negative for activity change and appetite change.  HENT: Negative for congestion, ear discharge, ear pain, facial swelling, nosebleeds, rhinorrhea, sneezing and tinnitus.   Eyes: Negative for photophobia, pain and discharge.  Respiratory: Positive for cough and shortness of breath. Negative for choking and wheezing.   Cardiovascular: Negative for chest pain, palpitations and leg swelling.  Gastrointestinal: Negative for abdominal pain, blood in stool, constipation, diarrhea, nausea and vomiting.  Genitourinary: Negative for difficulty urinating, dysuria, flank pain, frequency and hematuria.  Musculoskeletal: Positive for arthralgias. Negative for back pain, gait problem, myalgias and neck pain.  Skin: Negative for color change, rash and wound.  Neurological: Negative for dizziness, seizures, syncope, facial asymmetry, speech difficulty, weakness and numbness.  Hematological: Negative for adenopathy. Does not bruise/bleed easily.  Psychiatric/Behavioral: Negative for agitation, confusion, hallucinations, self-injury and suicidal ideas. The patient is not nervous/anxious.      Physical Exam Updated Vital Signs BP 133/71 (BP Location: Left Arm)   Pulse 66   Temp 97.2 F (36.2 C) (Oral)   Resp 18   Ht 5\' 4"  (1.626 m)   Wt 42.2 kg (93 lb)   SpO2 99%   BMI 15.96 kg/m    Physical Exam  Constitutional: Vital signs are normal. She appears well-developed and well-nourished. She is active.  HENT:  Head: Normocephalic and atraumatic.  Right Ear: Tympanic membrane, external ear and ear canal normal.  Left Ear: Tympanic membrane, external ear and ear canal normal.  Nose: Nose normal.  Mouth/Throat: Uvula is midline, oropharynx is clear and moist and mucous membranes are normal.  Eyes: Conjunctivae, EOM and lids are normal. Pupils are equal, round, and reactive to light.  Neck: Trachea normal, normal range of motion and phonation normal. Neck supple. Carotid bruit is not present.  Cardiovascular: Normal rate, regular rhythm and normal pulses.   Abdominal: Soft. Normal appearance and bowel sounds are normal.  Musculoskeletal:       Left shoulder: Normal.       Left elbow: Normal.       Left wrist: Normal.       Right upper arm: She exhibits tenderness. She exhibits no swelling  and no deformity.       Arms:      Right hand: Normal. She exhibits normal capillary refill. Normal sensation noted. Normal strength noted.  Lymphadenopathy:       Head (right side): No submental, no preauricular and no posterior auricular adenopathy present.       Head (left side): No submental, no preauricular and no posterior auricular adenopathy present.    She has no cervical adenopathy.  Neurological: She is alert. She has normal strength. No cranial nerve deficit or sensory deficit. GCS eye subscore is 4. GCS verbal subscore is 5. GCS motor subscore is 6.  Skin: Skin is warm and dry.  Psychiatric: Her speech is normal.     ED Treatments / Results  Labs (all labs ordered are listed, but only abnormal results are displayed) Labs Reviewed - No data to display  EKG  EKG Interpretation None       Radiology No results found.  Procedures Procedures (including critical care time)  Medications Ordered in ED Medications - No data to display   Initial Impression /  Assessment and Plan / ED Course Pt seen with me by Dr Lacinda Axon.  I have reviewed the triage vital signs and the nursing notes.  Pertinent labs & imaging results that were available during my care of the patient were reviewed by me and considered in my medical decision making (see chart for details).       Final Clinical Impressions(s) / ED Diagnoses MDM Vital signs within normal limits. There is good range of motion of the right shoulder, elbow, and fingers. I suspect that the patient has a bruise to the upper arm. There is also possibility of some mild to moderate muscle strain. The patient will use Capzan HP rub to the arm to 3 times daily. Patient will also use Tylenol Extra Strength with meals. Patient will follow-up with Dr. Luan Pulling for additional evaluation if not improving. Patient is in agreement with this plan.    Final diagnoses:  Contusion of right upper arm, initial encounter    New Prescriptions New Prescriptions   No medications on file     Lily Kocher, Hershal Coria 06/09/17 Kenedy, Clare, PA-C 06/09/17 [id number]    Nat Christen, MD 06/11/17 1357

## 2017-06-09 NOTE — ED Triage Notes (Signed)
Pt reports her dog accidentally knocked her into the wall yesterday.  C/o pain to r shoulder and upper arm.

## 2017-08-01 DIAGNOSIS — E46 Unspecified protein-calorie malnutrition: Secondary | ICD-10-CM | POA: Diagnosis not present

## 2017-08-01 DIAGNOSIS — J449 Chronic obstructive pulmonary disease, unspecified: Secondary | ICD-10-CM | POA: Diagnosis not present

## 2017-08-01 DIAGNOSIS — R1031 Right lower quadrant pain: Secondary | ICD-10-CM | POA: Diagnosis not present

## 2017-11-04 DIAGNOSIS — Z23 Encounter for immunization: Secondary | ICD-10-CM | POA: Diagnosis not present

## 2017-11-04 DIAGNOSIS — R634 Abnormal weight loss: Secondary | ICD-10-CM | POA: Diagnosis not present

## 2017-11-04 DIAGNOSIS — E46 Unspecified protein-calorie malnutrition: Secondary | ICD-10-CM | POA: Diagnosis not present

## 2017-11-04 DIAGNOSIS — J449 Chronic obstructive pulmonary disease, unspecified: Secondary | ICD-10-CM | POA: Diagnosis not present

## 2017-11-04 DIAGNOSIS — M542 Cervicalgia: Secondary | ICD-10-CM | POA: Diagnosis not present

## 2017-12-01 ENCOUNTER — Encounter (HOSPITAL_COMMUNITY): Payer: Self-pay | Admitting: Cardiology

## 2017-12-01 ENCOUNTER — Emergency Department (HOSPITAL_COMMUNITY)
Admission: EM | Admit: 2017-12-01 | Discharge: 2017-12-01 | Disposition: A | Discharge status: Home / Self Care | Payer: Medicare Other | Attending: Emergency Medicine | Admitting: Emergency Medicine

## 2017-12-01 DIAGNOSIS — M79602 Pain in left arm: Secondary | ICD-10-CM | POA: Diagnosis not present

## 2017-12-01 DIAGNOSIS — J45909 Unspecified asthma, uncomplicated: Secondary | ICD-10-CM | POA: Insufficient documentation

## 2017-12-01 DIAGNOSIS — R2231 Localized swelling, mass and lump, right upper limb: Secondary | ICD-10-CM | POA: Diagnosis present

## 2017-12-01 DIAGNOSIS — J449 Chronic obstructive pulmonary disease, unspecified: Secondary | ICD-10-CM | POA: Diagnosis not present

## 2017-12-01 DIAGNOSIS — M79622 Pain in left upper arm: Secondary | ICD-10-CM | POA: Insufficient documentation

## 2017-12-01 DIAGNOSIS — Z79899 Other long term (current) drug therapy: Secondary | ICD-10-CM | POA: Insufficient documentation

## 2017-12-01 DIAGNOSIS — F17210 Nicotine dependence, cigarettes, uncomplicated: Secondary | ICD-10-CM | POA: Insufficient documentation

## 2017-12-01 MED ORDER — TRAMADOL HCL 50 MG PO TABS: 50.0000 mg | ORAL_TABLET | Freq: Four times a day (QID) | ORAL | 0 refills | Status: DC | PRN

## 2017-12-01 MED ORDER — DEXAMETHASONE 4 MG PO TABS: 4.0000 mg | ORAL_TABLET | Freq: Two times a day (BID) | ORAL | 0 refills | Status: DC

## 2017-12-01 MED ORDER — PREDNISONE 20 MG PO TABS
40.0000 mg | ORAL_TABLET | Freq: Once | ORAL | Status: AC
  Administered 2017-12-01: 40 mg via ORAL
  Filled 2017-12-01: qty 2

## 2017-12-01 MED ORDER — ACETAMINOPHEN 500 MG PO TABS
1000.0000 mg | ORAL_TABLET | Freq: Once | ORAL | Status: AC
  Administered 2017-12-01: 1000 mg via ORAL
  Filled 2017-12-01: qty 2

## 2017-12-01 NOTE — Discharge Instructions (Signed)
Your vital signs within normal limits.  Your examination suggest strain/sprain of your arm accompanied by inflammation pain.  Please use Decadron 2 times daily with food.  Please use Tylenol every 4 hours for mild pain.  Use Ultram for more severe pain.  Ultram may cause drowsiness, please do not drive, drink alcohol, or participate in activities requiring concentration when taking this medication. Use your sling to improve pain and discomfort.

## 2017-12-01 NOTE — ED Triage Notes (Signed)
Left elbow pain .  States her dog pulled her arm with a leash .

## 2017-12-01 NOTE — ED Provider Notes (Signed)
[facility] EMERGENCY DEPARTMENT Provider Note   CSN: [id number] Arrival date & time: 12/01/17  1130     History   Chief Complaint Chief Complaint  Patient presents with  . Joint Swelling    HPI KYLINN SHROPSHIRE is a 76 y.o. female.  Patient is a 76 year old female who presents to the emergency department with a complaint of left elbow pain.  The patient states she woke up this morning with pain in her left elbow.  The pain is aggravated by certain movements.  Patient states that approximately 1-1/2 weeks ago she was taking care of a pit bull terrier dog and upon going down steps the dog jerked her left arm.  She had some discomfort, but was able to work through it with conservative measures.  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  The patient states however in the last couple of days the pain has been getting progressively worse.  The patient states that she has pain more and more with certain movements.  No other injuries reported.  Patient denies any loss of use of the right or left upper extremity.  Patient states she made a makeshift sling and that helped some but did not resolve the issue.   The history is provided by the patient.    Past Medical History:  Diagnosis Date  . Asthma   . Complication of anesthesia    hard to wake  . COPD (chronic obstructive pulmonary disease) ([place])   . Dysphagia    requiring multiple dilations in past  . Irregular heart beat   . S/P colonoscopy 2009   dr. Gala Romney: anal papilla, 2 cecal AVMs. Due in 2014  . S/P endoscopy 2012   Dr. Gala Romney: mild chronic gastritis, bulbar erosions, s/p 54-French Maloney dilation   . S/P endoscopy 2003, 2004, 2007   gastritis, duodenitis, s/p dilation, +H.pylori     Patient Active Problem List   Diagnosis Date Noted  . Hypoxia 11/28/2015  . COPD exacerbation ([place])  11/28/2015  . Acute respiratory failure with hypoxia ([place]) 11/28/2015  . SOB (shortness of breath) 11/28/2015  . Mucosal abnormality of stomach   . History of colonic polyps 04/25/2015  . 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 Esophageal dysphagia 03/11/2011    Past Surgical History:  Procedure Laterality Date  . APPENDECTOMY    . CHOLECYSTECTOMY    . COLONOSCOPY  10/10/2008   RMR: 1. Anal papilla, otherwise normal rectum. 2. Two cecal arteriovenous malformations, colonic mucosa apperaed normal.   . COLONOSCOPY N/A 05/10/2015   RMR: Rectal and colonic polyps removed as described above. Ascending colon AVMs . Rectal polyps were tubular adenomas. Next colonoscopy May 2021.  . ESOPHAGEAL DILATION N/A 05/10/2015   Procedure: ESOPHAGEAL DILATION;  Surgeon: Daneil Dolin, MD;  Location: [facility];  Service: Endoscopy;  Laterality: N/A;  . ESOPHAGOGASTRODUODENOSCOPY  04/02/2011   RMR: 1. Endoscopically normal -appearing esophagus status post passage of a Maloney dilator followed by biopsy. 2. Hiatal hernia, Mottling, and submucosal petechiae in teh gastric mucoas of uncertain significance status post biopsy. 3. Bulbar erosions as described above.   . ESOPHAGOGASTRODUODENOSCOPY N/A 05/10/2015   RMR: Normal esophagus status post Maloney dilation. Small hiatal hernia. Abnormal gastric because of doubtful clinical significance status post biopsy (benign)  . INGUINAL HERNIA REPAIR    . TONSILLECTOMY    .  TOTAL ABDOMINAL HYSTERECTOMY      OB History    Gravida Para Term Preterm AB Living   5 5 4 1   4    SAB TAB Ectopic Multiple Live Births                   Home Medications    Prior to Admission medications   Medication Sig Start Date End Date Taking? Authorizing Provider  albuterol (PROVENTIL HFA;VENTOLIN HFA) 108 (90 BASE) MCG/ACT inhaler Inhale 2 puffs into the lungs every 6 (six) hours as needed for wheezing or shortness of breath. 09/04/15   [name], MD  albuterol (PROVENTIL) (2.5  MG/3ML) 0.083% nebulizer solution Take 3 mLs (2.5 mg total) by nebulization every 4 (four) hours as needed for wheezing or shortness of breath. 09/04/15   [name], MD  dextromethorphan-guaiFENesin [facility] DM) 30-600 MG 12hr tablet Take 1 tablet by mouth daily as needed for cough.    [provider]  fluticasone furoate-vilanterol (BREO ELLIPTA) 100-25 MCG/INH AEPB Inhale 1 puff into the lungs daily.    [provider]    Family History Family History  Problem Relation Age of Onset  . Heart disease Mother   . Lung cancer Father   . Colon cancer Neg Hx     Social History Social History   Tobacco Use  . Smoking status: Current Every Day Smoker    Packs/day: 1.00    Years: 57.00    Pack years: 57.00    Types: Cigarettes    Start date: 08/18/1959  . Smokeless tobacco: Never Used  Substance Use Topics  . Alcohol use: No    Alcohol/week: 0.0 oz  . Drug use: No     Allergies   Patient has no known allergies.   Review of Systems Review of Systems  Constitutional: Negative for activity change.       All ROS Neg except as noted in HPI  HENT: Negative for nosebleeds.   Eyes: Negative for photophobia and discharge.  Respiratory: Negative for cough, shortness of breath and wheezing.   Cardiovascular: Negative for chest pain and palpitations.  Gastrointestinal: Negative for abdominal pain and blood in stool.  Genitourinary: Negative for dysuria, frequency and hematuria.  Musculoskeletal: Positive for arthralgias and neck pain. Negative for back pain.  Skin: Negative.   Neurological: Negative for dizziness, seizures and speech difficulty.  Psychiatric/Behavioral: Negative for confusion and hallucinations.     Physical Exam Updated Vital Signs BP (!) 144/83   Pulse 75   Temp 97.6 F (36.4 C) (Oral)   Resp 18   Ht 5\' 4"  (1.626 m)   Wt 43.5 kg (96 lb)   SpO2 97%   BMI 16.48 kg/m   Physical Exam  Constitutional: She is oriented to person, place,  and time. She appears well-developed and well-nourished.  Non-toxic appearance.  HENT:  Head: Normocephalic.  Right Ear: Tympanic membrane and external ear normal.  Left Ear: Tympanic membrane and external ear normal.  Eyes: EOM and lids are normal. Pupils are equal, round, and reactive to light.  Neck: Normal range of motion. Neck supple. Carotid bruit is not present.  Cardiovascular: Normal rate, regular rhythm, normal heart sounds, intact distal pulses and normal pulses.  Pulmonary/Chest: Breath sounds normal. No respiratory distress.  Abdominal: Soft. Bowel sounds are normal. There is no tenderness. There is no guarding.  Musculoskeletal: Normal range of motion.       Left elbow: She exhibits normal range of motion and no deformity.  Tenderness found. No radial head, no lateral epicondyle and no olecranon process tenderness noted.       Left upper arm: She exhibits tenderness.  Pain of the left upper arm and elbow with ROM of the left elbow. Full range of motion of the left shoulder with mild crepitus.  Full range of motion of the left wrist and fingers.  Capillary refill is less than 2 seconds.  Radial pulse is 2+ bilaterally.  Lymphadenopathy:       Head (right side): No submandibular adenopathy present.       Head (left side): No submandibular adenopathy present.    She has no cervical adenopathy.  Neurological: She is alert and oriented to person, place, and time. She has normal strength. No cranial nerve deficit or sensory deficit.  Skin: Skin is warm and dry.  Psychiatric: She has a normal mood and affect. Her speech is normal.  Nursing note and vitals reviewed.    ED Treatments / Results  Labs (all labs ordered are listed, but only abnormal results are displayed) Labs Reviewed - No data to display  EKG  EKG Interpretation None       Radiology No results found.  Procedures Procedures (including critical care time)  Medications Ordered in ED Medications    predniSONE (DELTASONE) tablet 40 mg (40 mg Oral Given 12/01/17 1305)  acetaminophen (TYLENOL) tablet 1,000 mg (1,000 mg Oral Given 12/01/17 1303)     Initial Impression / Assessment and Plan / ED Course  I have reviewed the triage vital signs and the nursing notes.  Pertinent labs & imaging results that were available during my care of the patient were reviewed by me and considered in my medical decision making (see chart for details).       Final Clinical Impressions(s) / ED Diagnoses MDM Patient sustained injury to the left arm following a large dog jerking left arm.  The patient is noticing increasing pain in the area.  There is no evidence of dislocation on examination.  There are no neurovascular deficits appreciated on examination.  I suspect the patient has a strain/sprain involving the elbow area and extending into the humerus area.  This is probably being aggravated by inflammatory response.  The patient is fitted with a sling.  Patient is asked to use Decadron 2 times daily.  Tylenol extra strength every 4 hours.  Patient will use Ultram for more severe pain.  I have asked the patient to see Dr. Luan Pulling for follow-up and possible orthopedic referral if this is not improving.  The patient is in agreement with this plan.  With   Final diagnoses:  Left upper arm pain    ED Discharge Orders        Ordered    dexamethasone (DECADRON) 4 MG tablet  2 times daily with meals     12/01/17 1335    traMADol (ULTRAM) 50 MG tablet  Every 6 hours PRN     12/01/17 1335       Lily Kocher, PA-C 12/01/17 1418    Milton Ferguson, MD 12/02/17 [phone]

## 2017-12-02 ENCOUNTER — Other Ambulatory Visit: Payer: Self-pay

## 2017-12-02 NOTE — Patient Outreach (Signed)
[facility]) Care Management  12/02/2017  ILAH BOULE 1941/11/19 [id number]   1stTelephone call to patient for ED Utilization screening.  No answer there was no dial tone and unable to leave a message.  Plan:  RN Health coach will make an outreach attempt to the patient within three business days.  Lazaro Arms RN, BSN, [place] Direct Dial:  [phone] Fax: [phone]

## 2017-12-04 ENCOUNTER — Ambulatory Visit: Payer: Self-pay

## 2017-12-04 NOTE — Patient Outreach (Signed)
[facility]) Care Management  12/04/2017  GERALDYN SHAIN 20-May-1941 [id number]   "[facility] Care Management criteria for ED census screening calls is 6 ED visits in 6 months. This patient does not meet this criteria, case is being closed."  Lazaro Arms RN, BSN, [place]:  [phone] Fax: [phone]

## 2017-12-15 ENCOUNTER — Emergency Department (HOSPITAL_COMMUNITY): Payer: Medicare Other

## 2017-12-15 ENCOUNTER — Emergency Department (HOSPITAL_COMMUNITY)
Admission: EM | Admit: 2017-12-15 | Discharge: 2017-12-15 | Disposition: A | Discharge status: Home / Self Care | Payer: Medicare Other | Attending: Emergency Medicine | Admitting: Emergency Medicine

## 2017-12-15 ENCOUNTER — Encounter (HOSPITAL_COMMUNITY): Payer: Self-pay | Admitting: Emergency Medicine

## 2017-12-15 ENCOUNTER — Other Ambulatory Visit: Payer: Self-pay

## 2017-12-15 DIAGNOSIS — J449 Chronic obstructive pulmonary disease, unspecified: Secondary | ICD-10-CM | POA: Diagnosis not present

## 2017-12-15 DIAGNOSIS — F17210 Nicotine dependence, cigarettes, uncomplicated: Secondary | ICD-10-CM | POA: Diagnosis not present

## 2017-12-15 DIAGNOSIS — Z79899 Other long term (current) drug therapy: Secondary | ICD-10-CM | POA: Insufficient documentation

## 2017-12-15 DIAGNOSIS — M25522 Pain in left elbow: Secondary | ICD-10-CM | POA: Insufficient documentation

## 2017-12-15 HISTORY — DX: Pain in left elbow: M25.522

## 2017-12-15 HISTORY — DX: Cervicalgia: M54.2

## 2017-12-15 HISTORY — DX: Other chronic pain: G89.29

## 2017-12-15 MED ORDER — NAPROXEN 500 MG PO TABS: 500.0000 mg | ORAL_TABLET | Freq: Two times a day (BID) | ORAL | 0 refills | Status: DC

## 2017-12-15 MED ORDER — LIDOCAINE 5 % EX PTCH: 1.0000 | MEDICATED_PATCH | CUTANEOUS | 0 refills | Status: DC

## 2017-12-15 NOTE — ED Provider Notes (Signed)
[facility] EMERGENCY DEPARTMENT Provider Note   CSN: [id number] Arrival date & time: 12/15/17  1324     History   Chief Complaint Chief Complaint  Patient presents with  . Elbow Pain    HPI Katelyn [name] is a 76 y.o. female history of chronic neck pain presents the emergency department complaining of continued intermittent left elbow pain since injury 3-4 weeks ago.  Patient states that she was holding a leash walking down a few steps when her dog jerked forward causing hyperextension of the elbow.  She has had intermittent problems with discomfort in the left elbow since this incident.  Was seen in the emergency department 12/01/17 for same and was sent home with tramadol and dexamethasone.  Patient states that these medications did not improve her pain.  Pain seems to be worse with certain movements, however not always. Patient states no significant alleviating factors.  At present she is not having any pain.  Patient states that she is upset that an x-ray was not ordered at her previous emergency department visit, requesting x-ray today.  Denies numbness, weakness, fever, or chills.  Denies neck pain, shoulder pain, wrist pain, or hand pain  HPI  Past Medical History:  Diagnosis Date  . Asthma   . Chronic neck pain   . Chronic pain of left elbow   . Complication of anesthesia    hard to wake  . COPD (chronic obstructive pulmonary disease) ([place])   . Dysphagia    requiring multiple dilations in past  . Irregular heart beat   . S/P colonoscopy 2009   dr. Gala Romney: anal papilla, 2 cecal AVMs. Due in 2014  . S/P endoscopy 2012   Dr. Gala Romney: mild chronic gastritis, bulbar erosions, s/p 54-French Maloney dilation   . S/P endoscopy 2003, 2004, 2007   gastritis, duodenitis, s/p dilation, +H.pylori     Patient Active Problem List   Diagnosis Date Noted  . Hypoxia 11/28/2015  . COPD exacerbation ([place]) 11/28/2015  . Acute respiratory failure with hypoxia ([place]) 11/28/2015  . SOB  (shortness of breath) 11/28/2015  . Mucosal abnormality of stomach   . History of colonic polyps 04/25/2015  . 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 Esophageal dysphagia 03/11/2011    Past Surgical History:  Procedure Laterality Date  . APPENDECTOMY    . CHOLECYSTECTOMY    . COLONOSCOPY  10/10/2008   RMR: 1. Anal papilla, otherwise normal rectum. 2. Two cecal arteriovenous malformations, colonic mucosa apperaed normal.   . COLONOSCOPY N/A 05/10/2015   RMR: Rectal and colonic polyps removed as described above. Ascending colon AVMs . Rectal polyps were tubular adenomas. Next colonoscopy May 2021.  . ESOPHAGEAL DILATION N/A 05/10/2015   Procedure: ESOPHAGEAL DILATION;  Surgeon: Daneil Dolin, MD;  Location: [facility];  Service: Endoscopy;  Laterality: N/A;  . ESOPHAGOGASTRODUODENOSCOPY  04/02/2011   RMR: 1. Endoscopically normal -appearing esophagus status post passage of a Maloney dilator followed by biopsy. 2. Hiatal hernia, Mottling, and submucosal petechiae in teh gastric mucoas of uncertain significance status post biopsy. 3. Bulbar erosions as described above.   . ESOPHAGOGASTRODUODENOSCOPY N/A 05/10/2015   RMR: Normal esophagus status post Maloney dilation. Small hiatal hernia. Abnormal gastric because of doubtful clinical significance status post biopsy (benign)  . INGUINAL HERNIA REPAIR    . TONSILLECTOMY    . TOTAL ABDOMINAL HYSTERECTOMY      OB History    Gravida Para Term Preterm AB Living   5 5 4 1    4  SAB TAB Ectopic Multiple Live Births                   Home Medications    Prior to Admission medications   Medication Sig Start Date End Date Taking? Authorizing Provider  albuterol (PROVENTIL HFA;VENTOLIN HFA) 108 (90 BASE) MCG/ACT inhaler Inhale 2 puffs into the lungs every 6 (six) hours as needed for wheezing or shortness of breath. 09/04/15   [name], MD  albuterol (PROVENTIL) (2.5 MG/3ML) 0.083% nebulizer solution Take 3 mLs (2.5 mg total) by nebulization  every 4 (four) hours as needed for wheezing or shortness of breath. 09/04/15   [name], MD  dexamethasone (DECADRON) 4 MG tablet Take 1 tablet (4 mg total) by mouth 2 (two) times daily with a meal. 12/01/17   Lily Kocher, PA-C  dextromethorphan-guaiFENesin [facility] DM) 30-600 MG 12hr tablet Take 1 tablet by mouth daily as needed for cough.    [provider]  fluticasone furoate-vilanterol (BREO ELLIPTA) 100-25 MCG/INH AEPB Inhale 1 puff into the lungs daily.    [provider]  traMADol (ULTRAM) 50 MG tablet Take 1 tablet (50 mg total) by mouth every 6 (six) hours as needed. 12/01/17   Lily Kocher, PA-C    Family History Family History  Problem Relation Age of Onset  . Heart disease Mother   . Lung cancer Father   . Colon cancer Neg Hx     Social History Social History   Tobacco Use  . Smoking status: Current Every Day Smoker    Packs/day: 1.00    Years: 57.00    Pack years: 57.00    Types: Cigarettes    Start date: 08/18/1959  . Smokeless tobacco: Never Used  Substance Use Topics  . Alcohol use: No    Alcohol/week: 0.0 oz  . Drug use: No     Allergies   Patient has no known allergies.   Review of Systems Review of Systems  Constitutional: Negative for chills and fever.  Musculoskeletal: Positive for arthralgias (L elbow). Negative for neck pain.  Neurological: Negative for weakness and numbness.     Physical Exam Updated Vital Signs BP 134/86 (BP Location: Right Arm)   Pulse 65   Temp 98.1 F (36.7 C) (Oral)   Resp 18   Ht 5\' 4"  (1.626 m)   Wt 43.5 kg (96 lb)   SpO2 100%   BMI 16.48 kg/m   Physical Exam  Constitutional: She appears well-developed and well-nourished. No distress.  HENT:  Head: Normocephalic and atraumatic.  Eyes: Conjunctivae are normal. Right eye exhibits no discharge. Left eye exhibits no discharge.  Neck: Normal range of motion. No spinous process tenderness present.  Cardiovascular:  Pulses:       Radial pulses are 2+ on the right side, and 2+ on the left side.  Musculoskeletal:  Upper Extremities: No obvious deformity, erythema, warmth, open wounds, or appreciable swelling.  At present patient has full painless range of motion of bilateral shoulders, elbows, wrists, and digits.  There is no bony tenderness, furthermore there is no bony tenderness to the olecranon, radial head, medial epicondyle, or lateral epicondyles of the left elbow.  Neurological: She is alert.  Clear speech. Bilateral upper extremities' sensation intact to sharp and dull touch. 5/5 grip strength bilaterally.  Skin: Capillary refill takes less than 2 seconds.  Psychiatric: She has a normal mood and affect. Her behavior is normal. Thought content normal.  Nursing note and vitals reviewed.    ED Treatments /  Results  Labs (all labs ordered are listed, but only abnormal results are displayed) Labs Reviewed - No data to display  EKG  EKG Interpretation None       Radiology Dg Elbow Complete Left  Result Date: 12/15/2017 CLINICAL DATA:  Pain EXAM: LEFT ELBOW - COMPLETE 3+ VIEW COMPARISON:  10/03/2014 FINDINGS: There is no evidence of fracture, dislocation, or joint effusion. There is no evidence of arthropathy or other focal bone abnormality. Soft tissues are unremarkable. IMPRESSION: Negative. Electronically Signed   By: Rolm Baptise M.D.   On: 12/15/2017 09:54   Procedures Procedures (including critical care time)  Medications Ordered in ED Medications - No data to display   Initial Impression / Assessment and Plan / ED Course  I have reviewed the triage vital signs and the nursing notes.  Pertinent labs & imaging results that were available during my care of the patient were reviewed by me and considered in my medical decision making (see chart for details).   Presents with ongoing left elbow pain since hyperextension 3-4 weeks ago, no pain at present, no subsequent injury.  Patient has full range  of motion of the joint, no bony tenderness, and she is neurovascularly intact distally.  Patient requesting x-ray of the left elbow, I discussed with her that I had a low suspicion for fracture or dislocation given her history and physical exam, however patient continued to request imaging. X-ray ordered and negative. Patient is afebrile, no recent surgical procedure or break in the skin, no joint tenderness/swelling/warmth/erythema on exam, doubt septic joint or gout. No neck, shoulder, or wrist discomfort. Suspect muscle related soreness, no definite etiology at this time. Will treat supportively with Naproxen (last creatinine on record 0.93) and Lidoderm patches with orthopedics follow up. I discussed results, treatment plan, need for orthopedics follow-up, and return precautions with the patient. Provided opportunity for questions, patient confirmed understanding and is in agreement with plan.   Final Clinical Impressions(s) / ED Diagnoses   Final diagnoses:  Left elbow pain    ED Discharge Orders        Ordered    naproxen (NAPROSYN) 500 MG tablet  2 times daily     12/15/17 1024    lidocaine (LIDODERM) 5 %  Every 24 hours     12/15/17 1024       Debbi Strandberg, [place] R, PA-C 12/15/17 1038    Francine Graven, DO 12/17/17 1918

## 2017-12-15 NOTE — ED Triage Notes (Signed)
Pt reports continued left elbow pain. Pt reports was seen for same several weeks ago after family dog "pulled" arm while running down stairs. Pt denies any new or recent injury. No deformity noted.

## 2017-12-15 NOTE — Discharge Instructions (Signed)
Please read and follow all provided instructions.  You have been seen today for left elbow pain.   Tests performed today include: An x-ray of the affected area - does NOT show any broken bones or dislocations.  Vital signs. See below for your results today.   I have prescribed you two medications today:  1. Naproxen- this is a nonsteroidal anti-inflammatory medicine that helps with pain and swelling. You may take this once every 12 hours as needed for pain. Be sure to take this with food as it can cause stomach upset, and at worst stomach bleeding. Do NOT take other nonsteroidal antiinflammatory medicines such as Advil, Aleve, Motrin, or Ibuprofen while taking this medicine as it can further irritate your stomach.  2. Lidoderm patches-these are topical patches that you may apply to your elbow daily to help with pain. If these are expensive at the pharmacy be sure to ask your pharmacist where the over the counter version of these are.   You may supplement with Tylenol and with application of ice or heat whichever feels better.   Follow-up instructions: Please follow-up with your primary care provider or the provided orthopedic physician (bone specialist) if you continue to have significant pain in 1 week. In this case you may have a more severe injury that requires further care as well as for further management and re-evaluation.   Return instructions:  Please return if your fingers or hand are numb or tingling, appear gray or blue, or you have severe pain  Please return to the Emergency Department if you experience worsening symptoms.  Please return if you have any other emergent concerns. Additional Information:  Your vital signs today were: BP 134/86 (BP Location: Right Arm)    Pulse 65    Temp 98.1 F (36.7 C) (Oral)    Resp 18    Ht 5\' 4"  (1.626 m)    Wt 43.5 kg (96 lb)    SpO2 100%    BMI 16.48 kg/m  If your blood pressure (BP) was elevated above 135/85 this visit, please have this  repeated by your doctor within one month. ---------------

## 2017-12-25 ENCOUNTER — Ambulatory Visit (HOSPITAL_COMMUNITY)
Admission: RE | Admit: 2017-12-25 | Discharge: 2017-12-25 | Disposition: A | Discharge status: Home / Self Care | Payer: Medicare Other | Source: Ambulatory Visit | Attending: Pulmonary Disease | Admitting: Pulmonary Disease

## 2017-12-25 ENCOUNTER — Other Ambulatory Visit (HOSPITAL_COMMUNITY): Payer: Self-pay | Admitting: Pulmonary Disease

## 2017-12-25 DIAGNOSIS — E46 Unspecified protein-calorie malnutrition: Secondary | ICD-10-CM | POA: Diagnosis not present

## 2017-12-25 DIAGNOSIS — R079 Chest pain, unspecified: Secondary | ICD-10-CM | POA: Diagnosis not present

## 2017-12-25 DIAGNOSIS — R0602 Shortness of breath: Secondary | ICD-10-CM | POA: Diagnosis not present

## 2017-12-25 DIAGNOSIS — R634 Abnormal weight loss: Secondary | ICD-10-CM | POA: Diagnosis not present

## 2017-12-25 DIAGNOSIS — R05 Cough: Secondary | ICD-10-CM | POA: Diagnosis not present

## 2017-12-25 DIAGNOSIS — J449 Chronic obstructive pulmonary disease, unspecified: Secondary | ICD-10-CM | POA: Diagnosis not present

## 2018-02-02 ENCOUNTER — Encounter (HOSPITAL_COMMUNITY): Payer: Self-pay | Admitting: *Deleted

## 2018-02-02 ENCOUNTER — Inpatient Hospital Stay (HOSPITAL_COMMUNITY)
Admission: EM | Admit: 2018-02-02 | Discharge: 2018-02-06 | DRG: 190 | Disposition: A | Discharge status: Home / Self Care | Payer: Medicare Other | Attending: Pulmonary Disease | Admitting: Pulmonary Disease

## 2018-02-02 ENCOUNTER — Emergency Department (HOSPITAL_COMMUNITY): Payer: Medicare Other

## 2018-02-02 ENCOUNTER — Other Ambulatory Visit: Payer: Self-pay

## 2018-02-02 DIAGNOSIS — R05 Cough: Secondary | ICD-10-CM | POA: Diagnosis not present

## 2018-02-02 DIAGNOSIS — Z681 Body mass index (BMI) 19 or less, adult: Secondary | ICD-10-CM | POA: Diagnosis not present

## 2018-02-02 DIAGNOSIS — R0603 Acute respiratory distress: Secondary | ICD-10-CM | POA: Diagnosis not present

## 2018-02-02 DIAGNOSIS — Z66 Do not resuscitate: Secondary | ICD-10-CM | POA: Diagnosis not present

## 2018-02-02 DIAGNOSIS — Z79899 Other long term (current) drug therapy: Secondary | ICD-10-CM

## 2018-02-02 DIAGNOSIS — F17210 Nicotine dependence, cigarettes, uncomplicated: Secondary | ICD-10-CM | POA: Diagnosis not present

## 2018-02-02 DIAGNOSIS — E46 Unspecified protein-calorie malnutrition: Secondary | ICD-10-CM | POA: Diagnosis present

## 2018-02-02 DIAGNOSIS — J441 Chronic obstructive pulmonary disease with (acute) exacerbation: Secondary | ICD-10-CM | POA: Diagnosis not present

## 2018-02-02 DIAGNOSIS — Z7951 Long term (current) use of inhaled steroids: Secondary | ICD-10-CM

## 2018-02-02 DIAGNOSIS — J9601 Acute respiratory failure with hypoxia: Secondary | ICD-10-CM | POA: Diagnosis present

## 2018-02-02 DIAGNOSIS — J449 Chronic obstructive pulmonary disease, unspecified: Secondary | ICD-10-CM | POA: Diagnosis present

## 2018-02-02 LAB — BASIC METABOLIC PANEL
Anion gap: 10 (ref 5–15)
BUN: 21 mg/dL — AB (ref 6–20)
CHLORIDE: 103 mmol/L (ref 101–111)
CO2: 27 mmol/L (ref 22–32)
CREATININE: 0.99 mg/dL (ref 0.44–1.00)
Calcium: 9.2 mg/dL (ref 8.9–10.3)
GFR calc non Af Amer: 55 mL/min — ABNORMAL LOW (ref >60)
Glucose, Bld: 83 mg/dL (ref 65–99)
Potassium: 4.1 mmol/L (ref 3.5–5.1)
Sodium: 140 mmol/L (ref 135–145)

## 2018-02-02 LAB — CBC
HCT: 35.7 % — ABNORMAL LOW (ref 36.0–46.0)
Hemoglobin: 11.1 g/dL — ABNORMAL LOW (ref 12.0–15.0)
MCH: 26.5 pg (ref 26.0–34.0)
MCHC: 31.1 g/dL (ref 30.0–36.0)
MCV: 85.2 fL (ref 78.0–100.0)
PLATELETS: 371 10*3/uL (ref 150–400)
RBC: 4.19 MIL/uL (ref 3.87–5.11)
RDW: 15 % (ref 11.5–15.5)
WBC: 9 10*3/uL (ref 4.0–10.5)

## 2018-02-02 MED ORDER — IPRATROPIUM-ALBUTEROL 0.5-2.5 (3) MG/3ML IN SOLN
3.0000 mL | Freq: Once | RESPIRATORY_TRACT | Status: AC
  Administered 2018-02-02: 3 mL via RESPIRATORY_TRACT

## 2018-02-02 MED ORDER — METHYLPREDNISOLONE SODIUM SUCC 125 MG IJ SOLR
125.0000 mg | Freq: Once | INTRAMUSCULAR | Status: AC
  Administered 2018-02-02: 125 mg via INTRAVENOUS
  Filled 2018-02-02: qty 2

## 2018-02-02 MED ORDER — ALBUTEROL SULFATE (2.5 MG/3ML) 0.083% IN NEBU
5.0000 mg | INHALATION_SOLUTION | Freq: Once | RESPIRATORY_TRACT | Status: AC
  Administered 2018-02-02: 5 mg via RESPIRATORY_TRACT
  Filled 2018-02-02: qty 6

## 2018-02-02 MED ORDER — LEVOFLOXACIN 500 MG PO TABS
500.0000 mg | ORAL_TABLET | Freq: Once | ORAL | Status: AC
  Administered 2018-02-02: 500 mg via ORAL
  Filled 2018-02-02: qty 1

## 2018-02-02 MED ORDER — IPRATROPIUM-ALBUTEROL 0.5-2.5 (3) MG/3ML IN SOLN
RESPIRATORY_TRACT | Status: AC
  Administered 2018-02-02: 3 mL via RESPIRATORY_TRACT
  Filled 2018-02-02: qty 3

## 2018-02-02 NOTE — ED Triage Notes (Signed)
Pt reports increased SOB x 3 days. Pt hx of COPD and asthma. States no relief with home neb tx.

## 2018-02-02 NOTE — ED Notes (Signed)
Pt tolerated ambulation around nurses station with steady gait, audible wheezing (which was present before ambulation), pt states she feels much better than she did intitally when arrived in the ED. o2 sats ranging from 89%-94%. MD aware.

## 2018-02-02 NOTE — ED Notes (Signed)
Patient transported to X-ray 

## 2018-02-02 NOTE — H&P (Signed)
TRH H&P    Patient Demographics:    Katelyn [name], is a 77 y.o. female  MRN: [id number]  DOB - 1941-02-25  Admit Date - 02/02/2018  Referring MD/NP/PA: Noemi Chapel  Outpatient Primary MD for the patient is Sinda Du, MD  Patient coming from: home   Chief complaint-shortness of breath  HPI:    Katelyn [name]  is a 77 y.o. female, with history of asthma, COPD, ongoing tobacco abuse came to ED after patient had several days of worsening shortness of breath. Also increasing amount of cough with clear sputum. She denies any body aches. No fever or chills. She denies chest pain. She denies nausea vomiting or diarrhea. Complains of dyspnea on exertion denies dysuria urgency or frequency of urination.  In the ED, chest x-ray showed chronic pulmonary hyperinflation. No acute cardiac pulmonary abnormality.    Review of systems:     All other systems reviewed and are negative.   With Past History of the following :    Past Medical History:  Diagnosis Date  . Asthma   . Chronic neck pain   . Chronic pain of left elbow   . Complication of anesthesia    hard to wake  . COPD (chronic obstructive pulmonary disease) ([place])   . Dysphagia    requiring multiple dilations in past  . Irregular heart beat   . S/P colonoscopy 2009   dr. Gala Romney: anal papilla, 2 cecal AVMs. Due in 2014  . S/P endoscopy 2012   Dr. Gala Romney: mild chronic gastritis, bulbar erosions, s/p 54-French Maloney dilation   . S/P endoscopy 2003, 2004, 2007   gastritis, duodenitis, s/p dilation, +H.pylori       Past Surgical History:  Procedure Laterality Date  . APPENDECTOMY    . CHOLECYSTECTOMY    . COLONOSCOPY  10/10/2008   RMR: 1. Anal papilla, otherwise normal rectum. 2. Two cecal arteriovenous malformations, colonic mucosa apperaed normal.   . COLONOSCOPY N/A 05/10/2015   RMR: Rectal and colonic polyps removed as described above.  Ascending colon AVMs . Rectal polyps were tubular adenomas. Next colonoscopy May 2021.  . ESOPHAGEAL DILATION N/A 05/10/2015   Procedure: ESOPHAGEAL DILATION;  Surgeon: Daneil Dolin, MD;  Location: [facility];  Service: Endoscopy;  Laterality: N/A;  . ESOPHAGOGASTRODUODENOSCOPY  04/02/2011   RMR: 1. Endoscopically normal -appearing esophagus status post passage of a Maloney dilator followed by biopsy. 2. Hiatal hernia, Mottling, and submucosal petechiae in teh gastric mucoas of uncertain significance status post biopsy. 3. Bulbar erosions as described above.   . ESOPHAGOGASTRODUODENOSCOPY N/A 05/10/2015   RMR: Normal esophagus status post Maloney dilation. Small hiatal hernia. Abnormal gastric because of doubtful clinical significance status post biopsy (benign)  . INGUINAL HERNIA REPAIR    . TONSILLECTOMY    . TOTAL ABDOMINAL HYSTERECTOMY        Social History:      Social History   Tobacco Use  . Smoking status: Current Every Day Smoker    Packs/day: 1.00    Years: 57.00    Pack years:  57.00    Types: Cigarettes    Start date: 08/18/1959  . Smokeless tobacco: Never Used  Substance Use Topics  . Alcohol use: No    Alcohol/week: 0.0 oz       Family History :     Family History  Problem Relation Age of Onset  . Heart disease Mother   . Lung cancer Father   . Colon cancer Neg Hx       Home Medications:   Prior to Admission medications   Medication Sig Start Date End Date Taking? Authorizing Provider  albuterol (PROVENTIL HFA;VENTOLIN HFA) 108 (90 BASE) MCG/ACT inhaler Inhale 2 puffs into the lungs every 6 (six) hours as needed for wheezing or shortness of breath. 09/04/15  Yes [name], MD  albuterol (PROVENTIL) (2.5 MG/3ML) 0.083% nebulizer solution Take 3 mLs (2.5 mg total) by nebulization every 4 (four) hours as needed for wheezing or shortness of breath. 09/04/15  Yes [name], MD  fluticasone furoate-vilanterol (BREO ELLIPTA) 100-25 MCG/INH AEPB Inhale 1  puff into the lungs daily.   Yes [provider]  pseudoephedrine (SUDAFED) 30 MG tablet Take 30 mg by mouth every 4 (four) hours as needed for congestion.   Yes [provider]  dexamethasone (DECADRON) 4 MG tablet Take 1 tablet (4 mg total) by mouth 2 (two) times daily with a meal. Patient not taking: Reported on 02/02/2018 12/01/17   Lily Kocher, PA-C  lidocaine (LIDODERM) 5 % Place 1 patch onto the skin daily. Remove & Discard patch within 12 hours or as directed by MD Patient not taking: Reported on 02/02/2018 12/15/17   Petrucelli, Aldona Bar R, PA-C  naproxen (NAPROSYN) 500 MG tablet Take 1 tablet (500 mg total) by mouth 2 (two) times daily. Patient not taking: Reported on 02/02/2018 12/15/17   Petrucelli, Glynda Jaeger, PA-C  traMADol (ULTRAM) 50 MG tablet Take 1 tablet (50 mg total) by mouth every 6 (six) hours as needed. Patient not taking: Reported on 02/02/2018 12/01/17   Lily Kocher, PA-C  dexlansoprazole (DEXILANT) 60 MG capsule Take 1 capsule (60 mg total) by mouth daily. Patient not taking: Reported on 09/04/2015 06/20/15 09/04/15  Mahala Menghini, PA-C  sodium chloride (OCEAN) 0.65 % SOLN nasal spray Place 1 spray into both nostrils as needed for congestion. Patient not taking: Reported on 09/04/2015 08/08/14 09/04/15  Merryl Hacker, MD     Allergies:    No Known Allergies   Physical Exam:   Vitals  Blood pressure 116/67, pulse 80, resp. rate 20, height 5\' 4"  (1.626 m), weight 41.3 kg (91 lb), SpO2 96 %.  1.  General: appears in no acute distress  2. Psychiatric:  Intact judgement and  insight, awake alert, oriented x 3.  3. Neurologic: No focal neurological deficits, all cranial nerves intact.Strength 5/5 all 4 extremities, sensation intact all 4 extremities, plantars down going.  4. Eyes :  anicteric sclerae, moist conjunctivae with no lid lag. PERRLA.  5. ENMT:  Oropharynx clear with moist mucous membranes and good dentition  6. Neck:    supple, no cervical lymphadenopathy appriciated, No thyromegaly  7. Respiratory : Normal respiratory effort, bilateral wheezing  8. Cardiovascular : RRR, no gallops, rubs or murmurs, no leg edema  9. Gastrointestinal:  Positive bowel sounds, abdomen soft, non-tender to palpation,no hepatosplenomegaly, no rigidity or guarding       10. Skin:  No cyanosis, normal texture and turgor, no rash, lesions or ulcers  11.Musculoskeletal:  Good muscle tone,  joints appear normal ,  no effusions,  normal range of motion    Data Review:    CBC Recent Labs  Lab 02/02/18 1935  WBC 9.0  HGB 11.1*  HCT 35.7*  PLT 371  MCV 85.2  MCH 26.5  MCHC 31.1  RDW 15.0   ------------------------------------------------------------------------------------------------------------------  Chemistries  Recent Labs  Lab 02/02/18 1935  NA 140  K 4.1  CL 103  CO2 27  GLUCOSE 83  BUN 21*  CREATININE 0.99  CALCIUM 9.2   ------------------------------------------------------------------------------------------------------------------  ------------------------------------------------------------------------------------------------------------------  --------------------------------------------------------------------------------------------------------------- Urine analysis: No results found for: COLORURINE, APPEARANCEUR, LABSPEC, PHURINE, GLUCOSEU, HGBUR, BILIRUBINUR, KETONESUR, PROTEINUR, UROBILINOGEN, NITRITE, LEUKOCYTESUR    Imaging Results:    Dg Chest 2 View  Result Date: 02/02/2018 CLINICAL DATA:  77 year old female with cough and shortness of breath for 1 week. Smoker. EXAM: CHEST  2 VIEW COMPARISON:  12/25/2017 and earlier. FINDINGS: Upright AP and lateral views of the chest. Stable large lung volumes. Stable mediastinal scratched as stable cardiac and mediastinal contours. Visualized tracheal air column is within normal limits. No pneumothorax, pulmonary edema, pleural effusion or  confluent pulmonary opacity. No acute osseous abnormality identified. Negative visible bowel gas pattern. IMPRESSION: Chronic pulmonary hyperinflation. No acute cardiopulmonary abnormality. Electronically Signed   By: Genevie Ann M.D.   On: 02/02/2018 20:49    My personal review of EKG: Rhythm NSR   Assessment & Plan:    Active Problems:   COPD exacerbation ([place])   1. COPD exacerbation-please under observation, start Solu-Medrol 60 mg IV Q6 hours, albuterol and Atrovent nebulizers Q6 hours, Mucinex one tablet PO BID.   DVT Prophylaxis-   Lovenox   AM Labs Ordered, also please review Full Orders  Family Communication: Admission, patients condition and plan of care including tests being ordered have been discussed with the patient  who indicate understanding and agree with the plan and Code Status.  Code Status: DNR  Admission status: observation    Time spent in minutes : 60 minutes   Oswald Hillock M.D on 02/02/2018 at 11:58 PM  Between 7am to 7pm - Pager - [phone]. After 7pm go to [email or web address] - password [facility]  [facility] - Office  [phone]

## 2018-02-02 NOTE — ED Provider Notes (Signed)
[facility] EMERGENCY DEPARTMENT Provider Note   CSN: [id number] Arrival date & time: 02/02/18  1928     History   Chief Complaint Chief Complaint  Patient presents with  . Shortness of Breath    HPI Katelyn [name] is a 77 y.o. female.  HPI  The patient is a 77 year old female, she has a known history of asthma as well as COPD and is an ongoing smoker.  She states that over the last several days she has had increasing shortness of breath with wheezing, increasing amounts of cough productive of sputum but denies any fevers body aches sore throat nasal congestion or headache.  She has been using her albuterol inhaler approximately every 4 hours but despite this continues to be short of breath which is preventing her from being able to walk without significant dyspnea.  She cannot recall the last time she was on prednisone or admitted to the hospital.     Review of the medical record shows that she was last admitted in December 2016 approximately 2 years ago.  The pt sees Dr. Luan Pulling for her pulmonary treatment  She has had nebs at home without improvement today  She has had increased phlegm and cough and sob  Past Medical History:  Diagnosis Date  . Asthma   . Chronic neck pain   . Chronic pain of left elbow   . Complication of anesthesia    hard to wake  . COPD (chronic obstructive pulmonary disease) ([place])   . Dysphagia    requiring multiple dilations in past  . Irregular heart beat   . S/P colonoscopy 2009   dr. Gala Romney: anal papilla, 2 cecal AVMs. Due in 2014  . S/P endoscopy 2012   Dr. Gala Romney: mild chronic gastritis, bulbar erosions, s/p 54-French Maloney dilation   . S/P endoscopy 2003, 2004, 2007   gastritis, duodenitis, s/p dilation, +H.pylori     Patient Active Problem List   Diagnosis Date Noted  . Hypoxia 11/28/2015  . COPD exacerbation ([place]) 11/28/2015  . Acute respiratory failure with hypoxia ([place]) 11/28/2015  . SOB (shortness of breath) 11/28/2015  .  Mucosal abnormality of stomach   . History of colonic polyps 04/25/2015  . 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 Esophageal dysphagia 03/11/2011    Past Surgical History:  Procedure Laterality Date  . APPENDECTOMY    . CHOLECYSTECTOMY    . COLONOSCOPY  10/10/2008   RMR: 1. Anal papilla, otherwise normal rectum. 2. Two cecal arteriovenous malformations, colonic mucosa apperaed normal.   . COLONOSCOPY N/A 05/10/2015   RMR: Rectal and colonic polyps removed as described above. Ascending colon AVMs . Rectal polyps were tubular adenomas. Next colonoscopy May 2021.  . ESOPHAGEAL DILATION N/A 05/10/2015   Procedure: ESOPHAGEAL DILATION;  Surgeon: Daneil Dolin, MD;  Location: [facility]ITE;  Service: Endoscopy;  Laterality: N/A;  . ESOPHAGOGASTRODUODENOSCOPY  04/02/2011   RMR: 1. Endoscopically normal -appearing esophagus status post passage of a Maloney dilator followed by biopsy. 2. Hiatal hernia, Mottling, and submucosal petechiae in teh gastric mucoas of uncertain significance status post biopsy. 3. Bulbar erosions as described above.   . ESOPHAGOGASTRODUODENOSCOPY N/A 05/10/2015   RMR: Normal esophagus status post Maloney dilation. Small hiatal hernia. Abnormal gastric because of doubtful clinical significance status post biopsy (benign)  . INGUINAL HERNIA REPAIR    . TONSILLECTOMY    . TOTAL ABDOMINAL HYSTERECTOMY      OB History    Gravida Para Term Preterm AB Living   5  5 4 1   4    SAB TAB Ectopic Multiple Live Births                   Home Medications    Prior to Admission medications   Medication Sig Start Date End Date Taking? Authorizing Provider  albuterol (PROVENTIL HFA;VENTOLIN HFA) 108 (90 BASE) MCG/ACT inhaler Inhale 2 puffs into the lungs every 6 (six) hours as needed for wheezing or shortness of breath. 09/04/15  Yes [name], MD  albuterol (PROVENTIL) (2.5 MG/3ML) 0.083% nebulizer solution Take 3 mLs (2.5 mg total) by nebulization every 4 (four) hours as needed for  wheezing or shortness of breath. 09/04/15  Yes [name], MD  fluticasone furoate-vilanterol (BREO ELLIPTA) 100-25 MCG/INH AEPB Inhale 1 puff into the lungs daily.   Yes [provider]  pseudoephedrine (SUDAFED) 30 MG tablet Take 30 mg by mouth every 4 (four) hours as needed for congestion.   Yes [provider]  dexamethasone (DECADRON) 4 MG tablet Take 1 tablet (4 mg total) by mouth 2 (two) times daily with a meal. Patient not taking: Reported on 02/02/2018 12/01/17   Lily Kocher, PA-C  lidocaine (LIDODERM) 5 % Place 1 patch onto the skin daily. Remove & Discard patch within 12 hours or as directed by MD Patient not taking: Reported on 02/02/2018 12/15/17   Petrucelli, Aldona Bar R, PA-C  naproxen (NAPROSYN) 500 MG tablet Take 1 tablet (500 mg total) by mouth 2 (two) times daily. Patient not taking: Reported on 02/02/2018 12/15/17   Petrucelli, Glynda Jaeger, PA-C  traMADol (ULTRAM) 50 MG tablet Take 1 tablet (50 mg total) by mouth every 6 (six) hours as needed. Patient not taking: Reported on 02/02/2018 12/01/17   Lily Kocher, PA-C  dexlansoprazole (DEXILANT) 60 MG capsule Take 1 capsule (60 mg total) by mouth daily. Patient not taking: Reported on 09/04/2015 06/20/15 09/04/15  Mahala Menghini, PA-C  sodium chloride (OCEAN) 0.65 % SOLN nasal spray Place 1 spray into both nostrils as needed for congestion. Patient not taking: Reported on 09/04/2015 08/08/14 09/04/15  Horton, Barbette Hair, MD    Family History Family History  Problem Relation Age of Onset  . Heart disease Mother   . Lung cancer Father   . Colon cancer Neg Hx     Social History Social History   Tobacco Use  . Smoking status: Current Every Day Smoker    Packs/day: 1.00    Years: 57.00    Pack years: 57.00    Types: Cigarettes    Start date: 08/18/1959  . Smokeless tobacco: Never Used  Substance Use Topics  . Alcohol use: No    Alcohol/week: 0.0 oz  . Drug use: No     Allergies   Patient has no  known allergies.   Review of Systems Review of Systems  All other systems reviewed and are negative.    Physical Exam Updated Vital Signs Ht 5\' 4"  (1.626 m)   Wt 41.3 kg (91 lb)   SpO2 94%   BMI 15.62 kg/m   Physical Exam  Constitutional: She appears well-developed and well-nourished. She appears ill. She appears distressed.  HENT:  Head: Normocephalic and atraumatic.  Mouth/Throat: Oropharynx is clear and moist. No oropharyngeal exudate.  Eyes: Conjunctivae and EOM are normal. Pupils are equal, round, and reactive to light. Right eye exhibits no discharge. Left eye exhibits no discharge. No scleral icterus.  Neck: Normal range of motion. Neck supple. No JVD present. No thyromegaly present.  Cardiovascular:  Normal rate, regular rhythm, normal heart sounds and intact distal pulses. Exam reveals no gallop and no friction rub.  No murmur heard. Pulmonary/Chest: Tachypnea noted. She is in respiratory distress. She has wheezes. She has no rales.  The patient is using some accessory muscles, she speaks in shortened sentences, she is tachypneic and has a prolonged expiratory phase.  Abdominal: Soft. Bowel sounds are normal. She exhibits no distension and no mass. There is no tenderness.  Musculoskeletal: Normal range of motion. She exhibits no edema or tenderness.  Lymphadenopathy:    She has no cervical adenopathy.  Neurological: She is alert. Coordination normal.  Skin: Skin is warm and dry. No rash noted. No erythema.  Psychiatric: She has a normal mood and affect. Her behavior is normal.  Nursing note and vitals reviewed.    ED Treatments / Results  Labs (all labs ordered are listed, but only abnormal results are displayed) Labs Reviewed  BASIC METABOLIC PANEL - Abnormal; Notable for the following components:      Result Value   BUN 21 (*)    GFR calc non Af Amer 55 (*)    All other components within normal limits  CBC - Abnormal; Notable for the following components:    Hemoglobin 11.1 (*)    HCT 35.7 (*)    All other components within normal limits  INFLUENZA PANEL BY PCR (TYPE A & B)    EKG  EKG Interpretation  Date/Time:  Monday February 02 2018 19:39:04 EST Ventricular Rate:  75 PR Interval:  162 QRS Duration: 80 QT Interval:  382 QTC Calculation: 426 R Axis:   12 Text Interpretation:  Normal sinus rhythm Normal ECG since last tracing no significant change Confirmed by Noemi Chapel [phone]) on 02/02/2018 7:59:27 PM       Radiology Dg Chest 2 View  Result Date: 02/02/2018 CLINICAL DATA:  77 year old female with cough and shortness of breath for 1 week. Smoker. EXAM: CHEST  2 VIEW COMPARISON:  12/25/2017 and earlier. FINDINGS: Upright AP and lateral views of the chest. Stable large lung volumes. Stable mediastinal scratched as stable cardiac and mediastinal contours. Visualized tracheal air column is within normal limits. No pneumothorax, pulmonary edema, pleural effusion or confluent pulmonary opacity. No acute osseous abnormality identified. Negative visible bowel gas pattern. IMPRESSION: Chronic pulmonary hyperinflation. No acute cardiopulmonary abnormality. Electronically Signed   By: Genevie Ann M.D.   On: 02/02/2018 20:49    Procedures .Critical Care Performed by: Noemi Chapel, MD Authorized by: Noemi Chapel, MD   Critical care provider statement:    Critical care time (minutes):  35   Critical care time was exclusive of:  Separately billable procedures and treating other patients and teaching time   Critical care was necessary to treat or prevent imminent or life-threatening deterioration of the following conditions:  Respiratory failure   Critical care was time spent personally by me on the following activities:  Blood draw for specimens, development of treatment plan with patient or surrogate, discussions with consultants, evaluation of patient's response to treatment, examination of patient, obtaining history from patient or surrogate,  ordering and performing treatments and interventions, ordering and review of laboratory studies, ordering and review of radiographic studies, pulse oximetry, re-evaluation of patient's condition and review of old charts   (including critical care time)  Medications Ordered in ED Medications  levofloxacin (LEVAQUIN) tablet 500 mg (not administered)  albuterol (PROVENTIL) (2.5 MG/3ML) 0.083% nebulizer solution 5 mg (5 mg Nebulization Given 02/02/18 1951)  methylPREDNISolone sodium  succinate (SOLU-MEDROL) 125 mg/2 mL injection 125 mg (125 mg Intravenous Given 02/02/18 2108)  ipratropium-albuterol (DUONEB) 0.5-2.5 (3) MG/3ML nebulizer solution 3 mL (3 mLs Nebulization Given 02/02/18 2013)     Initial Impression / Assessment and Plan / ED Course  I have reviewed the triage vital signs and the nursing notes.  Pertinent labs & imaging results that were available during my care of the patient were reviewed by me and considered in my medical decision making (see chart for details).    The patient does appear to be in respiratory distress, likely related to COPD exacerbation.  She denies any signs or symptoms of flulike illness, will need to rule out pneumonia, continuous nebulizer therapy, Solu-Medrol, test for flu given respiratory distress.  The patient received continuous nebulizer therapy, steroids, chest x-ray which was negative  I personally viewed the 2 view PA and lateral chest x-ray and I find no signs of infiltrate or pneumothorax.  Lab work showed normal metabolic panel as well as a normal CBC other than mild anemia.  The patient ambulated becoming hypoxic to 89% but more than that she became more dyspneic and this took quite some time to resolve and on repeat exam still has some increased work of breathing, retractions and signs of respiratory distress.  I have discussed this with the patient and she feels more comfortable being admitted as to why as I think she has ongoing respiratory  distress.  Patient is critically ill with acute respiratory distress and respiratory failure related to COPD however at this time after continuous nebulizer therapy she will need another nebulizer, she does not need BiPAP at this time and does not need intubation at this time.  Respiratory flora quinolone will be added for COPD exacerbation in the presence of increased cough and phlegm production  Paged hospitalist for admission.  Final Clinical Impressions(s) / ED Diagnoses   Final diagnoses:  Respiratory distress  COPD exacerbation ([place])      Noemi Chapel, MD 02/02/18 2158

## 2018-02-02 NOTE — ED Notes (Signed)
RT notified for neb tx. 

## 2018-02-03 ENCOUNTER — Encounter (HOSPITAL_COMMUNITY): Payer: Self-pay

## 2018-02-03 ENCOUNTER — Other Ambulatory Visit: Payer: Self-pay

## 2018-02-03 DIAGNOSIS — J449 Chronic obstructive pulmonary disease, unspecified: Secondary | ICD-10-CM | POA: Diagnosis present

## 2018-02-03 DIAGNOSIS — J441 Chronic obstructive pulmonary disease with (acute) exacerbation: Secondary | ICD-10-CM | POA: Diagnosis not present

## 2018-02-03 DIAGNOSIS — F17210 Nicotine dependence, cigarettes, uncomplicated: Secondary | ICD-10-CM | POA: Diagnosis not present

## 2018-02-03 DIAGNOSIS — Z79899 Other long term (current) drug therapy: Secondary | ICD-10-CM | POA: Diagnosis not present

## 2018-02-03 DIAGNOSIS — Z681 Body mass index (BMI) 19 or less, adult: Secondary | ICD-10-CM | POA: Diagnosis not present

## 2018-02-03 DIAGNOSIS — E46 Unspecified protein-calorie malnutrition: Secondary | ICD-10-CM | POA: Diagnosis not present

## 2018-02-03 DIAGNOSIS — Z7951 Long term (current) use of inhaled steroids: Secondary | ICD-10-CM | POA: Diagnosis not present

## 2018-02-03 DIAGNOSIS — Z66 Do not resuscitate: Secondary | ICD-10-CM | POA: Diagnosis not present

## 2018-02-03 DIAGNOSIS — I361 Nonrheumatic tricuspid (valve) insufficiency: Secondary | ICD-10-CM | POA: Diagnosis not present

## 2018-02-03 DIAGNOSIS — E440 Moderate protein-calorie malnutrition: Secondary | ICD-10-CM | POA: Diagnosis not present

## 2018-02-03 DIAGNOSIS — J9601 Acute respiratory failure with hypoxia: Secondary | ICD-10-CM | POA: Diagnosis not present

## 2018-02-03 LAB — COMPREHENSIVE METABOLIC PANEL
ALT: 17 U/L (ref 14–54)
ANION GAP: 15 (ref 5–15)
AST: 44 U/L — AB (ref 15–41)
Albumin: 3.7 g/dL (ref 3.5–5.0)
Alkaline Phosphatase: 69 U/L (ref 38–126)
BILIRUBIN TOTAL: 0.3 mg/dL (ref 0.3–1.2)
BUN: 16 mg/dL (ref 6–20)
CO2: 20 mmol/L — ABNORMAL LOW (ref 22–32)
Calcium: 9.1 mg/dL (ref 8.9–10.3)
Chloride: 102 mmol/L (ref 101–111)
Creatinine, Ser: 0.95 mg/dL (ref 0.44–1.00)
GFR calc Af Amer: >60 mL/min (ref >60)
GFR, EST NON AFRICAN AMERICAN: 58 mL/min — AB (ref >60)
Glucose, Bld: 197 mg/dL — ABNORMAL HIGH (ref 65–99)
POTASSIUM: 4 mmol/L (ref 3.5–5.1)
Sodium: 137 mmol/L (ref 135–145)
TOTAL PROTEIN: 6.4 g/dL — AB (ref 6.5–8.1)

## 2018-02-03 LAB — CBC
HEMATOCRIT: 33.5 % — AB (ref 36.0–46.0)
Hemoglobin: 10.7 g/dL — ABNORMAL LOW (ref 12.0–15.0)
MCH: 27 pg (ref 26.0–34.0)
MCHC: 31.9 g/dL (ref 30.0–36.0)
MCV: 84.4 fL (ref 78.0–100.0)
Platelets: 333 10*3/uL (ref 150–400)
RBC: 3.97 MIL/uL (ref 3.87–5.11)
RDW: 14.8 % (ref 11.5–15.5)
WBC: 7 10*3/uL (ref 4.0–10.5)

## 2018-02-03 LAB — INFLUENZA PANEL BY PCR (TYPE A & B)
Influenza A By PCR: NEGATIVE
Influenza B By PCR: NEGATIVE

## 2018-02-03 MED ORDER — IPRATROPIUM BROMIDE 0.02 % IN SOLN: 0.5000 mg | Freq: Four times a day (QID) | RESPIRATORY_TRACT | Status: DC

## 2018-02-03 MED ORDER — ORAL CARE MOUTH RINSE
15.0000 mL | Freq: Two times a day (BID) | OROMUCOSAL | Status: DC
  Administered 2018-02-04 – 2018-02-05 (×4): 15 mL via OROMUCOSAL

## 2018-02-03 MED ORDER — SODIUM CHLORIDE 0.9% FLUSH
3.0000 mL | Freq: Two times a day (BID) | INTRAVENOUS | Status: DC
  Administered 2018-02-03 – 2018-02-05 (×7): 3 mL via INTRAVENOUS

## 2018-02-03 MED ORDER — ALBUTEROL SULFATE (2.5 MG/3ML) 0.083% IN NEBU: 2.5000 mg | INHALATION_SOLUTION | Freq: Four times a day (QID) | RESPIRATORY_TRACT | Status: DC

## 2018-02-03 MED ORDER — METHYLPREDNISOLONE SODIUM SUCC 125 MG IJ SOLR
60.0000 mg | Freq: Four times a day (QID) | INTRAMUSCULAR | Status: DC
  Administered 2018-02-03 – 2018-02-06 (×13): 60 mg via INTRAVENOUS
  Filled 2018-02-03 (×13): qty 2

## 2018-02-03 MED ORDER — ENOXAPARIN SODIUM 40 MG/0.4ML ~~LOC~~ SOLN: 40.0000 mg | SUBCUTANEOUS | Status: DC

## 2018-02-03 MED ORDER — ENOXAPARIN SODIUM 30 MG/0.3ML ~~LOC~~ SOLN
30.0000 mg | SUBCUTANEOUS | Status: DC
  Administered 2018-02-03 – 2018-02-06 (×4): 30 mg via SUBCUTANEOUS
  Filled 2018-02-03 (×4): qty 0.3

## 2018-02-03 MED ORDER — SODIUM CHLORIDE 0.9% FLUSH: 3.0000 mL | INTRAVENOUS | Status: DC | PRN

## 2018-02-03 MED ORDER — SODIUM CHLORIDE 0.9 % IV SOLN: 250.0000 mL | INTRAVENOUS | Status: DC | PRN

## 2018-02-03 MED ORDER — IPRATROPIUM-ALBUTEROL 0.5-2.5 (3) MG/3ML IN SOLN
3.0000 mL | Freq: Four times a day (QID) | RESPIRATORY_TRACT | Status: DC
Start: 2018-02-03 — End: 2018-02-04
  Administered 2018-02-03 – 2018-02-04 (×5): 3 mL via RESPIRATORY_TRACT
  Filled 2018-02-03 (×4): qty 3

## 2018-02-03 MED ORDER — GUAIFENESIN ER 600 MG PO TB12
600.0000 mg | ORAL_TABLET | Freq: Two times a day (BID) | ORAL | Status: DC
  Administered 2018-02-03 – 2018-02-06 (×8): 600 mg via ORAL
  Filled 2018-02-03 (×8): qty 1

## 2018-02-03 NOTE — Progress Notes (Signed)
Pharmacy - lovenox dose adjustment  77 yr old female with current weight of 42.7 kg and CrCl ~ 33.6 ml/min.  Due to weight <45 kg will reduce lovenox to 30 mg SQ Q24 for DVT prophylaxis.  Katelyn [name] 02/03/2018

## 2018-02-03 NOTE — Progress Notes (Signed)
Subjective: She was admitted with COPD exacerbation.  She says she feels better.  She is still coughing mostly nonproductively.  She is still short of breath with minimal exertion  Objective: Vital signs in last 24 hours: Temp:  [97.8 F (36.6 C)-98.1 F (36.7 C)] 98.1 F (36.7 C) (02/12 0340) Pulse Rate:  [62-92] 85 (02/12 0340) Resp:  [17-26] 19 (02/12 0340) BP: (116-136)/(63-87) 128/70 (02/12 0340) SpO2:  [92 %-99 %] 96 % (02/12 0944) Weight:  [41.3 kg (91 lb)-42.7 kg (94 lb 2.2 oz)] 42.7 kg (94 lb 2.2 oz) (02/12 0116) Weight change:  Last BM Date: 02/02/18  Intake/Output from previous day: 02/11 0701 - 02/12 0700 In: 243 [P.O.:240; I.V.:3] Out: -   PHYSICAL EXAM General appearance: alert, cooperative, mild distress and Very thin Resp: Diminished breath sounds and markedly prolonged expiratory phase Cardio: regular rate and rhythm, S1, S2 normal, no murmur, click, rub or gallop GI: soft, non-tender; bowel sounds normal; no masses,  no organomegaly Extremities: extremities normal, atraumatic, no cyanosis or edema Skin turgor fair  Lab Results:  Results for orders placed or performed during the hospital encounter of 02/02/18 (from the past 48 hour(s))  Basic metabolic panel     Status: Abnormal   Collection Time: 02/02/18  7:35 PM  Result Value Ref Range   Sodium 140 135 - 145 mmol/L   Potassium 4.1 3.5 - 5.1 mmol/L   Chloride 103 101 - 111 mmol/L   CO2 27 22 - 32 mmol/L   Glucose, Bld 83 65 - 99 mg/dL   BUN 21 (H) 6 - 20 mg/dL   Creatinine, Ser 0.99 0.44 - 1.00 mg/dL   Calcium 9.2 8.9 - 10.3 mg/dL   GFR calc non Af Amer 55 (L) >60 mL/min   GFR calc Af Amer >60 >60 mL/min    Comment: (NOTE) The eGFR has been calculated using the CKD EPI equation. This calculation has not been validated in all clinical situations. eGFR's persistently <60 mL/min signify possible Chronic Kidney Disease.    Anion gap 10 5 - 15    Comment: Performed at [facility], [address] [place], [place] [address]  CBC     Status: Abnormal   Collection Time: 02/02/18  7:35 PM  Result Value Ref Range   WBC 9.0 4.0 - 10.5 K/uL   RBC 4.19 3.87 - 5.11 MIL/uL   Hemoglobin 11.1 (L) 12.0 - 15.0 g/dL   HCT 35.7 (L) 36.0 - 46.0 %   MCV 85.2 78.0 - 100.0 fL   MCH 26.5 26.0 - 34.0 pg   MCHC 31.1 30.0 - 36.0 g/dL   RDW 15.0 11.5 - 15.5 %   Platelets 371 150 - 400 K/uL    Comment: Performed at [facility], [address], [place], [place] [address]  Influenza panel by PCR (type A & B)     Status: None   Collection Time: 02/02/18 11:56 PM  Result Value Ref Range   Influenza A By PCR NEGATIVE NEGATIVE   Influenza B By PCR NEGATIVE NEGATIVE    Comment: (NOTE) The Xpert Xpress Flu assay is intended as an aid in the diagnosis of  influenza and should not be used as a sole basis for treatment.  This  assay is FDA approved for nasopharyngeal swab specimens only. Nasal  washings and aspirates are unacceptable for Xpert Xpress Flu testing. Performed at [facility], [address], [place], [place] [address]   CBC     Status: Abnormal  Collection Time: 02/03/18  5:24 AM  Result Value Ref Range   WBC 7.0 4.0 - 10.5 K/uL   RBC 3.97 3.87 - 5.11 MIL/uL   Hemoglobin 10.7 (L) 12.0 - 15.0 g/dL   HCT 33.5 (L) 36.0 - 46.0 %   MCV 84.4 78.0 - 100.0 fL   MCH 27.0 26.0 - 34.0 pg   MCHC 31.9 30.0 - 36.0 g/dL   RDW 14.8 11.5 - 15.5 %   Platelets 333 150 - 400 K/uL    Comment: Performed at [facility], [address], [place], [place] [address]  Comprehensive metabolic panel     Status: Abnormal   Collection Time: 02/03/18  5:24 AM  Result Value Ref Range   Sodium 137 135 - 145 mmol/L   Potassium 4.0 3.5 - 5.1 mmol/L   Chloride 102 101 - 111 mmol/L   CO2 20 (L) 22 - 32 mmol/L   Glucose, Bld 197 (H) 65 - 99 mg/dL   BUN 16 6 - 20 mg/dL   Creatinine, Ser 0.95 0.44 - 1.00 mg/dL   Calcium 9.1 8.9 - 10.3 mg/dL   Total Protein 6.4 (L) 6.5 - 8.1 g/dL   Albumin 3.7 3.5 - 5.0 g/dL   AST 44 (H)  15 - 41 U/L   ALT 17 14 - 54 U/L   Alkaline Phosphatase 69 38 - 126 U/L   Total Bilirubin 0.3 0.3 - 1.2 mg/dL   GFR calc non Af Amer 58 (L) >60 mL/min   GFR calc Af Amer >60 >60 mL/min    Comment: (NOTE) The eGFR has been calculated using the CKD EPI equation. This calculation has not been validated in all clinical situations. eGFR's persistently <60 mL/min signify possible Chronic Kidney Disease.    Anion gap 15 5 - 15    Comment: Performed at [facility], [address] [place], [place] [address]    ABGS No results for input(s): PHART, PO2ART, TCO2, HCO3 in the last 72 hours.  Invalid input(s): PCO2 CULTURES No results found for this or any previous visit (from the past 240 hour(s)). Studies/Results: Dg Chest 2 View  Result Date: 02/02/2018 CLINICAL DATA:  77 year old female with cough and shortness of breath for 1 week. Smoker. EXAM: CHEST  2 VIEW COMPARISON:  12/25/2017 and earlier. FINDINGS: Upright AP and lateral views of the chest. Stable large lung volumes. Stable mediastinal scratched as stable cardiac and mediastinal contours. Visualized tracheal air column is within normal limits. No pneumothorax, pulmonary edema, pleural effusion or confluent pulmonary opacity. No acute osseous abnormality identified. Negative visible bowel gas pattern. IMPRESSION: Chronic pulmonary hyperinflation. No acute cardiopulmonary abnormality. Electronically Signed   By: Genevie Ann M.D.   On: 02/02/2018 20:49    Medications:  Prior to Admission:  Medications Prior to Admission  Medication Sig Dispense Refill Last Dose  . albuterol (PROVENTIL HFA;VENTOLIN HFA) 108 (90 BASE) MCG/ACT inhaler Inhale 2 puffs into the lungs every 6 (six) hours as needed for wheezing or shortness of breath. 1 Inhaler 1 02/02/2018 at Unknown time  . albuterol (PROVENTIL) (2.5 MG/3ML) 0.083% nebulizer solution Take 3 mLs (2.5 mg total) by nebulization every 4 (four) hours as needed for wheezing or shortness of breath. 45 mL  1 02/02/2018 at Unknown time  . fluticasone furoate-vilanterol (BREO ELLIPTA) 100-25 MCG/INH AEPB Inhale 1 puff into the lungs daily.   02/02/2018 at Unknown time  . pseudoephedrine (SUDAFED) 30 MG tablet Take 30 mg by mouth every 4 (four) hours as needed for congestion.  02/02/2018 at Unknown time  . dexamethasone (DECADRON) 4 MG tablet Take 1 tablet (4 mg total) by mouth 2 (two) times daily with a meal. (Patient not taking: Reported on 02/02/2018) 10 tablet 0 Not Taking at Unknown time  . lidocaine (LIDODERM) 5 % Place 1 patch onto the skin daily. Remove & Discard patch within 12 hours or as directed by MD (Patient not taking: Reported on 02/02/2018) 30 patch 0 Not Taking at Unknown time  . naproxen (NAPROSYN) 500 MG tablet Take 1 tablet (500 mg total) by mouth 2 (two) times daily. (Patient not taking: Reported on 02/02/2018) 30 tablet 0 Not Taking at Unknown time  . traMADol (ULTRAM) 50 MG tablet Take 1 tablet (50 mg total) by mouth every 6 (six) hours as needed. (Patient not taking: Reported on 02/02/2018) 12 tablet 0 Not Taking at Unknown time   Scheduled: . enoxaparin (LOVENOX) injection  30 mg Subcutaneous Q24H  . guaiFENesin  600 mg Oral BID  . ipratropium-albuterol  3 mL Nebulization Q6H  . [START ON 02/04/2018] mouth rinse  15 mL Mouth Rinse BID  . methylPREDNISolone (SOLU-MEDROL) injection  60 mg Intravenous Q6H  . sodium chloride flush  3 mL Intravenous Q12H   Continuous: . sodium chloride     JHE:RDEYCX chloride, sodium chloride flush  Assesment: She was admitted with COPD exacerbation and acute hypoxic respiratory failure.  She is better but not clear. Active Problems:   COPD exacerbation ([place])    Plan: Continue steroids inhaled bronchodilators.  Add flutter valve.    LOS: 0 days   Vittoria Noreen L 02/03/2018, 9:54 AM

## 2018-02-04 MED ORDER — IPRATROPIUM-ALBUTEROL 0.5-2.5 (3) MG/3ML IN SOLN
3.0000 mL | Freq: Three times a day (TID) | RESPIRATORY_TRACT | Status: DC
  Administered 2018-02-04 – 2018-02-06 (×6): 3 mL via RESPIRATORY_TRACT
  Filled 2018-02-04 (×5): qty 3

## 2018-02-04 NOTE — Progress Notes (Signed)
Subjective: She has COPD exacerbation.  She is doing better.  She is using a flutter valve and is coughing up some sputum.  She is short of breath still with minimal exertion.  Objective: Vital signs in last 24 hours: Temp:  [98 F (36.7 C)-98.3 F (36.8 C)] 98 F (36.7 C) (02/13 0353) Pulse Rate:  [83-107] 83 (02/13 0353) Resp:  [18-20] 18 (02/13 0353) BP: (102-117)/(54-62) 102/56 (02/13 0353) SpO2:  [95 %-99 %] 95 % (02/13 0730) Weight change:  Last BM Date: 02/02/18  Intake/Output from previous day: 02/12 0701 - 02/13 0700 In: 1120 [P.O.:1110; I.V.:10] Out: -   PHYSICAL EXAM General appearance: alert, cooperative and mild distress Resp: Markedly diminished breath sounds Cardio: regular rate and rhythm, S1, S2 normal, no murmur, click, rub or gallop GI: soft, non-tender; bowel sounds normal; no masses,  no organomegaly Extremities: extremities normal, atraumatic, no cyanosis or edema She is very thin  Lab Results:  Results for orders placed or performed during the hospital encounter of 02/02/18 (from the past 48 hour(s))  Basic metabolic panel     Status: Abnormal   Collection Time: 02/02/18  7:35 PM  Result Value Ref Range   Sodium 140 135 - 145 mmol/L   Potassium 4.1 3.5 - 5.1 mmol/L   Chloride 103 101 - 111 mmol/L   CO2 27 22 - 32 mmol/L   Glucose, Bld 83 65 - 99 mg/dL   BUN 21 (H) 6 - 20 mg/dL   Creatinine, Ser 0.99 0.44 - 1.00 mg/dL   Calcium 9.2 8.9 - 10.3 mg/dL   GFR calc non Af Amer 55 (L) >60 mL/min   GFR calc Af Amer >60 >60 mL/min    Comment: (NOTE) The eGFR has been calculated using the CKD EPI equation. This calculation has not been validated in all clinical situations. eGFR's persistently <60 mL/min signify possible Chronic Kidney Disease.    Anion gap 10 5 - 15    Comment: Performed at [facility], [address], [place], [place] [address]  CBC     Status: Abnormal   Collection Time: 02/02/18  7:35 PM  Result Value Ref Range   WBC 9.0 4.0 -  10.5 K/uL   RBC 4.19 3.87 - 5.11 MIL/uL   Hemoglobin 11.1 (L) 12.0 - 15.0 g/dL   HCT 35.7 (L) 36.0 - 46.0 %   MCV 85.2 78.0 - 100.0 fL   MCH 26.5 26.0 - 34.0 pg   MCHC 31.1 30.0 - 36.0 g/dL   RDW 15.0 11.5 - 15.5 %   Platelets 371 150 - 400 K/uL    Comment: Performed at [facility], [address], [place], [place] [address]  Influenza panel by PCR (type A & B)     Status: None   Collection Time: 02/02/18 11:56 PM  Result Value Ref Range   Influenza A By PCR NEGATIVE NEGATIVE   Influenza B By PCR NEGATIVE NEGATIVE    Comment: (NOTE) The Xpert Xpress Flu assay is intended as an aid in the diagnosis of  influenza and should not be used as a sole basis for treatment.  This  assay is FDA approved for nasopharyngeal swab specimens only. Nasal  washings and aspirates are unacceptable for Xpert Xpress Flu testing. Performed at [facility], [address], [place], [place] [address]   CBC     Status: Abnormal   Collection Time: 02/03/18  5:24 AM  Result Value Ref Range   WBC 7.0 4.0 - 10.5 K/uL  RBC 3.97 3.87 - 5.11 MIL/uL   Hemoglobin 10.7 (L) 12.0 - 15.0 g/dL   HCT 33.5 (L) 36.0 - 46.0 %   MCV 84.4 78.0 - 100.0 fL   MCH 27.0 26.0 - 34.0 pg   MCHC 31.9 30.0 - 36.0 g/dL   RDW 14.8 11.5 - 15.5 %   Platelets 333 150 - 400 K/uL    Comment: Performed at [facility], [address] [place], [place] [address]  Comprehensive metabolic panel     Status: Abnormal   Collection Time: 02/03/18  5:24 AM  Result Value Ref Range   Sodium 137 135 - 145 mmol/L   Potassium 4.0 3.5 - 5.1 mmol/L   Chloride 102 101 - 111 mmol/L   CO2 20 (L) 22 - 32 mmol/L   Glucose, Bld 197 (H) 65 - 99 mg/dL   BUN 16 6 - 20 mg/dL   Creatinine, Ser 0.95 0.44 - 1.00 mg/dL   Calcium 9.1 8.9 - 10.3 mg/dL   Total Protein 6.4 (L) 6.5 - 8.1 g/dL   Albumin 3.7 3.5 - 5.0 g/dL   AST 44 (H) 15 - 41 U/L   ALT 17 14 - 54 U/L   Alkaline Phosphatase 69 38 - 126 U/L   Total Bilirubin 0.3 0.3 - 1.2 mg/dL   GFR calc non Af  Amer 58 (L) >60 mL/min   GFR calc Af Amer >60 >60 mL/min    Comment: (NOTE) The eGFR has been calculated using the CKD EPI equation. This calculation has not been validated in all clinical situations. eGFR's persistently <60 mL/min signify possible Chronic Kidney Disease.    Anion gap 15 5 - 15    Comment: Performed at [facility], [address], [place], [place] [address]    ABGS No results for input(s): PHART, PO2ART, TCO2, HCO3 in the last 72 hours.  Invalid input(s): PCO2 CULTURES No results found for this or any previous visit (from the past 240 hour(s)). Studies/Results: Dg Chest 2 View  Result Date: 02/02/2018 CLINICAL DATA:  77 year old female with cough and shortness of breath for 1 week. Smoker. EXAM: CHEST  2 VIEW COMPARISON:  12/25/2017 and earlier. FINDINGS: Upright AP and lateral views of the chest. Stable large lung volumes. Stable mediastinal scratched as stable cardiac and mediastinal contours. Visualized tracheal air column is within normal limits. No pneumothorax, pulmonary edema, pleural effusion or confluent pulmonary opacity. No acute osseous abnormality identified. Negative visible bowel gas pattern. IMPRESSION: Chronic pulmonary hyperinflation. No acute cardiopulmonary abnormality. Electronically Signed   By: Genevie Ann M.D.   On: 02/02/2018 20:49    Medications:  Prior to Admission:  Medications Prior to Admission  Medication Sig Dispense Refill Last Dose  . albuterol (PROVENTIL HFA;VENTOLIN HFA) 108 (90 BASE) MCG/ACT inhaler Inhale 2 puffs into the lungs every 6 (six) hours as needed for wheezing or shortness of breath. 1 Inhaler 1 02/02/2018 at Unknown time  . albuterol (PROVENTIL) (2.5 MG/3ML) 0.083% nebulizer solution Take 3 mLs (2.5 mg total) by nebulization every 4 (four) hours as needed for wheezing or shortness of breath. 45 mL 1 02/02/2018 at Unknown time  . fluticasone furoate-vilanterol (BREO ELLIPTA) 100-25 MCG/INH AEPB Inhale 1 puff into the lungs  daily.   02/02/2018 at Unknown time  . pseudoephedrine (SUDAFED) 30 MG tablet Take 30 mg by mouth every 4 (four) hours as needed for congestion.   02/02/2018 at Unknown time  . dexamethasone (DECADRON) 4 MG tablet Take 1 tablet (4 mg total) by mouth  2 (two) times daily with a meal. (Patient not taking: Reported on 02/02/2018) 10 tablet 0 Not Taking at Unknown time  . lidocaine (LIDODERM) 5 % Place 1 patch onto the skin daily. Remove & Discard patch within 12 hours or as directed by MD (Patient not taking: Reported on 02/02/2018) 30 patch 0 Not Taking at Unknown time  . naproxen (NAPROSYN) 500 MG tablet Take 1 tablet (500 mg total) by mouth 2 (two) times daily. (Patient not taking: Reported on 02/02/2018) 30 tablet 0 Not Taking at Unknown time  . traMADol (ULTRAM) 50 MG tablet Take 1 tablet (50 mg total) by mouth every 6 (six) hours as needed. (Patient not taking: Reported on 02/02/2018) 12 tablet 0 Not Taking at Unknown time   Scheduled: . enoxaparin (LOVENOX) injection  30 mg Subcutaneous Q24H  . guaiFENesin  600 mg Oral BID  . ipratropium-albuterol  3 mL Nebulization Q6H  . mouth rinse  15 mL Mouth Rinse BID  . methylPREDNISolone (SOLU-MEDROL) injection  60 mg Intravenous Q6H  . sodium chloride flush  3 mL Intravenous Q12H   Continuous: . sodium chloride     HUD:JSHFWY chloride, sodium chloride flush  Assesment: She was admitted with COPD exacerbation.  She is getting better but slowly.  She has malnutrition and has been eating a little better.  She has ongoing nicotine abuse and has hopes to stop smoking. Active Problems:   COPD exacerbation (HCC)   COPD (chronic obstructive pulmonary disease) ([place])    Plan: Continue current treatments ambulate in hall possible discharge tomorrow    LOS: 1 day   Charmon Thorson L 02/04/2018, 8:34 AM

## 2018-02-04 NOTE — Care Management Note (Addendum)
Case Management Note  Patient Details  Name: SUMNER BOESCH MRN: [id number] Date of Birth: 08/16/1941  Subjective/Objective:      Admitted for COPD exacerbation. Pt is from home, lives with son and grandson. Pt ind with ADL's, has no HH or DME needs pta. Drives and is not homebound. Pt would meet criteria for COPD pilot with [facility] if she were homebound. Pt has neb machine pta. She has been ambulating ind with hallway, currently on room air. Pt is on [facility] registry, has had 2 ED visits in the past 6 months.                Action/Plan: DC home with self care. CM will refer for [facility] transition calls for COPD.   Expected Discharge Date:    02/05/2018              Expected Discharge Plan:  Home/Self Care  In-House Referral:  NA  Discharge planning Services  CM Consult  Post Acute Care Choice:  NA Choice offered to:  NA  Status of Service:  Completed, signed off  Sherald Barge, RN 02/04/2018, 11:55 AM

## 2018-02-05 ENCOUNTER — Inpatient Hospital Stay (HOSPITAL_COMMUNITY): Payer: Medicare Other

## 2018-02-05 DIAGNOSIS — I361 Nonrheumatic tricuspid (valve) insufficiency: Secondary | ICD-10-CM

## 2018-02-05 LAB — TROPONIN I
Troponin I: <0.03 ng/mL (ref <0.03)
Troponin I: <0.03 ng/mL (ref <0.03)

## 2018-02-05 LAB — ECHOCARDIOGRAM COMPLETE
HEIGHTINCHES: 64 in
WEIGHTICAEL: 1506.18 [oz_av]

## 2018-02-05 MED ORDER — LEVOFLOXACIN 500 MG PO TABS: 500.0000 mg | ORAL_TABLET | Freq: Every day | ORAL | 0 refills | Status: AC

## 2018-02-05 MED ORDER — PREDNISONE 10 MG (21) PO TBPK: ORAL_TABLET | ORAL | 1 refills | Status: DC

## 2018-02-05 NOTE — Progress Notes (Signed)
*  PRELIMINARY RESULTS* Echocardiogram 2D Echocardiogram has been performed.  Katelyn [name] 02/05/2018, 3:22 PM

## 2018-02-05 NOTE — Discharge Summary (Addendum)
Physician Discharge Summary  Patient ID: Katelyn [name] MRN: [id number] DOB/AGE: June 07, 1941 77 y.o. Primary Care Physician:Maceo Hernan, Ramon Dredge, MD Admit date: 02/02/2018 Discharge date: 02/05/2018    Discharge Diagnoses:   Active Problems:   COPD exacerbation (HCC)   COPD (chronic obstructive pulmonary disease) (HCC)   Allergies as of 02/05/2018   No Known Allergies     Medication List    TAKE these medications   albuterol (2.5 MG/3ML) 0.083% nebulizer solution Commonly known as:  PROVENTIL Take 3 mLs (2.5 mg total) by nebulization every 4 (four) hours as needed for wheezing or shortness of breath.   albuterol 108 (90 Base) MCG/ACT inhaler Commonly known as:  PROVENTIL HFA;VENTOLIN HFA Inhale 2 puffs into the lungs every 6 (six) hours as needed for wheezing or shortness of breath.   BREO ELLIPTA 100-25 MCG/INH Aepb Generic drug:  fluticasone furoate-vilanterol Inhale 1 puff into the lungs daily.   dexamethasone 4 MG tablet Commonly known as:  DECADRON Take 1 tablet (4 mg total) by mouth 2 (two) times daily with a meal.   levofloxacin 500 MG tablet Commonly known as:  LEVAQUIN Take 1 tablet (500 mg total) by mouth daily for 7 days.   lidocaine 5 % Commonly known as:  LIDODERM Place 1 patch onto the skin daily. Remove & Discard patch within 12 hours or as directed by MD   naproxen 500 MG tablet Commonly known as:  NAPROSYN Take 1 tablet (500 mg total) by mouth 2 (two) times daily.   predniSONE 10 MG (21) Tbpk tablet Commonly known as:  STERAPRED UNI-PAK 21 TAB Take by package directions   pseudoephedrine 30 MG tablet Commonly known as:  SUDAFED Take 30 mg by mouth every 4 (four) hours as needed for congestion.   traMADol 50 MG tablet Commonly known as:  ULTRAM Take 1 tablet (50 mg total) by mouth every 6 (six) hours as needed.       Discharged Condition: Improved    Consults: None  Significant Diagnostic Studies: Dg Chest 2 View  Result Date:  02/02/2018 CLINICAL DATA:  77 year old female with cough and shortness of breath for 1 week. Smoker. EXAM: CHEST  2 VIEW COMPARISON:  12/25/2017 and earlier. FINDINGS: Upright AP and lateral views of the chest. Stable large lung volumes. Stable mediastinal scratched as stable cardiac and mediastinal contours. Visualized tracheal air column is within normal limits. No pneumothorax, pulmonary edema, pleural effusion or confluent pulmonary opacity. No acute osseous abnormality identified. Negative visible bowel gas pattern. IMPRESSION: Chronic pulmonary hyperinflation. No acute cardiopulmonary abnormality. Electronically Signed   By: Odessa Fleming M.D.   On: 02/02/2018 20:49    Lab Results: Basic Metabolic Panel: Recent Labs    02/02/18 1935 02/03/18 0524  NA 140 137  K 4.1 4.0  CL 103 102  CO2 27 20*  GLUCOSE 83 197*  BUN 21* 16  CREATININE 0.99 0.95  CALCIUM 9.2 9.1   Liver Function Tests: Recent Labs    02/03/18 0524  AST 44*  ALT 17  ALKPHOS 69  BILITOT 0.3  PROT 6.4*  ALBUMIN 3.7     CBC: Recent Labs    02/02/18 1935 02/03/18 0524  WBC 9.0 7.0  HGB 11.1* 10.7*  HCT 35.7* 33.5*  MCV 85.2 84.4  PLT 371 333    No results found for this or any previous visit (from the past 240 hour(s)).   Hospital Course: This is a 77 year old who came to the emergency department with increasing shortness of breath.  She is known to have severe COPD and she is continuing to smoke.  She is been coughing some.  She was treated but not improved enough to release so she was admitted and started on IV steroids.  Inhaled bronchodilators and oxygen.  She improved over the next 48 hours was able to ambulate in the halls without any difficulty her lungs were clear and she was ready for discharge.  After discharge was arranged and after I dictated my discharge summary she developed chest discomfort and discharge was canceled.  She had echocardiogram, EKG and troponins all of which were essentially  nonrevealing.  She is ready for discharge now  Discharge Exam: Blood pressure 102/64, pulse 83, temperature 97.6 F (36.4 C), temperature source Oral, resp. rate 16, height 5\' 4"  (1.626 m), weight 42.7 kg (94 lb 2.2 oz), SpO2 92 %. She is awake and alert.  She is very thin.  Her chest shows diminished breath sounds  Disposition: Home she is not homebound so she is not a candidate for our new COPD pilot program.  She is going to try to stop smoking  Discharge Instructions    Diet - low sodium heart healthy   Complete by:  As directed    Increase activity slowly   Complete by:  As directed         Signed: Lynnwood Beckford L   02/05/2018, 8:54 AM

## 2018-02-05 NOTE — Progress Notes (Signed)
Subjective: She says she feels okay.  She has been walking the halls without any difficulty.  Objective: Vital signs in last 24 hours: Temp:  [97.6 F (36.4 C)-98.3 F (36.8 C)] 97.6 F (36.4 C) (02/14 0500) Pulse Rate:  [83-89] 83 (02/14 0500) Resp:  [16-20] 16 (02/14 0500) BP: (102-113)/(49-64) 102/64 (02/14 0500) SpO2:  [92 %-96 %] 92 % (02/14 0740) Weight change:  Last BM Date: 02/04/18  Intake/Output from previous day: 02/13 0701 - 02/14 0700 In: 1080 [P.O.:1080] Out: 500 [Urine:500]  PHYSICAL EXAM General appearance: alert and cooperative Resp: clear to auscultation bilaterally Cardio: regular rate and rhythm, S1, S2 normal, no murmur, click, rub or gallop GI: soft, non-tender; bowel sounds normal; no masses,  no organomegaly Extremities: extremities normal, atraumatic, no cyanosis or edema Very thin  Lab Results:  No results found for this or any previous visit (from the past 48 hour(s)).  ABGS No results for input(s): PHART, PO2ART, TCO2, HCO3 in the last 72 hours.  Invalid input(s): PCO2 CULTURES No results found for this or any previous visit (from the past 240 hour(s)). Studies/Results: No results found.  Medications:  Prior to Admission:  Medications Prior to Admission  Medication Sig Dispense Refill Last Dose  . albuterol (PROVENTIL HFA;VENTOLIN HFA) 108 (90 BASE) MCG/ACT inhaler Inhale 2 puffs into the lungs every 6 (six) hours as needed for wheezing or shortness of breath. 1 Inhaler 1 02/02/2018 at Unknown time  . albuterol (PROVENTIL) (2.5 MG/3ML) 0.083% nebulizer solution Take 3 mLs (2.5 mg total) by nebulization every 4 (four) hours as needed for wheezing or shortness of breath. 45 mL 1 02/02/2018 at Unknown time  . fluticasone furoate-vilanterol (BREO ELLIPTA) 100-25 MCG/INH AEPB Inhale 1 puff into the lungs daily.   02/02/2018 at Unknown time  . pseudoephedrine (SUDAFED) 30 MG tablet Take 30 mg by mouth every 4 (four) hours as needed for congestion.    02/02/2018 at Unknown time  . dexamethasone (DECADRON) 4 MG tablet Take 1 tablet (4 mg total) by mouth 2 (two) times daily with a meal. (Patient not taking: Reported on 02/02/2018) 10 tablet 0 Not Taking at Unknown time  . lidocaine (LIDODERM) 5 % Place 1 patch onto the skin daily. Remove & Discard patch within 12 hours or as directed by MD (Patient not taking: Reported on 02/02/2018) 30 patch 0 Not Taking at Unknown time  . naproxen (NAPROSYN) 500 MG tablet Take 1 tablet (500 mg total) by mouth 2 (two) times daily. (Patient not taking: Reported on 02/02/2018) 30 tablet 0 Not Taking at Unknown time  . traMADol (ULTRAM) 50 MG tablet Take 1 tablet (50 mg total) by mouth every 6 (six) hours as needed. (Patient not taking: Reported on 02/02/2018) 12 tablet 0 Not Taking at Unknown time   Scheduled: . enoxaparin (LOVENOX) injection  30 mg Subcutaneous Q24H  . guaiFENesin  600 mg Oral BID  . ipratropium-albuterol  3 mL Nebulization TID  . mouth rinse  15 mL Mouth Rinse BID  . methylPREDNISolone (SOLU-MEDROL) injection  60 mg Intravenous Q6H  . sodium chloride flush  3 mL Intravenous Q12H   Continuous: . sodium chloride     BOF:BPZWCH chloride, sodium chloride flush  Assesment: She was admitted with COPD exacerbation and she is substantially better.  I think she is ready for discharge. Active Problems:   COPD exacerbation (HCC)   COPD (chronic obstructive pulmonary disease) ([place])    Plan: Discharge home today.  I do not think she is homebound.  LOS: 2 days   Katelyn [name] 02/05/2018, 8:50 AM

## 2018-02-05 NOTE — Progress Notes (Signed)
RN called to room. Pt c/o chest pressure, nonradiating, 4/10, constant. Pt states "I've had these episodes at home before." VSS, see flowsheets. Dr. Luan Pulling paged and made aware. New orders received. Discharge cancelled at this time.

## 2018-02-06 MED ORDER — PREDNISONE 20 MG PO TABS
40.0000 mg | ORAL_TABLET | Freq: Every day | ORAL | Status: DC
  Administered 2018-02-06: 40 mg via ORAL
  Filled 2018-02-06: qty 2

## 2018-02-06 MED ORDER — LEVOFLOXACIN 500 MG PO TABS
500.0000 mg | ORAL_TABLET | Freq: Every day | ORAL | Status: DC
  Administered 2018-02-06: 500 mg via ORAL
  Filled 2018-02-06: qty 1

## 2018-02-06 NOTE — Care Management Important Message (Signed)
Important Message  Patient Details  Name: Katelyn [name] MRN: [id number] Date of Birth: 1941/12/14   Medicare Important Message Given:  Yes    Sherald Barge, RN 02/06/2018, 8:59 AM

## 2018-02-06 NOTE — Progress Notes (Signed)
Subjective: She had been set up for discharge yesterday but before she left she developed chest discomfort.  This was atypical but she does have risk factors for cardiac disease so troponins were cycled but were negative she had EKG that did not show any acute changes and she had echocardiogram that is essentially normal.  She is not having any more chest pain.  She is ready for discharge  Objective: Vital signs in last 24 hours: Temp:  [97.9 F (36.6 C)-98.1 F (36.7 C)] 97.9 F (36.6 C) (02/15 0227) Pulse Rate:  [79-112] 79 (02/15 0227) Resp:  [16-20] 20 (02/15 0227) BP: (106-116)/(44-63) 106/44 (02/15 0227) SpO2:  [94 %-95 %] 94 % (02/15 0745) Weight change:  Last BM Date: 02/05/18  Intake/Output from previous day: No intake/output data recorded.  PHYSICAL EXAM General appearance: alert, cooperative and no distress Resp: clear to auscultation bilaterally Cardio: regular rate and rhythm, S1, S2 normal, no murmur, click, rub or gallop GI: soft, non-tender; bowel sounds normal; no masses,  no organomegaly Extremities: extremities normal, atraumatic, no cyanosis or edema She is very thin  Lab Results:  Results for orders placed or performed during the hospital encounter of 02/02/18 (from the past 48 hour(s))  Troponin I (q 6hr x 3)     Status: None   Collection Time: 02/05/18 10:02 AM  Result Value Ref Range   Troponin I <0.03 <0.03 ng/mL    Comment: Performed at [facility], [address], [place], [place] [address]  Troponin I (q 6hr x 3)     Status: None   Collection Time: 02/05/18  3:44 PM  Result Value Ref Range   Troponin I <0.03 <0.03 ng/mL    Comment: Performed at [facility], [address] [place], [place] [address]  Troponin I (q 6hr x 3)     Status: None   Collection Time: 02/05/18  9:00 PM  Result Value Ref Range   Troponin I <0.03 <0.03 ng/mL    Comment: Performed at [facility], [address] [place], [place] [address]    ABGS No results for input(s):  PHART, PO2ART, TCO2, HCO3 in the last 72 hours.  Invalid input(s): PCO2 CULTURES No results found for this or any previous visit (from the past 240 hour(s)). Studies/Results: No results found.  Medications:  Prior to Admission:  Medications Prior to Admission  Medication Sig Dispense Refill Last Dose  . albuterol (PROVENTIL HFA;VENTOLIN HFA) 108 (90 BASE) MCG/ACT inhaler Inhale 2 puffs into the lungs every 6 (six) hours as needed for wheezing or shortness of breath. 1 Inhaler 1 02/02/2018 at Unknown time  . albuterol (PROVENTIL) (2.5 MG/3ML) 0.083% nebulizer solution Take 3 mLs (2.5 mg total) by nebulization every 4 (four) hours as needed for wheezing or shortness of breath. 45 mL 1 02/02/2018 at Unknown time  . fluticasone furoate-vilanterol (BREO ELLIPTA) 100-25 MCG/INH AEPB Inhale 1 puff into the lungs daily.   02/02/2018 at Unknown time  . pseudoephedrine (SUDAFED) 30 MG tablet Take 30 mg by mouth every 4 (four) hours as needed for congestion.   02/02/2018 at Unknown time  . dexamethasone (DECADRON) 4 MG tablet Take 1 tablet (4 mg total) by mouth 2 (two) times daily with a meal. (Patient not taking: Reported on 02/02/2018) 10 tablet 0 Not Taking at Unknown time  . lidocaine (LIDODERM) 5 % Place 1 patch onto the skin daily. Remove & Discard patch within 12 hours or as directed by MD (Patient not taking: Reported on 02/02/2018) 30 patch  0 Not Taking at Unknown time  . naproxen (NAPROSYN) 500 MG tablet Take 1 tablet (500 mg total) by mouth 2 (two) times daily. (Patient not taking: Reported on 02/02/2018) 30 tablet 0 Not Taking at Unknown time  . traMADol (ULTRAM) 50 MG tablet Take 1 tablet (50 mg total) by mouth every 6 (six) hours as needed. (Patient not taking: Reported on 02/02/2018) 12 tablet 0 Not Taking at Unknown time   Scheduled: . enoxaparin (LOVENOX) injection  30 mg Subcutaneous Q24H  . guaiFENesin  600 mg Oral BID  . ipratropium-albuterol  3 mL Nebulization TID  . mouth rinse  15 mL  Mouth Rinse BID  . methylPREDNISolone (SOLU-MEDROL) injection  60 mg Intravenous Q6H  . sodium chloride flush  3 mL Intravenous Q12H   Continuous: . sodium chloride     WLS:LHTDSK chloride, sodium chloride flush  Assesment: She was admitted with COPD exacerbation.  She had acute hypoxic respiratory failure which has resolved.  She had chest pain which has resolved.  The cardiac workup is negative. Active Problems:   COPD exacerbation (HCC)   COPD (chronic obstructive pulmonary disease) ([place])    Plan: Discharge home today    LOS: 3 days   Amariyon Maynes L 02/06/2018, 8:44 AM

## 2018-02-06 NOTE — Progress Notes (Signed)
IV discontinued,catheter intact. Patient discharged with instructions given on medications,and follow up visits,patient verbalized understanding. Prescriptions sent to Pharmacy of choice documented on AVS. Accompanied by staff to an awaiting vehicle. 

## 2018-02-16 DIAGNOSIS — J441 Chronic obstructive pulmonary disease with (acute) exacerbation: Secondary | ICD-10-CM | POA: Diagnosis not present

## 2018-02-16 DIAGNOSIS — J301 Allergic rhinitis due to pollen: Secondary | ICD-10-CM | POA: Diagnosis not present

## 2018-02-16 DIAGNOSIS — E46 Unspecified protein-calorie malnutrition: Secondary | ICD-10-CM | POA: Diagnosis not present

## 2018-02-16 DIAGNOSIS — F17200 Nicotine dependence, unspecified, uncomplicated: Secondary | ICD-10-CM | POA: Diagnosis not present

## 2018-03-13 ENCOUNTER — Other Ambulatory Visit (HOSPITAL_COMMUNITY): Payer: Self-pay | Admitting: Pulmonary Disease

## 2018-03-13 DIAGNOSIS — Z1231 Encounter for screening mammogram for malignant neoplasm of breast: Secondary | ICD-10-CM

## 2018-03-20 ENCOUNTER — Ambulatory Visit (HOSPITAL_COMMUNITY): Payer: Medicare Other

## 2018-03-25 DIAGNOSIS — J449 Chronic obstructive pulmonary disease, unspecified: Secondary | ICD-10-CM | POA: Diagnosis not present

## 2018-03-25 DIAGNOSIS — E46 Unspecified protein-calorie malnutrition: Secondary | ICD-10-CM | POA: Diagnosis not present

## 2018-03-25 DIAGNOSIS — J301 Allergic rhinitis due to pollen: Secondary | ICD-10-CM | POA: Diagnosis not present

## 2018-03-25 DIAGNOSIS — F17200 Nicotine dependence, unspecified, uncomplicated: Secondary | ICD-10-CM | POA: Diagnosis not present

## 2018-03-27 ENCOUNTER — Encounter (HOSPITAL_COMMUNITY): Payer: Self-pay | Admitting: Cardiology

## 2018-03-27 ENCOUNTER — Emergency Department (HOSPITAL_COMMUNITY): Payer: Medicare Other

## 2018-03-27 ENCOUNTER — Emergency Department (HOSPITAL_COMMUNITY)
Admission: EM | Admit: 2018-03-27 | Discharge: 2018-03-27 | Disposition: A | Discharge status: Home / Self Care | Payer: Medicare Other | Source: Home / Self Care | Attending: Emergency Medicine | Admitting: Emergency Medicine

## 2018-03-27 DIAGNOSIS — R0600 Dyspnea, unspecified: Secondary | ICD-10-CM | POA: Insufficient documentation

## 2018-03-27 DIAGNOSIS — R634 Abnormal weight loss: Secondary | ICD-10-CM | POA: Diagnosis not present

## 2018-03-27 DIAGNOSIS — J449 Chronic obstructive pulmonary disease, unspecified: Secondary | ICD-10-CM | POA: Insufficient documentation

## 2018-03-27 DIAGNOSIS — J441 Chronic obstructive pulmonary disease with (acute) exacerbation: Secondary | ICD-10-CM | POA: Diagnosis not present

## 2018-03-27 DIAGNOSIS — R0602 Shortness of breath: Secondary | ICD-10-CM | POA: Diagnosis not present

## 2018-03-27 DIAGNOSIS — F17210 Nicotine dependence, cigarettes, uncomplicated: Secondary | ICD-10-CM | POA: Insufficient documentation

## 2018-03-27 DIAGNOSIS — Z681 Body mass index (BMI) 19 or less, adult: Secondary | ICD-10-CM | POA: Diagnosis not present

## 2018-03-27 DIAGNOSIS — K210 Gastro-esophageal reflux disease with esophagitis: Secondary | ICD-10-CM | POA: Diagnosis not present

## 2018-03-27 DIAGNOSIS — K2210 Ulcer of esophagus without bleeding: Secondary | ICD-10-CM | POA: Diagnosis not present

## 2018-03-27 DIAGNOSIS — Z79899 Other long term (current) drug therapy: Secondary | ICD-10-CM

## 2018-03-27 DIAGNOSIS — R1310 Dysphagia, unspecified: Secondary | ICD-10-CM

## 2018-03-27 DIAGNOSIS — R079 Chest pain, unspecified: Secondary | ICD-10-CM | POA: Diagnosis not present

## 2018-03-27 LAB — BASIC METABOLIC PANEL
Anion gap: 11 (ref 5–15)
BUN: 10 mg/dL (ref 6–20)
CO2: 28 mmol/L (ref 22–32)
Calcium: 9.5 mg/dL (ref 8.9–10.3)
Chloride: 102 mmol/L (ref 101–111)
Creatinine, Ser: 0.78 mg/dL (ref 0.44–1.00)
GFR calc Af Amer: >60 mL/min (ref >60)
GFR calc non Af Amer: >60 mL/min (ref >60)
Glucose, Bld: 94 mg/dL (ref 65–99)
Potassium: 4.4 mmol/L (ref 3.5–5.1)
Sodium: 141 mmol/L (ref 135–145)

## 2018-03-27 LAB — CBC
HCT: 36.9 % (ref 36.0–46.0)
Hemoglobin: 11.5 g/dL — ABNORMAL LOW (ref 12.0–15.0)
MCH: 26.3 pg (ref 26.0–34.0)
MCHC: 31.2 g/dL (ref 30.0–36.0)
MCV: 84.4 fL (ref 78.0–100.0)
Platelets: 270 10*3/uL (ref 150–400)
RBC: 4.37 MIL/uL (ref 3.87–5.11)
RDW: 16.2 % — ABNORMAL HIGH (ref 11.5–15.5)
WBC: 6.9 10*3/uL (ref 4.0–10.5)

## 2018-03-27 MED ORDER — DEXAMETHASONE 4 MG PO TABS
8.0000 mg | ORAL_TABLET | Freq: Once | ORAL | Status: AC
  Administered 2018-03-27: 8 mg via ORAL
  Filled 2018-03-27: qty 2

## 2018-03-27 MED ORDER — IPRATROPIUM-ALBUTEROL 0.5-2.5 (3) MG/3ML IN SOLN
3.0000 mL | Freq: Once | RESPIRATORY_TRACT | Status: AC
  Administered 2018-03-27: 3 mL via RESPIRATORY_TRACT
  Filled 2018-03-27: qty 3

## 2018-03-27 NOTE — ED Triage Notes (Signed)
C/o sob, sore throat and nasal congestion.  Pt wheezing in triage

## 2018-03-27 NOTE — ED Notes (Signed)
Patient transported to X-ray 

## 2018-03-27 NOTE — Discharge Instructions (Signed)
The term for difficulty swallowing you are describing is dysphagia. This may just be related to the sore throat you are having. The steroids you were given in the ER shoulder hopefully help with this. Your chest x-ray does not show anything concerning.

## 2018-03-29 ENCOUNTER — Inpatient Hospital Stay (HOSPITAL_COMMUNITY)
Admission: EM | Admit: 2018-03-29 | Discharge: 2018-03-31 | DRG: 191 | Disposition: A | Discharge status: Home / Self Care | Payer: Medicare Other | Attending: Pulmonary Disease | Admitting: Pulmonary Disease

## 2018-03-29 ENCOUNTER — Encounter (HOSPITAL_COMMUNITY): Payer: Self-pay | Admitting: Emergency Medicine

## 2018-03-29 ENCOUNTER — Other Ambulatory Visit: Payer: Self-pay

## 2018-03-29 ENCOUNTER — Emergency Department (HOSPITAL_COMMUNITY): Payer: Medicare Other

## 2018-03-29 DIAGNOSIS — K2900 Acute gastritis without bleeding: Secondary | ICD-10-CM | POA: Diagnosis present

## 2018-03-29 DIAGNOSIS — K2950 Unspecified chronic gastritis without bleeding: Secondary | ICD-10-CM | POA: Diagnosis present

## 2018-03-29 DIAGNOSIS — R1310 Dysphagia, unspecified: Secondary | ICD-10-CM

## 2018-03-29 DIAGNOSIS — K449 Diaphragmatic hernia without obstruction or gangrene: Secondary | ICD-10-CM | POA: Diagnosis present

## 2018-03-29 DIAGNOSIS — K210 Gastro-esophageal reflux disease with esophagitis: Secondary | ICD-10-CM | POA: Diagnosis present

## 2018-03-29 DIAGNOSIS — Z7189 Other specified counseling: Secondary | ICD-10-CM | POA: Diagnosis not present

## 2018-03-29 DIAGNOSIS — F17210 Nicotine dependence, cigarettes, uncomplicated: Secondary | ICD-10-CM | POA: Diagnosis present

## 2018-03-29 DIAGNOSIS — K209 Esophagitis, unspecified without bleeding: Secondary | ICD-10-CM | POA: Diagnosis present

## 2018-03-29 DIAGNOSIS — D638 Anemia in other chronic diseases classified elsewhere: Secondary | ICD-10-CM | POA: Diagnosis present

## 2018-03-29 DIAGNOSIS — R079 Chest pain, unspecified: Secondary | ICD-10-CM | POA: Diagnosis not present

## 2018-03-29 DIAGNOSIS — J441 Chronic obstructive pulmonary disease with (acute) exacerbation: Principal | ICD-10-CM | POA: Diagnosis present

## 2018-03-29 DIAGNOSIS — K269 Duodenal ulcer, unspecified as acute or chronic, without hemorrhage or perforation: Secondary | ICD-10-CM | POA: Diagnosis not present

## 2018-03-29 DIAGNOSIS — K3189 Other diseases of stomach and duodenum: Secondary | ICD-10-CM | POA: Diagnosis present

## 2018-03-29 DIAGNOSIS — Z681 Body mass index (BMI) 19 or less, adult: Secondary | ICD-10-CM

## 2018-03-29 DIAGNOSIS — K2210 Ulcer of esophagus without bleeding: Secondary | ICD-10-CM | POA: Diagnosis present

## 2018-03-29 DIAGNOSIS — R634 Abnormal weight loss: Secondary | ICD-10-CM | POA: Diagnosis present

## 2018-03-29 DIAGNOSIS — R4702 Dysphasia: Secondary | ICD-10-CM | POA: Diagnosis not present

## 2018-03-29 DIAGNOSIS — Z66 Do not resuscitate: Secondary | ICD-10-CM | POA: Diagnosis present

## 2018-03-29 DIAGNOSIS — R0602 Shortness of breath: Secondary | ICD-10-CM | POA: Diagnosis not present

## 2018-03-29 DIAGNOSIS — Z515 Encounter for palliative care: Secondary | ICD-10-CM

## 2018-03-29 DIAGNOSIS — Z79899 Other long term (current) drug therapy: Secondary | ICD-10-CM | POA: Diagnosis not present

## 2018-03-29 HISTORY — DX: Headache, unspecified: R51.9

## 2018-03-29 HISTORY — DX: Headache: R51

## 2018-03-29 LAB — TROPONIN I

## 2018-03-29 LAB — CBC WITH DIFFERENTIAL/PLATELET
Basophils Absolute: 0 10*3/uL (ref 0.0–0.1)
Basophils Relative: 1 %
EOS ABS: 0.3 10*3/uL (ref 0.0–0.7)
EOS PCT: 5 %
HCT: 37.7 % (ref 36.0–46.0)
Hemoglobin: 11.8 g/dL — ABNORMAL LOW (ref 12.0–15.0)
LYMPHS ABS: 3 10*3/uL (ref 0.7–4.0)
Lymphocytes Relative: 44 %
MCH: 26.4 pg (ref 26.0–34.0)
MCHC: 31.3 g/dL (ref 30.0–36.0)
MCV: 84.3 fL (ref 78.0–100.0)
MONOS PCT: 10 %
Monocytes Absolute: 0.7 10*3/uL (ref 0.1–1.0)
Neutro Abs: 2.9 10*3/uL (ref 1.7–7.7)
Neutrophils Relative %: 42 %
PLATELETS: 305 10*3/uL (ref 150–400)
RBC: 4.47 MIL/uL (ref 3.87–5.11)
RDW: 16.3 % — ABNORMAL HIGH (ref 11.5–15.5)
WBC: 6.9 10*3/uL (ref 4.0–10.5)

## 2018-03-29 LAB — BASIC METABOLIC PANEL
Anion gap: 10 (ref 5–15)
BUN: 17 mg/dL (ref 6–20)
CALCIUM: 9.5 mg/dL (ref 8.9–10.3)
CO2: 28 mmol/L (ref 22–32)
Chloride: 104 mmol/L (ref 101–111)
Creatinine, Ser: 0.89 mg/dL (ref 0.44–1.00)
GFR calc Af Amer: >60 mL/min (ref >60)
GLUCOSE: 98 mg/dL (ref 65–99)
Potassium: 4.2 mmol/L (ref 3.5–5.1)
Sodium: 142 mmol/L (ref 135–145)

## 2018-03-29 MED ORDER — IPRATROPIUM-ALBUTEROL 0.5-2.5 (3) MG/3ML IN SOLN
3.0000 mL | Freq: Four times a day (QID) | RESPIRATORY_TRACT | Status: DC
  Administered 2018-03-29: 3 mL via RESPIRATORY_TRACT
  Filled 2018-03-29: qty 3

## 2018-03-29 MED ORDER — METHYLPREDNISOLONE SODIUM SUCC 125 MG IJ SOLR
125.0000 mg | Freq: Once | INTRAMUSCULAR | Status: AC
  Administered 2018-03-29: 125 mg via INTRAVENOUS
  Filled 2018-03-29: qty 2

## 2018-03-29 MED ORDER — ALBUTEROL SULFATE (2.5 MG/3ML) 0.083% IN NEBU
5.0000 mg | INHALATION_SOLUTION | Freq: Once | RESPIRATORY_TRACT | Status: AC
  Administered 2018-03-29: 5 mg via RESPIRATORY_TRACT
  Filled 2018-03-29: qty 6

## 2018-03-29 MED ORDER — IPRATROPIUM-ALBUTEROL 0.5-2.5 (3) MG/3ML IN SOLN
3.0000 mL | Freq: Three times a day (TID) | RESPIRATORY_TRACT | Status: DC
  Administered 2018-03-30: 3 mL via RESPIRATORY_TRACT

## 2018-03-29 MED ORDER — SODIUM CHLORIDE 0.9 % IV SOLN
INTRAVENOUS | Status: AC
  Administered 2018-03-29: 19:00:00 via INTRAVENOUS

## 2018-03-29 MED ORDER — ALBUTEROL SULFATE (2.5 MG/3ML) 0.083% IN NEBU
5.0000 mg | INHALATION_SOLUTION | RESPIRATORY_TRACT | Status: DC | PRN
Start: 2018-03-29 — End: 2018-03-31

## 2018-03-29 MED ORDER — AZITHROMYCIN 500 MG IV SOLR
500.0000 mg | INTRAVENOUS | Status: DC
  Administered 2018-03-29 – 2018-03-30 (×2): 500 mg via INTRAVENOUS
  Filled 2018-03-29 (×4): qty 500

## 2018-03-29 MED ORDER — IPRATROPIUM BROMIDE 0.02 % IN SOLN
0.5000 mg | Freq: Once | RESPIRATORY_TRACT | Status: AC
  Administered 2018-03-29: 0.5 mg via RESPIRATORY_TRACT
  Filled 2018-03-29: qty 2.5

## 2018-03-29 MED ORDER — GUAIFENESIN ER 600 MG PO TB12
600.0000 mg | ORAL_TABLET | Freq: Two times a day (BID) | ORAL | Status: DC
  Administered 2018-03-29 – 2018-03-31 (×3): 600 mg via ORAL
  Filled 2018-03-29 (×5): qty 1

## 2018-03-29 MED ORDER — METHYLPREDNISOLONE SODIUM SUCC 40 MG IJ SOLR
40.0000 mg | Freq: Two times a day (BID) | INTRAMUSCULAR | Status: DC
  Administered 2018-03-30 – 2018-03-31 (×3): 40 mg via INTRAVENOUS
  Filled 2018-03-29 (×3): qty 1

## 2018-03-29 NOTE — ED Notes (Signed)
Pt here several days ago for same Has returned home and states unable to smoke for the last 2 days (usually smokes 3/4-1PPD) Has not used her rescue inhaler because she did not know if it would help

## 2018-03-29 NOTE — ED Provider Notes (Signed)
[facility] EMERGENCY DEPARTMENT Provider Note   CSN: [id number] Arrival date & time: 03/29/18  1255     History   Chief Complaint Chief Complaint  Patient presents with  . Shortness of Breath    HPI Katelyn [name] is a 77 y.o. female.  HPI Patient has a history of COPD.  Not on oxygen at home.  Has had shortness of breath over the last few days.  Seen in the ER 2 days ago although the note from that visit is not done.  Seems as if the complaint was some dysphagia.  Also reportedly having trouble breathing at that time has been on steroids.  She was given Decadron 2 days ago.  Continued shortness of breath and cough.  No sputum production.  States she feels tight in her throat.  Difficulty doing normal activity with it.  Also states that she feels as if food gets stuck when she tries to swallow and has been having some difficulty eating because of it.  Has had dilations in the past. Past Medical History:  Diagnosis Date  . Asthma   . Chronic neck pain   . Chronic pain of left elbow   . Complication of anesthesia    hard to wake  . COPD (chronic obstructive pulmonary disease) ([place])   . Dysphagia    requiring multiple dilations in past  . Irregular heart beat   . S/P colonoscopy 2009   dr. Gala Romney: anal papilla, 2 cecal AVMs. Due in 2014  . S/P endoscopy 2012   Dr. Gala Romney: mild chronic gastritis, bulbar erosions, s/p 54-French Maloney dilation   . S/P endoscopy 2003, 2004, 2007   gastritis, duodenitis, s/p dilation, +H.pylori     Patient Active Problem List   Diagnosis Date Noted  . COPD (chronic obstructive pulmonary disease) ([place]) 02/03/2018  . Hypoxia 11/28/2015  . COPD exacerbation ([place]) 11/28/2015  . Acute respiratory failure with hypoxia ([place]) 11/28/2015  . SOB (shortness of breath) 11/28/2015  . Mucosal abnormality of stomach   . History of colonic polyps 04/25/2015  . 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 Esophageal dysphagia 03/11/2011    Past Surgical History:    Procedure Laterality Date  . APPENDECTOMY    . CHOLECYSTECTOMY    . COLONOSCOPY  10/10/2008   RMR: 1. Anal papilla, otherwise normal rectum. 2. Two cecal arteriovenous malformations, colonic mucosa apperaed normal.   . COLONOSCOPY N/A 05/10/2015   RMR: Rectal and colonic polyps removed as described above. Ascending colon AVMs . Rectal polyps were tubular adenomas. Next colonoscopy May 2021.  . ESOPHAGEAL DILATION N/A 05/10/2015   Procedure: ESOPHAGEAL DILATION;  Surgeon: Daneil Dolin, MD;  Location: [facility] ENDO SUITE;  Service: Endoscopy;  Laterality: N/A;  . ESOPHAGOGASTRODUODENOSCOPY  04/02/2011   RMR: 1. Endoscopically normal -appearing esophagus status post passage of a Maloney dilator followed by biopsy. 2. Hiatal hernia, Mottling, and submucosal petechiae in teh gastric mucoas of uncertain significance status post biopsy. 3. Bulbar erosions as described above.   . ESOPHAGOGASTRODUODENOSCOPY N/A 05/10/2015   RMR: Normal esophagus status post Maloney dilation. Small hiatal hernia. Abnormal gastric because of doubtful clinical significance status post biopsy (benign)  . INGUINAL HERNIA REPAIR    . TONSILLECTOMY    . TOTAL ABDOMINAL HYSTERECTOMY       OB History    Gravida  5   Para  5   Term  4   Preterm  1   AB      Living  4  SAB      TAB      Ectopic      Multiple      Live Births               Home Medications    Prior to Admission medications   Medication Sig Start Date End Date Taking? Authorizing Provider  albuterol (PROVENTIL HFA;VENTOLIN HFA) 108 (90 BASE) MCG/ACT inhaler Inhale 2 puffs into the lungs every 6 (six) hours as needed for wheezing or shortness of breath. 09/04/15  Yes [name], MD  albuterol (PROVENTIL) (2.5 MG/3ML) 0.083% nebulizer solution Take 3 mLs (2.5 mg total) by nebulization every 4 (four) hours as needed for wheezing or shortness of breath. 09/04/15  Yes [name], MD  dexamethasone (DECADRON) 4 MG tablet Take 1 tablet (4  mg total) by mouth 2 (two) times daily with a meal. Patient not taking: Reported on 02/02/2018 12/01/17   Lily Kocher, PA-C  lidocaine (LIDODERM) 5 % Place 1 patch onto the skin daily. Remove & Discard patch within 12 hours or as directed by MD Patient not taking: Reported on 02/02/2018 12/15/17   Petrucelli, Aldona Bar R, PA-C  naproxen (NAPROSYN) 500 MG tablet Take 1 tablet (500 mg total) by mouth 2 (two) times daily. Patient not taking: Reported on 02/02/2018 12/15/17   Petrucelli, Glynda Jaeger, PA-C  traMADol (ULTRAM) 50 MG tablet Take 1 tablet (50 mg total) by mouth every 6 (six) hours as needed. Patient not taking: Reported on 02/02/2018 12/01/17   Lily Kocher, PA-C    Family History Family History  Problem Relation Age of Onset  . Heart disease Mother   . Lung cancer Father   . Colon cancer Neg Hx     Social History Social History   Tobacco Use  . Smoking status: Current Every Day Smoker    Packs/day: 1.00    Years: 57.00    Pack years: 57.00    Types: Cigarettes    Start date: 08/18/1959  . Smokeless tobacco: Never Used  Substance Use Topics  . Alcohol use: No    Alcohol/week: 0.0 oz  . Drug use: No     Allergies   Patient has no known allergies.   Review of Systems Review of Systems  Constitutional: Negative for fatigue.  HENT: Negative for congestion.   Respiratory: Positive for shortness of breath and wheezing.   Cardiovascular: Positive for chest pain.  Gastrointestinal: Negative for abdominal pain.  Genitourinary: Negative for dysuria.  Musculoskeletal: Negative for back pain.  Skin: Negative for rash.  Neurological: Negative for weakness.  Hematological: Negative for adenopathy.  Psychiatric/Behavioral: Negative for confusion.     Physical Exam Updated Vital Signs BP (!) 131/95 (BP Location: Right Arm)   Pulse (!) 107   Resp (!) 24   Ht 5\' 4"  (1.626 m)   Wt 42.2 kg (93 lb)   SpO2 94%   BMI 15.96 kg/m   Physical Exam  Constitutional: She  appears well-developed.  HENT:  Mouth/Throat: No oropharyngeal exudate.  Eyes: Pupils are equal, round, and reactive to light.  Neck: Neck supple.  Cardiovascular: Normal rate.  Pulmonary/Chest: She exhibits no tenderness and no crepitus.  Diffuse wheezes and prolonged expirations.  Abdominal: Soft. There is no tenderness.  Musculoskeletal:       Right lower leg: Normal.  Neurological: She is alert.  Skin: Skin is warm. Capillary refill takes less than 2 seconds.     ED Treatments / Results  Labs (all labs ordered are listed,  but only abnormal results are displayed) Labs Reviewed  CBC WITH DIFFERENTIAL/PLATELET - Abnormal; Notable for the following components:      Result Value   Hemoglobin 11.8 (*)    RDW 16.3 (*)    All other components within normal limits  TROPONIN I  BASIC METABOLIC PANEL    EKG EKG Interpretation  Date/Time:  Sunday March 29 2018 13:17:02 EDT Ventricular Rate:  84 PR Interval:    QRS Duration: 100 QT Interval:  387 QTC Calculation: 458 R Axis:   -93 Text Interpretation:  Sinus rhythm LAD, consider left anterior fascicular block RSR' in V1 or V2, probably normal variant Borderline repolarization abnormality Baseline wander in lead(s) I II aVR V2 V3 PACS Confirmed by Davonna Belling [phone]) on 03/29/2018 1:37:23 PM   Radiology Dg Chest 2 View  Result Date: 03/29/2018 CLINICAL DATA:  Short of breath, smoker EXAM: CHEST - 2 VIEW COMPARISON:  03/27/2018 FINDINGS: COPD with hyperinflation and pulmonary scarring. No superimposed infiltrate or effusion. Negative for heart failure or effusion. No mass lesion. IMPRESSION: COPD with scarring.  No acute abnormality. Electronically Signed   By: Franchot Gallo M.D.   On: 03/29/2018 13:44    Procedures Procedures (including critical care time)  Medications Ordered in ED Medications  albuterol (PROVENTIL) (2.5 MG/3ML) 0.083% nebulizer solution 5 mg (5 mg Nebulization Given 03/29/18 1451)  ipratropium  (ATROVENT) nebulizer solution 0.5 mg (0.5 mg Nebulization Given 03/29/18 1452)  methylPREDNISolone sodium succinate (SOLU-MEDROL) 125 mg/2 mL injection 125 mg (125 mg Intravenous Given 03/29/18 1345)     Initial Impression / Assessment and Plan / ED Course  I have reviewed the triage vital signs and the nursing notes.  Pertinent labs & imaging results that were available during my care of the patient were reviewed by me and considered in my medical decision making (see chart for details).    Patient with COPD exacerbation.  Currently on steroids.  Continued dyspnea.  With continued steroid use feel as if she could benefit from admission.  Also has been having difficulty eating due to the feeling of food getting caught in her throat.  Has had problems with this before and has seen Dr. Buford Dresser in the past.  Will admit to hospitalist.  Final Clinical Impressions(s) / ED Diagnoses   Final diagnoses:  COPD exacerbation ([place])  Dysphagia, unspecified type    ED Discharge Orders    None       Davonna Belling, MD 03/29/18 1519

## 2018-03-29 NOTE — H&P (Signed)
History and Physical    Katelyn [name] XBD:[id number] DOB: Jun 19, 1941 DOA: 03/29/2018  PCP: Katelyn Du, MD   Patient coming from: Home  Chief Complaint: SOB, cough, difficulty swallowing.  HPI: Katelyn [name] is a 77 y.o. female with medical history significant for COPD, esophageal dysphagia.  Patient presented to the ED with complaints of shortness of breath, cough productive of whitish sputum, with associated wheezing of 4-5 days.  No fever no chills, no sick contacts, no chest pain.  Patient presented to the ED 2 days ago with similar symptoms, but symptoms have persisted.  Patient is not on home O2, still smokes cigarettes 3/4 to 1PPD. Patient also reported food getting stuck in her throat-both solids and liquids, over the past 1-2 days.  She has a history of esophageal dysphagia, requiring dilatations in the past, last dilatation appears to be 04/2015.   ED Course: Stable vitals in the ED, O2 sats greater than 93% on room air.  WBC 6.9, troponin <0.03.  Chest x-ray-COPD with scarring.  EKG-no significant change from prior.  Patient was given Solu-Medrol 125 mg, and bronchodilators.  Hospitalist was called to admit for COPD exacerbation and esophageal dysphagia.  Review of Systems: As per HPI otherwise 10 point review of systems negative.   Past Medical History:  Diagnosis Date  . Asthma   . Chronic neck pain   . Chronic pain of left elbow   . Complication of anesthesia    hard to wake  . COPD (chronic obstructive pulmonary disease) ([place])   . Dysphagia    requiring multiple dilations in past  . Irregular heart beat   . S/P colonoscopy 2009   dr. Gala [name]: anal papilla, 2 cecal AVMs. Due in 2014  . S/P endoscopy 2012   Dr. Gala [name]: mild chronic gastritis, bulbar erosions, s/p 54-French Maloney dilation   . S/P endoscopy 2003, 2004, 2007   gastritis, duodenitis, s/p dilation, +H.pylori     Past Surgical History:  Procedure Laterality Date  . APPENDECTOMY    . CHOLECYSTECTOMY      . COLONOSCOPY  10/10/2008   RMR: 1. Anal papilla, otherwise normal rectum. 2. Two cecal arteriovenous malformations, colonic mucosa apperaed normal.   . COLONOSCOPY N/A 05/10/2015   RMR: Rectal and colonic polyps removed as described above. Ascending colon AVMs . Rectal polyps were tubular adenomas. Next colonoscopy May 2021.  . ESOPHAGEAL DILATION N/A 05/10/2015   Procedure: ESOPHAGEAL DILATION;  Surgeon: Daneil Dolin, MD;  Location: [facility];  Service: Endoscopy;  Laterality: N/A;  . ESOPHAGOGASTRODUODENOSCOPY  04/02/2011   RMR: 1. Endoscopically normal -appearing esophagus status post passage of a Maloney dilator followed by biopsy. 2. Hiatal hernia, Mottling, and submucosal petechiae in teh gastric mucoas of uncertain significance status post biopsy. 3. Bulbar erosions as described above.   . ESOPHAGOGASTRODUODENOSCOPY N/A 05/10/2015   RMR: Normal esophagus status post Maloney dilation. Small hiatal hernia. Abnormal gastric because of doubtful clinical significance status post biopsy (benign)  . INGUINAL HERNIA REPAIR    . TONSILLECTOMY    . TOTAL ABDOMINAL HYSTERECTOMY       reports that she has been smoking cigarettes.  She started smoking about 58 years ago. She has a 57.00 pack-year smoking history. She has never used smokeless tobacco. She reports that she does not drink alcohol or use drugs.  No Known Allergies  Family History  Problem Relation Age of Onset  . Heart disease Mother   . Lung cancer Father   . Colon cancer  Neg Hx     Prior to Admission medications   Medication Sig Start Date End Date Taking? Authorizing Provider  albuterol (PROVENTIL HFA;VENTOLIN HFA) 108 (90 BASE) MCG/ACT inhaler Inhale 2 puffs into the lungs every 6 (six) hours as needed for wheezing or shortness of breath. 09/04/15  Yes [name], MD  albuterol (PROVENTIL) (2.5 MG/3ML) 0.083% nebulizer solution Take 3 mLs (2.5 mg total) by nebulization every 4 (four) hours as needed for wheezing or  shortness of breath. 09/04/15  Yes [name], MD  dexamethasone (DECADRON) 4 MG tablet Take 1 tablet (4 mg total) by mouth 2 (two) times daily with a meal. Patient not taking: Reported on 02/02/2018 12/01/17   Katelyn Kocher, PA-C  lidocaine (LIDODERM) 5 % Place 1 patch onto the skin daily. Remove & Discard patch within 12 hours or as directed by MD Patient not taking: Reported on 02/02/2018 12/15/17   Petrucelli, Katelyn Bar R, PA-C  naproxen (NAPROSYN) 500 MG tablet Take 1 tablet (500 mg total) by mouth 2 (two) times daily. Patient not taking: Reported on 02/02/2018 12/15/17   Petrucelli, Katelyn Jaeger, PA-C  traMADol (ULTRAM) 50 MG tablet Take 1 tablet (50 mg total) by mouth every 6 (six) hours as needed. Patient not taking: Reported on 02/02/2018 12/01/17   Katelyn Kocher, PA-C    Physical Exam: Vitals:   03/29/18 1330 03/29/18 1400 03/29/18 1430 03/29/18 1500  BP: 122/83 128/71 (!) 89/77 131/74  Pulse: 80 79 71 60  Resp: (!) 26 (!) 25 (!) 22 18  SpO2: 96% 93% 93% 100%  Weight:      Height:        Constitutional: NAD, calm, comfortable, on room air Vitals:   03/29/18 1330 03/29/18 1400 03/29/18 1430 03/29/18 1500  BP: 122/83 128/71 (!) 89/77 131/74  Pulse: 80 79 71 60  Resp: (!) 26 (!) 25 (!) 22 18  SpO2: 96% 93% 93% 100%  Weight:      Height:       Eyes: PERRL, lids and conjunctivae normal ENMT: Mucous membranes are moist. Posterior pharynx clear of any exudate or lesions..  Neck: normal, supple, no masses, no thyromegaly Respiratory: Diffuse inspiratory and expiratory wheezing, Normal respiratory effort. No accessory muscle use.  Cardiovascular: Regular rate and rhythm, no murmurs / rubs / gallops. No extremity edema. 2+ pedal pulses.  Abdomen: no tenderness, no masses palpated. No hepatosplenomegaly. Bowel sounds positive.  Musculoskeletal: no clubbing / cyanosis. No joint deformity upper and lower extremities. Good ROM, no contractures. Normal muscle tone.  Skin: no rashes,  lesions, ulcers. No induration Neurologic: CN 2-12 grossly intact.  Strength 5/5 in all 4.  Psychiatric: Normal judgment and insight. Alert and oriented x 3. Normal mood.   Labs on Admission: I have personally reviewed following labs and imaging studies  CBC: Recent Labs  Lab 03/27/18 1037 03/29/18 1337  WBC 6.9 6.9  NEUTROABS  --  2.9  HGB 11.5* 11.8*  HCT 36.9 37.7  MCV 84.4 84.3  PLT 270 825   Basic Metabolic Panel: Recent Labs  Lab 03/27/18 1037 03/29/18 1337  NA 141 142  K 4.4 4.2  CL 102 104  CO2 28 28  GLUCOSE 94 98  BUN 10 17  CREATININE 0.78 0.89  CALCIUM 9.5 9.5   Cardiac Enzymes: Recent Labs  Lab 03/29/18 1337  TROPONINI <0.03    Radiological Exams on Admission: Dg Chest 2 View  Result Date: 03/29/2018 CLINICAL DATA:  Short of breath, smoker EXAM: CHEST - 2 VIEW  COMPARISON:  03/27/2018 FINDINGS: COPD with hyperinflation and pulmonary scarring. No superimposed infiltrate or effusion. Negative for heart failure or effusion. No mass lesion. IMPRESSION: COPD with scarring.  No acute abnormality. Electronically Signed   By: Franchot Gallo M.D.   On: 03/29/2018 13:44    EKG: Independently reviewed.  Old T wave inversions aVL and lead II. no significant EKG changes from prior.  Assessment/Plan Principal Problem:   COPD with acute exacerbation (HCC) Active Problems:   Esophageal dysphagia  COPD exacerbation-productive cough, SOB, wheezing.  WBC- 6.9, chest x-ray-COPD with scarring.  Maintaining O2 sats on room air.  Current smoker. Solu-Medrol 125 given in ED -Continue Solu-Medrol 40 mg twice daily -IV azithromycin -Duo nebs scheduled, as needed albuterol - Mucolytics, supp O2. -Emphasize smoking cessation  Esophageal dysphagia-solids and liquids.  History of esophageal dilatation in the past, last appears to be 04/2015. -N.p.o. for now -IV fluids ns 100cc/hr X 1 day, while n.p.o. -GI consult-order placed  DVT prophylaxis: SCDs, pending GI  evaluation Code Status: full Family Communication: None at bedside Disposition Plan: >2 days Consults called: GI consult Admission status: Inpatient, med surg  Bethena Roys MD [facility] Pager [phone] From 3PM-11PM.  Otherwise please contact night-coverage [email or web address] Password [email or web address]  03/29/2018, 3:41 PM

## 2018-03-29 NOTE — ED Notes (Signed)
Call for report  RN did not answer call - will call back

## 2018-03-29 NOTE — ED Notes (Signed)
hospitalist at bedside

## 2018-03-29 NOTE — ED Notes (Signed)
To radiology

## 2018-03-29 NOTE — ED Triage Notes (Signed)
Returns for shortness of breath  Has audible wheezing  Cigarette abuser 1ppd

## 2018-03-29 NOTE — ED Notes (Signed)
From Rad  Resp aware of orders but currently in emergency

## 2018-03-30 ENCOUNTER — Encounter (HOSPITAL_COMMUNITY): Payer: Self-pay | Admitting: Primary Care

## 2018-03-30 ENCOUNTER — Inpatient Hospital Stay (HOSPITAL_COMMUNITY): Payer: Medicare Other | Admitting: Anesthesiology

## 2018-03-30 ENCOUNTER — Other Ambulatory Visit: Payer: Self-pay

## 2018-03-30 ENCOUNTER — Encounter (HOSPITAL_COMMUNITY)
Admission: EM | Disposition: A | Discharge status: Home / Self Care | Payer: Self-pay | Source: Home / Self Care | Attending: Pulmonary Disease

## 2018-03-30 DIAGNOSIS — Z7189 Other specified counseling: Secondary | ICD-10-CM

## 2018-03-30 DIAGNOSIS — J441 Chronic obstructive pulmonary disease with (acute) exacerbation: Principal | ICD-10-CM

## 2018-03-30 DIAGNOSIS — K209 Esophagitis, unspecified: Secondary | ICD-10-CM

## 2018-03-30 DIAGNOSIS — Z515 Encounter for palliative care: Secondary | ICD-10-CM

## 2018-03-30 DIAGNOSIS — K269 Duodenal ulcer, unspecified as acute or chronic, without hemorrhage or perforation: Secondary | ICD-10-CM

## 2018-03-30 DIAGNOSIS — R1310 Dysphagia, unspecified: Secondary | ICD-10-CM

## 2018-03-30 HISTORY — PX: MALONEY DILATION: SHX5535

## 2018-03-30 HISTORY — PX: BIOPSY: SHX5522

## 2018-03-30 HISTORY — PX: ESOPHAGOGASTRODUODENOSCOPY (EGD) WITH PROPOFOL: SHX5813

## 2018-03-30 SURGERY — ESOPHAGOGASTRODUODENOSCOPY (EGD) WITH PROPOFOL
Anesthesia: Monitor Anesthesia Care

## 2018-03-30 MED ORDER — PROPOFOL 500 MG/50ML IV EMUL
INTRAVENOUS | Status: DC | PRN
  Administered 2018-03-30: 100 ug/kg/min via INTRAVENOUS

## 2018-03-30 MED ORDER — PANTOPRAZOLE SODIUM 40 MG PO TBEC
40.0000 mg | DELAYED_RELEASE_TABLET | Freq: Every day | ORAL | Status: DC
  Administered 2018-03-30 – 2018-03-31 (×2): 40 mg via ORAL
  Filled 2018-03-30 (×2): qty 1

## 2018-03-30 MED ORDER — LACTATED RINGERS IV SOLN
INTRAVENOUS | Status: DC
  Administered 2018-03-30: 16:00:00 via INTRAVENOUS

## 2018-03-30 MED ORDER — LIDOCAINE VISCOUS 2 % MT SOLN
OROMUCOSAL | Status: AC
  Filled 2018-03-30: qty 15

## 2018-03-30 MED ORDER — LIDOCAINE VISCOUS 2 % MT SOLN
5.0000 mL | Freq: Once | OROMUCOSAL | Status: AC
  Administered 2018-03-30: 5 mL via OROMUCOSAL

## 2018-03-30 MED ORDER — IPRATROPIUM-ALBUTEROL 0.5-2.5 (3) MG/3ML IN SOLN
3.0000 mL | Freq: Two times a day (BID) | RESPIRATORY_TRACT | Status: DC
  Administered 2018-03-30 – 2018-03-31 (×2): 3 mL via RESPIRATORY_TRACT
  Filled 2018-03-30 (×2): qty 3

## 2018-03-30 MED ORDER — PROPOFOL 10 MG/ML IV BOLUS
INTRAVENOUS | Status: DC | PRN
  Administered 2018-03-30 (×4): 20 mg via INTRAVENOUS

## 2018-03-30 MED ORDER — SODIUM CHLORIDE 0.9 % IV SOLN: INTRAVENOUS | Status: DC

## 2018-03-30 NOTE — Op Note (Signed)
[facility] Patient Name: Katelyn [name] Procedure Date: 03/30/2018 3:34 PM MRN: [id number] Date of Birth: 1941-12-06 Attending MD: Gennette Pac , MD CSN: [id number] Age: 77 Admit Type: Inpatient Procedure:                Upper GI endoscopy Indications:              Dysphagia Providers:                Gennette Pac, MD, Criselda Peaches. Patsy Lager, RN,                            Edythe Clarity, Technician Referring MD:              Medicines:                Propofol per Anesthesia Complications:            No immediate complications. Estimated Blood Loss:     Estimated blood loss was minimal. Procedure:                Pre-Anesthesia Assessment:                           - Prior to the procedure, a History and Physical                            was performed, and patient medications and                            allergies were reviewed. The patient's tolerance of                            previous anesthesia was also reviewed. The risks                            and benefits of the procedure and the sedation                            options and risks were discussed with the patient.                            All questions were answered, and informed consent                            was obtained. Prior Anticoagulants: The patient has                            taken no previous anticoagulant or antiplatelet                            agents. ASA Grade Assessment: II - A patient with                            mild systemic disease. After reviewing the risks  and benefits, the patient was deemed in                            satisfactory condition to undergo the procedure.                           After obtaining informed consent, the endoscope was                            passed under direct vision. Throughout the                            procedure, the patient's blood pressure, pulse, and                            oxygen saturations were  monitored continuously. The                            [id number] ([id number]) scope was introduced through the                            mouth, and advanced to the second part of duodenum.                            The upper GI endoscopy was accomplished without                            difficulty. The patient tolerated the procedure                            well. Scope In: 4:01:17 PM Scope Out: 4:09:45 PM Total Procedure Duration: 0 hours 8 minutes 28 seconds  Findings:      Esophagitis was found. The scope was withdrawn. Dilation was performed       with a Maloney dilator with no resistance at 54 Fr. The scope was       withdrawn. Dilation was performed with a Maloney dilator with moderate       resistance at 56 Fr. The dilation site was examined following endoscope       reinsertion and showed no change. Estimated blood loss: none.      A small hiatal hernia was present.      Multiple erosions were found in the stomach. This was biopsied with a       cold forceps for histology. Estimated blood loss was minimal.      A few localized erosions were found in the duodenal bulb. 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 Impression:               - Mild erosive reflux esophagitis. Dilated.                           - Small hiatal hernia.                           - Erosive gastropathy. Biopsied.                           -  Duodenal erosions / AVM. Moderate Sedation:      Moderate (conscious) sedation was administered by the endoscopy nurse       and supervised by the endoscopist. The following parameters were       monitored: oxygen saturation, heart rate, blood pressure, respiratory       rate, EKG, adequacy of pulmonary ventilation, and response to care.       Total physician intraservice time was 14 minutes. Recommendation:           - Return patient to hospital ward for ongoing care.                           - Soft diet.                           - Continue present medications. Protonix 40 mg  daily                           - No repeat upper endoscopy.                           - Return to GI office (date not yet determined).                            Follow-up Path. Procedure Code(s):        --- Professional ---                           [id number], Moderate sedation services provided by the                            same physician or other qualified health care                            professional performing the diagnostic or                            therapeutic service that the sedation supports,                            requiring the presence of an independent trained                            observer to assist in the monitoring of the                            patient's level of consciousness and physiological                            status; initial 15 minutes of intraservice time,                            patient age 24 years or older Diagnosis Code(s):        --- Professional ---  K20.9, Esophagitis, unspecified                           K44.9, Diaphragmatic hernia without obstruction or                            gangrene                           K31.89, Other diseases of stomach and duodenum                           K26.9, Duodenal ulcer, unspecified as acute or                            chronic, without hemorrhage or perforation                           R13.10, Dysphagia, unspecified CPT copyright 2017 [employer]. All rights reserved. The codes documented in this report are preliminary and upon coder review may  be revised to meet current compliance requirements. Gerrit Friends. Arne Schlender, MD Gennette Pac, MD 03/30/2018 4:19:39 PM This report has been signed electronically. Number of Addenda: 0

## 2018-03-30 NOTE — Anesthesia Preprocedure Evaluation (Signed)
Anesthesia Evaluation  Patient identified by MRN, date of birth, ID band Patient awake    Reviewed: Allergy & Precautions, H&P , NPO status , Patient's Chart, lab work & pertinent test results  Airway Mallampati: II  TM Distance: >3 FB Neck ROM: full    Dental no notable dental hx. (+) Poor Dentition, Chipped, Missing   Pulmonary neg pulmonary ROS, shortness of breath and with exertion, asthma , COPD,  COPD inhaler, Current Smoker,    Pulmonary exam normal breath sounds clear to auscultation + decreased breath sounds      Cardiovascular Exercise Tolerance: Good negative cardio ROS   Rhythm:regular Rate:Normal     Neuro/Psych negative neurological ROS  negative psych ROS   GI/Hepatic negative GI ROS, Neg liver ROS,   Endo/Other  negative endocrine ROS  Renal/GU negative Renal ROS  negative genitourinary   Musculoskeletal   Abdominal   Peds  Hematology negative hematology ROS (+)   Anesthesia Other Findings   Reproductive/Obstetrics negative OB ROS                             Anesthesia Physical Anesthesia Plan  ASA: III  Anesthesia Plan: MAC   Post-op Pain Management:    Induction:   PONV Risk Score and Plan: Propofol infusion  Airway Management Planned:   Additional Equipment:   Intra-op Plan:   Post-operative Plan:   Informed Consent: I have reviewed the patients History and Physical, chart, labs and discussed the procedure including the risks, benefits and alternatives for the proposed anesthesia with the patient or authorized representative who has indicated his/her understanding and acceptance.   Dental Advisory Given  Plan Discussed with: CRNA  Anesthesia Plan Comments:         Anesthesia Quick Evaluation

## 2018-03-30 NOTE — Consult Note (Signed)
Consultation Note Date: 03/30/2018   Patient Name: Katelyn [name]  DOB: 1941/02/27  MRN: [id number]  Age / Sex: 77 y.o., female  PCP: Sinda Du, MD Referring Physician: Sinda Du, MD  Reason for Consultation: Establishing goals of care and Psychosocial/spiritual support  HPI/Patient Profile: 77 y.o. female  with past medical history of COPD, still smoking, dysphasia requiring multiple dilations in the past, irregular heartbeat, chronic neck pain admitted on 03/29/2018 with COPD exacerbation and esophageal dysphasia requiring dilation.   Clinical Assessment and Goals of Care:  Katelyn [name] is resting quietly in bed.  She greets me making and keeping eye contact.  She is alert and oriented, calm and cooperative.  There is no family at bedside at this time.  We talked about her current health concerns including COPD.  She tells me that she is still smoking but "trying to cut back".  She states she has gone to "weaker cigarettes".  She tells me that her goal is to not smoke when she returns home, but she is unsure if she will be successful.  We talked about the immediate benefits of smoking cessation.  Normal weight for her is 100-105 pounds.  She states she has not been eating well due to esophageal stricture.  She states at first she thought this was related to her COPD, but then she realized it was stricture.  We talked about diet modifications including protein drinks that are not milk-based to help build her reserve/muscle.  We talked about her home life.  She was born and raised in [place], but married her husband Eddie Dibbles, [employer], and they transferred to [place].  They were then transferred to [place].  Her husband Eddie Dibbles killed himself around 2002.  She has 4 sons, her youngest son Katelyn [name] lives in her home here in [place].  She is still active and independent, independent with IADLs, but her  son Katelyn [name] helps with cutting the grass and some household chores.  She continues to do housework cleaning/cooking.  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  She also has several grandchildren.  We talked about esophageal dilation that will occur today.  She has no questions, stating that she has had this procedure several times.  We talked about healthcare power of attorney, see below.  We talked about CODE STATUS, see below.   Healthcare power of attorney NEXT OF KIN -Katelyn [name] states that her son Katelyn [name] who lives in the home with her would be her primary spokes person.  She has 3 other sons including Katelyn [name] who lives in [place], Katelyn [name] who live in [place].  She prefers that they make choices as a group.  We talked about the importance of having conversations related to what she does and does not want.   SUMMARY OF RECOMMENDATIONS   Treat the treatable but no CPR, no intubation.  Code Status/Advance Care Planning:  DNR -treat the treatable but no CPR, no intubation confirmed with [name] today.  She speaks of  her face, and her "appointed time".  I ask if her children can abide this wish and she states that she is not sure.  I shared that the most important part of healthcare power of attorney is having discussions about what she does and does not want.  Symptom Management:   Per attending, no additional needs at this time.  Palliative Prophylaxis:   Frequent Pain Assessment  Additional Recommendations (Limitations, Scope, Preferences):  Treat the treatable but no CPR, no intubation.  Psycho-social/Spiritual:   Desire for further Chaplaincy support:no  Additional Recommendations: Caregiving  Support/Resources  Prognosis:   > 12 months, would not be surprising based on current functional health status.  Discharge Planning: Return to home, unlikely to need home health services.      Primary Diagnoses: Present on Admission: . COPD  with acute exacerbation ([place])   I have reviewed the medical record, interviewed the patient and family, and examined the patient. The following aspects are pertinent.  Past Medical History:  Diagnosis Date  . Asthma   . Chronic neck pain   . Chronic pain of left elbow   . Complication of anesthesia    hard to wake  . COPD (chronic obstructive pulmonary disease) ([place])   . Dysphagia    requiring multiple dilations in past  . Irregular heart beat   . S/P colonoscopy 2009   dr. Gala Romney: anal papilla, 2 cecal AVMs. Due in 2014  . S/P endoscopy 2012   Dr. Gala Romney: mild chronic gastritis, bulbar erosions, s/p 54-French Maloney dilation   . S/P endoscopy 2003, 2004, 2007   gastritis, duodenitis, s/p dilation, +H.pylori    Social History   Socioeconomic History  . Marital status: Widowed    Spouse name: Not on file  . Number of children: Not on file  . Years of education: Not on file  . Highest education level: Not on file  Occupational History  . Not on file  Social Needs  . Financial resource strain: Not on file  . Food insecurity:    Worry: Not on file    Inability: Not on file  . Transportation needs:    Medical: Not on file    Non-medical: Not on file  Tobacco Use  . Smoking status: Current Every Day Smoker    Packs/day: 1.00    Years: 57.00    Pack years: 57.00    Types: Cigarettes    Start date: 08/18/1959  . Smokeless tobacco: Never Used  Substance and Sexual Activity  . Alcohol use: No    Alcohol/week: 0.0 oz  . Drug use: No  . Sexual activity: Not on file  Lifestyle  . Physical activity:    Days per week: Not on file    Minutes per session: Not on file  . Stress: Not on file  Relationships  . Social connections:    Talks on phone: Not on file    Gets together: Not on file    Attends religious service: Not on file    Active member of club or organization: Not on file    Attends meetings of clubs or organizations: Not on file    Relationship status: Not on  file  Other Topics Concern  . Not on file  Social History Narrative  . Not on file   Family History  Problem Relation Age of Onset  . Heart disease Mother   . Lung cancer Father   . Colon cancer Neg Hx    Scheduled Meds: . guaiFENesin  600 mg Oral BID  . ipratropium-albuterol  3 mL Nebulization BID  . methylPREDNISolone (SOLU-MEDROL) injection  40 mg Intravenous Q12H   Continuous Infusions: . sodium chloride 100 mL/hr at 03/29/18 1909  . azithromycin Stopped (03/29/18 2321)   PRN Meds:.albuterol Medications Prior to Admission:  Prior to Admission medications   Medication Sig Start Date End Date Taking? Authorizing Provider  albuterol (PROVENTIL HFA;VENTOLIN HFA) 108 (90 BASE) MCG/ACT inhaler Inhale 2 puffs into the lungs every 6 (six) hours as needed for wheezing or shortness of breath. 09/04/15  Yes [name], MD  albuterol (PROVENTIL) (2.5 MG/3ML) 0.083% nebulizer solution Take 3 mLs (2.5 mg total) by nebulization every 4 (four) hours as needed for wheezing or shortness of breath. 09/04/15  Yes [name], MD  dexamethasone (DECADRON) 4 MG tablet Take 1 tablet (4 mg total) by mouth 2 (two) times daily with a meal. Patient not taking: Reported on 02/02/2018 12/01/17   Lily Kocher, PA-C  lidocaine (LIDODERM) 5 % Place 1 patch onto the skin daily. Remove & Discard patch within 12 hours or as directed by MD Patient not taking: Reported on 02/02/2018 12/15/17   Petrucelli, Aldona Bar R, PA-C  naproxen (NAPROSYN) 500 MG tablet Take 1 tablet (500 mg total) by mouth 2 (two) times daily. Patient not taking: Reported on 02/02/2018 12/15/17   Petrucelli, Glynda Jaeger, PA-C  traMADol (ULTRAM) 50 MG tablet Take 1 tablet (50 mg total) by mouth every 6 (six) hours as needed. Patient not taking: Reported on 02/02/2018 12/01/17   Lily Kocher, PA-C   No Known Allergies Review of Systems  Unable to perform ROS: Other    Physical Exam  Constitutional: She is oriented to person, place, and  time. She does not appear ill.  Makes and keeps eye contact, appears in stable health  HENT:  Head: Normocephalic and atraumatic.  Cardiovascular: Normal rate.  Pulmonary/Chest: Effort normal. No respiratory distress.  Abdominal: Soft. She exhibits no distension.  Musculoskeletal:       Right lower leg: She exhibits no edema.       Left lower leg: She exhibits no edema.  Neurological: She is alert and oriented to person, place, and time.  Skin: Skin is warm and dry.  Psychiatric: Her mood appears not anxious. She is not agitated.  Nursing note and vitals reviewed.   Vital Signs: BP 111/62 (BP Location: Right Arm)   Pulse 61   Temp 97.9 F (36.6 C) (Oral)   Resp 17   Ht 5\' 4"  (1.626 m)   Wt 42.2 kg (93 lb 0.6 oz)   SpO2 97%   BMI 15.97 kg/m  Pain Scale: 0-10   Pain Score: 0-No pain   SpO2: SpO2: 97 % O2 Device:SpO2: 97 % O2 Flow Rate: .   IO: Intake/output summary:   Intake/Output Summary (Last 24 hours) at 03/30/2018 1203 Last data filed at 03/30/2018 0900 Gross per 24 hour  Intake 240 ml  Output 450 ml  Net -210 ml    LBM: Last BM Date: 03/29/18 Baseline Weight: Weight: 42.2 kg (93 lb) Most recent weight: Weight: 42.2 kg (93 lb 0.6 oz)     Palliative Assessment/Data:   Flowsheet Rows     Most Recent Value  Intake Tab  Referral Department  Pulmonary  Unit at Time of Referral  Med/Surg Unit  Palliative Care Primary Diagnosis  Pulmonary  Date Notified  03/30/18  Palliative Care Type  New Palliative care  Reason for referral  Clarify Goals of Care  Date of Admission  03/29/18  Date first seen by Palliative Care  03/30/18  # of days Palliative referral response time  0 Day(s)  # of days IP prior to Palliative referral  1  Clinical Assessment  Palliative Performance Scale Score  70%  Pain Max last 24 hours  Not able to report  Pain Min Last 24 hours  Not able to report  Dyspnea Max Last 24 Hours  Not able to report  Dyspnea Min Last 24 hours  Not able to  report  Psychosocial & Spiritual Assessment  Palliative Care Outcomes  Patient/Family meeting held?  Yes  Who was at the meeting?  patient at bedside  Palliative Care Outcomes  Clarified goals of care, Provided psychosocial or spiritual support, Provided advance care planning  Patient/Family wishes: Interventions discontinued/not started   Mechanical Ventilation      Time In: 1050 Time Out: 1140 Time Total: 50 minutes Greater than 50%  of this time was spent counseling and coordinating care related to the above assessment and plan.  Signed by: Drue Novel, NP   Please contact Palliative Medicine Team phone at [phone] for questions and concerns.  For individual provider: See Shea Evans

## 2018-03-30 NOTE — Progress Notes (Signed)
Subjective: She was admitted with COPD exacerbation and solid food dysphagia.  She says her breathing is doing pretty well.  She has had previous episodes of dysphagia requiring esophageal dilatation.  Objective: Vital signs in last 24 hours: Temp:  [97.9 F (36.6 C)-98.3 F (36.8 C)] 97.9 F (36.6 C) (04/08 0500) Pulse Rate:  [60-107] 61 (04/08 0500) Resp:  [15-26] 17 (04/08 0500) BP: (89-131)/(54-95) 111/62 (04/08 0500) SpO2:  [87 %-100 %] 97 % (04/08 0821) Weight:  [42.2 kg (93 lb)-42.2 kg (93 lb 0.6 oz)] 42.2 kg (93 lb 0.6 oz) (04/07 1825) Weight change:  Last BM Date: 03/29/18  Intake/Output from previous day: 04/07 0701 - 04/08 0700 In: 240 [P.O.:240] Out: 450 [Urine:450]  PHYSICAL EXAM General appearance: alert, cooperative and no distress Resp: rhonchi bilaterally Cardio: regular rate and rhythm, S1, S2 normal, no murmur, click, rub or gallop GI: soft, non-tender; bowel sounds normal; no masses,  no organomegaly Extremities: extremities normal, atraumatic, no cyanosis or edema She is very thin  Lab Results:  Results for orders placed or performed during the hospital encounter of 03/29/18 (from the past 48 hour(s))  CBC with Differential     Status: Abnormal   Collection Time: 03/29/18  1:37 PM  Result Value Ref Range   WBC 6.9 4.0 - 10.5 K/uL   RBC 4.47 3.87 - 5.11 MIL/uL   Hemoglobin 11.8 (L) 12.0 - 15.0 g/dL   HCT 37.7 36.0 - 46.0 %   MCV 84.3 78.0 - 100.0 fL   MCH 26.4 26.0 - 34.0 pg   MCHC 31.3 30.0 - 36.0 g/dL   RDW 16.3 (H) 11.5 - 15.5 %   Platelets 305 150 - 400 K/uL   Neutrophils Relative % 42 %   Neutro Abs 2.9 1.7 - 7.7 K/uL   Lymphocytes Relative 44 %   Lymphs Abs 3.0 0.7 - 4.0 K/uL   Monocytes Relative 10 %   Monocytes Absolute 0.7 0.1 - 1.0 K/uL   Eosinophils Relative 5 %   Eosinophils Absolute 0.3 0.0 - 0.7 K/uL   Basophils Relative 1 %   Basophils Absolute 0.0 0.0 - 0.1 K/uL    Comment: Performed at [facility], [address],  [place], [place] [address]  Troponin I     Status: None   Collection Time: 03/29/18  1:37 PM  Result Value Ref Range   Troponin I <0.03 <0.03 ng/mL    Comment: Performed at [facility], [address], [place], [place] [address]  Basic metabolic panel     Status: None   Collection Time: 03/29/18  1:37 PM  Result Value Ref Range   Sodium 142 135 - 145 mmol/L   Potassium 4.2 3.5 - 5.1 mmol/L   Chloride 104 101 - 111 mmol/L   CO2 28 22 - 32 mmol/L   Glucose, Bld 98 65 - 99 mg/dL   BUN 17 6 - 20 mg/dL   Creatinine, Ser 0.89 0.44 - 1.00 mg/dL   Calcium 9.5 8.9 - 10.3 mg/dL   GFR calc non Af Amer >60 >60 mL/min   GFR calc Af Amer >60 >60 mL/min    Comment: (NOTE) The eGFR has been calculated using the CKD EPI equation. This calculation has not been validated in all clinical situations. eGFR's persistently <60 mL/min signify possible Chronic Kidney Disease.    Anion gap 10 5 - 15    Comment: Performed at [facility], [address], [place], [place] [address]    ABGS No results for  input(s): PHART, PO2ART, TCO2, HCO3 in the last 72 hours.  Invalid input(s): PCO2 CULTURES No results found for this or any previous visit (from the past 240 hour(s)). Studies/Results: Dg Chest 2 View  Result Date: 03/29/2018 CLINICAL DATA:  Short of breath, smoker EXAM: CHEST - 2 VIEW COMPARISON:  03/27/2018 FINDINGS: COPD with hyperinflation and pulmonary scarring. No superimposed infiltrate or effusion. Negative for heart failure or effusion. No mass lesion. IMPRESSION: COPD with scarring.  No acute abnormality. Electronically Signed   By: Franchot Gallo M.D.   On: 03/29/2018 13:44    Medications:  Prior to Admission:  Medications Prior to Admission  Medication Sig Dispense Refill Last Dose  . albuterol (PROVENTIL HFA;VENTOLIN HFA) 108 (90 BASE) MCG/ACT inhaler Inhale 2 puffs into the lungs every 6 (six) hours as needed for wheezing or shortness of breath. 1 Inhaler 1 Past Week at Unknown time  .  albuterol (PROVENTIL) (2.5 MG/3ML) 0.083% nebulizer solution Take 3 mLs (2.5 mg total) by nebulization every 4 (four) hours as needed for wheezing or shortness of breath. 45 mL 1 unknown  . dexamethasone (DECADRON) 4 MG tablet Take 1 tablet (4 mg total) by mouth 2 (two) times daily with a meal. (Patient not taking: Reported on 02/02/2018) 10 tablet 0 Not Taking at Unknown time  . lidocaine (LIDODERM) 5 % Place 1 patch onto the skin daily. Remove & Discard patch within 12 hours or as directed by MD (Patient not taking: Reported on 02/02/2018) 30 patch 0 Not Taking at Unknown time  . naproxen (NAPROSYN) 500 MG tablet Take 1 tablet (500 mg total) by mouth 2 (two) times daily. (Patient not taking: Reported on 02/02/2018) 30 tablet 0 Not Taking at Unknown time  . traMADol (ULTRAM) 50 MG tablet Take 1 tablet (50 mg total) by mouth every 6 (six) hours as needed. (Patient not taking: Reported on 02/02/2018) 12 tablet 0 Not Taking at Unknown time   Scheduled: . guaiFENesin  600 mg Oral BID  . ipratropium-albuterol  3 mL Nebulization BID  . methylPREDNISolone (SOLU-MEDROL) injection  40 mg Intravenous Q12H   Continuous: . sodium chloride 100 mL/hr at 03/29/18 1909  . azithromycin Stopped (03/29/18 2321)   KBT:CYELYHTMB  Assesment: She was admitted with COPD exacerbation and that is much improved.  She seems to be back up almost to her baseline today.  She has esophageal dysphagia and GI consultation has been requested.  She has lost weight.  Some of this seems to be related to grief/depression but she does not want to take medications Principal Problem:   COPD with acute exacerbation ([place]) Active Problems:   Esophageal dysphagia    Plan: Continue treatments.  GI consultation.    LOS: 1 day   Katelyn [name] L 03/30/2018, 8:24 AM

## 2018-03-30 NOTE — Transfer of Care (Signed)
Immediate Anesthesia Transfer of Care Note  Patient: Katelyn [name]  Procedure(s) Performed: ESOPHAGOGASTRODUODENOSCOPY (EGD) WITH PROPOFOL (N/A ) BIOPSY MALONEY DILATION  Patient Location: PACU  Anesthesia Type:MAC  Level of Consciousness: awake, alert  and patient cooperative  Airway & Oxygen Therapy: Patient Spontanous Breathing and Patient connected to nasal cannula oxygen  Post-op Assessment: Report given to RN and Post -op Vital signs reviewed and stable  Post vital signs: Reviewed and stable  Last Vitals:  Vitals Value Taken Time  BP    Temp    Pulse    Resp 17 03/30/2018  4:19 PM  SpO2    Vitals shown include unvalidated device data.  Last Pain:  Vitals:   03/30/18 1554  TempSrc:   PainSc: 0-No pain      Patients Stated Pain Goal: 8 (74/[id number]/49 [id number])  Complications: No apparent anesthesia complications

## 2018-03-30 NOTE — Anesthesia Procedure Notes (Signed)
Procedure Name: MAC Date/Time: 03/30/2018 3:52 PM Performed by: Vista Deck, CRNA Pre-anesthesia Checklist: Patient identified, Emergency Drugs available, Suction available, Timeout performed and Patient being monitored Patient Re-evaluated:Patient Re-evaluated prior to induction Oxygen Delivery Method: Non-rebreather mask

## 2018-03-30 NOTE — Consult Note (Addendum)
Referring Provider: Dr. Denton Brick  Primary Care Physician:  Sinda Du, MD Primary Gastroenterologist:  Dr. Gala Romney   Date of Admission: 03/29/18 Date of Consultation: 03/30/18  Reason for Consultation:  Dysphagia   HPI:  Katelyn [name] is a 77 y.o. year old female last seen by [facility] in 2016. She has a history of dysphagia s/p multiple dilations in the past, most recently 2016. Empiric dilation in 2016 with modest improvement. No prior BPE. She has not been seen since that time. Presented to the ED yesterday with reported shortness of breath, productive cough, and associated wheezing per notes. Incidentally also noted solid food and liquid dysphagia. Admitted with COPD exacerbation and started on antibiotic therapy.   No PPI as outpatient. States she has done well since 2016. Has done well with empiric dilations in the past. Upon further discussion with patient, she states she felt like her throat was tight and denies any shortness of breath. Has dry cough currently. States that she "always" has a cough. Feels she is at her baseline respiratory status. For a few weeks, had worsening difficulty with drinking liquids and solid foods but worsening over past few days. Stopped solid foods and went to liquids but still had issues. No vomiting. Her baseline weight 100-110 and now 93. Believes she has lost this in the last month in the setting of decreased oral intake and dysphagia.  No odynophagia. Has a globus sensation. No abdominal pain. Has chronic GERD. Doesn't like to take medication, states "I am not a medicine person".   No lower GI concerns.   Past Medical History:  Diagnosis Date  . Asthma   . Chronic neck pain   . Chronic pain of left elbow   . Complication of anesthesia    hard to wake  . COPD (chronic obstructive pulmonary disease) ([place])   . Dysphagia    requiring multiple dilations in past  . Irregular heart beat   . S/P colonoscopy 2009   dr. Gala Romney: anal papilla, 2 cecal AVMs. Due  in 2014  . S/P endoscopy 2012   Dr. Gala Romney: mild chronic gastritis, bulbar erosions, s/p 54-French Maloney dilation   . S/P endoscopy 2003, 2004, 2007   gastritis, duodenitis, s/p dilation, +H.pylori     Past Surgical History:  Procedure Laterality Date  . APPENDECTOMY    . CHOLECYSTECTOMY    . COLONOSCOPY  10/10/2008   RMR: 1. Anal papilla, otherwise normal rectum. 2. Two cecal arteriovenous malformations, colonic mucosa apperaed normal.   . COLONOSCOPY N/A 05/10/2015   RMR: Rectal and colonic polyps removed as described above. Ascending colon AVMs . Rectal polyps were tubular adenomas. Next colonoscopy May 2021.  . ESOPHAGEAL DILATION N/A 05/10/2015   Procedure: ESOPHAGEAL DILATION;  Surgeon: Daneil Dolin, MD;  Location: [facility];  Service: Endoscopy;  Laterality: N/A;  . ESOPHAGOGASTRODUODENOSCOPY  04/02/2011   RMR: 1. Endoscopically normal -appearing esophagus status post passage of a Maloney dilator followed by biopsy. 2. Hiatal hernia, Mottling, and submucosal petechiae in teh gastric mucoas of uncertain significance status post biopsy. 3. Bulbar erosions as described above.   . ESOPHAGOGASTRODUODENOSCOPY N/A 05/10/2015   RMR: Normal esophagus status post Maloney dilation. Small hiatal hernia. Abnormal gastric because of doubtful clinical significance status post biopsy (benign)  . INGUINAL HERNIA REPAIR    . TONSILLECTOMY    . TOTAL ABDOMINAL HYSTERECTOMY      Prior to Admission medications   Medication Sig Start Date End Date Taking? Authorizing Provider  albuterol (PROVENTIL HFA;VENTOLIN HFA) 108 (90 BASE) MCG/ACT inhaler Inhale 2 puffs into the lungs every 6 (six) hours as needed for wheezing or shortness of breath. 09/04/15  Yes [name], MD  albuterol (PROVENTIL) (2.5 MG/3ML) 0.083% nebulizer solution Take 3 mLs (2.5 mg total) by nebulization every 4 (four) hours as needed for wheezing or shortness of breath. 09/04/15  Yes [name], MD  dexamethasone (DECADRON) 4  MG tablet Take 1 tablet (4 mg total) by mouth 2 (two) times daily with a meal. Patient not taking: Reported on 02/02/2018 12/01/17   Lily Kocher, PA-C  lidocaine (LIDODERM) 5 % Place 1 patch onto the skin daily. Remove & Discard patch within 12 hours or as directed by MD Patient not taking: Reported on 02/02/2018 12/15/17   Petrucelli, Aldona Bar R, PA-C  naproxen (NAPROSYN) 500 MG tablet Take 1 tablet (500 mg total) by mouth 2 (two) times daily. Patient not taking: Reported on 02/02/2018 12/15/17   Petrucelli, Glynda Jaeger, PA-C  traMADol (ULTRAM) 50 MG tablet Take 1 tablet (50 mg total) by mouth every 6 (six) hours as needed. Patient not taking: Reported on 02/02/2018 12/01/17   Lily Kocher, PA-C    Current Facility-Administered Medications  Medication Dose Route Frequency Provider Last Rate Last Dose  . 0.9 %  sodium chloride infusion   Intravenous Continuous Emokpae, Ejiroghene E, MD 100 mL/hr at 03/29/18 1909    . albuterol (PROVENTIL) (2.5 MG/3ML) 0.083% nebulizer solution 5 mg  5 mg Nebulization Q2H PRN Emokpae, Ejiroghene E, MD      . azithromycin (ZITHROMAX) 500 mg in sodium chloride 0.9 % 250 mL IVPB  500 mg Intravenous Q24H Emokpae, Ejiroghene E, MD   Stopped at 03/29/18 2321  . guaiFENesin (MUCINEX) 12 hr tablet 600 mg  600 mg Oral BID Emokpae, Ejiroghene E, MD   600 mg at 03/29/18 2133  . ipratropium-albuterol (DUONEB) 0.5-2.5 (3) MG/3ML nebulizer solution 3 mL  3 mL Nebulization BID Sinda Du, MD      . methylPREDNISolone sodium succinate (SOLU-MEDROL) 40 mg/mL injection 40 mg  40 mg Intravenous Q12H Emokpae, Ejiroghene E, MD   40 mg at 03/30/18 0849    Allergies as of 03/29/2018  . (No Known Allergies)    Family History  Problem Relation Age of Onset  . Heart disease Mother   . Lung cancer Father   . Colon cancer Neg Hx     Social History   Socioeconomic History  . Marital status: Widowed    Spouse name: Not on file  . Number of children: Not on file  . Years  of education: Not on file  . Highest education level: Not on file  Occupational History  . Not on file  Social Needs  . Financial resource strain: Not on file  . Food insecurity:    Worry: Not on file    Inability: Not on file  . Transportation needs:    Medical: Not on file    Non-medical: Not on file  Tobacco Use  . Smoking status: Current Every Day Smoker    Packs/day: 1.00    Years: 57.00    Pack years: 57.00    Types: Cigarettes    Start date: 08/18/1959  . Smokeless tobacco: Never Used  Substance and Sexual Activity  . Alcohol use: No    Alcohol/week: 0.0 oz  . Drug use: No  . Sexual activity: Not on file  Lifestyle  . Physical activity:    Days per week: Not on file  Minutes per session: Not on file  . Stress: Not on file  Relationships  . Social connections:    Talks on phone: Not on file    Gets together: Not on file    Attends religious service: Not on file    Active member of club or organization: Not on file    Attends meetings of clubs or organizations: Not on file    Relationship status: Not on file  . Intimate partner violence:    Fear of current or ex partner: Not on file    Emotionally abused: Not on file    Physically abused: Not on file    Forced sexual activity: Not on file  Other Topics Concern  . Not on file  Social History Narrative  . Not on file    Review of Systems: Gen: see HPI  CV: Denies chest pain, heart palpitations, syncope, edema  Resp: see HPI  GI: see HPI  GU : Denies urinary burning, urinary frequency, urinary incontinence.  MS: Denies joint pain,swelling, cramping Derm: Denies rash, itching, dry skin Psych: Denies depression, anxiety,confusion, or memory loss Heme: Denies bruising, bleeding, and enlarged lymph nodes.  Physical Exam: Vital signs in last 24 hours: Temp:  [97.9 F (36.6 C)-98.3 F (36.8 C)] 97.9 F (36.6 C) (04/08 0500) Pulse Rate:  [60-107] 61 (04/08 0500) Resp:  [15-26] 17 (04/08 0500) BP:  (89-131)/(54-95) 111/62 (04/08 0500) SpO2:  [87 %-100 %] 97 % (04/08 0821) Weight:  [93 lb (42.2 kg)-93 lb 0.6 oz (42.2 kg)] 93 lb 0.6 oz (42.2 kg) (04/07 1825) Last BM Date: 03/29/18 General:   Alert,  Well-developed, well-nourished, pleasant and cooperative in NAD Head:  Normocephalic and atraumatic. Eyes:  Sclera clear, no icterus.   Conjunctiva pink. Ears:  Normal auditory acuity. Nose:  No deformity, discharge,  or lesions. Mouth:  Possible oral thrush Lungs:  Diminished bases, no wheezing, mild scattered rhonchi Heart:  S1 S2 present without obvious murmurs  Abdomen:  Soft, nontender and nondistended. No masses, hepatosplenomegaly or hernias noted. Normal bowel sounds, without guarding, and without rebound.   Rectal:  Deferred  Msk:  Symmetrical without gross deformities. Normal posture. Extremities:  Without edema. Neurologic:  Alert and  oriented x4. Psych:  Alert and cooperative. Normal mood and affect.  Intake/Output from previous day: 04/07 0701 - 04/08 0700 In: 240 [P.O.:240] Out: 450 [Urine:450] Intake/Output this shift: No intake/output data recorded.  Lab Results: Recent Labs    03/27/18 1037 03/29/18 1337  WBC 6.9 6.9  HGB 11.5* 11.8*  HCT 36.9 37.7  PLT 270 305   BMET Recent Labs    03/27/18 1037 03/29/18 1337  NA 141 142  K 4.4 4.2  CL 102 104  CO2 28 28  GLUCOSE 94 98  BUN 10 17  CREATININE 0.78 0.89  CALCIUM 9.5 9.5    Studies/Results: Dg Chest 2 View  Result Date: 03/29/2018 CLINICAL DATA:  Short of breath, smoker EXAM: CHEST - 2 VIEW COMPARISON:  03/27/2018 FINDINGS: COPD with hyperinflation and pulmonary scarring. No superimposed infiltrate or effusion. Negative for heart failure or effusion. No mass lesion. IMPRESSION: COPD with scarring.  No acute abnormality. Electronically Signed   By: Franchot Gallo M.D.   On: 03/29/2018 13:44    Impression: 77 year old female admitted with COPD exacerbation and solid/liquid food dysphagia that has  been worsening over the past few weeks. She is back to her baseline respiratory status. History of multiple empiric dilations in the past with symptomatic improvement, last  in 2016. She notes associated weight loss over the past month in the setting of decreased oral intake secondary to dysphagia. Query oral thrush on exam. She has been NPO since midnight. From a respiratory status, she is stable for EGD with dilation today.   Proceed with upper endoscopy today, 03/30/18 with Dr. Gala Romney. The risks, benefits, and alternatives have been discussed in detail with patient. They have stated understanding and desire to proceed. Will utilize Propofol.  If EGD unrevealing and persistent dysphagia in the future, recommend BPE as outpatient.   Normocytic anemia: mild, with Hgb 11.8 and at baseline. No overt GI bleeding. Likely chronic disease. Colonoscopy due in 2021 barring any clinical changes. Can follow as outpatient.    Plan: Remain NPO EGD/dilation with Dr. Gala Romney today, 03/30/18, with Propofol BPE as outpatient if persistent dysphagia and EGD unrevealing   Annitta Needs, PhD, ANP-BC [facility]     LOS: 1 day    03/30/2018, 9:41 AM

## 2018-03-30 NOTE — Anesthesia Postprocedure Evaluation (Signed)
Anesthesia Post Note  Patient: Katelyn [name]  Procedure(s) Performed: ESOPHAGOGASTRODUODENOSCOPY (EGD) WITH PROPOFOL (N/A ) [place]  Patient location during evaluation: PACU Anesthesia Type: MAC Level of consciousness: awake and alert and patient cooperative Pain management: satisfactory to patient Vital Signs Assessment: post-procedure vital signs reviewed and stable Respiratory status: spontaneous breathing Cardiovascular status: stable Postop Assessment: no apparent nausea or vomiting Anesthetic complications: no     Last Vitals:  Vitals:   03/30/18 1630 03/30/18 1640  BP: 125/71 122/64  Pulse: 81 82  Resp: 19 18  Temp:    SpO2: 95% 95%    Last Pain:  Vitals:   03/30/18 1640  TempSrc:   PainSc: 0-No pain                 Margaret Staggs

## 2018-03-31 ENCOUNTER — Encounter: Payer: Self-pay | Admitting: Internal Medicine

## 2018-03-31 ENCOUNTER — Telehealth: Payer: Self-pay | Admitting: Gastroenterology

## 2018-03-31 DIAGNOSIS — K209 Esophagitis, unspecified without bleeding: Secondary | ICD-10-CM | POA: Diagnosis present

## 2018-03-31 DIAGNOSIS — K269 Duodenal ulcer, unspecified as acute or chronic, without hemorrhage or perforation: Secondary | ICD-10-CM | POA: Diagnosis present

## 2018-03-31 DIAGNOSIS — K2900 Acute gastritis without bleeding: Secondary | ICD-10-CM | POA: Diagnosis present

## 2018-03-31 MED ORDER — GUAIFENESIN ER 600 MG PO TB12: 600.0000 mg | ORAL_TABLET | Freq: Two times a day (BID) | ORAL | 5 refills | Status: DC

## 2018-03-31 MED ORDER — PANTOPRAZOLE SODIUM 40 MG PO TBEC: 40.0000 mg | DELAYED_RELEASE_TABLET | Freq: Every day | ORAL | 12 refills | Status: DC

## 2018-03-31 MED ORDER — PREDNISONE 20 MG PO TABS: 20.0000 mg | ORAL_TABLET | Freq: Every day | ORAL | 0 refills | Status: AC

## 2018-03-31 MED ORDER — AZITHROMYCIN 250 MG PO TABS: ORAL_TABLET | ORAL | 0 refills | Status: DC

## 2018-03-31 NOTE — Discharge Summary (Signed)
Physician Discharge Summary  Patient ID: Katelyn [name] MRN: [id number] DOB/AGE: 75/07/1941 75 y.o. Primary Care Physician:Katelyn [name], Katelyn Dredge, MD Admit date: 03/29/2018 Discharge date: 03/31/2018    Discharge Diagnoses:   Principal Problem:   COPD with acute exacerbation [facility]) Active Problems:   Dysphagia   Gastritis, chronic   Mucosal abnormality of stomach   Goals of care, counseling/discussion   DNR (do not resuscitate) discussion   Palliative care by specialist   Acute gastritis   Esophagitis   Duodenal ulcer   Allergies as of 03/31/2018   No Known Allergies     Medication List    STOP taking these medications   dexamethasone 4 MG tablet Commonly known as:  DECADRON   lidocaine 5 % Commonly known as:  LIDODERM   naproxen 500 MG tablet Commonly known as:  NAPROSYN   traMADol 50 MG tablet Commonly known as:  ULTRAM     TAKE these medications   albuterol (2.5 MG/3ML) 0.083% nebulizer solution Commonly known as:  PROVENTIL Take 3 mLs (2.5 mg total) by nebulization every 4 (four) hours as needed for wheezing or shortness of breath.   albuterol 108 (90 Base) MCG/ACT inhaler Commonly known as:  PROVENTIL HFA;VENTOLIN HFA Inhale 2 puffs into the lungs every 6 (six) hours as needed for wheezing or shortness of breath.   azithromycin 250 MG tablet Commonly known as:  ZITHROMAX Z-PAK Take by package instructions   guaiFENesin 600 MG 12 hr tablet Commonly known as:  MUCINEX Take 1 tablet (600 mg total) by mouth 2 (two) times daily.   pantoprazole 40 MG tablet Commonly known as:  PROTONIX Take 1 tablet (40 mg total) by mouth daily.   predniSONE 20 MG tablet Commonly known as:  DELTASONE Take 1 tablet (20 mg total) by mouth daily for 5 days.       Discharged Condition: Improved    Consults: Gastroenterology Dr. Jena [name]  Significant Diagnostic Studies: Dg Chest 2 View  Result Date: 03/29/2018 CLINICAL DATA:  Short of breath, smoker EXAM: CHEST - 2 VIEW  COMPARISON:  03/27/2018 FINDINGS: COPD with hyperinflation and pulmonary scarring. No superimposed infiltrate or effusion. Negative for heart failure or effusion. No mass lesion. IMPRESSION: COPD with scarring.  No acute abnormality. Electronically Signed   By: Katelyn [name] M.D.   On: 03/29/2018 13:44   Dg Chest 2 View  Result Date: 03/27/2018 CLINICAL DATA:  Intermittent shortness of breath for a couple of weeks, worse last night. EXAM: CHEST - 2 VIEW COMPARISON:  02/02/2018 FINDINGS: The cardiomediastinal silhouette is within normal limits. The lungs remain hyperinflated. No airspace consolidation, edema, pleural effusion, or pneumothorax is identified. No acute osseous abnormality is identified. IMPRESSION: COPD without evidence of active cardiopulmonary disease. Electronically Signed   By: Katelyn [name] M.D.   On: 03/27/2018 12:12    Lab Results: Basic Metabolic Panel: Recent Labs    03/29/18 1337  NA 142  K 4.2  CL 104  CO2 28  GLUCOSE 98  BUN 17  CREATININE 0.89  CALCIUM 9.5   Liver Function Tests: No results for input(s): AST, ALT, ALKPHOS, BILITOT, PROT, ALBUMIN in the last 72 hours.   CBC: Recent Labs    03/29/18 1337  WBC 6.9  NEUTROABS 2.9  HGB 11.8*  HCT 37.7  MCV 84.3  PLT 305    No results found for this or any previous visit (from the past 240 hour(s)).   Hospital Course: This is a 77 year old who came to the emergency department because  of difficulty swallowing and COPD exacerbation.  She got to where she could not swallow at all.  She has had previous issues with dysphagia.  She says she was having increasing shortness of breath.  She was admitted with COPD exacerbation and dysphagia.  She had consultation with GI and underwent EGD with dilatation.  She was found to have esophagitis gastritis and a duodenal ulcer.  Her breathing was better.  She was able to swallow.  She was back at baseline at the time of discharge  Discharge Exam: Blood pressure 103/66,  pulse (!) 55, temperature 97.8 F (36.6 C), temperature source Oral, resp. rate 18, height 5\' 4"  (1.626 m), weight 42.2 kg (93 lb 0.6 oz), SpO2 96 %. She is awake and alert.  Chest is clear.  She is very thin.  Disposition: Home she will take Z-Pak and 5 days of steroids.  She will be on Protonix.  Await Helicobacter testing      Signed: [name] L   03/31/2018, 8:48 AM

## 2018-03-31 NOTE — Progress Notes (Signed)
Katelyn [name] discharged Home per MD order.  Discharge instructions reviewed and discussed with the patient, all questions and concerns answered. Copy of instructions and scripts given to patient.  Allergies as of 03/31/2018   No Known Allergies     Medication List    STOP taking these medications   dexamethasone 4 MG tablet Commonly known as:  DECADRON   lidocaine 5 % Commonly known as:  LIDODERM   naproxen 500 MG tablet Commonly known as:  NAPROSYN   traMADol 50 MG tablet Commonly known as:  ULTRAM     TAKE these medications   albuterol (2.5 MG/3ML) 0.083% nebulizer solution Commonly known as:  PROVENTIL Take 3 mLs (2.5 mg total) by nebulization every 4 (four) hours as needed for wheezing or shortness of breath.   albuterol 108 (90 Base) MCG/ACT inhaler Commonly known as:  PROVENTIL HFA;VENTOLIN HFA Inhale 2 puffs into the lungs every 6 (six) hours as needed for wheezing or shortness of breath.   azithromycin 250 MG tablet Commonly known as:  ZITHROMAX Z-PAK Take by package instructions   guaiFENesin 600 MG 12 hr tablet Commonly known as:  MUCINEX Take 1 tablet (600 mg total) by mouth 2 (two) times daily.   pantoprazole 40 MG tablet Commonly known as:  PROTONIX Take 1 tablet (40 mg total) by mouth daily.   predniSONE 20 MG tablet Commonly known as:  DELTASONE Take 1 tablet (20 mg total) by mouth daily for 5 days.       Patients skin is clean, dry and intact, no evidence of skin break down. IV site discontinued and catheter remains intact. Site without signs and symptoms of complications. Dressing and pressure applied.  Patient ambulated to the elevator.  no distress noted upon discharge.  Ralene Muskrat Kirbi Farrugia 03/31/2018 11:44 AM

## 2018-03-31 NOTE — Progress Notes (Signed)
Pt states she can't find her insurance card and ID. She remembers giving it to registration down in the ED but does not remember getting it back. Called down to the ED, but staff was unable to locate pt's belongings. Pt states she will drop by registration on her way out of the hospital.

## 2018-03-31 NOTE — Telephone Encounter (Signed)
Please arrange hospitalization follow-up in 6-8 weeks. Hx of dysphagia. Thanks!

## 2018-03-31 NOTE — Progress Notes (Signed)
  EGD 4/8 mild erosive reflux esophagitis, s/p dilation, small hiatal hernia, erosive gastropathy s/p biopsy, duodenal erosions/AVM. Discharge orders have been placed. Patient not physically seen today, but chart was reviewed. Continue PPI therapy as outpatient. Will arrange outpatient follow-up.   Annitta Needs, PhD, ANP-BC [facility]

## 2018-03-31 NOTE — Telephone Encounter (Signed)
PATIENT SCHEDULED AND LETTER SENT  °

## 2018-03-31 NOTE — Care Management Note (Signed)
Case Management Note  Patient Details  Name: Katelyn [name] MRN: [id number] Date of Birth: 1941-09-15  Subjective/Objective:     Admitted with COPD and dysphagia.   Pt from home, ind, drives, has PCP, insurance with drug coverage. Very active. Pt has neb machine pta.   Pt seen by GI, had EGD with dilation.           Action/Plan: DC home today with self care. CM discussed COPD pilot with patient. At this time pt is not homebound and not interested in [facility] services. We talk about her multiple admissions. She believes now that she knows it is her "throat causing her problems and now that we know it she doesn't think there will be any more issues.   Expected Discharge Date:  03/31/18               Expected Discharge Plan:  Home/Self Care  In-House Referral:  NA  Discharge planning Services  CM Consult  Post Acute Care Choice:  NA Choice offered to:  NA  Status of Service:  Completed, signed off  Sherald Barge, RN 03/31/2018, 9:20 AM

## 2018-03-31 NOTE — ED Provider Notes (Signed)
[facility] EMERGENCY DEPARTMENT Provider Note   CSN: [id number] Arrival date & time: 03/27/18  1010     History   Chief Complaint Chief Complaint  Patient presents with  . Shortness of Breath    HPI Katelyn [name] is a 77 y.o. female.  HPI   77 year old female with sore throat and some dysphasia.  She feels like she has had some difficulty swallowing although she gets stuff down.  Mild sore throat.  She does feel short of breath but the reports that this is baseline.  No fevers or chills.  She is a smoker.  No unusual leg pain or swelling.  Past Medical History:  Diagnosis Date  . Asthma   . Chronic neck pain   . Chronic pain of left elbow   . Complication of anesthesia    hard to wake  . COPD (chronic obstructive pulmonary disease) ([place])   . Dysphagia    requiring multiple dilations in past  . Headache    Patient states she needs  eyeglasses  . Irregular heart beat   . S/P colonoscopy 2009   dr. Gala Romney: anal papilla, 2 cecal AVMs. Due in 2014  . S/P endoscopy 2012   Dr. Gala Romney: mild chronic gastritis, bulbar erosions, s/p 54-French Maloney dilation   . S/P endoscopy 2003, 2004, 2007   gastritis, duodenitis, s/p dilation, +H.pylori     Patient Active Problem List   Diagnosis Date Noted  . Acute gastritis 03/31/2018  . Esophagitis 03/31/2018  . Duodenal ulcer 03/31/2018  . Goals of care, counseling/discussion   . DNR (do not resuscitate) discussion   . Palliative care by specialist   . COPD with acute exacerbation ([place]) 03/29/2018  . COPD (chronic obstructive pulmonary disease) ([place]) 02/03/2018  . Hypoxia 11/28/2015  . COPD exacerbation ([place]) 11/28/2015  . Acute respiratory failure with hypoxia ([place]) 11/28/2015  . SOB (shortness of breath) 11/28/2015  . Mucosal abnormality of stomach   . History of colonic polyps 04/25/2015  . 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 Dysphagia 03/11/2011    Past Surgical History:  Procedure Laterality Date  . APPENDECTOMY    .  CHOLECYSTECTOMY    . COLONOSCOPY  10/10/2008   RMR: 1. Anal papilla, otherwise normal rectum. 2. Two cecal arteriovenous malformations, colonic mucosa apperaed normal.   . COLONOSCOPY N/A 05/10/2015   RMR: Rectal and colonic polyps removed as described above. Ascending colon AVMs . Rectal polyps were tubular adenomas. Next colonoscopy May 2021.  . ESOPHAGEAL DILATION N/A 05/10/2015   Procedure: ESOPHAGEAL DILATION;  Surgeon: Daneil Dolin, MD;  Location: [facility];  Service: Endoscopy;  Laterality: N/A;  . ESOPHAGOGASTRODUODENOSCOPY  04/02/2011   RMR: 1. Endoscopically normal -appearing esophagus status post passage of a Maloney dilator followed by biopsy. 2. Hiatal hernia, Mottling, and submucosal petechiae in teh gastric mucoas of uncertain significance status post biopsy. 3. Bulbar erosions as described above.   . ESOPHAGOGASTRODUODENOSCOPY N/A 05/10/2015   RMR: Normal esophagus status post Maloney dilation. Small hiatal hernia. Abnormal gastric because of doubtful clinical significance status post biopsy (benign)  . INGUINAL HERNIA REPAIR    . TONSILLECTOMY    . TOTAL ABDOMINAL HYSTERECTOMY       OB History    Gravida  5   Para  5   Term  4   Preterm  1   AB      Living  4     SAB      TAB  Ectopic      Multiple      Live Births               Home Medications    Prior to Admission medications   Medication Sig Start Date End Date Taking? Authorizing Provider  albuterol (PROVENTIL HFA;VENTOLIN HFA) 108 (90 BASE) MCG/ACT inhaler Inhale 2 puffs into the lungs every 6 (six) hours as needed for wheezing or shortness of breath. 09/04/15   [name], MD  albuterol (PROVENTIL) (2.5 MG/3ML) 0.083% nebulizer solution Take 3 mLs (2.5 mg total) by nebulization every 4 (four) hours as needed for wheezing or shortness of breath. 09/04/15   [name], MD  azithromycin (ZITHROMAX Z-PAK) 250 MG tablet Take by package instructions 03/31/18   Sinda Du, MD    dexamethasone (DECADRON) 4 MG tablet Take 1 tablet (4 mg total) by mouth 2 (two) times daily with a meal. Patient not taking: Reported on 02/02/2018 12/01/17   Lily Kocher, PA-C  guaiFENesin (MUCINEX) 600 MG 12 hr tablet Take 1 tablet (600 mg total) by mouth 2 (two) times daily. 03/31/18   Sinda Du, MD  lidocaine (LIDODERM) 5 % Place 1 patch onto the skin daily. Remove & Discard patch within 12 hours or as directed by MD Patient not taking: Reported on 02/02/2018 12/15/17   Petrucelli, Aldona Bar R, PA-C  naproxen (NAPROSYN) 500 MG tablet Take 1 tablet (500 mg total) by mouth 2 (two) times daily. Patient not taking: Reported on 02/02/2018 12/15/17   Petrucelli, Glynda Jaeger, PA-C  pantoprazole (PROTONIX) 40 MG tablet Take 1 tablet (40 mg total) by mouth daily. 03/31/18   Sinda Du, MD  predniSONE (DELTASONE) 20 MG tablet Take 1 tablet (20 mg total) by mouth daily for 5 days. 03/31/18 04/05/18  Sinda Du, MD  traMADol (ULTRAM) 50 MG tablet Take 1 tablet (50 mg total) by mouth every 6 (six) hours as needed. Patient not taking: Reported on 02/02/2018 12/01/17   Lily Kocher, PA-C    Family History Family History  Problem Relation Age of Onset  . Heart disease Mother   . Lung cancer Father   . Cancer Sister   . Diabetes Brother   . Colon cancer Neg Hx     Social History Social History   Tobacco Use  . Smoking status: Current Every Day Smoker    Packs/day: 1.00    Years: 57.00    Pack years: 57.00    Types: Cigarettes    Start date: 08/18/1959  . Smokeless tobacco: Never Used  Substance Use Topics  . Alcohol use: No    Alcohol/week: 0.0 oz  . Drug use: No     Allergies   Patient has no known allergies.   Review of Systems Review of Systems   Physical Exam Updated Vital Signs BP 121/68   Pulse 62   Temp 98.4 F (36.9 C) (Oral)   Resp 18   Ht 5\' 4"  (1.626 m)   Wt 42.2 kg (93 lb)   SpO2 100%   BMI 15.96 kg/m   Physical Exam  Constitutional: She appears  well-developed and well-nourished. No distress.  HENT:  Head: Normocephalic and atraumatic.  Oropharynx is clear.  Uvula midline.  Normal sounding voice.  Neck is supple.  No adenopathy.  Eyes: Conjunctivae are normal. Right eye exhibits no discharge. Left eye exhibits no discharge.  Neck: Neck supple.  Cardiovascular: Normal rate, regular rhythm and normal heart sounds. Exam reveals no gallop and no friction rub.  No murmur  heard. Pulmonary/Chest: No respiratory distress. She has wheezes.  Mild tachypnea.  Wheezing.   can speak in complete sentences.  Abdominal: Soft. She exhibits no distension. There is no tenderness.  Musculoskeletal: She exhibits no edema or tenderness.  Neurological: She is alert.  Skin: Skin is warm and dry.  Psychiatric: She has a normal mood and affect. Her behavior is normal. Thought content normal.  Nursing note and vitals reviewed.    ED Treatments / Results  Labs (all labs ordered are listed, but only abnormal results are displayed) Labs Reviewed  CBC - Abnormal; Notable for the following components:      Result Value   Hemoglobin 11.5 (*)    RDW 16.2 (*)    All other components within normal limits  BASIC METABOLIC PANEL    EKG EKG Interpretation  Date/Time:  Friday March 27 2018 10:25:03 EDT Ventricular Rate:  76 PR Interval:    QRS Duration: 113 QT Interval:  395 QTC Calculation: 442 R Axis:   -57 Text Interpretation:  Sinus rhythm wander in multiple leads RSR' in V1 or V2, right VCD or RVH Confirmed by Virgel Manifold [phone]) on 03/27/2018 11:42:13 AM   Radiology Dg Chest 2 View  Result Date: 03/29/2018 CLINICAL DATA:  Short of breath, smoker EXAM: CHEST - 2 VIEW COMPARISON:  03/27/2018 FINDINGS: COPD with hyperinflation and pulmonary scarring. No superimposed infiltrate or effusion. Negative for heart failure or effusion. No mass lesion. IMPRESSION: COPD with scarring.  No acute abnormality. Electronically Signed   By: Franchot Gallo M.D.    On: 03/29/2018 13:44    Procedures Procedures (including critical care time)  Medications Ordered in ED Medications  ipratropium-albuterol (DUONEB) 0.5-2.5 (3) MG/3ML nebulizer solution 3 mL (3 mLs Nebulization Given 03/27/18 1041)  dexamethasone (DECADRON) tablet 8 mg (8 mg Oral Given 03/27/18 1222)     Initial Impression / Assessment and Plan / ED Course  I have reviewed the triage vital signs and the nursing notes.  Pertinent labs & imaging results that were available during my care of the patient were reviewed by me and considered in my medical decision making (see chart for details).     77 year old female with some dysphasia/odynophagia.  Oropharynx looks clear to me.  Her neck is supple.  There is no stridor.  She is afebrile.  Never low suspicion for serious deep space head/neck infection.  She reports some difficulty swallowing.  I am unsure if this is secondary to pain versus a mechanical issue.  She can get food down though.  She is wheezing on exam with some mild tachypnea.  Oxygen saturations are adequate though.  She was given a dose of Decadron for both her breathing and for her throat.  Outpatient follow-up.  Final Clinical Impressions(s) / ED Diagnoses   Final diagnoses:  Dyspnea, unspecified type  Dysphagia, unspecified type    ED Discharge Orders    None       Virgel Manifold, MD 03/31/18 [phone]

## 2018-03-31 NOTE — Progress Notes (Signed)
Subjective: She says she feels well.  No complaints.  She wants to go home.  Objective: Vital signs in last 24 hours: Temp:  [97.7 F (36.5 C)-98.2 F (36.8 C)] 97.8 F (36.6 C) (04/09 0628) Pulse Rate:  [55-92] 55 (04/09 0628) Resp:  [17-22] 18 (04/08 1640) BP: (103-134)/(63-84) 103/66 (04/09 0628) SpO2:  [94 %-100 %] 96 % (04/09 0818) Weight change:  Last BM Date: 03/30/18  Intake/Output from previous day: 04/08 0701 - 04/09 0700 In: 1200 [I.V.:700; IV Piggyback:500] Out: 0   PHYSICAL EXAM General appearance: alert, cooperative and no distress Resp: clear to auscultation bilaterally Cardio: regular rate and rhythm, S1, S2 normal, no murmur, click, rub or gallop GI: soft, non-tender; bowel sounds normal; no masses,  no organomegaly Extremities: extremities normal, atraumatic, no cyanosis or edema  Lab Results:  Results for orders placed or performed during the hospital encounter of 03/29/18 (from the past 48 hour(s))  CBC with Differential     Status: Abnormal   Collection Time: 03/29/18  1:37 PM  Result Value Ref Range   WBC 6.9 4.0 - 10.5 K/uL   RBC 4.47 3.87 - 5.11 MIL/uL   Hemoglobin 11.8 (L) 12.0 - 15.0 g/dL   HCT 37.7 36.0 - 46.0 %   MCV 84.3 78.0 - 100.0 fL   MCH 26.4 26.0 - 34.0 pg   MCHC 31.3 30.0 - 36.0 g/dL   RDW 16.3 (H) 11.5 - 15.5 %   Platelets 305 150 - 400 K/uL   Neutrophils Relative % 42 %   Neutro Abs 2.9 1.7 - 7.7 K/uL   Lymphocytes Relative 44 %   Lymphs Abs 3.0 0.7 - 4.0 K/uL   Monocytes Relative 10 %   Monocytes Absolute 0.7 0.1 - 1.0 K/uL   Eosinophils Relative 5 %   Eosinophils Absolute 0.3 0.0 - 0.7 K/uL   Basophils Relative 1 %   Basophils Absolute 0.0 0.0 - 0.1 K/uL    Comment: Performed at [facility], [address] [place], [place] [address]  Troponin I     Status: None   Collection Time: 03/29/18  1:37 PM  Result Value Ref Range   Troponin I <0.03 <0.03 ng/mL    Comment: Performed at [facility], [address],  [place], [place] [address]  Basic metabolic panel     Status: None   Collection Time: 03/29/18  1:37 PM  Result Value Ref Range   Sodium 142 135 - 145 mmol/L   Potassium 4.2 3.5 - 5.1 mmol/L   Chloride 104 101 - 111 mmol/L   CO2 28 22 - 32 mmol/L   Glucose, Bld 98 65 - 99 mg/dL   BUN 17 6 - 20 mg/dL   Creatinine, Ser 0.89 0.44 - 1.00 mg/dL   Calcium 9.5 8.9 - 10.3 mg/dL   GFR calc non Af Amer >60 >60 mL/min   GFR calc Af Amer >60 >60 mL/min    Comment: (NOTE) The eGFR has been calculated using the CKD EPI equation. This calculation has not been validated in all clinical situations. eGFR's persistently <60 mL/min signify possible Chronic Kidney Disease.    Anion gap 10 5 - 15    Comment: Performed at [facility], [address], [place],  [address]    ABGS No results for input(s): PHART, PO2ART, TCO2, HCO3 in the last 72 hours.  Invalid input(s): PCO2 CULTURES No results found for this or any previous visit (from the past 240 hour(s)). Studies/Results: Dg Chest 2 View  Result Date: 03/29/2018 CLINICAL DATA:  Short of breath, smoker EXAM: CHEST - 2 VIEW COMPARISON:  03/27/2018 FINDINGS: COPD with hyperinflation and pulmonary scarring. No superimposed infiltrate or effusion. Negative for heart failure or effusion. No mass lesion. IMPRESSION: COPD with scarring.  No acute abnormality. Electronically Signed   By: Franchot Gallo M.D.   On: 03/29/2018 13:44    Medications:  Prior to Admission:  Medications Prior to Admission  Medication Sig Dispense Refill Last Dose  . albuterol (PROVENTIL HFA;VENTOLIN HFA) 108 (90 BASE) MCG/ACT inhaler Inhale 2 puffs into the lungs every 6 (six) hours as needed for wheezing or shortness of breath. 1 Inhaler 1 Past Week at Unknown time  . albuterol (PROVENTIL) (2.5 MG/3ML) 0.083% nebulizer solution Take 3 mLs (2.5 mg total) by nebulization every 4 (four) hours as needed for wheezing or shortness of breath. 45 mL 1 unknown  . dexamethasone  (DECADRON) 4 MG tablet Take 1 tablet (4 mg total) by mouth 2 (two) times daily with a meal. (Patient not taking: Reported on 02/02/2018) 10 tablet 0 Not Taking at Unknown time  . lidocaine (LIDODERM) 5 % Place 1 patch onto the skin daily. Remove & Discard patch within 12 hours or as directed by MD (Patient not taking: Reported on 02/02/2018) 30 patch 0 Not Taking at Unknown time  . naproxen (NAPROSYN) 500 MG tablet Take 1 tablet (500 mg total) by mouth 2 (two) times daily. (Patient not taking: Reported on 02/02/2018) 30 tablet 0 Not Taking at Unknown time  . traMADol (ULTRAM) 50 MG tablet Take 1 tablet (50 mg total) by mouth every 6 (six) hours as needed. (Patient not taking: Reported on 02/02/2018) 12 tablet 0 Not Taking at Unknown time   Scheduled: . guaiFENesin  600 mg Oral BID  . ipratropium-albuterol  3 mL Nebulization BID  . methylPREDNISolone (SOLU-MEDROL) injection  40 mg Intravenous Q12H  . pantoprazole  40 mg Oral Daily   Continuous: . azithromycin Stopped (03/30/18 2117)   MBB:[id number]  Assesment: She was admitted with COPD exacerbation and dysphagia.  EGD yesterday showed esophagitis and gastritis and duodenal ulcer.  Her breathing is good.  She says she is able to swallow and she is ready to go home Principal Problem:   COPD with acute exacerbation [facility]) Active Problems:   Dysphagia   Goals of care, counseling/discussion   DNR (do not resuscitate) discussion   Palliative care by specialist    Plan: Discharge home today    LOS: 2 days   Oveda Dadamo L 03/31/2018, 8:39 AM

## 2018-04-02 ENCOUNTER — Encounter (HOSPITAL_COMMUNITY): Payer: Self-pay | Admitting: Internal Medicine

## 2018-04-05 ENCOUNTER — Encounter: Payer: Self-pay | Admitting: Internal Medicine

## 2018-04-13 ENCOUNTER — Inpatient Hospital Stay (HOSPITAL_COMMUNITY)
Admission: EM | Admit: 2018-04-13 | Discharge: 2018-04-16 | DRG: 190 | Disposition: A | Discharge status: Home / Self Care | Payer: Medicare Other | Attending: Pulmonary Disease | Admitting: Pulmonary Disease

## 2018-04-13 ENCOUNTER — Emergency Department (HOSPITAL_COMMUNITY): Payer: Medicare Other

## 2018-04-13 ENCOUNTER — Other Ambulatory Visit: Payer: Self-pay

## 2018-04-13 ENCOUNTER — Encounter (HOSPITAL_COMMUNITY): Payer: Self-pay | Admitting: Emergency Medicine

## 2018-04-13 DIAGNOSIS — J9622 Acute and chronic respiratory failure with hypercapnia: Secondary | ICD-10-CM | POA: Diagnosis not present

## 2018-04-13 DIAGNOSIS — R000 Tachycardia, unspecified: Secondary | ICD-10-CM

## 2018-04-13 DIAGNOSIS — G8929 Other chronic pain: Secondary | ICD-10-CM | POA: Diagnosis present

## 2018-04-13 DIAGNOSIS — R0602 Shortness of breath: Secondary | ICD-10-CM | POA: Diagnosis not present

## 2018-04-13 DIAGNOSIS — Z681 Body mass index (BMI) 19 or less, adult: Secondary | ICD-10-CM

## 2018-04-13 DIAGNOSIS — Z833 Family history of diabetes mellitus: Secondary | ICD-10-CM

## 2018-04-13 DIAGNOSIS — Z801 Family history of malignant neoplasm of trachea, bronchus and lung: Secondary | ICD-10-CM

## 2018-04-13 DIAGNOSIS — Z86010 Personal history of colonic polyps: Secondary | ICD-10-CM

## 2018-04-13 DIAGNOSIS — F17210 Nicotine dependence, cigarettes, uncomplicated: Secondary | ICD-10-CM | POA: Diagnosis present

## 2018-04-13 DIAGNOSIS — E46 Unspecified protein-calorie malnutrition: Secondary | ICD-10-CM | POA: Diagnosis not present

## 2018-04-13 DIAGNOSIS — J441 Chronic obstructive pulmonary disease with (acute) exacerbation: Principal | ICD-10-CM | POA: Diagnosis present

## 2018-04-13 DIAGNOSIS — R0789 Other chest pain: Secondary | ICD-10-CM | POA: Diagnosis not present

## 2018-04-13 DIAGNOSIS — K219 Gastro-esophageal reflux disease without esophagitis: Secondary | ICD-10-CM | POA: Diagnosis present

## 2018-04-13 DIAGNOSIS — Z9049 Acquired absence of other specified parts of digestive tract: Secondary | ICD-10-CM

## 2018-04-13 DIAGNOSIS — J9621 Acute and chronic respiratory failure with hypoxia: Secondary | ICD-10-CM | POA: Diagnosis present

## 2018-04-13 DIAGNOSIS — R1310 Dysphagia, unspecified: Secondary | ICD-10-CM | POA: Diagnosis present

## 2018-04-13 DIAGNOSIS — Z8249 Family history of ischemic heart disease and other diseases of the circulatory system: Secondary | ICD-10-CM

## 2018-04-13 DIAGNOSIS — M25522 Pain in left elbow: Secondary | ICD-10-CM | POA: Diagnosis present

## 2018-04-13 DIAGNOSIS — Z90710 Acquired absence of both cervix and uterus: Secondary | ICD-10-CM

## 2018-04-13 LAB — CBC
HCT: 36.3 % (ref 36.0–46.0)
HEMOGLOBIN: 11.4 g/dL — AB (ref 12.0–15.0)
MCH: 26.2 pg (ref 26.0–34.0)
MCHC: 31.4 g/dL (ref 30.0–36.0)
MCV: 83.4 fL (ref 78.0–100.0)
Platelets: 378 10*3/uL (ref 150–400)
RBC: 4.35 MIL/uL (ref 3.87–5.11)
RDW: 16.1 % — ABNORMAL HIGH (ref 11.5–15.5)
WBC: 9.8 10*3/uL (ref 4.0–10.5)

## 2018-04-13 LAB — BASIC METABOLIC PANEL
ANION GAP: 12 (ref 5–15)
BUN: 19 mg/dL (ref 6–20)
CALCIUM: 9.7 mg/dL (ref 8.9–10.3)
CO2: 28 mmol/L (ref 22–32)
Chloride: 101 mmol/L (ref 101–111)
Creatinine, Ser: 1.2 mg/dL — ABNORMAL HIGH (ref 0.44–1.00)
GFR calc Af Amer: 50 mL/min — ABNORMAL LOW (ref >60)
GFR, EST NON AFRICAN AMERICAN: 43 mL/min — AB (ref >60)
GLUCOSE: 105 mg/dL — AB (ref 65–99)
Potassium: 3.8 mmol/L (ref 3.5–5.1)
Sodium: 141 mmol/L (ref 135–145)

## 2018-04-13 MED ORDER — MAGNESIUM SULFATE 2 GM/50ML IV SOLN
2.0000 g | Freq: Once | INTRAVENOUS | Status: AC
  Administered 2018-04-13: 2 g via INTRAVENOUS
  Filled 2018-04-13: qty 50

## 2018-04-13 MED ORDER — METHYLPREDNISOLONE SODIUM SUCC 125 MG IJ SOLR
125.0000 mg | Freq: Once | INTRAMUSCULAR | Status: AC
  Administered 2018-04-13: 125 mg via INTRAVENOUS
  Filled 2018-04-13: qty 2

## 2018-04-13 MED ORDER — ALBUTEROL SULFATE (2.5 MG/3ML) 0.083% IN NEBU
5.0000 mg | INHALATION_SOLUTION | Freq: Once | RESPIRATORY_TRACT | Status: AC
  Administered 2018-04-13: 5 mg via RESPIRATORY_TRACT
  Filled 2018-04-13: qty 6

## 2018-04-13 MED ORDER — ALBUTEROL (5 MG/ML) CONTINUOUS INHALATION SOLN
15.0000 mg/h | INHALATION_SOLUTION | Freq: Once | RESPIRATORY_TRACT | Status: AC
  Administered 2018-04-13: 15 mg/h via RESPIRATORY_TRACT
  Filled 2018-04-13: qty 20

## 2018-04-13 NOTE — ED Provider Notes (Signed)
[facility] EMERGENCY DEPARTMENT Provider Note   CSN: [id number] Arrival date & time: 04/13/18  2128     History   Chief Complaint Chief Complaint  Patient presents with  . Shortness of Breath    HPI ROCHEL PRIVETT is a 77 y.o. female.  HPI  This is a 77 year old female with a history of COPD, dysphasia who presents with shortness of breath.  Patient was admitted to the hospital for COPD exacerbation the first week in April.  She was discharged with a course of azithromycin and steroids.  Patient reports worsening shortness of breath over the last 24 hours.  She reports that she has been using her inhalers at home with no relief.  She denies any fever.  She does report a nonproductive cough.  She is not on home oxygen.  She states that she has chest pain but only when she feels short of breath.  Denies any leg swelling or history of CHF.  Past Medical History:  Diagnosis Date  . Asthma   . Chronic neck pain   . Chronic pain of left elbow   . Complication of anesthesia    hard to wake  . COPD (chronic obstructive pulmonary disease) ([place])   . Dysphagia    requiring multiple dilations in past  . Headache    Patient states she needs  eyeglasses  . Irregular heart beat   . S/P colonoscopy 2009   dr. Gala Romney: anal papilla, 2 cecal AVMs. Due in 2014  . S/P endoscopy 2012   Dr. Gala Romney: mild chronic gastritis, bulbar erosions, s/p 54-French Maloney dilation   . S/P endoscopy 2003, 2004, 2007   gastritis, duodenitis, s/p dilation, +H.pylori     Patient Active Problem List   Diagnosis Date Noted  . Acute gastritis 03/31/2018  . Esophagitis 03/31/2018  . Duodenal ulcer 03/31/2018  . Goals of care, counseling/discussion   . DNR (do not resuscitate) discussion   . Palliative care by specialist   . COPD with acute exacerbation ([place]) 03/29/2018  . COPD (chronic obstructive pulmonary disease) ([place]) 02/03/2018  . Hypoxia 11/28/2015  . COPD exacerbation ([place]) 11/28/2015  . Acute  respiratory failure with hypoxia ([place]) 11/28/2015  . SOB (shortness of breath) 11/28/2015  . Mucosal abnormality of stomach   . History of colonic polyps 04/25/2015  . 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 Dysphagia 03/11/2011    Past Surgical History:  Procedure Laterality Date  . APPENDECTOMY    . BIOPSY  03/30/2018   Procedure: BIOPSY;  Surgeon: Daneil Dolin, MD;  Location: [facility];  Service: Gastroenterology;;  gastric  . CHOLECYSTECTOMY    . COLONOSCOPY  10/10/2008   RMR: 1. Anal papilla, otherwise normal rectum. 2. Two cecal arteriovenous malformations, colonic mucosa apperaed normal.   . COLONOSCOPY N/A 05/10/2015   RMR: Rectal and colonic polyps removed as described above. Ascending colon AVMs . Rectal polyps were tubular adenomas. Next colonoscopy May 2021.  . ESOPHAGEAL DILATION N/A 05/10/2015   Procedure: ESOPHAGEAL DILATION;  Surgeon: Daneil Dolin, MD;  Location: [facility];  Service: Endoscopy;  Laterality: N/A;  . ESOPHAGOGASTRODUODENOSCOPY  04/02/2011   RMR: 1. Endoscopically normal -appearing esophagus status post passage of a Maloney dilator followed by biopsy. 2. Hiatal hernia, Mottling, and submucosal petechiae in teh gastric mucoas of uncertain significance status post biopsy. 3. Bulbar erosions as described above.   . ESOPHAGOGASTRODUODENOSCOPY N/A 05/10/2015   RMR: Normal esophagus status post Maloney dilation. Small hiatal hernia. Abnormal gastric because of  doubtful clinical significance status post biopsy (benign)  . ESOPHAGOGASTRODUODENOSCOPY (EGD) WITH PROPOFOL N/A 03/30/2018   Procedure: ESOPHAGOGASTRODUODENOSCOPY (EGD) WITH PROPOFOL;  Surgeon: Daneil Dolin, MD;  Location: [facility];  Service: Gastroenterology;  Laterality: N/A;  WITH DILATION   . INGUINAL HERNIA REPAIR    . MALONEY DILATION  03/30/2018   Procedure: Venia Minks DILATION;  Surgeon: Daneil Dolin, MD;  Location: [facility];  Service: Gastroenterology;;  . TONSILLECTOMY    . TOTAL  ABDOMINAL HYSTERECTOMY       OB History    Gravida  5   Para  5   Term  4   Preterm  1   AB      Living  4     SAB      TAB      Ectopic      Multiple      Live Births               Home Medications    Prior to Admission medications   Medication Sig Start Date End Date Taking? Authorizing Provider  albuterol (PROVENTIL HFA;VENTOLIN HFA) 108 (90 BASE) MCG/ACT inhaler Inhale 2 puffs into the lungs every 6 (six) hours as needed for wheezing or shortness of breath. 09/04/15  Yes [name], MD  albuterol (PROVENTIL) (2.5 MG/3ML) 0.083% nebulizer solution Take 3 mLs (2.5 mg total) by nebulization every 4 (four) hours as needed for wheezing or shortness of breath. 09/04/15  Yes [name], MD  guaiFENesin (MUCINEX) 600 MG 12 hr tablet Take 1 tablet (600 mg total) by mouth 2 (two) times daily. 03/31/18  Yes Sinda Du, MD  pantoprazole (PROTONIX) 40 MG tablet Take 1 tablet (40 mg total) by mouth daily. 03/31/18  Yes Sinda Du, MD  azithromycin Azucena Fallen Z-PAK) 250 MG tablet Take by package instructions Patient not taking: Reported on 04/13/2018 03/31/18   Sinda Du, MD    Family History Family History  Problem Relation Age of Onset  . Heart disease Mother   . Lung cancer Father   . Cancer Sister   . Diabetes Brother   . Colon cancer Neg Hx     Social History Social History   Tobacco Use  . Smoking status: Current Every Day Smoker    Packs/day: 1.00    Years: 57.00    Pack years: 57.00    Types: Cigarettes    Start date: 08/18/1959  . Smokeless tobacco: Never Used  Substance Use Topics  . Alcohol use: No    Alcohol/week: 0.0 oz  . Drug use: No     Allergies   Patient has no known allergies.   Review of Systems Review of Systems  Constitutional: Negative for fever.  Respiratory: Positive for cough, chest tightness and shortness of breath.   Cardiovascular: Negative for chest pain and leg swelling.  Gastrointestinal: Negative for  abdominal pain, nausea and vomiting.  Neurological: Negative for headaches.  All other systems reviewed and are negative.    Physical Exam Updated Vital Signs BP 118/64   Pulse (!) 52   Temp 98.1 F (36.7 C) (Oral)   Resp (!) 24   Ht 5\' 4"  (1.626 m)   Wt 42.2 kg (93 lb)   SpO2 100%   BMI 15.96 kg/m   Physical Exam  Constitutional: She is oriented to person, place, and time.  Ill-appearing, thin, nontoxic  HENT:  Head: Normocephalic and atraumatic.  Cardiovascular: Normal rate, regular rhythm and normal heart sounds.  Pulmonary/Chest: Tachypnea  noted. She is in respiratory distress. She has wheezes.  Speaking in short sentences, tachypnea, diffuse inspiratory and expiratory wheezing with poor air movement  Abdominal: Soft. Bowel sounds are normal.  Musculoskeletal:       Right lower leg: She exhibits no edema.  Neurological: She is alert and oriented to person, place, and time.  Skin: Skin is warm and dry.  Psychiatric: She has a normal mood and affect.  Nursing note and vitals reviewed.    ED Treatments / Results  Labs (all labs ordered are listed, but only abnormal results are displayed) Labs Reviewed  BASIC METABOLIC PANEL - Abnormal; Notable for the following components:      Result Value   Glucose, Bld 105 (*)    Creatinine, Ser 1.20 (*)    GFR calc non Af Amer 43 (*)    GFR calc Af Amer 50 (*)    All other components within normal limits  CBC - Abnormal; Notable for the following components:   Hemoglobin 11.4 (*)    RDW 16.1 (*)    All other components within normal limits  BLOOD GAS, ARTERIAL - Abnormal; Notable for the following components:   pH, Arterial 7.316 (*)    pCO2 arterial 52.7 (*)    pO2, Arterial 115 (*)    All other components within normal limits    EKG EKG Interpretation  Date/Time:  Monday April 13 2018 22:14:50 EDT Ventricular Rate:  82 PR Interval:    QRS Duration: 88 QT Interval:  365 QTC Calculation: 427 R Axis:   51 Text  Interpretation:  Sinus rhythm Confirmed by Thayer Jew [phone]) on 04/13/2018 11:28:06 PM   Radiology Dg Chest 2 View  Result Date: 04/13/2018 CLINICAL DATA:  Shortness of breath EXAM: CHEST - 2 VIEW COMPARISON:  03/29/2018 FINDINGS: Hyperinflation with emphysematous disease. No acute pulmonary infiltrate or effusion. Borderline cardiomegaly. Aortic atherosclerosis. No pneumothorax. Surgical clips in the right upper quadrant IMPRESSION: No active cardiopulmonary disease. Hyperinflation with emphysematous disease Electronically Signed   By: Donavan Foil M.D.   On: 04/13/2018 23:42    Procedures Procedures (including critical care time)  CRITICAL CARE Performed by: Merryl Hacker   Total critical care time :45 minutes  Critical care time was exclusive of separately billable procedures and treating other patients.  Critical care was necessary to treat or prevent imminent or life-threatening deterioration.  Critical care was time spent personally by me on the following activities: development of treatment plan with patient and/or surrogate as well as nursing, discussions with consultants, evaluation of patient's response to treatment, examination of patient, obtaining history from patient or surrogate, ordering and performing treatments and interventions, ordering and review of laboratory studies, ordering and review of radiographic studies, pulse oximetry and re-evaluation of patient's condition.   Medications Ordered in ED Medications  albuterol (PROVENTIL) (2.5 MG/3ML) 0.083% nebulizer solution 5 mg (5 mg Nebulization Given 04/13/18 2231)  albuterol (PROVENTIL,VENTOLIN) solution continuous neb (15 mg/hr Nebulization Given 04/13/18 2326)  methylPREDNISolone sodium succinate (SOLU-MEDROL) 125 mg/2 mL injection 125 mg (125 mg Intravenous Given 04/13/18 2332)  magnesium sulfate IVPB 2 g 50 mL (0 g Intravenous Stopped 04/14/18 0008)  LORazepam (ATIVAN) injection 1 mg (1 mg Intravenous  Given 04/14/18 0015)     Initial Impression / Assessment and Plan / ED Course  I have reviewed the triage vital signs and the nursing notes.  Pertinent labs & imaging results that were available during my care of the patient were reviewed by me and  considered in my medical decision making (see chart for details).     Patient presents with worsening shortness of breath.  Was admitted for COPD earlier this month.  She is in mild respiratory distress with increased work of breathing, tachypnea, and short sentences.  She is wheezing in all lung fields.  Patient was given a continuous DuoNeb, Solu-Medrol, magnesium.  Chest x-ray shows no evidence of pneumonia or pneumothorax.  EKG is nonischemic.  On recheck, patient states that she feels air hungry and that her throat is closing in.  O2 sats are 100% on a nebulizer.  She states "I need air."  She appears very anxious.  Patient was given IV Ativan and placed on BiPAP for air hunger.  On recheck, she is tolerating BiPAP well and is much more comfortable.  Will admit for presumed COPD exacerbation.  ABG shows mild hypercapnia and respiratory acidosis.  Final Clinical Impressions(s) / ED Diagnoses   Final diagnoses:  COPD exacerbation ([place])  Acute on chronic respiratory failure with hypoxia and hypercapnia [facility])    ED Discharge Orders    None       Dina Rich, Barbette Hair, MD 04/14/18 0100

## 2018-04-13 NOTE — ED Triage Notes (Signed)
Sob got worse today, pt recently discharge from the hospital for same problem

## 2018-04-14 ENCOUNTER — Ambulatory Visit: Payer: Medicare Other | Admitting: Nurse Practitioner

## 2018-04-14 ENCOUNTER — Encounter (HOSPITAL_COMMUNITY): Payer: Self-pay | Admitting: Internal Medicine

## 2018-04-14 DIAGNOSIS — Z90710 Acquired absence of both cervix and uterus: Secondary | ICD-10-CM | POA: Diagnosis not present

## 2018-04-14 DIAGNOSIS — F17210 Nicotine dependence, cigarettes, uncomplicated: Secondary | ICD-10-CM | POA: Diagnosis present

## 2018-04-14 DIAGNOSIS — J441 Chronic obstructive pulmonary disease with (acute) exacerbation: Secondary | ICD-10-CM | POA: Diagnosis not present

## 2018-04-14 DIAGNOSIS — J9622 Acute and chronic respiratory failure with hypercapnia: Secondary | ICD-10-CM | POA: Diagnosis present

## 2018-04-14 DIAGNOSIS — Z801 Family history of malignant neoplasm of trachea, bronchus and lung: Secondary | ICD-10-CM | POA: Diagnosis not present

## 2018-04-14 DIAGNOSIS — E46 Unspecified protein-calorie malnutrition: Secondary | ICD-10-CM | POA: Diagnosis present

## 2018-04-14 DIAGNOSIS — R1310 Dysphagia, unspecified: Secondary | ICD-10-CM | POA: Diagnosis present

## 2018-04-14 DIAGNOSIS — R000 Tachycardia, unspecified: Secondary | ICD-10-CM | POA: Diagnosis not present

## 2018-04-14 DIAGNOSIS — J9621 Acute and chronic respiratory failure with hypoxia: Secondary | ICD-10-CM | POA: Diagnosis present

## 2018-04-14 DIAGNOSIS — M25522 Pain in left elbow: Secondary | ICD-10-CM | POA: Diagnosis present

## 2018-04-14 DIAGNOSIS — Z9049 Acquired absence of other specified parts of digestive tract: Secondary | ICD-10-CM | POA: Diagnosis not present

## 2018-04-14 DIAGNOSIS — R0602 Shortness of breath: Secondary | ICD-10-CM | POA: Diagnosis not present

## 2018-04-14 DIAGNOSIS — Z86010 Personal history of colonic polyps: Secondary | ICD-10-CM | POA: Diagnosis not present

## 2018-04-14 DIAGNOSIS — K219 Gastro-esophageal reflux disease without esophagitis: Secondary | ICD-10-CM | POA: Diagnosis present

## 2018-04-14 DIAGNOSIS — G8929 Other chronic pain: Secondary | ICD-10-CM | POA: Diagnosis present

## 2018-04-14 DIAGNOSIS — Z681 Body mass index (BMI) 19 or less, adult: Secondary | ICD-10-CM | POA: Diagnosis not present

## 2018-04-14 DIAGNOSIS — Z8249 Family history of ischemic heart disease and other diseases of the circulatory system: Secondary | ICD-10-CM | POA: Diagnosis not present

## 2018-04-14 DIAGNOSIS — Z833 Family history of diabetes mellitus: Secondary | ICD-10-CM | POA: Diagnosis not present

## 2018-04-14 LAB — BLOOD GAS, ARTERIAL
Acid-Base Excess: 0.7 mmol/L (ref 0.0–2.0)
Bicarbonate: 24.4 mmol/L (ref 20.0–28.0)
Delivery systems: POSITIVE
Drawn by: 28459
Expiratory PAP: 6
FIO2: 32
INSPIRATORY PAP: 12
O2 SAT: 97.4 %
PO2 ART: 115 mmHg — AB (ref 83.0–108.0)
Patient temperature: 37
pCO2 arterial: 52.7 mmHg — ABNORMAL HIGH (ref 32.0–48.0)
pH, Arterial: 7.316 — ABNORMAL LOW (ref 7.350–7.450)

## 2018-04-14 LAB — COMPREHENSIVE METABOLIC PANEL
ALT: 19 U/L (ref 14–54)
AST: 27 U/L (ref 15–41)
Albumin: 3.9 g/dL (ref 3.5–5.0)
Alkaline Phosphatase: 80 U/L (ref 38–126)
Anion gap: 13 (ref 5–15)
BILIRUBIN TOTAL: 0.6 mg/dL (ref 0.3–1.2)
BUN: 22 mg/dL — AB (ref 6–20)
CO2: 26 mmol/L (ref 22–32)
CREATININE: 1.07 mg/dL — AB (ref 0.44–1.00)
Calcium: 9.3 mg/dL (ref 8.9–10.3)
Chloride: 103 mmol/L (ref 101–111)
GFR, EST AFRICAN AMERICAN: 58 mL/min — AB (ref >60)
GFR, EST NON AFRICAN AMERICAN: 50 mL/min — AB (ref >60)
Glucose, Bld: 157 mg/dL — ABNORMAL HIGH (ref 65–99)
POTASSIUM: 3.5 mmol/L (ref 3.5–5.1)
Sodium: 142 mmol/L (ref 135–145)
Total Protein: 6.4 g/dL — ABNORMAL LOW (ref 6.5–8.1)

## 2018-04-14 LAB — CBC
HCT: 34 % — ABNORMAL LOW (ref 36.0–46.0)
Hemoglobin: 10.7 g/dL — ABNORMAL LOW (ref 12.0–15.0)
MCH: 26.2 pg (ref 26.0–34.0)
MCHC: 31.5 g/dL (ref 30.0–36.0)
MCV: 83.3 fL (ref 78.0–100.0)
PLATELETS: 341 10*3/uL (ref 150–400)
RBC: 4.08 MIL/uL (ref 3.87–5.11)
RDW: 16 % — AB (ref 11.5–15.5)
WBC: 15.5 10*3/uL — AB (ref 4.0–10.5)

## 2018-04-14 LAB — TROPONIN I: Troponin I: <0.03 ng/mL (ref <0.03)

## 2018-04-14 LAB — TSH: TSH: 0.535 u[IU]/mL (ref 0.350–4.500)

## 2018-04-14 LAB — MRSA PCR SCREENING: MRSA by PCR: NEGATIVE

## 2018-04-14 LAB — D-DIMER, QUANTITATIVE: D-Dimer, Quant: <0.27 ug/mL-FEU (ref 0.00–0.50)

## 2018-04-14 MED ORDER — ALBUTEROL SULFATE (2.5 MG/3ML) 0.083% IN NEBU: 2.5000 mg | INHALATION_SOLUTION | RESPIRATORY_TRACT | Status: DC | PRN

## 2018-04-14 MED ORDER — ACETAMINOPHEN 650 MG RE SUPP: 650.0000 mg | Freq: Four times a day (QID) | RECTAL | Status: DC | PRN

## 2018-04-14 MED ORDER — ALBUTEROL SULFATE (2.5 MG/3ML) 0.083% IN NEBU
2.5000 mg | INHALATION_SOLUTION | Freq: Two times a day (BID) | RESPIRATORY_TRACT | Status: DC
  Administered 2018-04-15 – 2018-04-16 (×3): 2.5 mg via RESPIRATORY_TRACT
  Filled 2018-04-14 (×4): qty 3

## 2018-04-14 MED ORDER — SODIUM CHLORIDE 0.9 % IV SOLN
INTRAVENOUS | Status: AC
  Administered 2018-04-14: 03:00:00 via INTRAVENOUS

## 2018-04-14 MED ORDER — ENOXAPARIN SODIUM 30 MG/0.3ML ~~LOC~~ SOLN
30.0000 mg | SUBCUTANEOUS | Status: DC
  Administered 2018-04-14 – 2018-04-16 (×3): 30 mg via SUBCUTANEOUS
  Filled 2018-04-14 (×3): qty 0.3

## 2018-04-14 MED ORDER — UMECLIDINIUM-VILANTEROL 62.5-25 MCG/INH IN AEPB
1.0000 | INHALATION_SPRAY | Freq: Every day | RESPIRATORY_TRACT | Status: DC
  Administered 2018-04-15: 1 via RESPIRATORY_TRACT
  Filled 2018-04-14: qty 14

## 2018-04-14 MED ORDER — LORAZEPAM 2 MG/ML IJ SOLN
1.0000 mg | Freq: Once | INTRAMUSCULAR | Status: AC
  Administered 2018-04-14: 1 mg via INTRAVENOUS
  Filled 2018-04-14: qty 1

## 2018-04-14 MED ORDER — SODIUM CHLORIDE 0.9 % IV SOLN
500.0000 mg | INTRAVENOUS | Status: DC
  Administered 2018-04-14 – 2018-04-16 (×3): 500 mg via INTRAVENOUS
  Filled 2018-04-14 (×6): qty 500

## 2018-04-14 MED ORDER — METHYLPREDNISOLONE SODIUM SUCC 125 MG IJ SOLR
80.0000 mg | Freq: Three times a day (TID) | INTRAMUSCULAR | Status: DC
  Administered 2018-04-14 – 2018-04-16 (×7): 80 mg via INTRAVENOUS
  Filled 2018-04-14 (×7): qty 2

## 2018-04-14 MED ORDER — ACETAMINOPHEN 325 MG PO TABS: 650.0000 mg | ORAL_TABLET | Freq: Four times a day (QID) | ORAL | Status: DC | PRN

## 2018-04-14 MED ORDER — PANTOPRAZOLE SODIUM 40 MG PO TBEC
40.0000 mg | DELAYED_RELEASE_TABLET | Freq: Every day | ORAL | Status: DC
Start: 2018-04-14 — End: 2018-04-16
  Administered 2018-04-14 – 2018-04-16 (×3): 40 mg via ORAL
  Filled 2018-04-14 (×3): qty 1

## 2018-04-14 MED ORDER — ALBUTEROL SULFATE (2.5 MG/3ML) 0.083% IN NEBU
2.5000 mg | INHALATION_SOLUTION | Freq: Four times a day (QID) | RESPIRATORY_TRACT | Status: DC
  Administered 2018-04-14 (×4): 2.5 mg via RESPIRATORY_TRACT
  Filled 2018-04-14 (×4): qty 3

## 2018-04-14 NOTE — H&P (Signed)
[facility] H&P   Patient Demographics:    Katelyn [name], is a 77 y.o. female  MRN: [id number]   DOB - 1941-12-10  Admit Date - 04/13/2018  Outpatient Primary MD for the patient is Sinda Du, MD  Referring MD/NP/PA: Thayer Jew  Outpatient Specialists:    Patient coming from: home  Chief Complaint  Patient presents with  . Shortness of Breath      HPI:    Katelyn [name]  is a 77 y.o. female, w Copd,(not on home o2), Nicotine dep, dysphagia, who presents w c/o dyspnea  Worse for the past 24 hours.  Pt was recently admitted for Copd exacerbation.  Pt has slight nonproductive cough.  Denies fever, chills, cp, palp, n/v, diarrhea, brbpr,   In ED,  CXR IMPRESSION: No active cardiopulmonary disease. Hyperinflation with emphysematous disease  Na 141, K 3.8, Bun 19, Creatinine 1.20 Wbc 9.8, Hgb 11.4, Plt 378  ABG 7.316, co2 52.7, o2 115    Pt will be admitted for Copd exacerbation.        Review of systems:    In addition to the HPI above,   No Fever-chills, No Headache, No changes with Vision or hearing, No problems swallowing food or Liquids, No Chest pain,  No Abdominal pain, No Nausea or Vommitting, Bowel movements are regular, No Blood in stool or Urine, No dysuria, No new skin rashes or bruises, No new joints pains-aches,  No new weakness, tingling, numbness in any extremity, No recent weight gain or loss, No polyuria, polydypsia or polyphagia, No significant Mental Stressors.  A full 10 point Review of Systems was done, except as stated above, all other Review of Systems were negative.   With Past History of the following :    Past Medical History:  Diagnosis Date  . Asthma   . Chronic neck pain   . Chronic pain of left elbow   . Complication of anesthesia    hard to wake  . COPD (chronic obstructive pulmonary disease) ([place])   . Dysphagia    requiring multiple dilations in past  . Headache    Patient states she needs  eyeglasses  . Irregular heart beat   . S/P colonoscopy 2009   dr. Gala Romney: anal papilla, 2 cecal AVMs. Due in 2014  . S/P endoscopy 2012   Dr. Gala Romney: mild chronic gastritis, bulbar erosions, s/p 54-French Maloney dilation   . S/P endoscopy 2003, 2004, 2007   gastritis, duodenitis, s/p dilation, +H.pylori       Past Surgical History:  Procedure Laterality Date  . APPENDECTOMY    . BIOPSY  03/30/2018   Procedure: BIOPSY;  Surgeon: Daneil Dolin, MD;  [facility];  Service: Gastroenterology;;  gastric  . CHOLECYSTECTOMY    . COLONOSCOPY  10/10/2008   RMR: 1. Anal papilla, otherwise normal rectum. 2. Two cecal arteriovenous malformations, colonic mucosa apperaed normal.   .  COLONOSCOPY N/A 05/10/2015   RMR: Rectal and colonic polyps removed as described above. Ascending colon AVMs . Rectal polyps were tubular adenomas. Next colonoscopy May 2021.  . ESOPHAGEAL DILATION N/A 05/10/2015   Procedure: ESOPHAGEAL DILATION;  Surgeon: Daneil Dolin, MD;  [facility];  Service: Endoscopy;  Laterality: N/A;  . ESOPHAGOGASTRODUODENOSCOPY  04/02/2011   RMR: 1. Endoscopically normal -appearing esophagus status post passage of a Maloney dilator followed by biopsy. 2. Hiatal hernia, Mottling, and submucosal petechiae in teh gastric mucoas of uncertain significance status post biopsy. 3. Bulbar erosions as described above.   . ESOPHAGOGASTRODUODENOSCOPY N/A 05/10/2015   RMR: Normal esophagus status post Maloney dilation. Small hiatal hernia. Abnormal gastric because of doubtful clinical significance status post biopsy (benign)  . ESOPHAGOGASTRODUODENOSCOPY (EGD) WITH PROPOFOL N/A 03/30/2018   Procedure: ESOPHAGOGASTRODUODENOSCOPY (EGD) WITH PROPOFOL;  Surgeon: Daneil Dolin, MD;  [facility];  Service: Gastroenterology;  Laterality: N/A;  WITH DILATION   . INGUINAL HERNIA REPAIR    . MALONEY  DILATION  03/30/2018   Procedure: Venia Minks DILATION;  Surgeon: Daneil Dolin, MD;  [facility];  Service: Gastroenterology;;  . TONSILLECTOMY    . TOTAL ABDOMINAL HYSTERECTOMY        Social History:     Social History   Tobacco Use  . Smoking status: Current Every Day Smoker    Packs/day: 1.00    Years: 57.00    Pack years: 57.00    Types: Cigarettes    Start date: 08/18/1959  . Smokeless tobacco: Never Used  Substance Use Topics  . Alcohol use: No    Alcohol/week: 0.0 oz     Lives - at home  Mobility - walks by self   Family History :     Family History  Problem Relation Age of Onset  . Heart disease Mother   . Lung cancer Father   . Cancer Sister   . Diabetes Brother   . Colon cancer Neg Hx        Home Medications:   Prior to Admission medications   Medication Sig Start Date End Date Taking? Authorizing Provider  albuterol (PROVENTIL HFA;VENTOLIN HFA) 108 (90 BASE) MCG/ACT inhaler Inhale 2 puffs into the lungs every 6 (six) hours as needed for wheezing or shortness of breath. 09/04/15  Yes [name], MD  albuterol (PROVENTIL) (2.5 MG/3ML) 0.083% nebulizer solution Take 3 mLs (2.5 mg total) by nebulization every 4 (four) hours as needed for wheezing or shortness of breath. 09/04/15  Yes [name], MD  guaiFENesin (MUCINEX) 600 MG 12 hr tablet Take 1 tablet (600 mg total) by mouth 2 (two) times daily. 03/31/18  Yes Sinda Du, MD  pantoprazole (PROTONIX) 40 MG tablet Take 1 tablet (40 mg total) by mouth daily. 03/31/18  Yes Sinda Du, MD  azithromycin (ZITHROMAX Z-PAK) 250 MG tablet Take by package instructions Patient not taking: Reported on 04/13/2018 03/31/18   Sinda Du, MD     Allergies:    No Known Allergies   Physical Exam:   Vitals  Blood pressure 111/62, pulse (!) 118, temperature 98.1 F (36.7 C), temperature source Oral, resp. rate (!) 22, height 5\' 4"  (1.626 m), weight 42.2 kg (93 lb), SpO2 97 %.   1. General   lying in bed in NAD,   2. Normal affect and insight, Not Suicidal or Homicidal, Awake Alert, Oriented X 3.  3. No F.N deficits, ALL C.Nerves Intact, Strength 5/5 all 4 extremities, Sensation intact all  4 extremities, Plantars down going.  4. Ears and Eyes appear Normal, Conjunctivae clear, PERRLA. Moist Oral Mucosa.  5. Supple Neck, No JVD, No cervical lymphadenopathy appriciated, No Carotid Bruits.  6. Symmetrical Chest wall movement, + bilateral exp wheeze, no crackles  7. Tachy s1, s2, .  8. Positive Bowel Sounds, Abdomen Soft, No tenderness, No organomegaly appriciated,No rebound -guarding or rigidity.  9.  No Cyanosis, Normal Skin Turgor, No Skin Rash or Bruise.  10. Good muscle tone,  joints appear normal , no effusions, Normal ROM.  11. No Palpable Lymph Nodes in Neck or Axillae    Data Review:    CBC Recent Labs  Lab 04/13/18 2226  WBC 9.8  HGB 11.4*  HCT 36.3  PLT 378  MCV 83.4  MCH 26.2  MCHC 31.4  RDW 16.1*   ------------------------------------------------------------------------------------------------------------------  Chemistries  Recent Labs  Lab 04/13/18 2226  NA 141  K 3.8  CL 101  CO2 28  GLUCOSE 105*  BUN 19  CREATININE 1.20*  CALCIUM 9.7   ------------------------------------------------------------------------------------------------------------------ estimated creatinine clearance is 27.4 mL/min (A) (by C-G formula based on SCr of 1.2 mg/dL (H)). ------------------------------------------------------------------------------------------------------------------ No results for input(s): TSH, T4TOTAL, T3FREE, THYROIDAB in the last 72 hours.  Invalid input(s): FREET3  Coagulation profile No results for input(s): INR, PROTIME in the last 168 hours. ------------------------------------------------------------------------------------------------------------------- No results for input(s): DDIMER in the last 72  hours. -------------------------------------------------------------------------------------------------------------------  Cardiac Enzymes No results for input(s): CKMB, TROPONINI, MYOGLOBIN in the last 168 hours.  Invalid input(s): CK ------------------------------------------------------------------------------------------------------------------    Component Value Date/Time   BNP 112.0 (H) 11/28/2015 0230     ---------------------------------------------------------------------------------------------------------------  Urinalysis No results found for: COLORURINE, APPEARANCEUR, LABSPEC, PHURINE, GLUCOSEU, HGBUR, BILIRUBINUR, KETONESUR, PROTEINUR, UROBILINOGEN, NITRITE, LEUKOCYTESUR  ----------------------------------------------------------------------------------------------------------------   Imaging Results:    Dg Chest 2 View  Result Date: 04/13/2018 CLINICAL DATA:  Shortness of breath EXAM: CHEST - 2 VIEW COMPARISON:  03/29/2018 FINDINGS: Hyperinflation with emphysematous disease. No acute pulmonary infiltrate or effusion. Borderline cardiomegaly. Aortic atherosclerosis. No pneumothorax. Surgical clips in the right upper quadrant IMPRESSION: No active cardiopulmonary disease. Hyperinflation with emphysematous disease Electronically Signed   By: Donavan Foil M.D.   On: 04/13/2018 23:42      Assessment & Plan:    Principal Problem:   COPD exacerbation ([facility]) Active Problems:   Tachycardia    Copd exacerbation Solumedrol  80mg  iv q8h Zithromax 500mg  iv qday Anoro 1puff qday Albuterol 1 neb po q6h  And q6h prn   Tachycardia Tele Trop I q6h x3 D dimer If positive then CTA chest r/o PE  Gerd Cont protonix  DVT Prophylaxis Lovenox - SCDs  AM Labs Ordered, also please review Full Orders  Family Communication: Admission, patients condition and plan of care including tests being ordered have been discussed with the patient  who indicate understanding and agree  with the plan and Code Status.  Code Status FULL CODE  Likely DC to  home  Condition GUARDED    Consults called: none  Admission status: inpatient   Time spent in minutes : 45   Jani Gravel M.D on 04/14/2018 at 1:25 AM  Between 7am to 7pm - Pager - [phone]  . After 7pm go to [email or web address] - password [facility]  [facility] - Office  [phone]

## 2018-04-14 NOTE — Progress Notes (Deleted)
Referring Provider: Sinda Du, MD Primary Care Physician:  Sinda Du, MD Primary GI:  Dr.   Rayne Du chief complaint on file.   HPI:   Katelyn [name] is a 77 y.o. female who presents for hospital follow-up with history of dysphagia.  The patient was admitted to [facility] from 03/29/2018 through 03/31/2018 for COPD exacerbation as well as dysphasia, gastritis.  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  Recommended Protonix 40 mg daily, no repeat upper EGD.  Surgical pathology found the biopsies to be gastric antral and oxyntic mucosa with mild, reactive nonspecific gastropathy.  Today she states   Past Medical History:  Diagnosis Date  . Asthma   . Chronic neck pain   . Chronic pain of left elbow   . Complication of anesthesia    hard to wake  . COPD (chronic obstructive pulmonary disease) ([place])   . Dysphagia    requiring multiple dilations in past  . Headache    Patient states she needs  eyeglasses  . Irregular heart beat   . S/P colonoscopy 2009   dr. Gala Romney: anal papilla, 2 cecal AVMs. Due in 2014  . S/P endoscopy 2012   Dr. Gala Romney: mild chronic gastritis, bulbar erosions, s/p 54-French Maloney dilation   . S/P endoscopy 2003, 2004, 2007   gastritis, duodenitis, s/p dilation, +H.pylori     Past Surgical History:  Procedure Laterality Date  . APPENDECTOMY    . BIOPSY  03/30/2018   Procedure: BIOPSY;  Surgeon: Daneil Dolin, MD;  [facility]: [facility];  Service: Gastroenterology;;  gastric  . CHOLECYSTECTOMY    . COLONOSCOPY  10/10/2008   RMR: 1. Anal papilla, otherwise normal rectum. 2. Two cecal arteriovenous malformations, colonic mucosa apperaed normal.   . COLONOSCOPY N/A 05/10/2015   RMR: Rectal and colonic polyps removed as described above. Ascending colon AVMs . Rectal polyps were tubular adenomas. Next colonoscopy May  2021.  . ESOPHAGEAL DILATION N/A 05/10/2015   Procedure: ESOPHAGEAL DILATION;  Surgeon: Daneil Dolin, MD;  [facility];  Service: Endoscopy;  Laterality: N/A;  . ESOPHAGOGASTRODUODENOSCOPY  04/02/2011   RMR: 1. Endoscopically normal -appearing esophagus status post passage of a Maloney dilator followed by biopsy. 2. Hiatal hernia, Mottling, and submucosal petechiae in teh gastric mucoas of uncertain significance status post biopsy. 3. Bulbar erosions as described above.   . ESOPHAGOGASTRODUODENOSCOPY N/A 05/10/2015   RMR: Normal esophagus status post Maloney dilation. Small hiatal hernia. Abnormal gastric because of doubtful clinical significance status post biopsy (benign)  . ESOPHAGOGASTRODUODENOSCOPY (EGD) WITH PROPOFOL N/A 03/30/2018   Procedure: ESOPHAGOGASTRODUODENOSCOPY (EGD) WITH PROPOFOL;  Surgeon: Daneil Dolin, MD;  [facility];  Service: Gastroenterology;  Laterality: N/A;  WITH DILATION   . INGUINAL HERNIA REPAIR    . MALONEY DILATION  03/30/2018   Procedure: Venia Minks DILATION;  Surgeon: Daneil Dolin, MD;  Location[facility];  Service: Gastroenterology;;  . TONSILLECTOMY    . TOTAL ABDOMINAL HYSTERECTOMY      No current facility-administered medications for this visit.    No current outpatient medications on file.   Facility-Administered Medications Ordered in Other Visits  Medication Dose Route Frequency Provider Last Rate Last Dose  . 0.9 %  sodium chloride infusion   Intravenous Continuous Jani Gravel, MD 75 mL/hr at 04/14/18 0230    . acetaminophen (TYLENOL) tablet 650 mg  650 mg  Oral Q6H PRN Jani Gravel, MD       Or  . acetaminophen (TYLENOL) suppository 650 mg  650 mg Rectal Q6H PRN Jani Gravel, MD      . albuterol (PROVENTIL) (2.5 MG/3ML) 0.083% nebulizer solution 2.5 mg  2.5 mg Nebulization Q4H PRN Jani Gravel, MD      . albuterol (PROVENTIL) (2.5 MG/3ML) 0.083% nebulizer solution 2.5 mg  2.5 mg Nebulization Q6H Jani Gravel, MD   2.5 mg at  04/14/18 0224  . azithromycin (ZITHROMAX) 500 mg in sodium chloride 0.9 % 250 mL IVPB  500 mg Intravenous Q24H Jani Gravel, MD   Stopped at 04/14/18 [phone]  . enoxaparin (LOVENOX) injection 30 mg  30 mg Subcutaneous Q24H Jani Gravel, MD   30 mg at 04/14/18 0500  . methylPREDNISolone sodium succinate (SOLU-MEDROL) 125 mg/2 mL injection 80 mg  80 mg Intravenous Lysle Dingwall, MD   80 mg at 04/14/18 0500  . pantoprazole (PROTONIX) EC tablet 40 mg  40 mg Oral Daily Jani Gravel, MD      . umeclidinium-vilanterol [facility] ELLIPTA) 62.5-25 MCG/INH 1 puff  1 puff Inhalation Daily Jani Gravel, MD        Allergies as of 04/14/2018  . (No Known Allergies)    Family History  Problem Relation Age of Onset  . Heart disease Mother   . Lung cancer Father   . Cancer Sister   . Diabetes Brother   . Colon cancer Neg Hx     Social History   Socioeconomic History  . Marital status: Widowed    Spouse name: Not on file  . Number of children: Not on file  . Years of education: Not on file  . Highest education level: Not on file  Occupational History  . Not on file  Social Needs  . Financial resource strain: Not on file  . Food insecurity:    Worry: Not on file    Inability: Not on file  . Transportation needs:    Medical: Not on file    Non-medical: Not on file  Tobacco Use  . Smoking status: Current Every Day Smoker    Packs/day: 1.00    Years: 57.00    Pack years: 57.00    Types: Cigarettes    Start date: 08/18/1959  . Smokeless tobacco: Never Used  Substance and Sexual Activity  . Alcohol use: No    Alcohol/week: 0.0 oz  . Drug use: No  . Sexual activity: Not on file  Lifestyle  . Physical activity:    Days per week: Not on file    Minutes per session: Not on file  . Stress: Not on file  Relationships  . Social connections:    Talks on phone: Not on file    Gets together: Not on file    Attends religious service: Not on file    Active member of club or organization: Not on file     Attends meetings of clubs or organizations: Not on file    Relationship status: Not on file  Other Topics Concern  . Not on file  Social History Narrative  . Not on file    Review of Systems: General: Negative for anorexia, weight loss, fever, chills, fatigue, weakness. Eyes: Negative for vision changes.  ENT: Negative for hoarseness, difficulty swallowing , nasal congestion. CV: Negative for chest pain, angina, palpitations, dyspnea on exertion, peripheral edema.  Respiratory: Negative for dyspnea at rest, dyspnea on exertion, cough, sputum, wheezing.  GI: See history  of present illness. GU:  Negative for dysuria, hematuria, urinary incontinence, urinary frequency, nocturnal urination.  MS: Negative for joint pain, low back pain.  Derm: Negative for rash or itching.  Neuro: Negative for weakness, abnormal sensation, seizure, frequent headaches, memory loss, confusion.  Psych: Negative for anxiety, depression, suicidal ideation, hallucinations.  Endo: Negative for unusual weight change.  Heme: Negative for bruising or bleeding. Allergy: Negative for rash or hives.   Physical Exam: There were no vitals taken for this visit. General:   Alert and oriented. Pleasant and cooperative. Well-nourished and well-developed.  Head:  Normocephalic and atraumatic. Eyes:  Without icterus, sclera clear and conjunctiva pink.  Ears:  Normal auditory acuity. Mouth:  No deformity or lesions, oral mucosa pink.  Throat/Neck:  Supple, without mass or thyromegaly. Cardiovascular:  S1, S2 present without murmurs appreciated. Normal pulses noted. Extremities without clubbing or edema. Respiratory:  Clear to auscultation bilaterally. No wheezes, rales, or rhonchi. No distress.  Gastrointestinal:  +BS, soft, non-tender and non-distended. No HSM noted. No guarding or rebound. No masses appreciated.  Rectal:  Deferred  Musculoskalatal:  Symmetrical without gross deformities. Normal posture. Skin:  Intact  without significant lesions or rashes. Neurologic:  Alert and oriented x4;  grossly normal neurologically. Psych:  Alert and cooperative. Normal mood and affect. Heme/Lymph/Immune: No significant cervical adenopathy. No excessive bruising noted.    04/14/2018 8:01 AM   Disclaimer: This note was dictated with voice recognition software. Similar sounding words can inadvertently be transcribed and may not be corrected upon review.

## 2018-04-14 NOTE — Progress Notes (Signed)
Patient seen and examined, database reviewed.  No family members at bedside.  Patient admitted earlier today due to shortness of breath.  Has a history of COPD and required BiPAP transiently.  Has been weaned off BiPAP and appears to be doing well.  Will transfer to floor today.  Dr. Luan Pulling to assume care in a.m.  Domingo Mend, MD [facility] Pager: [phone]

## 2018-04-14 NOTE — Progress Notes (Signed)
RT removed patient from BIPAP and gave albuterol treatment. Post treatment RT placed patient on 2L O2. Patient SATs 100%, HR 91 and RR 21. RT will continue to monitor.

## 2018-04-14 NOTE — Progress Notes (Signed)
Pt placed on [facility] Servo I BIPAP.  Pt tolerating well. BIPAP plugged into red outlet

## 2018-04-15 ENCOUNTER — Inpatient Hospital Stay (HOSPITAL_COMMUNITY): Payer: Medicare Other

## 2018-04-15 MED ORDER — LORATADINE 10 MG PO TABS
10.0000 mg | ORAL_TABLET | Freq: Every day | ORAL | Status: DC
  Administered 2018-04-15 – 2018-04-16 (×2): 10 mg via ORAL
  Filled 2018-04-15 (×2): qty 1

## 2018-04-15 MED ORDER — IOPAMIDOL (ISOVUE-370) INJECTION 76%
100.0000 mL | Freq: Once | INTRAVENOUS | Status: AC | PRN
  Administered 2018-04-15: 100 mL via INTRAVENOUS

## 2018-04-15 NOTE — Progress Notes (Signed)
Subjective: She says she feels okay.  She is still short of breath.  She is still coughing.  She said that her problems seem to happen fairly suddenly after she got up to go to the bathroom.  She is still smoking.  She has pretty severe COPD at baseline.  Objective: Vital signs in last 24 hours: Temp:  [97.3 F (36.3 C)-99 F (37.2 C)] 97.3 F (36.3 C) (04/24 0531) Pulse Rate:  [74-107] 74 (04/24 0531) Resp:  [19-30] 29 (04/23 1800) BP: (90-114)/(50-60) 106/58 (04/24 0531) SpO2:  [92 %-99 %] 94 % (04/24 0531) Weight:  [43.3 kg (95 lb 7.4 oz)] 43.3 kg (95 lb 7.4 oz) (04/23 2202) Weight change: 1.116 kg (2 lb 7.4 oz) Last BM Date: 04/14/18  Intake/Output from previous day: 04/23 0701 - 04/24 0700 In: 120 [P.O.:120] Out: -   PHYSICAL EXAM General appearance: alert, cooperative and no distress Resp: Her chest is clear with markedly prolonged expiratory phase Cardio: regular rate and rhythm, S1, S2 normal, no murmur, click, rub or gallop GI: soft, non-tender; bowel sounds normal; no masses,  no organomegaly Extremities: extremities normal, atraumatic, no cyanosis or edema  Lab Results:  Results for orders placed or performed during the hospital encounter of 04/13/18 (from the past 48 hour(s))  Basic metabolic panel     Status: Abnormal   Collection Time: 04/13/18 10:26 PM  Result Value Ref Range   Sodium 141 135 - 145 mmol/[name]   Potassium 3.8 3.5 - 5.1 mmol/[name]   Chloride 101 101 - 111 mmol/[name]   CO2 28 22 - 32 mmol/[name]   Glucose, Bld 105 (H) 65 - 99 mg/dL   BUN 19 6 - 20 mg/dL   Creatinine, Ser 1.20 (H) 0.44 - 1.00 mg/dL   Calcium 9.7 8.9 - 10.3 mg/dL   GFR calc non Af Amer 43 ([name]) >60 mL/min   GFR calc Af Amer 50 ([name]) >60 mL/min    Comment: (NOTE) The eGFR has been calculated using the CKD EPI equation. This calculation has not been validated in all clinical situations. eGFR's persistently <60 mL/min signify possible Chronic Kidney Disease.    Anion gap 12 5 - 15    Comment:  Performed at [facility], [address] [place], [place] [address]  CBC     Status: Abnormal   Collection Time: 04/13/18 10:26 PM  Result Value Ref Range   WBC 9.8 4.0 - 10.5 K/uL   RBC 4.35 3.87 - 5.11 MIL/uL   Hemoglobin 11.4 ([name]) 12.0 - 15.0 g/dL   HCT 36.3 36.0 - 46.0 %   MCV 83.4 78.0 - 100.0 fL   MCH 26.2 26.0 - 34.0 pg   MCHC 31.4 30.0 - 36.0 g/dL   RDW 16.1 (H) 11.5 - 15.5 %   Platelets 378 150 - 400 K/uL    Comment: Performed at [facility], [address] [place],  [address]  Blood gas, arterial ([facility] & [facility] ONLY)     Status: Abnormal   Collection Time: 04/14/18 12:33 AM  Result Value Ref Range   FIO2 32.00    Delivery systems BILEVEL POSITIVE AIRWAY PRESSURE    Inspiratory PAP 12    Expiratory PAP 6    pH, Arterial 7.316 ([name]) 7.350 - 7.450   pCO2 arterial 52.7 (H) 32.0 - 48.0 mmHg   pO2, Arterial 115 (H) 83.0 - 108.0 mmHg   Bicarbonate 24.4 20.0 - 28.0 mmol/[name]   Acid-Base Excess 0.7 0.0 - 2.0 mmol/[name]   O2 Saturation  97.4 %   Patient temperature 37.0    Collection site RIGHT RADIAL    Drawn by [phone]    Sample type ARTERIAL DRAW    Allens test (pass/fail) PASS PASS    Comment: Performed at [facility], [address], [place], [place] [address]  MRSA PCR Screening     Status: None   Collection Time: 04/14/18  2:20 AM  Result Value Ref Range   MRSA by PCR NEGATIVE NEGATIVE    Comment:        The GeneXpert MRSA Assay (FDA approved for NASAL specimens only), is one component of a comprehensive MRSA colonization surveillance program. It is not intended to diagnose MRSA infection nor to guide or monitor treatment for MRSA infections. Performed at [facility], [address], [place], [place] [address]   Comprehensive metabolic panel     Status: Abnormal   Collection Time: 04/14/18  2:42 AM  Result Value Ref Range   Sodium 142 135 - 145 mmol/[name]   Potassium 3.5 3.5 - 5.1 mmol/[name]   Chloride 103 101 - 111 mmol/[name]   CO2 26 22 - 32 mmol/[name]   Glucose, Bld 157 (H) 65  - 99 mg/dL   BUN 22 (H) 6 - 20 mg/dL   Creatinine, Ser 1.07 (H) 0.44 - 1.00 mg/dL   Calcium 9.3 8.9 - 10.3 mg/dL   Total Protein 6.4 ([name]) 6.5 - 8.1 g/dL   Albumin 3.9 3.5 - 5.0 g/dL   AST 27 15 - 41 U/[name]   ALT 19 14 - 54 U/[name]   Alkaline Phosphatase 80 38 - 126 U/[name]   Total Bilirubin 0.6 0.3 - 1.2 mg/dL   GFR calc non Af Amer 50 ([name]) >60 mL/min   GFR calc Af Amer 58 ([name]) >60 mL/min    Comment: (NOTE) The eGFR has been calculated using the CKD EPI equation. This calculation has not been validated in all clinical situations. eGFR's persistently <60 mL/min signify possible Chronic Kidney Disease.    Anion gap 13 5 - 15    Comment: Performed at [facility], [address] [place], [place] [address]  CBC     Status: Abnormal   Collection Time: 04/14/18  2:42 AM  Result Value Ref Range   WBC 15.5 (H) 4.0 - 10.5 K/uL   RBC 4.08 3.87 - 5.11 MIL/uL   Hemoglobin 10.7 ([name]) 12.0 - 15.0 g/dL   HCT 34.0 ([name]) 36.0 - 46.0 %   MCV 83.3 78.0 - 100.0 fL   MCH 26.2 26.0 - 34.0 pg   MCHC 31.5 30.0 - 36.0 g/dL   RDW 16.0 (H) 11.5 - 15.5 %   Platelets 341 150 - 400 K/uL    Comment: Performed at [facility], [address] [place], [place] [address]  TSH     Status: None   Collection Time: 04/14/18  2:42 AM  Result Value Ref Range   TSH 0.535 0.350 - 4.500 uIU/mL    Comment: Performed by a 3rd Generation assay with a functional sensitivity of <=0.01 uIU/mL. Performed at [facility], [address], [place], [place] [address]   Troponin I     Status: None   Collection Time: 04/14/18  2:42 AM  Result Value Ref Range   Troponin I <0.03 <0.03 ng/mL    Comment: Performed at [facility], [address] [place], [place] [address]  D-dimer, quantitative (not at [facility])     Status: None   Collection Time: 04/14/18  2:42 AM  Result  Value Ref Range   D-Dimer, Quant <0.27 0.00 - 0.50 ug/mL-FEU    Comment: (NOTE) At the manufacturer cut-off of 0.50 ug/mL FEU, this assay has been documented to exclude PE with a  sensitivity and negative predictive value of 97 to 99%.  At this time, this assay has not been approved by the FDA to exclude DVT/VTE. Results should be correlated with clinical presentation. Performed at [facility], [address] [place], [place] [address]   Troponin I     Status: None   Collection Time: 04/14/18  7:57 AM  Result Value Ref Range   Troponin I <0.03 <0.03 ng/mL    Comment: Performed at [facility], [address] [place], [place] [address]  Troponin I     Status: None   Collection Time: 04/14/18  2:28 PM  Result Value Ref Range   Troponin I <0.03 <0.03 ng/mL    Comment: Performed at [facility], [address] [place], [place] [address]    ABGS Recent Labs    04/14/18 0033  PHART 7.316*  PO2ART 115*  HCO3 24.4   CULTURES Recent Results (from the past 240 hour(s))  MRSA PCR Screening     Status: None   Collection Time: 04/14/18  2:20 AM  Result Value Ref Range Status   MRSA by PCR NEGATIVE NEGATIVE Final    Comment:        The GeneXpert MRSA Assay (FDA approved for NASAL specimens only), is one component of a comprehensive MRSA colonization surveillance program. It is not intended to diagnose MRSA infection nor to guide or monitor treatment for MRSA infections. Performed at [facility], [address] [place], [place] [address]    Studies/Results: Dg Chest 2 View  Result Date: 04/13/2018 CLINICAL DATA:  Shortness of breath EXAM: CHEST - 2 VIEW COMPARISON:  03/29/2018 FINDINGS: Hyperinflation with emphysematous disease. No acute pulmonary infiltrate or effusion. Borderline cardiomegaly. Aortic atherosclerosis. No pneumothorax. Surgical clips in the right upper quadrant IMPRESSION: No active cardiopulmonary disease. Hyperinflation with emphysematous disease Electronically Signed   By: Donavan Foil M.D.   On: 04/13/2018 23:42    Medications:  Prior to Admission:  Medications Prior to Admission  Medication Sig Dispense Refill Last Dose  .  albuterol (PROVENTIL HFA;VENTOLIN HFA) 108 (90 BASE) MCG/ACT inhaler Inhale 2 puffs into the lungs every 6 (six) hours as needed for wheezing or shortness of breath. 1 Inhaler 1 04/13/2018 at Unknown time  . albuterol (PROVENTIL) (2.5 MG/3ML) 0.083% nebulizer solution Take 3 mLs (2.5 mg total) by nebulization every 4 (four) hours as needed for wheezing or shortness of breath. 45 mL 1 04/13/2018 at Unknown time  . guaiFENesin (MUCINEX) 600 MG 12 hr tablet Take 1 tablet (600 mg total) by mouth 2 (two) times daily. 30 tablet 5 04/13/2018 at Unknown time  . pantoprazole (PROTONIX) 40 MG tablet Take 1 tablet (40 mg total) by mouth daily. 30 tablet 12 04/13/2018 at Unknown time  . azithromycin (ZITHROMAX Z-PAK) 250 MG tablet Take by package instructions (Patient not taking: Reported on 04/13/2018) 6 each 0 Not Taking at Unknown time   Scheduled: . albuterol  2.5 mg Nebulization BID  . enoxaparin (LOVENOX) injection  30 mg Subcutaneous Q24H  . methylPREDNISolone (SOLU-MEDROL) injection  80 mg Intravenous Q8H  . pantoprazole  40 mg Oral Daily  . umeclidinium-vilanterol  1 puff Inhalation Daily   Continuous: . azithromycin Stopped (04/15/18 1027)   OZD:[id number] **OR** acetaminophen, albuterol  Assesment: She has COPD exacerbation.  She  has been tachycardic.  This happened fairly abruptly so I am going to go ahead and get a CT angiogram to be sure she does not have pulmonary emboli. Principal Problem:   COPD exacerbation ([place]) Active Problems:   Tachycardia    Plan: Continue other treatments.  Check CT angiogram as mentioned.    LOS: 1 day   Katelyn [name] 04/15/2018, 8:40 AM

## 2018-04-15 NOTE — Progress Notes (Signed)
Pt ambulated approx. 150 feet in hallway. Tolerated well. Pt c/o headache on the left side right behind eye when she returned from walk. States she didn't want to try any pain medication at the moment, but cold cloth applied to area per pt request. Will continue to monitor.

## 2018-04-16 ENCOUNTER — Other Ambulatory Visit: Payer: Self-pay | Admitting: *Deleted

## 2018-04-16 MED ORDER — PREDNISONE 10 MG (21) PO TBPK: ORAL_TABLET | ORAL | 1 refills | Status: DC

## 2018-04-16 MED ORDER — AZITHROMYCIN 250 MG PO TABS: ORAL_TABLET | ORAL | 0 refills | Status: DC

## 2018-04-16 MED ORDER — UMECLIDINIUM-VILANTEROL 62.5-25 MCG/INH IN AEPB: 1.0000 | INHALATION_SPRAY | Freq: Every day | RESPIRATORY_TRACT | 12 refills | Status: DC

## 2018-04-16 NOTE — Progress Notes (Signed)
Subjective: She says she feels better and wants to go home  Objective: Vital signs in last 24 hours: Temp:  [97.9 F (36.6 C)-98.3 F (36.8 C)] 98 F (36.7 C) (04/25 0520) Pulse Rate:  [69-93] 69 (04/25 0520) Resp:  [18-20] 20 (04/24 2137) BP: (115-117)/(61-78) 116/78 (04/25 0520) SpO2:  [92 %-97 %] 94 % (04/25 0806) Weight change:  Last BM Date: 04/16/18  Intake/Output from previous day: 04/24 0701 - 04/25 0700 In: 1090 [P.O.:840; IV Piggyback:250] Out: -   PHYSICAL EXAM General appearance: alert, cooperative and no distress Resp: clear to auscultation bilaterally Cardio: regular rate and rhythm, S1, S2 normal, no murmur, click, rub or gallop GI: soft, non-tender; bowel sounds normal; no masses,  no organomegaly Extremities: extremities normal, atraumatic, no cyanosis or edema  Lab Results:  Results for orders placed or performed during the hospital encounter of 04/13/18 (from the past 48 hour(s))  Troponin I     Status: None   Collection Time: 04/14/18  2:28 PM  Result Value Ref Range   Troponin I <0.03 <0.03 ng/mL    Comment: Performed at [facility], [address], [place], [place] [address]    ABGS Recent Labs    04/14/18 0033  PHART 7.316*  PO2ART 115*  HCO3 24.4   CULTURES Recent Results (from the past 240 hour(s))  MRSA PCR Screening     Status: None   Collection Time: 04/14/18  2:20 AM  Result Value Ref Range Status   MRSA by PCR NEGATIVE NEGATIVE Final    Comment:        The GeneXpert MRSA Assay (FDA approved for NASAL specimens only), is one component of a comprehensive MRSA colonization surveillance program. It is not intended to diagnose MRSA infection nor to guide or monitor treatment for MRSA infections. Performed at [facility], [address], [place], [place] [address]    Studies/Results: Ct Angio Chest Pe W Or Wo Contrast  Result Date: 04/15/2018 CLINICAL DATA:  77 year old female with shortness of breath and cough. EXAM: CT  ANGIOGRAPHY CHEST WITH CONTRAST TECHNIQUE: Multidetector CT imaging of the chest was performed using the standard protocol during bolus administration of intravenous contrast. Multiplanar CT image reconstructions and MIPs were obtained to evaluate the vascular anatomy. CONTRAST:  163mL ISOVUE-370 IOPAMIDOL (ISOVUE-370) INJECTION 76% COMPARISON:  Prior CT scan of the chest 10/19/2016 FINDINGS: Cardiovascular: Adequate opacification of the pulmonary arteries to the proximal subsegmental level. No evidence of central filling defect to suggest acute pulmonary embolus. The main pulmonary artery is normal in size. The heart is within normal limits in size. No pericardial effusion. Normal caliber thoracic aorta with trace atherosclerotic vascular calcifications. Mediastinum/Nodes: Unremarkable CT appearance of the thyroid gland. No suspicious mediastinal or hilar adenopathy. No soft tissue mediastinal mass. The thoracic esophagus is unremarkable. Lungs/Pleura: Mild biapical pleuroparenchymal scarring. Stable 5 mm stellate focus of ground-glass attenuation opacity in the posterior right upper lobe dating back to October of 2017. Additional scattered tiny 2 mm bilateral upper lobe pulmonary nodules also remain unchanged consistent with benignity. There is a background of moderate centrilobular pulmonary emphysema and mild bronchial wall thickening. No pneumonia or pleural effusion. No pneumothorax. Upper Abdomen: No acute abnormality. Musculoskeletal: No chest wall abnormality. No acute or significant osseous findings. Review of the MIP images confirms the above findings. IMPRESSION: 1. Negative for acute pulmonary embolus, pneumonia or other acute cardiopulmonary process. 2. Moderate centrilobular pulmonary emphysema. 3. Stable bilateral upper lobe pulmonary nodules dating back to 10/19/2016 consistent with a benign  process such as scarring and/or old granulomatous disease. Aortic Atherosclerosis (ICD10-I70.0) and Emphysema  (ICD10-J43.9). Electronically Signed   By: Jacqulynn Cadet M.D.   On: 04/15/2018 08:55    Medications:  Prior to Admission:  Medications Prior to Admission  Medication Sig Dispense Refill Last Dose  . albuterol (PROVENTIL HFA;VENTOLIN HFA) 108 (90 BASE) MCG/ACT inhaler Inhale 2 puffs into the lungs every 6 (six) hours as needed for wheezing or shortness of breath. 1 Inhaler 1 04/13/2018 at Unknown time  . albuterol (PROVENTIL) (2.5 MG/3ML) 0.083% nebulizer solution Take 3 mLs (2.5 mg total) by nebulization every 4 (four) hours as needed for wheezing or shortness of breath. 45 mL 1 04/13/2018 at Unknown time  . guaiFENesin (MUCINEX) 600 MG 12 hr tablet Take 1 tablet (600 mg total) by mouth 2 (two) times daily. 30 tablet 5 04/13/2018 at Unknown time  . pantoprazole (PROTONIX) 40 MG tablet Take 1 tablet (40 mg total) by mouth daily. 30 tablet 12 04/13/2018 at Unknown time  . azithromycin (ZITHROMAX Z-PAK) 250 MG tablet Take by package instructions (Patient not taking: Reported on 04/13/2018) 6 each 0 Not Taking at Unknown time   Scheduled: . albuterol  2.5 mg Nebulization BID  . enoxaparin (LOVENOX) injection  30 mg Subcutaneous Q24H  . loratadine  10 mg Oral Daily  . methylPREDNISolone (SOLU-MEDROL) injection  80 mg Intravenous Q8H  . pantoprazole  40 mg Oral Daily  . umeclidinium-vilanterol  1 puff Inhalation Daily   Continuous: . azithromycin 500 mg (04/16/18 0550)   NTZ:[id number] **OR** acetaminophen, albuterol  Assesment: She was admitted with COPD exacerbation and she feels better.  She has no new complaints.  She wants to go home. Principal Problem:   COPD exacerbation ([place]) Active Problems:   Tachycardia    Plan: Discharge home today    LOS: 2 days   Porschea Borys L 04/16/2018, 8:27 AM

## 2018-04-16 NOTE — Care Management Note (Addendum)
Case Management Note  Patient Details  Name: Katelyn [name] MRN: [id number] Date of Birth: [birth year]  Subjective/Objective:      Pt well known as she has had many admissions for COPD. Pt from home, ind, has insurance, PCP and transportation. Pt has no been referred for OCPD program in the past d/t not being home-bound.               Action/Plan: DC home today. Pt offered COPD program d/t frequent admission and pt refuses, says she will do better next time and not try to treat herself. Pt's THN status says pending. CM has reached out to [facility] rep for clarification.  Expected Discharge Date:  04/16/18               Expected Discharge Plan:  Home/Self Care  In-House Referral:  NA  Discharge planning Services  CM Consult  Post Acute Care Choice:  NA Choice offered to:  NA  Status of Service:  Completed, signed off  Sherald Barge, RN 04/16/2018, 10:25 AM

## 2018-04-16 NOTE — Care Management Important Message (Signed)
Important Message  Patient Details  Name: NOYA SANTARELLI MRN: [id number] Date of Birth: 11-08-1941   Medicare Important Message Given:  Yes    Sherald Barge, RN 04/16/2018, 9:26 AM

## 2018-04-16 NOTE — Patient Outreach (Signed)
[facility]) Care Management  04/16/2018  Katelyn [name] Apr 30, 1941 [id number]  [name] is a 77 year old female with medical history which includes COPD, ongoing cigarette smoking, GERD, and malnutrition. [name] presented to the emergency room on 04/13/18 after developing shortness of breath. [name] was admitted to the hospital and treated with IV steroids, antibiotics, and inhaled bronchodilators for COPD exacerbation. She was discharged from the hospital today.  [name] was referred to [place] [facility] for transition of care services and I reached out to her this afternoon by phone. I was unable to contact [name] but left a HIPPA compliant voice message requesting a return call.   Plan: I will reach out to [name] again tomorrow.    [place] [facility]  [phone]

## 2018-04-16 NOTE — Progress Notes (Signed)
Pt Iv removed, WNL. D/C instructions given to pt, verbalized understanding. Pt ambulated to lobby to meet son who is taking her home.

## 2018-04-16 NOTE — Discharge Summary (Signed)
Physician Discharge Summary  Patient ID: Katelyn [name] MRN: [id number] DOB/AGE: 09-12-1941 77 y.o. Primary Care Physician:Ajna Moors, Ramon Dredge, MD Admit date: 04/13/2018 Discharge date: 04/16/2018    Discharge Diagnoses:   Principal Problem:   COPD exacerbation [facility]) Active Problems:   Tachycardia Nicotine abuse GERD Malnutrition  Allergies as of 04/16/2018   No Known Allergies     Medication List    TAKE these medications   albuterol (2.5 MG/3ML) 0.083% nebulizer solution Commonly known as:  PROVENTIL Take 3 mLs (2.5 mg total) by nebulization every 4 (four) hours as needed for wheezing or shortness of breath.   albuterol 108 (90 Base) MCG/ACT inhaler Commonly known as:  PROVENTIL HFA;VENTOLIN HFA Inhale 2 puffs into the lungs every 6 (six) hours as needed for wheezing or shortness of breath.   azithromycin 250 MG tablet Commonly known as:  ZITHROMAX Z-PAK Take by package instructions   guaiFENesin 600 MG 12 hr tablet Commonly known as:  MUCINEX Take 1 tablet (600 mg total) by mouth 2 (two) times daily.   pantoprazole 40 MG tablet Commonly known as:  PROTONIX Take 1 tablet (40 mg total) by mouth daily.   predniSONE 10 MG (21) Tbpk tablet Commonly known as:  STERAPRED UNI-PAK 21 TAB Take by package instructions   umeclidinium-vilanterol 62.5-25 MCG/INH Aepb Commonly known as:  ANORO ELLIPTA Inhale 1 puff into the lungs daily. Start taking on:  04/17/2018       Discharged Condition: Improved    Consults: None  Significant Diagnostic Studies: Dg Chest 2 View  Result Date: 04/13/2018 CLINICAL DATA:  Shortness of breath EXAM: CHEST - 2 VIEW COMPARISON:  03/29/2018 FINDINGS: Hyperinflation with emphysematous disease. No acute pulmonary infiltrate or effusion. Borderline cardiomegaly. Aortic atherosclerosis. No pneumothorax. Surgical clips in the right upper quadrant IMPRESSION: No active cardiopulmonary disease. Hyperinflation with emphysematous disease  Electronically Signed   By: Jasmine Pang M.D.   On: 04/13/2018 23:42   Dg Chest 2 View  Result Date: 03/29/2018 CLINICAL DATA:  Short of breath, smoker EXAM: CHEST - 2 VIEW COMPARISON:  03/27/2018 FINDINGS: COPD with hyperinflation and pulmonary scarring. No superimposed infiltrate or effusion. Negative for heart failure or effusion. No mass lesion. IMPRESSION: COPD with scarring.  No acute abnormality. Electronically Signed   By: Marlan Palau M.D.   On: 03/29/2018 13:44   Dg Chest 2 View  Result Date: 03/27/2018 CLINICAL DATA:  Intermittent shortness of breath for a couple of weeks, worse last night. EXAM: CHEST - 2 VIEW COMPARISON:  02/02/2018 FINDINGS: The cardiomediastinal silhouette is within normal limits. The lungs remain hyperinflated. No airspace consolidation, edema, pleural effusion, or pneumothorax is identified. No acute osseous abnormality is identified. IMPRESSION: COPD without evidence of active cardiopulmonary disease. Electronically Signed   By: Sebastian Ache M.D.   On: 03/27/2018 12:12   Ct Angio Chest Pe W Or Wo Contrast  Result Date: 04/15/2018 CLINICAL DATA:  77 year old female with shortness of breath and cough. EXAM: CT ANGIOGRAPHY CHEST WITH CONTRAST TECHNIQUE: Multidetector CT imaging of the chest was performed using the standard protocol during bolus administration of intravenous contrast. Multiplanar CT image reconstructions and MIPs were obtained to evaluate the vascular anatomy. CONTRAST:  ISOVUE-370 IOPAMIDOL (ISOVUE-370) INJECTION 76% COMPARISON:  Prior CT scan of the chest 10/19/2016 FINDINGS: Cardiovascular: Adequate opacification of the pulmonary arteries to the proximal subsegmental level. No evidence of central filling defect to suggest acute pulmonary embolus. The main pulmonary artery is normal in size. The heart is within normal limits in  size. No pericardial effusion. Normal caliber thoracic aorta with trace atherosclerotic vascular calcifications.  Mediastinum/Nodes: Unremarkable CT appearance of the thyroid gland. No suspicious mediastinal or hilar adenopathy. No soft tissue mediastinal mass. The thoracic esophagus is unremarkable. Lungs/Pleura: Mild biapical pleuroparenchymal scarring. Stable 5 mm stellate focus of ground-glass attenuation opacity in the posterior right upper lobe dating back to October of 2017. Additional scattered tiny 2 mm bilateral upper lobe pulmonary nodules also remain unchanged consistent with benignity. There is a background of moderate centrilobular pulmonary emphysema and mild bronchial wall thickening. No pneumonia or pleural effusion. No pneumothorax. Upper Abdomen: No acute abnormality. Musculoskeletal: No chest wall abnormality. No acute or significant osseous findings. Review of the MIP images confirms the above findings. IMPRESSION: 1. Negative for acute pulmonary embolus, pneumonia or other acute cardiopulmonary process. 2. Moderate centrilobular pulmonary emphysema. 3. Stable bilateral upper lobe pulmonary nodules dating back to 10/19/2016 consistent with a benign process such as scarring and/or old granulomatous disease. Aortic Atherosclerosis (ICD10-I70.0) and Emphysema (ICD10-J43.9). Electronically Signed   By: Malachy Moan M.D.   On: 04/15/2018 08:55    Lab Results: Basic Metabolic Panel: Recent Labs    04/13/18 2226 04/14/18 0242  NA 141 142  K 3.8 3.5  CL 101 103  CO2 28 26  GLUCOSE 105* 157*  BUN 19 22*  CREATININE 1.20* 1.07*  CALCIUM 9.7 9.3   Liver Function Tests: Recent Labs    04/14/18 0242  AST 27  ALT 19  ALKPHOS 80  BILITOT 0.6  PROT 6.4*  ALBUMIN 3.9     CBC: Recent Labs    04/13/18 2226 04/14/18 0242  WBC 9.8 15.5*  HGB 11.4* 10.7*  HCT 36.3 34.0*  MCV 83.4 83.3  PLT 378 341    Recent Results (from the past 240 hour(s))  MRSA PCR Screening     Status: None   Collection Time: 04/14/18  2:20 AM  Result Value Ref Range Status   MRSA by PCR NEGATIVE NEGATIVE  Final    Comment:        The GeneXpert MRSA Assay (FDA approved for NASAL specimens only), is one component of a comprehensive MRSA colonization surveillance program. It is not intended to diagnose MRSA infection nor to guide or monitor treatment for MRSA infections. Performed at [facility], [address], [place], [place] [address]      Hospital Course: This is a 77 year old with a long known history of COPD and ongoing cigarette smoking who came to the emergency room after developing shortness of breath.  She initially told me that this occurred abruptly but as her hospitalization went on she said that she had been sick for 2 or 3 days and should have sought medical attention before she came to the emergency department.  She was started on intravenous steroids given antibiotics inhaled bronchodilators and improved.  She had CT of the chest to make sure she did not have pulmonary emboli and this was negative.  She had tachycardia thought to be related to her pulmonary disease.  By the time of discharge she was back at baseline  Discharge Exam: Blood pressure 116/78, pulse 69, temperature 98 F (36.7 C), temperature source Oral, resp. rate 20, height 5\' 5"  (1.651 m), weight 43.3 kg (95 lb 7.4 oz), SpO2 94 %. She is awake and alert.  She is very thin.  Her lungs are clear with prolonged expiratory phase  Disposition: Discharge home.      Signed: Fredirick Maudlin   04/16/2018, 8:33  AM

## 2018-04-17 ENCOUNTER — Other Ambulatory Visit: Payer: Self-pay | Admitting: *Deleted

## 2018-04-17 NOTE — Patient Outreach (Signed)
[facility]) Care Management  04/17/2018  Katelyn [name] 01-24-1941 [id number]  [name] is a 77 year old female with medical history which includes COPD, ongoing cigarette smoking, GERD, and malnutrition. [name] presented to the emergency room on 04/13/18 after developing shortness of breath. [name] was admitted to the hospital and treated with IV steroids, antibiotics, and inhaled bronchodilators for COPD exacerbation. She was discharged from the hospital today.  [name] was referred to [facility] for transition of care services. [name] and I exchanged a few voice messages and were finally able to speak today. She say she feels she is doing better and generally improved from her recent COPD exacerbation although she doesn't feel "100%". [name] and I reviewed her discharge instructions, EARLY signs and symptoms of respiratory illness and when to call her provider, need for PCP post hospital follow up appointment, and her medications.   Medication Adherence Barriers - [name] states she is taking medications as prescribed including azithromycin 250mg  but she did not have her prescription for Anoro Ellipta filled because the out of pocket cost for her is $90. I referred [name] to our [place] team for medication management assistance and assessment for manufacturer and assistance application/eligibility. [name] says she has attempted to apply for LIS/Medicare "Extra Help" and Medicaid in the past and was told her income exceeds eligibility requirements.    I reached out to the office of Dr. Sinda [name] [phone]) and left a message with Katelyn Melnick LPN requesting that Dr. Luan [name] be notifed of [name]'s medication adherence barriers as outlined above.   Provider Follow Up - [name] does not currently have a post hospital follow up provider appointment; I reached out to the office of Dr. Sinda [name] who is her PCP and  pulmonary provider to request an appointment.   Plan: I will follow up with [name] by phone next week.   THN CM Care Plan Problem One     Most Recent Value  Care Plan Problem One  Medication Management and Assistance Concerns  Role Documenting the Problem One  Care Management Coordinator  Care Plan for Problem One  Active  THN Long Term Goal   Over the next 31 days, patient will verbalie understanding of medication management and patient assistance options available to her  [place] Goal Start Date  04/17/18  Interventions for Problem One Long Term Goal  medications reviewed,  discussed outreach to PCP for consideration of medication regimen,  referred to [place] team for patient medication assistance evalutation   THN CM Short Term Goal #1   Over the next 7 days, patient will work with [place] team to address medication adherence barriers  [facility] CM Short Term Goal #1 Start Date  04/17/18  Interventions for Short Term Goal #1  referred to [place] team  [facility] CM Short Term Goal #2   Over the next 7 days, patient will verbalize understanding of provider plan of care re: medication mangaement of COPD/respiratory illness  THN CM Short Term Goal #2 Start Date  04/17/18  Interventions for Short Term Goal #2  reviewed medications,  outreach to PCP/Pulmonary team re: medication adherence barriers    [facility] CM Care Plan Problem Two     Most Recent Value  Care Plan Problem Two  Knowledge Deficits related to long term self management of COPD  Role Documenting the Problem Two  Care Management  Coordinator  Care Plan for Problem Two  Active  Interventions for Problem Two Long Term Goal   reviewed discharge instructions, performed transition of care assessment,  reviewed provider follow up plans,  medications reviewed  THN Long Term Goal  Over the next 31 days, patient will verbalize understanding of long term plan of care for self health management of COPD  THN Long Term Goal  Start Date  04/17/18  THN CM Short Term Goal #1   Over the next 14 days, patient will attend scheduled provider post hospital follow up appointment  [facility] CM Short Term Goal #1 Start Date  04/17/18  Interventions for Short Term Goal #2   outreach to PCP/Pulmonary office to request post hospital follow up appointment  [facility] CM Short Term Goal #2   Over the next 30 days, patient will verbalize understanding of provider plan of care for pharmaceutical management of COPD exacerbation   THN CM Short Term Goal #2 Start Date  04/17/18  Interventions for Short Term Goal #2  medications reviewed: provider outreach,  [facility] team referral   [facility] CM Short Term Goal #3   Over the next 30 days, patient will verbalize EARLY signs and symptoms of respiratory illness and/or compromise and when to call provider for same  [facility] CM Short Term Goal #3 Start Date  04/17/18  Interventions for Short Term Goal #3  [facility] educational materials prescribed/sent,  reviewed EARLY signs and symptoms of respiratory illness/when to contact provider for assistance      [place] Management  [phone]

## 2018-04-20 ENCOUNTER — Other Ambulatory Visit: Payer: Self-pay

## 2018-04-20 NOTE — Patient Outreach (Signed)
[facility]) Care Management  Katelyn [name]   04/20/2018  Katelyn [name] October 02, 1941 [id number]  77 year old female referred to [facility] for medication assistance.  [place] services requested for Anoro Ellipta.  PMHx includes, but not limited to, COPD, dysphagia, duodenal ulcer, esophagitis.  Successful outreach call placed to Katelyn [name].  HIPAA identifiers verified.   Subjective: Katelyn [name] states that her new prescription Anoro Ellipta cost her $90/mo and that she cannot afford to pay that price.  She states that she does have the inhaler for the current month because the pharmacy delivered it, so she felt obligated to pay and not refuse it.  Patient states that she can afford her other medications.  Patient reports that she "will not get this prescription filled next month, no matter what will happen to me."  Objective: labs 04/14/18 SCr: 1.07 mg/dL BMI 15.9 Hg 10.7g/dL  Current Medications: Current Outpatient Medications  Medication Sig Dispense Refill  . albuterol (PROVENTIL HFA;VENTOLIN HFA) 108 (90 BASE) MCG/ACT inhaler Inhale 2 puffs into the lungs every 6 (six) hours as needed for wheezing or shortness of breath. 1 Inhaler 1  . azithromycin (ZITHROMAX Z-PAK) 250 MG tablet Take by package instructions 6 each 0  . Dextromethorphan-guaiFENesin (GUAIFENESIN DM PO) Take 1 tablet by mouth daily as needed. For sinuses and may take twice daily if needed.    . Multiple Vitamins-Minerals (MULTIVITAMIN ADULT) CHEW Chew 2 each by mouth daily. Vitafusion gummy    . pantoprazole (PROTONIX) 40 MG tablet Take 1 tablet (40 mg total) by mouth daily. 30 tablet 12  . predniSONE (STERAPRED UNI-PAK 21 TAB) 10 MG (21) TBPK tablet Take by package instructions 21 tablet 1  . umeclidinium-vilanterol (ANORO ELLIPTA) 62.5-25 MCG/INH AEPB Inhale 1 puff into the lungs daily. 1 each 12  . albuterol (PROVENTIL) (2.5 MG/3ML) 0.083% nebulizer solution Take 3 mLs (2.5 mg total)  by nebulization every 4 (four) hours as needed for wheezing or shortness of breath. (Patient not taking: Reported on 04/20/2018) 45 mL 1   No current facility-administered medications for this visit.     Functional Status: In your present state of health, do you have any difficulty performing the following activities: 04/14/2018 03/29/2018  Hearing? N N  Vision? N N  Difficulty concentrating or making decisions? Y N  Walking or climbing stairs? N N  Comment - -  Dressing or bathing? N N  Doing errands, shopping? N Y  Some recent data might be hidden    Fall/Depression Screening: Fall Risk  04/17/2018  Falls in the past year? No   PHQ 2/9 Scores 04/17/2018  PHQ - 2 Score 0   ASSESSMENT: Date Discharged from Hospital: 04/16/18 Date Medication Reconciliation Performed: 04/20/2018  New Medications at Discharge:  Prednsione 10mg  21 day dosepak  Azithromycn  Z-Pak  Anoro Ellipta Inhaler  Patient was recently discharged from hospital and all medications have been reviewed  Drugs sorted by system:  Pulmonary/Allergy: albuterol, dextromehorphan/guaifenesin, prednisone umeclidinium/vilanterol inhaler  Gastrointestinal: pantoprazole   Vitamins/Minerals: MVI  Infectious Diseases: azithromycin  Medication Assistance: Per financial discussion, patient does not qualify for Extra Help LIS.  She has prescription benefits through [employer].  I called them today and they state that they do not have a formulary, but that the customer pays a percentage of the prescription price (20%).  They state that they are considered as primary prescription insurance through Medicare.   Patient is not eligible for Anoro coupon card because  she is over 73 years old.   She would need to spend $600 out-of-pocket to apply for [place] patient assistance program.  Per the representative at her online pharmacy, Stiolto Respimat (tiotropium/oldaterol) is available for < $40/mo.   Plan: Route  note to PCP,  Dr. Luan Pulling, to see if he has samples of Anoro Ellipta to give the patient or if he would like to change her prescription to something more affordable like Stiolto Respimat.  Joetta Manners, PharmD Clinical Pharmacist [place] [phone]

## 2018-04-22 DIAGNOSIS — K210 Gastro-esophageal reflux disease with esophagitis: Secondary | ICD-10-CM | POA: Diagnosis not present

## 2018-04-22 DIAGNOSIS — E46 Unspecified protein-calorie malnutrition: Secondary | ICD-10-CM | POA: Diagnosis not present

## 2018-04-22 DIAGNOSIS — F17200 Nicotine dependence, unspecified, uncomplicated: Secondary | ICD-10-CM | POA: Diagnosis not present

## 2018-04-22 DIAGNOSIS — J449 Chronic obstructive pulmonary disease, unspecified: Secondary | ICD-10-CM | POA: Diagnosis not present

## 2018-04-23 ENCOUNTER — Ambulatory Visit: Payer: Self-pay

## 2018-04-23 ENCOUNTER — Other Ambulatory Visit: Payer: Self-pay | Admitting: *Deleted

## 2018-04-23 ENCOUNTER — Other Ambulatory Visit: Payer: Self-pay

## 2018-04-23 NOTE — Patient Outreach (Signed)
Katelyn [name]) Care Management  04/23/2018  Katelyn [name] Oct 28, 1941 [id number]  [name] is a75 year oldfemalewithmedical history which includesCOPD, ongoing cigarette smoking, GERD, and malnutrition. [name] presentedto the emergency roomon 4/22/19after developing shortness of breath. [name] was admitted to the hospital and treated with IV steroids, antibiotics, and inhaled bronchodilators for COPD exacerbation.She was discharged from the hospital today.  [name] was referred to [facility] Management for transition of care services. Today is TOC outreach #2. I was unable to reach [name] by phone and left a voice message requesting a return call. I was able to collaborate with our [place] Pharmacist as outlined below.   Medication Adherence Barriers - I reached out to Dr. Sinda Du as did [facility] Pharmacist Joetta Manners re: cost barriers related to her Anoro Ellipta inhaler($90/month) prescribed at hospital discharge. Dr. Luan Pulling changed [name]'s inhaler to Stiolto ($10/month). [name] has had the prescription filled and picked it up. She is completing the Anoro Ellipta inhaler provided to her at hospital discharge and will start [place] thereafter.   Provider Follow Up - I reached out to the office of Dr. Luan Pulling last week, leaving a message with Marga Melnick LPN requesting assistance with a post hospital follow up appointment for [name]. I was unable to reach [name] by phone or get through to Dr. Luan Pulling' office (possible telephone line difficulties) to see if an appointment has been made. I will follow up again by phone later today or tomorrow to inquire about this appointment.     Plan: I will follow up with [name] by phone.    [facility]  [phone]

## 2018-04-23 NOTE — Patient Outreach (Addendum)
[employer] [facility]) Care Management  04/23/2018  Katelyn [name] 08-Sep-1941 [id number]  77 year old female referred to [facility] for medication assistance.  [place] services requested for Anoro Ellipta.  PMHx includes, but not limited to, COPD, dysphagia, duodenal ulcer, esophagitis.  Successful outreach call placed to Katelyn [name]. HIPAA identifiers verified.   Subjective: Katelyn [name] states that a new inhaler was called into her pharmacy and that it only cost $10.  She reports being very happy that it was so affordable.  She states that she has already picked it up, but will finish using the Anoro Ellipta before she starts the new inhaler.  Patient states that she has no other medications issues.  Assessment: Verified with [place] that Stiolto inhaler had been called in for patient for $10/mo by PCP.  This medication is in the same drug class (LAMA/LABA) as the previously prescribed Anoro Ellipta.  I informed the patient that I will close her [place] case at this time because our goal of medication assistance with her inhaler has been met.   Patient informed that she can call me back in the future if other medication issues arise.  Plan: Route discipline closure letter to PCP, Dr. Luan Pulling.  Notify [facility] RN, Janalyn Shy of Pharmacy case closure.  Katelyn [name], PharmD Clinical Pharmacist [place] [phone]

## 2018-04-24 ENCOUNTER — Other Ambulatory Visit: Payer: Self-pay | Admitting: *Deleted

## 2018-04-24 NOTE — Patient Outreach (Signed)
[facility]) Care Management  04/24/2018  HARSIMRAN WESTMAN 11/25/1941 [id number]  Unable to reach [name] by phone today.   Plan: I will follow up with [name] next week.    [place] Management  [phone]

## 2018-04-27 ENCOUNTER — Other Ambulatory Visit: Payer: Self-pay | Admitting: *Deleted

## 2018-04-27 NOTE — Patient Outreach (Signed)
[facility]) Care Management  04/27/2018  CECILLE MCCLUSKY [birth year] [id number]  Unable to reach [name] by phone today.   Plan: I will follow up with [name] by phone later this week.    [place] Management  [phone]

## 2018-04-30 ENCOUNTER — Other Ambulatory Visit: Payer: Self-pay | Admitting: *Deleted

## 2018-04-30 NOTE — Patient Outreach (Signed)
[facility]) Care Management  04/30/2018  Katelyn [name] September 24, 1941 [id number]  Katelyn [name] App is a32 year oldfemalewithmedical history which includesCOPD, ongoing cigarette smoking, GERD, and malnutrition. Katelyn [name] presentedto the emergency roomon 4/22/19after developing shortness of breath. Katelyn [name] was admitted to the hospital and treated with IV steroids, antibiotics, and inhaled bronchodilators for COPD exacerbation.She was discharged from the hospital today.  Katelyn [name] was referred to [facility] Management for transition of care services. I was unsuccessful in reaching Katelyn [name] again today for [facility] outreach #3 (2 previous failed attempts after outreach #2).  Plan: I will send a letter to the home of Katelyn [name] to inquire about her ongoing interest in [place] Management services.    [place] [facility]  [phone]

## 2018-05-12 DIAGNOSIS — J329 Chronic sinusitis, unspecified: Secondary | ICD-10-CM | POA: Diagnosis not present

## 2018-05-12 DIAGNOSIS — F17200 Nicotine dependence, unspecified, uncomplicated: Secondary | ICD-10-CM | POA: Diagnosis not present

## 2018-05-12 DIAGNOSIS — J441 Chronic obstructive pulmonary disease with (acute) exacerbation: Secondary | ICD-10-CM | POA: Diagnosis not present

## 2018-05-12 DIAGNOSIS — E46 Unspecified protein-calorie malnutrition: Secondary | ICD-10-CM | POA: Diagnosis not present

## 2018-05-21 ENCOUNTER — Other Ambulatory Visit: Payer: Self-pay | Admitting: *Deleted

## 2018-05-21 NOTE — Patient Outreach (Signed)
[facility]) Care Management  05/21/2018  Katelyn [name] [birth year] [id number]  [name] is a44 year oldfemalewithmedical history which includesCOPD, ongoing cigarette smoking, GERD, and malnutrition. [name] presentedto the emergency roomon 4/22/19after developing shortness of breath. [name] was admitted to the hospital and treated with IV steroids, antibiotics, and inhaled bronchodilators for COPD exacerbation.  [name] was referred to [facility] Management for transition of care services. Our team was briefly able to engage [name] but have been unable to maintain contact with her after several outreach attempts.   Plan: I will notify [name] primary care provider, Dr. Sinda Du of her case closure and will send a letter to [name] indicating the case closure.    [facility] Management  [phone]

## 2018-05-25 ENCOUNTER — Ambulatory Visit: Payer: Medicare Other | Admitting: Gastroenterology

## 2018-05-25 DIAGNOSIS — M542 Cervicalgia: Secondary | ICD-10-CM | POA: Diagnosis not present

## 2018-05-25 DIAGNOSIS — E46 Unspecified protein-calorie malnutrition: Secondary | ICD-10-CM | POA: Diagnosis not present

## 2018-05-25 DIAGNOSIS — J449 Chronic obstructive pulmonary disease, unspecified: Secondary | ICD-10-CM | POA: Diagnosis not present

## 2018-05-25 DIAGNOSIS — F17200 Nicotine dependence, unspecified, uncomplicated: Secondary | ICD-10-CM | POA: Diagnosis not present

## 2018-05-28 ENCOUNTER — Other Ambulatory Visit: Payer: Self-pay | Admitting: *Deleted

## 2018-05-29 NOTE — Patient Outreach (Signed)
[facility]) Care Management  05/28/2018  Katelyn [name] November 18, 1941 [id number]  Katelyn [name] is a14 year oldfemalewithmedical history which includesCOPD, ongoing cigarette smoking, GERD, and malnutrition. Katelyn [name] presentedto the emergency roomon 4/22/19after developing shortness of breath. Katelyn [name] was admitted to the hospital and treated with IV steroids, antibiotics, and inhaled bronchodilators for COPD exacerbation.She was discharged from the hospital on 04/16/18.  Katelyn [name] was referred to [facility] for transition of care services. Katelyn [name] and I had a few conversations but then lost touch when I was unable to continue to make contact with her. I sent a letter to her home and she reached out to ask if she could continue monthly health coaching services.   Today, Katelyn [name] says she has had some difficulty with breathing which she attributes to the humid weather. She says she took a breathing treatment and is already feeling better. She has an excellent knowledge of her signs and symptoms and of her medications.   Katelyn [name] and I reviewed EARLY signs and symptoms of respiratory illness and when to call her provider.   Plan: We will continue to reach out to Katelyn [name] monthly for ongoing health coaching for COPD.   [facility] CM Care Plan Problem One     Most Recent Value  Care Plan Problem One  Knowledge Deficits related to long term mangaement of chronic disease (COPD)  Role Documenting the Problem One  Health Coach  Care Plan for Problem One  Active  THN Long Term Goal   Over the next 60 days, patient will verbalize understanding of long term plan of care for ongoing management of COPD  [facility] Long Term Goal Start Date  05/28/18  Interventions for Problem One Long Term Goal  reviewed overall plan of care for COPD disease management  [facility] CM Short Term Goal #1   Over the next 30 days, patient will take all medications as prescribed including  breathing treatments as early as possible when respiraotry dificulty noted  [facility] CM Short Term Goal #1 Start Date  05/28/18  Interventions for Short Term Goal #1  reviewed medication and imortance of early intervention for shortness of breath  [facility] CM Short Term Goal #2   Over the next 30 days, patient will report any new or worsened respiratory symptom as soon as noted  [facility] CM Short Term Goal #2 Start Date  05/28/18  Interventions for Short Term Goal #2  reviewed early signs and s ymptoms of respiratory illness      [employer] Care Management  [phone]

## 2018-05-30 ENCOUNTER — Other Ambulatory Visit: Payer: Self-pay

## 2018-05-30 ENCOUNTER — Encounter (HOSPITAL_COMMUNITY): Payer: Self-pay

## 2018-05-30 ENCOUNTER — Emergency Department (HOSPITAL_COMMUNITY): Payer: Medicare Other

## 2018-05-30 ENCOUNTER — Emergency Department (HOSPITAL_COMMUNITY)
Admission: EM | Admit: 2018-05-30 | Discharge: 2018-05-30 | Disposition: A | Discharge status: Home / Self Care | Payer: Medicare Other | Attending: Emergency Medicine | Admitting: Emergency Medicine

## 2018-05-30 DIAGNOSIS — R0602 Shortness of breath: Secondary | ICD-10-CM | POA: Diagnosis not present

## 2018-05-30 DIAGNOSIS — Z79899 Other long term (current) drug therapy: Secondary | ICD-10-CM | POA: Insufficient documentation

## 2018-05-30 DIAGNOSIS — F17210 Nicotine dependence, cigarettes, uncomplicated: Secondary | ICD-10-CM | POA: Diagnosis not present

## 2018-05-30 DIAGNOSIS — J441 Chronic obstructive pulmonary disease with (acute) exacerbation: Secondary | ICD-10-CM | POA: Diagnosis not present

## 2018-05-30 DIAGNOSIS — I444 Left anterior fascicular block: Secondary | ICD-10-CM | POA: Diagnosis not present

## 2018-05-30 LAB — BASIC METABOLIC PANEL
Anion gap: 8 (ref 5–15)
BUN: 12 mg/dL (ref 6–20)
CALCIUM: 9.3 mg/dL (ref 8.9–10.3)
CO2: 28 mmol/L (ref 22–32)
CREATININE: 0.77 mg/dL (ref 0.44–1.00)
Chloride: 107 mmol/L (ref 101–111)
GFR calc non Af Amer: >60 mL/min (ref >60)
Glucose, Bld: 118 mg/dL — ABNORMAL HIGH (ref 65–99)
Potassium: 3.6 mmol/L (ref 3.5–5.1)
SODIUM: 143 mmol/L (ref 135–145)

## 2018-05-30 LAB — CBC WITH DIFFERENTIAL/PLATELET
BASOS PCT: 1 %
Basophils Absolute: 0.1 10*3/uL (ref 0.0–0.1)
Eosinophils Absolute: 0.8 10*3/uL — ABNORMAL HIGH (ref 0.0–0.7)
Eosinophils Relative: 9 %
HCT: 34 % — ABNORMAL LOW (ref 36.0–46.0)
Hemoglobin: 10.7 g/dL — ABNORMAL LOW (ref 12.0–15.0)
Lymphocytes Relative: 34 %
Lymphs Abs: 3 10*3/uL (ref 0.7–4.0)
MCH: 26.2 pg (ref 26.0–34.0)
MCHC: 31.5 g/dL (ref 30.0–36.0)
MCV: 83.1 fL (ref 78.0–100.0)
MONO ABS: 1 10*3/uL (ref 0.1–1.0)
MONOS PCT: 11 %
NEUTROS PCT: 45 %
Neutro Abs: 4.1 10*3/uL (ref 1.7–7.7)
Platelets: 309 10*3/uL (ref 150–400)
RBC: 4.09 MIL/uL (ref 3.87–5.11)
RDW: 15.6 % — AB (ref 11.5–15.5)
WBC: 8.9 10*3/uL (ref 4.0–10.5)

## 2018-05-30 MED ORDER — METHYLPREDNISOLONE SODIUM SUCC 125 MG IJ SOLR
125.0000 mg | Freq: Once | INTRAMUSCULAR | Status: AC
  Administered 2018-05-30: 125 mg via INTRAVENOUS
  Filled 2018-05-30: qty 2

## 2018-05-30 MED ORDER — IPRATROPIUM-ALBUTEROL 0.5-2.5 (3) MG/3ML IN SOLN
3.0000 mL | Freq: Once | RESPIRATORY_TRACT | Status: AC
  Administered 2018-05-30: 3 mL via RESPIRATORY_TRACT
  Filled 2018-05-30: qty 3

## 2018-05-30 MED ORDER — PREDNISONE 10 MG PO TABS: 10.0000 mg | ORAL_TABLET | Freq: Every day | ORAL | 0 refills | Status: DC

## 2018-05-30 NOTE — ED Provider Notes (Signed)
[facility] EMERGENCY DEPARTMENT Provider Note   CSN: [id number] Arrival date & time: 05/30/18  0715     History   Chief Complaint Chief Complaint  Patient presents with  . Shortness of Breath    HPI Katelyn [name] is a 77 y.o. female.  Patient with a she has used a nebulizer machine with moderate relief.  Known history of COPD presents with worsening dyspnea over the past 2 to 3 days.  She continues to smoke.  She has used her nebulizer machine with moderate relief.  No fever, sweats, chills, chest pain, rusty sputum.  Severity of symptoms is moderate.     Past Medical History:  Diagnosis Date  . Asthma   . Chronic neck pain   . Chronic pain of left elbow   . Complication of anesthesia    hard to wake  . COPD (chronic obstructive pulmonary disease) ([place])   . Dysphagia    requiring multiple dilations in past  . Headache    Patient states she needs  eyeglasses  . Irregular heart beat   . S/P colonoscopy 2009   dr. Gala Romney: anal papilla, 2 cecal AVMs. Due in 2014  . S/P endoscopy 2012   Dr. Gala Romney: mild chronic gastritis, bulbar erosions, s/p 54-French Maloney dilation   . S/P endoscopy 2003, 2004, 2007   gastritis, duodenitis, s/p dilation, +H.pylori     Patient Active Problem List   Diagnosis Date Noted  . Tachycardia 04/14/2018  . Acute gastritis 03/31/2018  . Esophagitis 03/31/2018  . Duodenal ulcer 03/31/2018  . Goals of care, counseling/discussion   . DNR (do not resuscitate) discussion   . Palliative care by specialist   . COPD with acute exacerbation ([place]) 03/29/2018  . COPD (chronic obstructive pulmonary disease) ([place]) 02/03/2018  . Hypoxia 11/28/2015  . COPD exacerbation ([place]) 11/28/2015  . Acute respiratory failure with hypoxia ([place]) 11/28/2015  . SOB (shortness of breath) 11/28/2015  . Mucosal abnormality of stomach   . History of colonic polyps 04/25/2015  . 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 Dysphagia 03/11/2011    Past Surgical History:    Procedure Laterality Date  . APPENDECTOMY    . BIOPSY  03/30/2018   Procedure: BIOPSY;  Surgeon: Daneil Dolin, MD;  Location: [facility] ENDO SUITE;  Service: Gastroenterology;;  gastric  . CHOLECYSTECTOMY    . COLONOSCOPY  10/10/2008   RMR: 1. Anal papilla, otherwise normal rectum. 2. Two cecal arteriovenous malformations, colonic mucosa apperaed normal.   . COLONOSCOPY N/A 05/10/2015   RMR: Rectal and colonic polyps removed as described above. Ascending colon AVMs . Rectal polyps were tubular adenomas. Next colonoscopy May 2021.  . ESOPHAGEAL DILATION N/A 05/10/2015   Procedure: ESOPHAGEAL DILATION;  Surgeon: Daneil Dolin, MD;  Location: [facility] ENDO SUITE;  Service: Endoscopy;  Laterality: N/A;  . ESOPHAGOGASTRODUODENOSCOPY  04/02/2011   RMR: 1. Endoscopically normal -appearing esophagus status post passage of a Maloney dilator followed by biopsy. 2. Hiatal hernia, Mottling, and submucosal petechiae in teh gastric mucoas of uncertain significance status post biopsy. 3. Bulbar erosions as described above.   . ESOPHAGOGASTRODUODENOSCOPY N/A 05/10/2015   RMR: Normal esophagus status post Maloney dilation. Small hiatal hernia. Abnormal gastric because of doubtful clinical significance status post biopsy (benign)  . ESOPHAGOGASTRODUODENOSCOPY (EGD) WITH PROPOFOL N/A 03/30/2018   Procedure: ESOPHAGOGASTRODUODENOSCOPY (EGD) WITH PROPOFOL;  Surgeon: Daneil Dolin, MD;  Location: [facility];  Service: Gastroenterology;  Laterality: N/A;  WITH DILATION   . INGUINAL HERNIA REPAIR    .  MALONEY DILATION  03/30/2018   Procedure: MALONEY DILATION;  Surgeon: Daneil Dolin, MD;  Location: [facility];  Service: Gastroenterology;;  . TONSILLECTOMY    . TOTAL ABDOMINAL HYSTERECTOMY       OB History    Gravida  5   Para  5   Term  4   Preterm  1   AB      Living  4     SAB      TAB      Ectopic      Multiple      Live Births               Home Medications    Prior to Admission  medications   Medication Sig Start Date End Date Taking? Authorizing Provider  albuterol (PROVENTIL HFA;VENTOLIN HFA) 108 (90 BASE) MCG/ACT inhaler Inhale 2 puffs into the lungs every 6 (six) hours as needed for wheezing or shortness of breath. 09/04/15   [name], MD  albuterol (PROVENTIL) (2.5 MG/3ML) 0.083% nebulizer solution Take 3 mLs (2.5 mg total) by nebulization every 4 (four) hours as needed for wheezing or shortness of breath. Patient not taking: Reported on 04/20/2018 09/04/15   [name], MD  azithromycin Azucena Fallen Z-PAK) 250 MG tablet Take by package instructions 04/16/18   Sinda Du, MD  Dextromethorphan-guaiFENesin (GUAIFENESIN DM PO) Take 1 tablet by mouth daily as needed. For sinuses and may take twice daily if needed.    [provider]  Multiple Vitamins-Minerals (MULTIVITAMIN ADULT) CHEW Chew 2 each by mouth daily. Vitafusion gummy    [provider]  pantoprazole (PROTONIX) 40 MG tablet Take 1 tablet (40 mg total) by mouth daily. 03/31/18   Sinda Du, MD  predniSONE (DELTASONE) 10 MG tablet Take 1 tablet (10 mg total) by mouth daily with breakfast. 3 tablets for 3 days, 2 tablets for 3 days, 1 tablet for 3 days. 05/30/18   Nat Christen, MD  umeclidinium-vilanterol [facility] ELLIPTA) 62.5-25 MCG/INH AEPB Inhale 1 puff into the lungs daily. 04/17/18   Sinda Du, MD    Family History Family History  Problem Relation Age of Onset  . Heart disease Mother   . Lung cancer Father   . Cancer Sister   . Diabetes Brother   . Colon cancer Neg Hx     Social History Social History   Tobacco Use  . Smoking status: Current Every Day Smoker    Packs/day: 1.00    Years: 57.00    Pack years: 57.00    Types: Cigarettes    Start date: 08/18/1959  . Smokeless tobacco: Never Used  Substance Use Topics  . Alcohol use: No    Alcohol/week: 0.0 oz  . Drug use: No     Allergies   Patient has no known allergies.   Review of Systems Review of  Systems  All other systems reviewed and are negative.    Physical Exam Updated Vital Signs BP 107/71 (BP Location: Left Arm)   Pulse 80   Temp 98 F (36.7 C) (Oral)   Resp 16   Ht 5\' 4"  (1.626 m)   Wt 40.8 kg (90 lb)   SpO2 100%   BMI 15.45 kg/m   Physical Exam  Constitutional: She is oriented to person, place, and time. She appears well-developed and well-nourished.  HENT:  Head: Normocephalic and atraumatic.  Eyes: Conjunctivae are normal.  Neck: Neck supple.  Cardiovascular: Normal rate and regular rhythm.  Pulmonary/Chest: Effort normal and breath  sounds normal.  No obvious tachypnea or wheezing  Abdominal: Soft. Bowel sounds are normal.  Musculoskeletal: Normal range of motion.  Neurological: She is alert and oriented to person, place, and time.  Skin: Skin is warm and dry.  Psychiatric: She has a normal mood and affect. Her behavior is normal.  Nursing note and vitals reviewed.    ED Treatments / Results  Labs (all labs ordered are listed, but only abnormal results are displayed) Labs Reviewed  CBC WITH DIFFERENTIAL/PLATELET - Abnormal; Notable for the following components:      Result Value   Hemoglobin 10.7 (*)    HCT 34.0 (*)    RDW 15.6 (*)    Eosinophils Absolute 0.8 (*)    All other components within normal limits  BASIC METABOLIC PANEL - Abnormal; Notable for the following components:   Glucose, Bld 118 (*)    All other components within normal limits    EKG EKG Interpretation  Date/Time:  Saturday May 30 2018 07:25:30 EDT Ventricular Rate:  81 PR Interval:    QRS Duration: 101 QT Interval:  394 QTC Calculation: 463 R Axis:   -101 Text Interpretation:  Sinus rhythm Left anterior fascicular block RSR' in V1 or V2, probably normal variant Minimal ST depression, inferior leads Confirmed by Nat Christen [phone]) on 05/30/2018 7:59:31 AM   Radiology Dg Chest Portable 1 View  Result Date: 05/30/2018 CLINICAL DATA:  Shortness of breath EXAM:  PORTABLE CHEST 1 VIEW COMPARISON:  04/13/2018 FINDINGS: Large lung volumes. There is emphysema by CT. Borderline heart size, stable. There is no edema, consolidation, effusion, or pneumothorax. Trachea is mildly deviated to the left at the thoracic inlet with no nodule or mass on prior CT. IMPRESSION: COPD without acute finding. Electronically Signed   By: Monte Fantasia M.D.   On: 05/30/2018 08:28    Procedures Procedures (including critical care time)  Medications Ordered in ED Medications  ipratropium-albuterol (DUONEB) 0.5-2.5 (3) MG/3ML nebulizer solution 3 mL (3 mLs Nebulization Given 05/30/18 0813)  methylPREDNISolone sodium succinate (SOLU-MEDROL) 125 mg/2 mL injection 125 mg (125 mg Intravenous Given 05/30/18 0754)     Initial Impression / Assessment and Plan / ED Course  I have reviewed the triage vital signs and the nursing notes.  Pertinent labs & imaging results that were available during my care of the patient were reviewed by me and considered in my medical decision making (see chart for details).     History and physical most consistent with COPD exacerbation.  EKG, chest x-ray, basic labs acceptable.  She feels much better after nebulizer treatment and IV steroids.  She will continue her home nebulizer.  Discharge medication prednisone.  Final Clinical Impressions(s) / ED Diagnoses   Final diagnoses:  COPD exacerbation [facility])    ED Discharge Orders        Ordered    predniSONE (DELTASONE) 10 MG tablet  Daily with breakfast     05/30/18 1119       Nat Christen, MD 05/30/18 1128

## 2018-05-30 NOTE — Discharge Instructions (Addendum)
Chest x-ray showed no pneumonia.  You can use your breathing treatments every 3-4 hours.  Start prescription for prednisone tomorrow.  Return if worse.

## 2018-05-30 NOTE — ED Triage Notes (Signed)
Pt reports sob x 2 days.  Denies fever, denies pain.  Reports copd.

## 2018-06-12 ENCOUNTER — Encounter (HOSPITAL_COMMUNITY): Payer: Self-pay | Admitting: *Deleted

## 2018-06-12 ENCOUNTER — Emergency Department (HOSPITAL_COMMUNITY): Payer: Medicare Other

## 2018-06-12 ENCOUNTER — Observation Stay (HOSPITAL_COMMUNITY)
Admission: EM | Admit: 2018-06-12 | Discharge: 2018-06-15 | Disposition: A | Discharge status: Home / Self Care | Payer: Medicare Other | Attending: Pulmonary Disease | Admitting: Pulmonary Disease

## 2018-06-12 DIAGNOSIS — E46 Unspecified protein-calorie malnutrition: Secondary | ICD-10-CM

## 2018-06-12 DIAGNOSIS — R0602 Shortness of breath: Secondary | ICD-10-CM

## 2018-06-12 DIAGNOSIS — I10 Essential (primary) hypertension: Secondary | ICD-10-CM

## 2018-06-12 DIAGNOSIS — R0902 Hypoxemia: Secondary | ICD-10-CM | POA: Diagnosis not present

## 2018-06-12 DIAGNOSIS — F17210 Nicotine dependence, cigarettes, uncomplicated: Secondary | ICD-10-CM | POA: Diagnosis not present

## 2018-06-12 DIAGNOSIS — J441 Chronic obstructive pulmonary disease with (acute) exacerbation: Secondary | ICD-10-CM | POA: Diagnosis not present

## 2018-06-12 DIAGNOSIS — J9601 Acute respiratory failure with hypoxia: Principal | ICD-10-CM | POA: Diagnosis present

## 2018-06-12 DIAGNOSIS — K219 Gastro-esophageal reflux disease without esophagitis: Secondary | ICD-10-CM | POA: Diagnosis not present

## 2018-06-12 LAB — BLOOD GAS, VENOUS
Acid-base deficit: 0.6 mmol/L (ref 0.0–2.0)
Bicarbonate: 23.2 mmol/L (ref 20.0–28.0)
DRAWN BY: 32132
FIO2: 28
O2 CONTENT: 2 L/min
O2 Saturation: 95.9 %
pCO2, Ven: 52.8 mmHg (ref 44.0–60.0)
pH, Ven: 7.298 (ref 7.250–7.430)
pO2, Ven: 91.6 mmHg — ABNORMAL HIGH (ref 32.0–45.0)

## 2018-06-12 LAB — BASIC METABOLIC PANEL
ANION GAP: 7 (ref 5–15)
BUN: 18 mg/dL (ref 6–20)
CO2: 29 mmol/L (ref 22–32)
Calcium: 9.2 mg/dL (ref 8.9–10.3)
Chloride: 107 mmol/L (ref 101–111)
Creatinine, Ser: 0.75 mg/dL (ref 0.44–1.00)
Glucose, Bld: 105 mg/dL — ABNORMAL HIGH (ref 65–99)
Potassium: 4.3 mmol/L (ref 3.5–5.1)
Sodium: 143 mmol/L (ref 135–145)

## 2018-06-12 LAB — CBC
HCT: 36 % (ref 36.0–46.0)
HEMOGLOBIN: 11.1 g/dL — AB (ref 12.0–15.0)
MCH: 25.5 pg — ABNORMAL LOW (ref 26.0–34.0)
MCHC: 30.8 g/dL (ref 30.0–36.0)
MCV: 82.8 fL (ref 78.0–100.0)
Platelets: 380 10*3/uL (ref 150–400)
RBC: 4.35 MIL/uL (ref 3.87–5.11)
RDW: 15.2 % (ref 11.5–15.5)
WBC: 10 10*3/uL (ref 4.0–10.5)

## 2018-06-12 LAB — TROPONIN I

## 2018-06-12 MED ORDER — ALBUTEROL SULFATE (2.5 MG/3ML) 0.083% IN NEBU
5.0000 mg | INHALATION_SOLUTION | Freq: Once | RESPIRATORY_TRACT | Status: DC
  Administered 2018-06-12: 5 mg via RESPIRATORY_TRACT

## 2018-06-12 MED ORDER — ALBUTEROL SULFATE (2.5 MG/3ML) 0.083% IN NEBU
2.5000 mg | INHALATION_SOLUTION | Freq: Once | RESPIRATORY_TRACT | Status: DC
  Filled 2018-06-12: qty 3

## 2018-06-12 MED ORDER — PREDNISONE 50 MG PO TABS
60.0000 mg | ORAL_TABLET | Freq: Once | ORAL | Status: AC
  Administered 2018-06-12: 60 mg via ORAL
  Filled 2018-06-12: qty 1

## 2018-06-12 MED ORDER — IPRATROPIUM-ALBUTEROL 0.5-2.5 (3) MG/3ML IN SOLN
3.0000 mL | Freq: Once | RESPIRATORY_TRACT | Status: AC
  Administered 2018-06-12: 3 mL via RESPIRATORY_TRACT
  Filled 2018-06-12: qty 3

## 2018-06-12 MED ORDER — IPRATROPIUM BROMIDE 0.02 % IN SOLN
1.0000 mg | Freq: Once | RESPIRATORY_TRACT | Status: AC
  Administered 2018-06-12: 1 mg via RESPIRATORY_TRACT

## 2018-06-12 MED ORDER — AZITHROMYCIN 250 MG PO TABS
500.0000 mg | ORAL_TABLET | Freq: Once | ORAL | Status: DC
  Administered 2018-06-13: 500 mg via ORAL

## 2018-06-12 MED ORDER — ALBUTEROL (5 MG/ML) CONTINUOUS INHALATION SOLN
10.0000 mg/h | INHALATION_SOLUTION | RESPIRATORY_TRACT | Status: DC
  Administered 2018-06-12: 10 mg/h via RESPIRATORY_TRACT
  Filled 2018-06-12: qty 20

## 2018-06-12 NOTE — ED Provider Notes (Signed)
Emergency Department Provider Note   I have reviewed the triage vital signs and the nursing notes.   HISTORY  Chief Complaint Shortness of Breath   HPI Katelyn [name] is a 77 y.o. female with multiple medical problems as documented below the presents to the emergency department today with cough and shortness of breath.  Patient states that this is been going on for a while but specifically last 2 days is deafly gotten progressively worsened to the point that today she tried taking albuterol all day and it seemed to help with her symptoms.  She does have liver productive cough but no fevers.  No rashes.  No chest pain no abdominal pain.  Has not been compliant with all of her medications secondary to financial issues. No other associated or modifying symptoms.    Past Medical History:  Diagnosis Date  . Asthma   . Chronic neck pain   . Chronic pain of left elbow   . Complication of anesthesia    hard to wake  . COPD (chronic obstructive pulmonary disease) ([place])   . Dysphagia    requiring multiple dilations in past  . Headache    Patient states she needs  eyeglasses  . Irregular heart beat   . S/P colonoscopy 2009   dr. Gala Romney: anal papilla, 2 cecal AVMs. Due in 2014  . S/P endoscopy 2012   Dr. Gala Romney: mild chronic gastritis, bulbar erosions, s/p 54-French Maloney dilation   . S/P endoscopy 2003, 2004, 2007   gastritis, duodenitis, s/p dilation, +H.pylori     Patient Active Problem List   Diagnosis Date Noted  . Tachycardia 04/14/2018  . Acute gastritis 03/31/2018  . Esophagitis 03/31/2018  . Duodenal ulcer 03/31/2018  . Goals of care, counseling/discussion   . DNR (do not resuscitate) discussion   . Palliative care by specialist   . COPD with acute exacerbation ([place]) 03/29/2018  . COPD (chronic obstructive pulmonary disease) ([place]) 02/03/2018  . Hypoxia 11/28/2015  . COPD exacerbation ([place]) 11/28/2015  . Acute respiratory failure with hypoxia ([place]) 11/28/2015  . SOB  (shortness of breath) 11/28/2015  . Mucosal abnormality of stomach   . History of colonic polyps 04/25/2015  . 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 Dysphagia 03/11/2011    Past Surgical History:  Procedure Laterality Date  . APPENDECTOMY    . BIOPSY  03/30/2018   Procedure: BIOPSY;  Surgeon: Daneil Dolin, MD;  [facility];  Service: Gastroenterology;;  gastric  . CHOLECYSTECTOMY    . COLONOSCOPY  10/10/2008   RMR: 1. Anal papilla, otherwise normal rectum. 2. Two cecal arteriovenous malformations, colonic mucosa apperaed normal.   . COLONOSCOPY N/A 05/10/2015   RMR: Rectal and colonic polyps removed as described above. Ascending colon AVMs . Rectal polyps were tubular adenomas. Next colonoscopy May 2021.  . ESOPHAGEAL DILATION N/A 05/10/2015   Procedure: ESOPHAGEAL DILATION;  Surgeon: Daneil Dolin, MD;  [facility];  Service: Endoscopy;  Laterality: N/A;  . ESOPHAGOGASTRODUODENOSCOPY  04/02/2011   RMR: 1. Endoscopically normal -appearing esophagus status post passage of a Maloney dilator followed by biopsy. 2. Hiatal hernia, Mottling, and submucosal petechiae in teh gastric mucoas of uncertain significance status post biopsy. 3. Bulbar erosions as described above.   . ESOPHAGOGASTRODUODENOSCOPY N/A 05/10/2015   RMR: Normal esophagus status post Maloney dilation. Small hiatal hernia. Abnormal gastric because of doubtful clinical significance status post biopsy (benign)  . ESOPHAGOGASTRODUODENOSCOPY (EGD) WITH PROPOFOL N/A 03/30/2018   Procedure: ESOPHAGOGASTRODUODENOSCOPY (EGD) WITH  PROPOFOL;  Surgeon: Daneil Dolin, MD;  [facility];  Service: Gastroenterology;  Laterality: N/A;  WITH DILATION   . INGUINAL HERNIA REPAIR    . MALONEY DILATION  03/30/2018   Procedure: Venia Minks DILATION;  Surgeon: Daneil Dolin, MD;  Location: [facility];  Service: Gastroenterology;;  . TONSILLECTOMY    . TOTAL ABDOMINAL HYSTERECTOMY      Current Outpatient Rx  . Order  #: [id number] Class: Print  . Order #: [id number] Class: Print  . Order #: [id number] Class: Normal  . Order #: [id number] Class: Historical Med  . Order #: [id number] Class: Historical Med  . Order #: [id number] Class: Normal  . Order #: [id number] Class: Print  . Order #: [id number] Class: Normal    Allergies Patient has no known allergies.  Family History  Problem Relation Age of Onset  . Heart disease Mother   . Lung cancer Father   . Cancer Sister   . Diabetes Brother   . Colon cancer Neg Hx     Social History Social History   Tobacco Use  . Smoking status: Current Every Day Smoker    Packs/day: 1.00    Years: 57.00    Pack years: 57.00    Types: Cigarettes    Start date: 08/18/1959  . Smokeless tobacco: Never Used  Substance Use Topics  . Alcohol use: No    Alcohol/week: 0.0 oz  . Drug use: No    Review of Systems  All other systems negative except as documented in the HPI. All pertinent positives and negatives as reviewed in the HPI. ____________________________________________   PHYSICAL EXAM:  VITAL SIGNS: ED Triage Vitals  Enc Vitals Group     BP 06/12/18 2048 (!) 125/101     Pulse Rate 06/12/18 2048 (!) 101     Resp 06/12/18 2048 18     Temp 06/12/18 2048 98.3 F (36.8 C)     Temp Source 06/12/18 2048 Oral     SpO2 06/12/18 2048 90 %     Weight 06/12/18 2049 88 lb (39.9 kg)     Height --      Head Circumference --      Peak Flow --      Pain Score 06/12/18 2049 0     Pain Loc --      Pain Edu? --      Excl. in [place]? --     Constitutional: Alert and oriented. Cachectic, frail, respiratory distress. Eyes: Conjunctivae are normal. PERRL. EOMI. Head: Atraumatic. Nose: No congestion/rhinnorhea. Mouth/Throat: Mucous membranes are moist.  Oropharynx non-erythematous. Neck: No stridor.  No meningeal signs.   Cardiovascular: tachycardic rate, regular rhythm. Good peripheral circulation. Grossly normal heart sounds.   Respiratory: tachypneic, resp distress,  retractiions. Diminished and wheezing.  Gastrointestinal: Soft and nontender. No distention.  Musculoskeletal: No lower extremity tenderness nor edema. No gross deformities of extremities. Neurologic:  Normal speech and language. No gross focal neurologic deficits are appreciated.  Skin:  Skin is warm, dry and intact. No rash noted.   ____________________________________________   LABS (all labs ordered are listed, but only abnormal results are displayed)  Labs Reviewed  BASIC METABOLIC PANEL - Abnormal; Notable for the following components:      Result Value   Glucose, Bld 105 (*)    All other components within normal limits  CBC - Abnormal; Notable for the following components:   Hemoglobin 11.1 (*)    MCH 25.5 (*)    All other components within normal limits  BLOOD GAS,  VENOUS - Abnormal; Notable for the following components:   pO2, Ven 91.6 (*)    All other components within normal limits  TROPONIN I   ____________________________________________  EKG   EKG Interpretation  Date/Time:  Friday June 12 2018 20:53:02 EDT Ventricular Rate:  101 PR Interval:  188 QRS Duration: 76 QT Interval:  344 QTC Calculation: 446 R Axis:   65 Text Interpretation:  Sinus tachycardia Otherwise normal ECG tachycardia new since juney 2019 Confirmed by Merrily Pew [phone]) on 06/12/2018 9:58:12 PM       ____________________________________________  RADIOLOGY  No results found.  ____________________________________________   PROCEDURES  Procedure(s) performed:   Procedures  CRITICAL CARE Performed by: Merrily Pew Total critical care time: 33 minutes Critical care time was exclusive of separately billable procedures and treating other patients. Critical care was necessary to treat or prevent imminent or life-threatening deterioration. Critical care was time spent personally by me on the following activities: development of treatment plan with patient and/or surrogate as  well as nursing, discussions with consultants, evaluation of patient's response to treatment, examination of patient, obtaining history from patient or surrogate, ordering and performing treatments and interventions, ordering and review of laboratory studies, ordering and review of radiographic studies, pulse oximetry and re-evaluation of patient's condition.  ____________________________________________   INITIAL IMPRESSION / ASSESSMENT AND PLAN / ED COURSE  COPD exacerbation.  Initially consider possible BiPAP however after speak with respiratory therapist to the patient well she stated that this was not as severe as it looked that she has a baseline of diminished breath sounds and mild respiratory distress.  Patient given continuous albuterol, ipratropium, antibiotics, steroids with improvement in her symptoms however still requiring oxygen to maintain saturations above 90%.  She is also still tachypnea.  Still in neurosurgery distress.  Will admit to hospital for further albuterol and management     Pertinent labs & imaging results that were available during my care of the patient were reviewed by me and considered in my medical decision making (see chart for details).  ____________________________________________  FINAL CLINICAL IMPRESSION(S) / ED DIAGNOSES  Final diagnoses:  Hypoxia  Acute respiratory failure with hypoxia (HCC)  COPD exacerbation (HCC)  SOB (shortness of breath)     MEDICATIONS GIVEN DURING THIS VISIT:  Medications  albuterol (PROVENTIL) (2.5 MG/3ML) 0.083% nebulizer solution 2.5 mg ( Nebulization Canceled Entry 06/12/18 2159)  albuterol (PROVENTIL,VENTOLIN) solution continuous neb (10 mg/hr Nebulization New Bag/Given 06/12/18 2202)  azithromycin (ZITHROMAX) tablet 500 mg (has no administration in time range)  ipratropium-albuterol (DUONEB) 0.5-2.5 (3) MG/3ML nebulizer solution 3 mL (3 mLs Nebulization Given 06/12/18 2159)  ipratropium (ATROVENT) nebulizer  solution 1 mg (1 mg Nebulization Given 06/12/18 2202)  predniSONE (DELTASONE) tablet 60 mg (60 mg Oral Given 06/12/18 2210)     NEW OUTPATIENT MEDICATIONS STARTED DURING THIS VISIT:  New Prescriptions   No medications on file    Note:  This note was prepared with assistance of Dragon voice recognition software. Occasional wrong-word or sound-a-like substitutions may have occurred due to the inherent limitations of voice recognition software.   Merrily Pew, MD 06/12/18 [phone]

## 2018-06-12 NOTE — ED Triage Notes (Signed)
Pt arrives with sob.  Pt is wheezing and tells me that she has been out of her medication due to financial constraints.

## 2018-06-13 ENCOUNTER — Other Ambulatory Visit: Payer: Self-pay

## 2018-06-13 DIAGNOSIS — E46 Unspecified protein-calorie malnutrition: Secondary | ICD-10-CM

## 2018-06-13 DIAGNOSIS — K219 Gastro-esophageal reflux disease without esophagitis: Secondary | ICD-10-CM

## 2018-06-13 DIAGNOSIS — J9601 Acute respiratory failure with hypoxia: Secondary | ICD-10-CM

## 2018-06-13 DIAGNOSIS — I10 Essential (primary) hypertension: Secondary | ICD-10-CM

## 2018-06-13 LAB — CBC
HEMATOCRIT: 32.1 % — AB (ref 36.0–46.0)
HEMOGLOBIN: 10 g/dL — AB (ref 12.0–15.0)
MCH: 25.6 pg — AB (ref 26.0–34.0)
MCHC: 31.2 g/dL (ref 30.0–36.0)
MCV: 82.1 fL (ref 78.0–100.0)
Platelets: 341 10*3/uL (ref 150–400)
RBC: 3.91 MIL/uL (ref 3.87–5.11)
RDW: 15.2 % (ref 11.5–15.5)
WBC: 10.3 10*3/uL (ref 4.0–10.5)

## 2018-06-13 LAB — BASIC METABOLIC PANEL
ANION GAP: 11 (ref 5–15)
BUN: 17 mg/dL (ref 6–20)
CHLORIDE: 104 mmol/L (ref 101–111)
CO2: 23 mmol/L (ref 22–32)
Calcium: 8.9 mg/dL (ref 8.9–10.3)
Creatinine, Ser: 0.74 mg/dL (ref 0.44–1.00)
GFR calc non Af Amer: >60 mL/min (ref >60)
GLUCOSE: 148 mg/dL — AB (ref 65–99)
POTASSIUM: 4.2 mmol/L (ref 3.5–5.1)
Sodium: 138 mmol/L (ref 135–145)

## 2018-06-13 MED ORDER — SODIUM CHLORIDE 0.9% FLUSH: 3.0000 mL | INTRAVENOUS | Status: DC | PRN

## 2018-06-13 MED ORDER — HYDRALAZINE HCL 20 MG/ML IJ SOLN: 10.0000 mg | INTRAMUSCULAR | Status: DC | PRN

## 2018-06-13 MED ORDER — ONDANSETRON HCL 4 MG/2ML IJ SOLN: 4.0000 mg | Freq: Four times a day (QID) | INTRAMUSCULAR | Status: DC | PRN

## 2018-06-13 MED ORDER — UMECLIDINIUM-VILANTEROL 62.5-25 MCG/INH IN AEPB
1.0000 | INHALATION_SPRAY | Freq: Every day | RESPIRATORY_TRACT | Status: DC
  Administered 2018-06-14 – 2018-06-15 (×2): 1 via RESPIRATORY_TRACT
  Filled 2018-06-13: qty 14

## 2018-06-13 MED ORDER — ENOXAPARIN SODIUM 40 MG/0.4ML ~~LOC~~ SOLN
40.0000 mg | SUBCUTANEOUS | Status: DC
  Administered 2018-06-13 – 2018-06-14 (×2): 40 mg via SUBCUTANEOUS
  Filled 2018-06-13 (×2): qty 0.4

## 2018-06-13 MED ORDER — ADULT MULTIVITAMIN W/MINERALS CH
1.0000 | ORAL_TABLET | Freq: Every day | ORAL | Status: DC
  Administered 2018-06-13 – 2018-06-15 (×3): 1 via ORAL
  Filled 2018-06-13 (×3): qty 1

## 2018-06-13 MED ORDER — CENTRUM PO CHEW
1.0000 | CHEWABLE_TABLET | Freq: Every day | ORAL | Status: DC
  Filled 2018-06-13 (×3): qty 1

## 2018-06-13 MED ORDER — SODIUM CHLORIDE 0.9% FLUSH
3.0000 mL | Freq: Two times a day (BID) | INTRAVENOUS | Status: DC
  Administered 2018-06-13 – 2018-06-15 (×4): 3 mL via INTRAVENOUS

## 2018-06-13 MED ORDER — PANTOPRAZOLE SODIUM 40 MG PO TBEC
40.0000 mg | DELAYED_RELEASE_TABLET | Freq: Every day | ORAL | Status: DC
  Administered 2018-06-13 – 2018-06-15 (×3): 40 mg via ORAL
  Filled 2018-06-13 (×3): qty 1

## 2018-06-13 MED ORDER — LEVALBUTEROL HCL 0.63 MG/3ML IN NEBU
0.6300 mg | INHALATION_SOLUTION | Freq: Four times a day (QID) | RESPIRATORY_TRACT | Status: DC
  Administered 2018-06-13 – 2018-06-15 (×10): 0.63 mg via RESPIRATORY_TRACT
  Filled 2018-06-13 (×10): qty 3

## 2018-06-13 MED ORDER — ACETAMINOPHEN 650 MG RE SUPP: 650.0000 mg | Freq: Four times a day (QID) | RECTAL | Status: DC | PRN

## 2018-06-13 MED ORDER — ONDANSETRON HCL 4 MG PO TABS: 4.0000 mg | ORAL_TABLET | Freq: Four times a day (QID) | ORAL | Status: DC | PRN

## 2018-06-13 MED ORDER — METHYLPREDNISOLONE SODIUM SUCC 40 MG IJ SOLR
40.0000 mg | Freq: Two times a day (BID) | INTRAMUSCULAR | Status: DC
  Administered 2018-06-13 – 2018-06-14 (×4): 40 mg via INTRAVENOUS
  Filled 2018-06-13 (×5): qty 1

## 2018-06-13 MED ORDER — AZITHROMYCIN 250 MG PO TABS
500.0000 mg | ORAL_TABLET | Freq: Every day | ORAL | Status: DC
  Administered 2018-06-14 – 2018-06-15 (×2): 500 mg via ORAL
  Filled 2018-06-13 (×4): qty 2

## 2018-06-13 MED ORDER — IPRATROPIUM BROMIDE 0.02 % IN SOLN
0.5000 mg | Freq: Four times a day (QID) | RESPIRATORY_TRACT | Status: DC
  Administered 2018-06-13 – 2018-06-15 (×10): 0.5 mg via RESPIRATORY_TRACT
  Filled 2018-06-13 (×10): qty 2.5

## 2018-06-13 MED ORDER — BUDESONIDE 0.25 MG/2ML IN SUSP
0.2500 mg | Freq: Two times a day (BID) | RESPIRATORY_TRACT | Status: DC
  Administered 2018-06-13 – 2018-06-15 (×6): 0.25 mg via RESPIRATORY_TRACT
  Filled 2018-06-13 (×11): qty 2

## 2018-06-13 MED ORDER — SODIUM CHLORIDE 0.9 % IV SOLN: 250.0000 mL | INTRAVENOUS | Status: DC | PRN

## 2018-06-13 MED ORDER — ACETAMINOPHEN 325 MG PO TABS
650.0000 mg | ORAL_TABLET | Freq: Four times a day (QID) | ORAL | Status: DC | PRN
  Administered 2018-06-14: 650 mg via ORAL
  Filled 2018-06-13: qty 2

## 2018-06-13 NOTE — Progress Notes (Signed)
Subjective: She has come back in for COPD exacerbation.  She was admitted early this morning.  This is an assumption of care note  Objective: Vital signs in last 24 hours: Temp:  [97.7 F (36.5 C)-98.4 F (36.9 C)] 98.4 F (36.9 C) (06/22 0500) Pulse Rate:  [73-120] 90 (06/22 0500) Resp:  [17-26] 18 (06/22 0500) BP: (88-164)/(44-128) 132/70 (06/22 0500) SpO2:  [89 %-100 %] 93 % (06/22 0807) Weight:  [39.9 kg (88 lb)] 39.9 kg (88 lb) (06/22 0320) Weight change:     Intake/Output from previous day: 06/21 0701 - 06/22 0700 In: 240 [P.O.:240] Out: -   PHYSICAL EXAM General appearance: alert, cooperative, mild distress and Very thin Resp: rhonchi bilaterally Cardio: regular rate and rhythm, S1, S2 normal, no murmur, click, rub or gallop GI: soft, non-tender; bowel sounds normal; no masses,  no organomegaly Extremities: extremities normal, atraumatic, no cyanosis or edema  Lab Results:  Results for orders placed or performed during the hospital encounter of 06/12/18 (from the past 48 hour(s))  Basic metabolic panel     Status: Abnormal   Collection Time: 06/12/18  9:36 PM  Result Value Ref Range   Sodium 143 135 - 145 mmol/L   Potassium 4.3 3.5 - 5.1 mmol/L   Chloride 107 101 - 111 mmol/L   CO2 29 22 - 32 mmol/L   Glucose, Bld 105 (H) 65 - 99 mg/dL   BUN 18 6 - 20 mg/dL   Creatinine, Ser 0.75 0.44 - 1.00 mg/dL   Calcium 9.2 8.9 - 10.3 mg/dL   GFR calc non Af Amer >60 >60 mL/min   GFR calc Af Amer >60 >60 mL/min    Comment: (NOTE) The eGFR has been calculated using the CKD EPI equation. This calculation has not been validated in all clinical situations. eGFR's persistently <60 mL/min signify possible Chronic Kidney Disease.    Anion gap 7 5 - 15    Comment: Performed at [facility], [address], [place], [place] [address]  CBC     Status: Abnormal   Collection Time: 06/12/18  9:36 PM  Result Value Ref Range   WBC 10.0 4.0 - 10.5 K/uL   RBC 4.35 3.87 - 5.11 MIL/uL    Hemoglobin 11.1 (L) 12.0 - 15.0 g/dL   HCT 36.0 36.0 - 46.0 %   MCV 82.8 78.0 - 100.0 fL   MCH 25.5 (L) 26.0 - 34.0 pg   MCHC 30.8 30.0 - 36.0 g/dL   RDW 15.2 11.5 - 15.5 %   Platelets 380 150 - 400 K/uL    Comment: Performed at [facility], [address], [place], [place] [address]  Troponin I     Status: None   Collection Time: 06/12/18  9:36 PM  Result Value Ref Range   Troponin I <0.03 <0.03 ng/mL    Comment: Performed at [facility], [address], [place], [place] [address]  Blood gas, venous     Status: Abnormal   Collection Time: 06/12/18 10:20 PM  Result Value Ref Range   FIO2 28.00    O2 Content 2.0 L/min   Delivery systems NASAL CANNULA    pH, Ven 7.298 7.250 - 7.430   pCO2, Ven 52.8 44.0 - 60.0 mmHg   pO2, Ven 91.6 (H) 32.0 - 45.0 mmHg   Bicarbonate 23.2 20.0 - 28.0 mmol/L   Acid-base deficit 0.6 0.0 - 2.0 mmol/L   O2 Saturation 95.9 %   Drawn by [id number]    Sample type VENOUS  Comment: Performed at [facility], [address], [place], [place] [address]  Basic metabolic panel     Status: Abnormal   Collection Time: 06/13/18  6:34 AM  Result Value Ref Range   Sodium 138 135 - 145 mmol/L   Potassium 4.2 3.5 - 5.1 mmol/L   Chloride 104 101 - 111 mmol/L   CO2 23 22 - 32 mmol/L   Glucose, Bld 148 (H) 65 - 99 mg/dL   BUN 17 6 - 20 mg/dL   Creatinine, Ser 0.74 0.44 - 1.00 mg/dL   Calcium 8.9 8.9 - 10.3 mg/dL   GFR calc non Af Amer >60 >60 mL/min   GFR calc Af Amer >60 >60 mL/min    Comment: (NOTE) The eGFR has been calculated using the CKD EPI equation. This calculation has not been validated in all clinical situations. eGFR's persistently <60 mL/min signify possible Chronic Kidney Disease.    Anion gap 11 5 - 15    Comment: Performed at [facility], [address], [place], [place] [address]  CBC     Status: Abnormal   Collection Time: 06/13/18  6:34 AM  Result Value Ref Range   WBC 10.3 4.0 - 10.5 K/uL   RBC 3.91 3.87 - 5.11 MIL/uL   Hemoglobin 10.0  (L) 12.0 - 15.0 g/dL   HCT 32.1 (L) 36.0 - 46.0 %   MCV 82.1 78.0 - 100.0 fL   MCH 25.6 (L) 26.0 - 34.0 pg   MCHC 31.2 30.0 - 36.0 g/dL   RDW 15.2 11.5 - 15.5 %   Platelets 341 150 - 400 K/uL    Comment: Performed at [facility], [address], [place], [place] [address]    ABGS Recent Labs    06/12/18 2220  HCO3 23.2   CULTURES No results found for this or any previous visit (from the past 240 hour(s)). Studies/Results: Dg Chest Portable 1 View  Result Date: 06/13/2018 CLINICAL DATA:  Shortness of breath EXAM: PORTABLE CHEST 1 VIEW COMPARISON:  05/30/2018, 04/13/2018, CT chest 04/15/2018 FINDINGS: Hyperinflation with emphysematous disease. No acute airspace disease or pleural effusion. Normal heart size. No pneumothorax. IMPRESSION: No active disease.  Hyperinflation with emphysematous disease. Electronically Signed   By: Donavan Foil M.D.   On: 06/13/2018 00:38    Medications:  Prior to Admission:  Medications Prior to Admission  Medication Sig Dispense Refill Last Dose  . albuterol (PROVENTIL HFA;VENTOLIN HFA) 108 (90 BASE) MCG/ACT inhaler Inhale 2 puffs into the lungs every 6 (six) hours as needed for wheezing or shortness of breath. 1 Inhaler 1 06/13/2018 at Unknown time  . albuterol (PROVENTIL) (2.5 MG/3ML) 0.083% nebulizer solution Take 3 mLs (2.5 mg total) by nebulization every 4 (four) hours as needed for wheezing or shortness of breath. (Patient not taking: Reported on 04/20/2018) 45 mL 1 Not Taking  . azithromycin (ZITHROMAX Z-PAK) 250 MG tablet Take by package instructions 6 each 0 Taking  . Dextromethorphan-guaiFENesin (GUAIFENESIN DM PO) Take 1 tablet by mouth daily as needed. For sinuses and may take twice daily if needed.   Taking  . Multiple Vitamins-Minerals (MULTIVITAMIN ADULT) CHEW Chew 2 each by mouth daily. Vitafusion gummy   Taking  . pantoprazole (PROTONIX) 40 MG tablet Take 1 tablet (40 mg total) by mouth daily. 30 tablet 12 Taking  . predniSONE (DELTASONE)  10 MG tablet Take 1 tablet (10 mg total) by mouth daily with breakfast. 3 tablets for 3 days, 2 tablets for 3 days, 1 tablet for 3 days.  18 tablet 0   . umeclidinium-vilanterol (ANORO ELLIPTA) 62.5-25 MCG/INH AEPB Inhale 1 puff into the lungs daily. 1 each 12 Taking   Scheduled: . azithromycin  500 mg Oral Daily  . budesonide (PULMICORT) nebulizer solution  0.25 mg Nebulization BID  . enoxaparin (LOVENOX) injection  40 mg Subcutaneous Q24H  . ipratropium  0.5 mg Nebulization Q6H  . levalbuterol  0.63 mg Nebulization Q6H  . methylPREDNISolone (SOLU-MEDROL) injection  40 mg Intravenous Q12H  . multivitamin-iron-minerals-folic acid  1 tablet Oral Daily  . pantoprazole  40 mg Oral Daily  . sodium chloride flush  3 mL Intravenous Q12H  . umeclidinium-vilanterol  1 puff Inhalation Daily   Continuous: . sodium chloride     EFC:SRRMUU chloride, acetaminophen **OR** acetaminophen, hydrALAZINE, ondansetron **OR** ondansetron (ZOFRAN) IV, sodium chloride flush  Assesment: She was admitted with acute hypoxic respiratory failure from COPD exacerbation.  She has been having trouble affording her medications.  She has malnutrition and the last time I saw her in the office she had been able to gain 2 pounds.  I think some of this is related to depression but she refuses to take antidepressants  She is been having hypertension this admission.  She has significant financial issues to keep her from getting her medication Principal Problem:   Acute respiratory failure with hypoxia (HCC) Active Problems:   COPD exacerbation (HCC)   GERD (gastroesophageal reflux disease)   Malnutrition ([place])   Essential hypertension    Plan: Continue treatments    LOS: 0 days   Latrish Mogel L 06/13/2018, 10:16 AM

## 2018-06-13 NOTE — H&P (Signed)
History and Physical    [name] VZD:[id number] DOB: [birth year] DOA: 06/12/2018  PCP: Kari Baars, MD   Patient coming from: Home  Chief Complaint: Dyspnea  HPI: Katelyn [name] is a 77 y.o. 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  She has been trying to take her home inhaler, but has recently run out and began to experience worsening chest tightness.  She states that she could not afford her inhaler due to some financial constraints.  She states her cough is mildly productive and that she has no fever or chills. She denies any chest pain, lower extremity edema, abdominal pain, nausea, or vomiting.   ED Course: Vital signs in ED demonstrate some tachycardia as well as elevated blood pressure readings.  Is been started on 2.5 L nasal cannula due to some mild hypoxemia in the high 80th percentile.  Chest x-ray with no active disease aside from emphysematous changes.  EKG was sinus rhythm at 92 bpm.  ABG was evaluated and was within normal limits for a venous stick.  Review of Systems: All others reviewed and otherwise negative.  Past Medical History:  Diagnosis Date  . Asthma   . Chronic neck pain   . Chronic pain of left elbow   . Complication of anesthesia    hard to wake  . COPD (chronic obstructive pulmonary disease) (HCC)   . Dysphagia    requiring multiple dilations in past  . Headache    Patient states she needs  eyeglasses  . Irregular heart beat   . S/P colonoscopy 2009   dr. Jena Gauss: anal papilla, 2 cecal AVMs. Due in 2014  . S/P endoscopy 2012   Dr. Jena Gauss: mild chronic gastritis, bulbar erosions, s/p 54-French Maloney dilation   . S/P endoscopy 2003, 2004, 2007   gastritis, duodenitis, s/p dilation, +H.pylori     Past  Surgical History:  Procedure Laterality Date  . APPENDECTOMY    . BIOPSY  03/30/2018   Procedure: BIOPSY;  Surgeon: Corbin Ade, MD;  [facility];  Service: Gastroenterology;;  gastric  . CHOLECYSTECTOMY    . COLONOSCOPY  10/10/2008   RMR: 1. Anal papilla, otherwise normal rectum. 2. Two cecal arteriovenous malformations, colonic mucosa apperaed normal.   . COLONOSCOPY N/A 05/10/2015   RMR: Rectal and colonic polyps removed as described above. Ascending colon AVMs . Rectal polyps were tubular adenomas. Next colonoscopy May 2021.  . ESOPHAGEAL DILATION N/A 05/10/2015   Procedure: ESOPHAGEAL DILATION;  Surgeon: Corbin Ade, MD;  [facility]: [facility];  Service: Endoscopy;  Laterality: N/A;  . ESOPHAGOGASTRODUODENOSCOPY  04/02/2011   RMR: 1. Endoscopically normal -appearing esophagus status post passage of a Maloney dilator followed by biopsy. 2. Hiatal hernia, Mottling, and submucosal petechiae in teh gastric mucoas of uncertain significance status post biopsy. 3. Bulbar erosions as described above.   . ESOPHAGOGASTRODUODENOSCOPY N/A 05/10/2015   RMR: Normal esophagus status post Maloney dilation. Small hiatal hernia. Abnormal gastric because of doubtful clinical significance status post biopsy (benign)  . ESOPHAGOGASTRODUODENOSCOPY (EGD) WITH PROPOFOL N/A 03/30/2018   Procedure: ESOPHAGOGASTRODUODENOSCOPY (EGD) WITH PROPOFOL;  Surgeon: Corbin Ade, MD;  [facility];  Service: Gastroenterology;  Laterality: N/A;  WITH DILATION   . INGUINAL HERNIA REPAIR    .  MALONEY DILATION  03/30/2018   Procedure: MALONEY DILATION;  Surgeon: Corbin Ade, MD;  [facility];  Service: Gastroenterology;;  . TONSILLECTOMY    . TOTAL ABDOMINAL HYSTERECTOMY       reports that she has been smoking cigarettes.  She started smoking about 58 years ago. She has a 57.00 pack-year smoking history. She has never used smokeless tobacco. She reports that she does not drink alcohol  or use drugs.  No Known Allergies  Family History  Problem Relation Age of Onset  . Heart disease Mother   . Lung cancer Father   . Cancer Sister   . Diabetes Brother   . Colon cancer Neg Hx     Prior to Admission medications   Medication Sig Start Date End Date Taking? Authorizing Provider  albuterol (PROVENTIL HFA;VENTOLIN HFA) 108 (90 BASE) MCG/ACT inhaler Inhale 2 puffs into the lungs every 6 (six) hours as needed for wheezing or shortness of breath. 09/04/15   Azalia Bilis, MD  albuterol (PROVENTIL) (2.5 MG/3ML) 0.083% nebulizer solution Take 3 mLs (2.5 mg total) by nebulization every 4 (four) hours as needed for wheezing or shortness of breath. Patient not taking: Reported on 04/20/2018 09/04/15   Azalia Bilis, MD  azithromycin Christena Deem Z-PAK) 250 MG tablet Take by package instructions 04/16/18   Kari Baars, MD  Dextromethorphan-guaiFENesin (GUAIFENESIN DM PO) Take 1 tablet by mouth daily as needed. For sinuses and may take twice daily if needed.    [provider]  Multiple Vitamins-Minerals (MULTIVITAMIN ADULT) CHEW Chew 2 each by mouth daily. Vitafusion gummy    [provider]  pantoprazole (PROTONIX) 40 MG tablet Take 1 tablet (40 mg total) by mouth daily. 03/31/18   Kari Baars, MD  predniSONE (DELTASONE) 10 MG tablet Take 1 tablet (10 mg total) by mouth daily with breakfast. 3 tablets for 3 days, 2 tablets for 3 days, 1 tablet for 3 days. 05/30/18   Donnetta Hutching, MD  umeclidinium-vilanterol [facility] ELLIPTA) 62.5-25 MCG/INH AEPB Inhale 1 puff into the lungs daily. 04/17/18   Kari Baars, MD    Physical Exam: Vitals:   06/12/18 2259 06/12/18 2300 06/13/18 0000 06/13/18 0030  BP: (!) 164/79 138/69 106/65 (!) 96/55  Pulse: (!) 118 (!) 118 88 73  Resp: (!) 22 (!) 24    Temp: 97.7 F (36.5 C)     TempSrc: Oral     SpO2: 95% 95% 100% 100%  Weight:        Constitutional: NAD, calm, comfortable, thin Vitals:   06/12/18 2259 06/12/18 2300 06/13/18  0000 06/13/18 0030  BP: (!) 164/79 138/69 106/65 (!) 96/55  Pulse: (!) 118 (!) 118 88 73  Resp: (!) 22 (!) 24    Temp: 97.7 F (36.5 C)     TempSrc: Oral     SpO2: 95% 95% 100% 100%  Weight:       Eyes: lids and conjunctivae normal ENMT: Mucous membranes are moist.  Neck: normal, supple Respiratory: clear to auscultation bilaterally. Normal respiratory effort. No accessory muscle use.  Wheezing noted.  On 2.5 L nasal cannula. Cardiovascular: Regular rate and rhythm, no murmurs. No extremity edema. Abdomen: no tenderness, no distention. Bowel sounds positive.  Musculoskeletal:  No joint deformity upper and lower extremities.   Skin: no rashes, lesions, ulcers.  Psychiatric: Normal judgment and insight. Alert and oriented x 3. Normal mood.   Labs on Admission: I have personally reviewed following labs and imaging studies  CBC: Recent Labs  Lab  06/12/18 2136  WBC 10.0  HGB 11.1*  HCT 36.0  MCV 82.8  PLT 380   Basic Metabolic Panel: Recent Labs  Lab 06/12/18 2136  NA 143  K 4.3  CL 107  CO2 29  GLUCOSE 105*  BUN 18  CREATININE 0.75  CALCIUM 9.2   GFR: Estimated Creatinine Clearance: 38.9 mL/min (by C-G formula based on SCr of 0.75 mg/dL). Liver Function Tests: No results for input(s): AST, ALT, ALKPHOS, BILITOT, PROT, ALBUMIN in the last 168 hours. No results for input(s): LIPASE, AMYLASE in the last 168 hours. No results for input(s): AMMONIA in the last 168 hours. Coagulation Profile: No results for input(s): INR, PROTIME in the last 168 hours. Cardiac Enzymes: Recent Labs  Lab 06/12/18 2136  TROPONINI <0.03   BNP (last 3 results) No results for input(s): PROBNP in the last 8760 hours. HbA1C: No results for input(s): HGBA1C in the last 72 hours. CBG: No results for input(s): GLUCAP in the last 168 hours. Lipid Profile: No results for input(s): CHOL, HDL, LDLCALC, TRIG, CHOLHDL, LDLDIRECT in the last 72 hours. Thyroid Function Tests: No results for  input(s): TSH, T4TOTAL, FREET4, T3FREE, THYROIDAB in the last 72 hours. Anemia Panel: No results for input(s): VITAMINB12, FOLATE, FERRITIN, TIBC, IRON, RETICCTPCT in the last 72 hours. Urine analysis: No results found for: COLORURINE, APPEARANCEUR, LABSPEC, PHURINE, GLUCOSEU, HGBUR, BILIRUBINUR, KETONESUR, PROTEINUR, UROBILINOGEN, NITRITE, LEUKOCYTESUR  Radiological Exams on Admission: Dg Chest Portable 1 View  Result Date: 06/13/2018 CLINICAL DATA:  Shortness of breath EXAM: PORTABLE CHEST 1 VIEW COMPARISON:  05/30/2018, 04/13/2018, CT chest 04/15/2018 FINDINGS: Hyperinflation with emphysematous disease. No acute airspace disease or pleural effusion. Normal heart size. No pneumothorax. IMPRESSION: No active disease.  Hyperinflation with emphysematous disease. Electronically Signed   By: Jasmine Pang M.D.   On: 06/13/2018 00:38    EKG: Independently reviewed.  Sinus rhythm at 92 bpm.  Assessment/Plan Principal Problem:   Acute respiratory failure with hypoxia (HCC) Active Problems:   COPD exacerbation (HCC)   GERD (gastroesophageal reflux disease)   Malnutrition (HCC)   Essential hypertension    1. Acute hypoxemic respiratory failure secondary to COPD exacerbation.  It appears that some of this may be related to the fact that patient ran out of her inhalers and could not afford her medications.  Will start on Xopenex/ipratropium nebulizer treatments on account of tachycardia and schedule every 6 hours.  Solu-Medrol 40 mg twice daily IV as well as azithromycin.  Continue on Anoro. 2. Hypertension.  She appears to have some mild elevated blood pressure readings for which she is not currently on antihypertensives.  Will place on hydralazine as needed. 3. GERD.  PPI.   DVT prophylaxis: Lovenox Code Status: Full Family Communication: None at bedside Disposition Plan:AECOPD treatment Consults called:None Admission status: Obs, tele   Jasiya Markie Hoover Brunette DO [facility] Pager  [phone]  If 7PM-7AM, please contact night-coverage [email or web address] Password [email or web address]  06/13/2018, 1:28 AM

## 2018-06-14 DIAGNOSIS — J9601 Acute respiratory failure with hypoxia: Secondary | ICD-10-CM | POA: Diagnosis not present

## 2018-06-14 DIAGNOSIS — J441 Chronic obstructive pulmonary disease with (acute) exacerbation: Secondary | ICD-10-CM | POA: Diagnosis not present

## 2018-06-14 MED ORDER — ENOXAPARIN SODIUM 30 MG/0.3ML ~~LOC~~ SOLN
30.0000 mg | SUBCUTANEOUS | Status: DC
  Administered 2018-06-15: 30 mg via SUBCUTANEOUS
  Filled 2018-06-14: qty 0.3

## 2018-06-14 NOTE — Progress Notes (Signed)
Enoxaparin Dosing Adjustment for Weght <45kg  BMI: 15.74m2 Wt: 39.9kg CrClest~ 39    ML/min  Original Lovenox dose: Lovenox 40mg  daily New Lovenox dose: Lovennox 30mg  daily  Despina Pole, [facility] 06/14/18 1140

## 2018-06-14 NOTE — Progress Notes (Signed)
Subjective: She says she feels okay.  She has been able to walk around in the halls some.  She is still short of breath.  She is coughing some.  Objective: Vital signs in last 24 hours: Temp:  [98.5 F (36.9 C)-98.6 F (37 C)] 98.6 F (37 C) (06/23 0654) Pulse Rate:  [65-112] 65 (06/23 0654) Resp:  [18-21] 18 (06/23 0654) BP: (100-124)/(57-58) 124/58 (06/23 0654) SpO2:  [94 %-100 %] 94 % (06/23 0746) Weight change:  Last BM Date: 06/13/18  Intake/Output from previous day: 06/22 0701 - 06/23 0700 In: 1200 [P.O.:1200] Out: 400 [Urine:400]  PHYSICAL EXAM General appearance: alert, cooperative and no distress Resp: Her chest is clear with markedly prolonged expiratory phase Cardio: regular rate and rhythm, S1, S2 normal, no murmur, click, rub or gallop GI: soft, non-tender; bowel sounds normal; no masses,  no organomegaly Extremities: extremities normal, atraumatic, no cyanosis or edema  Lab Results:  Results for orders placed or performed during the hospital encounter of 06/12/18 (from the past 48 hour(s))  Basic metabolic panel     Status: Abnormal   Collection Time: 06/12/18  9:36 PM  Result Value Ref Range   Sodium 143 135 - 145 mmol/L   Potassium 4.3 3.5 - 5.1 mmol/L   Chloride 107 101 - 111 mmol/L   CO2 29 22 - 32 mmol/L   Glucose, Bld 105 (H) 65 - 99 mg/dL   BUN 18 6 - 20 mg/dL   Creatinine, Ser 0.75 0.44 - 1.00 mg/dL   Calcium 9.2 8.9 - 10.3 mg/dL   GFR calc non Af Amer >60 >60 mL/min   GFR calc Af Amer >60 >60 mL/min    Comment: (NOTE) The eGFR has been calculated using the CKD EPI equation. This calculation has not been validated in all clinical situations. eGFR's persistently <60 mL/min signify possible Chronic Kidney Disease.    Anion gap 7 5 - 15    Comment: Performed at [facility], [address], [place], [place] [address]  CBC     Status: Abnormal   Collection Time: 06/12/18  9:36 PM  Result Value Ref Range   WBC 10.0 4.0 - 10.5 K/uL   RBC 4.35  3.87 - 5.11 MIL/uL   Hemoglobin 11.1 (L) 12.0 - 15.0 g/dL   HCT 36.0 36.0 - 46.0 %   MCV 82.8 78.0 - 100.0 fL   MCH 25.5 (L) 26.0 - 34.0 pg   MCHC 30.8 30.0 - 36.0 g/dL   RDW 15.2 11.5 - 15.5 %   Platelets 380 150 - 400 K/uL    Comment: Performed at [facility], [address], [place], [place] [address]  Troponin I     Status: None   Collection Time: 06/12/18  9:36 PM  Result Value Ref Range   Troponin I <0.03 <0.03 ng/mL    Comment: Performed at [facility], [address] [place],  [address]  Blood gas, venous     Status: Abnormal   Collection Time: 06/12/18 10:20 PM  Result Value Ref Range   FIO2 28.00    O2 Content 2.0 L/min   Delivery systems NASAL CANNULA    pH, Ven 7.298 7.250 - 7.430   pCO2, Ven 52.8 44.0 - 60.0 mmHg   pO2, Ven 91.6 (H) 32.0 - 45.0 mmHg   Bicarbonate 23.2 20.0 - 28.0 mmol/L   Acid-base deficit 0.6 0.0 - 2.0 mmol/L   O2 Saturation 95.9 %   Drawn by [id number]    Sample type VENOUS  Comment: Performed at [facility], [address] [place], [place] [address]  Basic metabolic panel     Status: Abnormal   Collection Time: 06/13/18  6:34 AM  Result Value Ref Range   Sodium 138 135 - 145 mmol/L   Potassium 4.2 3.5 - 5.1 mmol/L   Chloride 104 101 - 111 mmol/L   CO2 23 22 - 32 mmol/L   Glucose, Bld 148 (H) 65 - 99 mg/dL   BUN 17 6 - 20 mg/dL   Creatinine, Ser 0.74 0.44 - 1.00 mg/dL   Calcium 8.9 8.9 - 10.3 mg/dL   GFR calc non Af Amer >60 >60 mL/min   GFR calc Af Amer >60 >60 mL/min    Comment: (NOTE) The eGFR has been calculated using the CKD EPI equation. This calculation has not been validated in all clinical situations. eGFR's persistently <60 mL/min signify possible Chronic Kidney Disease.    Anion gap 11 5 - 15    Comment: Performed at [facility], [address] [place], [place] [address]  CBC     Status: Abnormal   Collection Time: 06/13/18  6:34 AM  Result Value Ref Range   WBC 10.3 4.0 - 10.5 K/uL   RBC 3.91 3.87 - 5.11 MIL/uL    Hemoglobin 10.0 (L) 12.0 - 15.0 g/dL   HCT 32.1 (L) 36.0 - 46.0 %   MCV 82.1 78.0 - 100.0 fL   MCH 25.6 (L) 26.0 - 34.0 pg   MCHC 31.2 30.0 - 36.0 g/dL   RDW 15.2 11.5 - 15.5 %   Platelets 341 150 - 400 K/uL    Comment: Performed at [facility], [address] [place], [place] [address]    ABGS Recent Labs    06/12/18 2220  HCO3 23.2   CULTURES No results found for this or any previous visit (from the past 240 hour(s)). Studies/Results: Dg Chest Portable 1 View  Result Date: 06/13/2018 CLINICAL DATA:  Shortness of breath EXAM: PORTABLE CHEST 1 VIEW COMPARISON:  05/30/2018, 04/13/2018, CT chest 04/15/2018 FINDINGS: Hyperinflation with emphysematous disease. No acute airspace disease or pleural effusion. Normal heart size. No pneumothorax. IMPRESSION: No active disease.  Hyperinflation with emphysematous disease. Electronically Signed   By: Donavan Foil M.D.   On: 06/13/2018 00:38    Medications:  Prior to Admission:  Medications Prior to Admission  Medication Sig Dispense Refill Last Dose  . albuterol (PROVENTIL HFA;VENTOLIN HFA) 108 (90 BASE) MCG/ACT inhaler Inhale 2 puffs into the lungs every 6 (six) hours as needed for wheezing or shortness of breath. 1 Inhaler 1 unknown  . Multiple Vitamins-Minerals (MULTIVITAMIN ADULT) CHEW Chew 2 each by mouth daily. Vitafusion gummy   unknow n  . umeclidinium-vilanterol (ANORO ELLIPTA) 62.5-25 MCG/INH AEPB Inhale 1 puff into the lungs daily. 1 each 12 unknown   Scheduled: . azithromycin  500 mg Oral Daily  . budesonide (PULMICORT) nebulizer solution  0.25 mg Nebulization BID  . enoxaparin (LOVENOX) injection  40 mg Subcutaneous Q24H  . ipratropium  0.5 mg Nebulization Q6H  . levalbuterol  0.63 mg Nebulization Q6H  . methylPREDNISolone (SOLU-MEDROL) injection  40 mg Intravenous Q12H  . multivitamin with minerals  1 tablet Oral Daily  . pantoprazole  40 mg Oral Daily  . sodium chloride flush  3 mL Intravenous Q12H  .  umeclidinium-vilanterol  1 puff Inhalation Daily   Continuous: . sodium chloride     ZDG:UYQIHK chloride, acetaminophen **OR** acetaminophen, hydrALAZINE, ondansetron **OR** ondansetron (ZOFRAN) IV, sodium chloride flush  Assesment: She was admitted with acute hypoxic respiratory failure.  That is better.  She has COPD exacerbation and that is improving.  She has malnutrition which is chronic and I think related to depression for which she refuses medication Principal Problem:   Acute respiratory failure with hypoxia ([place]) Active Problems:   COPD exacerbation (HCC)   GERD (gastroesophageal reflux disease)   Malnutrition ([place])   Essential hypertension    Plan: Continue treatments probably discharge tomorrow.    LOS: 0 days   Katelyn [name] L 06/14/2018, 10:22 AM

## 2018-06-15 DIAGNOSIS — J441 Chronic obstructive pulmonary disease with (acute) exacerbation: Secondary | ICD-10-CM | POA: Diagnosis not present

## 2018-06-15 DIAGNOSIS — J9601 Acute respiratory failure with hypoxia: Secondary | ICD-10-CM | POA: Diagnosis not present

## 2018-06-15 MED ORDER — AZITHROMYCIN 250 MG PO TABS: ORAL_TABLET | ORAL | 0 refills | Status: DC

## 2018-06-15 MED ORDER — PREDNISONE 10 MG (21) PO TBPK: ORAL_TABLET | ORAL | 0 refills | Status: DC

## 2018-06-15 NOTE — Discharge Summary (Signed)
Physician Discharge Summary  Patient ID: Katelyn [name] MRN: [id number] DOB/AGE: 1941-04-03 77 y.o. Primary Care Physician:Otniel Hoe, Ramon Dredge, MD Admit date: 06/12/2018 Discharge date: 06/15/2018    Discharge Diagnoses:   Principal Problem:   Acute respiratory failure with hypoxia [facility]) Active Problems:   COPD exacerbation (HCC)   GERD (gastroesophageal reflux disease)   Malnutrition (HCC)   Essential hypertension   Allergies as of 06/15/2018   No Known Allergies     Medication List    TAKE these medications   albuterol 108 (90 Base) MCG/ACT inhaler Commonly known as:  PROVENTIL HFA;VENTOLIN HFA Inhale 2 puffs into the lungs every 6 (six) hours as needed for wheezing or shortness of breath.   azithromycin 250 MG tablet Commonly known as:  ZITHROMAX Z-PAK Take by package directions   MULTIVITAMIN ADULT Chew Chew 2 each by mouth daily. Vitafusion gummy   predniSONE 10 MG (21) Tbpk tablet Commonly known as:  STERAPRED UNI-PAK 21 TAB Take by package instructions   umeclidinium-vilanterol 62.5-25 MCG/INH Aepb Commonly known as:  ANORO ELLIPTA Inhale 1 puff into the lungs daily.       Discharged Condition: Improved    Consults: None  Significant Diagnostic Studies: Dg Chest Portable 1 View  Result Date: 06/13/2018 CLINICAL DATA:  Shortness of breath EXAM: PORTABLE CHEST 1 VIEW COMPARISON:  05/30/2018, 04/13/2018, CT chest 04/15/2018 FINDINGS: Hyperinflation with emphysematous disease. No acute airspace disease or pleural effusion. Normal heart size. No pneumothorax. IMPRESSION: No active disease.  Hyperinflation with emphysematous disease. Electronically Signed   By: Jasmine Pang M.D.   On: 06/13/2018 00:38   Dg Chest Portable 1 View  Result Date: 05/30/2018 CLINICAL DATA:  Shortness of breath EXAM: PORTABLE CHEST 1 VIEW COMPARISON:  04/13/2018 FINDINGS: Large lung volumes. There is emphysema by CT. Borderline heart size, stable. There is no edema, consolidation,  effusion, or pneumothorax. Trachea is mildly deviated to the left at the thoracic inlet with no nodule or mass on prior CT. IMPRESSION: COPD without acute finding. Electronically Signed   By: Marnee Spring M.D.   On: 05/30/2018 08:28    Lab Results: Basic Metabolic Panel: Recent Labs    06/12/18 2136 06/13/18 0634  NA 143 138  K 4.3 4.2  CL 107 104  CO2 29 23  GLUCOSE 105* 148*  BUN 18 17  CREATININE 0.75 0.74  CALCIUM 9.2 8.9   Liver Function Tests: No results for input(s): AST, ALT, ALKPHOS, BILITOT, PROT, ALBUMIN in the last 72 hours.   CBC: Recent Labs    06/12/18 2136 06/13/18 0634  WBC 10.0 10.3  HGB 11.1* 10.0*  HCT 36.0 32.1*  MCV 82.8 82.1  PLT 380 341    No results found for this or any previous visit (from the past 240 hour(s)).   Hospital Course: This is a 77 year old with known severe COPD who has ongoing smoking.  She developed increasing shortness of breath and came to the emergency room for evaluation.  She was treated but was not well enough to be released.  She was brought in and started on inhaled bronchodilators on oxygen steroids and antibiotics.  She improved.  She was hypertensive but that seems to have resolved she was clear at the time of discharge  Discharge Exam: Blood pressure 112/71, pulse 68, temperature 97.9 F (36.6 C), temperature source Oral, resp. rate 16, height 5\' 4"  (1.626 m), weight 39.9 kg (88 lb), SpO2 97 %. She is awake and alert.  She is very thin.  Her lungs  are clear.  Disposition: Home      Signed: Dakia Schifano L   06/15/2018, 8:39 AM

## 2018-06-15 NOTE — Progress Notes (Signed)
Pt discharged home today per Dr. Luan Pulling. Pt's IV site D/C'd and WDL. Pt's VSS. Pt provided with home medication list, discharge instructions and prescriptions. Verbalized understanding. Pt ambulated off floor in stable condition accompanied by RN.

## 2018-06-15 NOTE — Progress Notes (Signed)
Subjective: She says she feels better.  She was able to ambulate.  Oxygenation is okay.  She is coughing a little bit.  Objective: Vital signs in last 24 hours: Temp:  [97.9 F (36.6 C)-98.6 F (37 C)] 97.9 F (36.6 C) (06/24 0612) Pulse Rate:  [68-77] 68 (06/24 0612) Resp:  [15-22] 16 (06/24 0612) BP: (108-115)/(59-78) 112/71 (06/24 0612) SpO2:  [95 %-97 %] 97 % (06/24 0741) Weight change:  Last BM Date: 06/13/18  Intake/Output from previous day: 06/23 0701 - 06/24 0700 In: 1320 [P.O.:1320] Out: -   PHYSICAL EXAM General appearance: alert, cooperative, no distress and Very thin Resp: clear to auscultation bilaterally Cardio: regular rate and rhythm, S1, S2 normal, no murmur, click, rub or gallop GI: soft, non-tender; bowel sounds normal; no masses,  no organomegaly Extremities: extremities normal, atraumatic, no cyanosis or edema  Lab Results:  No results found for this or any previous visit (from the past 48 hour(s)).  ABGS Recent Labs    06/12/18 2220  HCO3 23.2   CULTURES No results found for this or any previous visit (from the past 240 hour(s)). Studies/Results: No results found.  Medications:  Prior to Admission:  Medications Prior to Admission  Medication Sig Dispense Refill Last Dose  . albuterol (PROVENTIL HFA;VENTOLIN HFA) 108 (90 BASE) MCG/ACT inhaler Inhale 2 puffs into the lungs every 6 (six) hours as needed for wheezing or shortness of breath. 1 Inhaler 1 unknown  . Multiple Vitamins-Minerals (MULTIVITAMIN ADULT) CHEW Chew 2 each by mouth daily. Vitafusion gummy   unknow n  . umeclidinium-vilanterol (ANORO ELLIPTA) 62.5-25 MCG/INH AEPB Inhale 1 puff into the lungs daily. 1 each 12 unknown   Scheduled: . azithromycin  500 mg Oral Daily  . budesonide (PULMICORT) nebulizer solution  0.25 mg Nebulization BID  . enoxaparin (LOVENOX) injection  30 mg Subcutaneous Q24H  . ipratropium  0.5 mg Nebulization Q6H  . levalbuterol  0.63 mg Nebulization Q6H  .  methylPREDNISolone (SOLU-MEDROL) injection  40 mg Intravenous Q12H  . multivitamin with minerals  1 tablet Oral Daily  . pantoprazole  40 mg Oral Daily  . sodium chloride flush  3 mL Intravenous Q12H  . umeclidinium-vilanterol  1 puff Inhalation Daily   Continuous: . sodium chloride     JAS:NKNLZJ chloride, acetaminophen **OR** acetaminophen, hydrALAZINE, ondansetron **OR** ondansetron (ZOFRAN) IV, sodium chloride flush  Assesment: She is admitted with acute hypoxic respiratory failure and COPD exacerbation.  She is substantially better and ready for discharge  She has hypertension which is a fairly new problem  She has malnutrition unchanged  She has depression but refuses medication Principal Problem:   Acute respiratory failure with hypoxia ([place]) Active Problems:   COPD exacerbation (HCC)   GERD (gastroesophageal reflux disease)   Malnutrition ([place])   Essential hypertension    Plan: Discharge home today    LOS: 0 days   [name] L 06/15/2018, 8:34 AM

## 2018-06-15 NOTE — Care Management Note (Signed)
Case Management Note  Patient Details  Name: Katelyn [name] MRN: [id number] Date of Birth: [birth year]  Subjective/Objective:  COPD exacerbation. Pt from home, ind. She has PCP, transportation and insurance with drug coverage. Per pt she is unable to afford inhaler.  Pt has neb machine and is able to afford neb treatments.               Action/Plan: DC home today with self care. CM has contacted pt's pharmacy and determines pt's ANoro costs $83.96 to have filled and her Proair is $13.65. Pt unable to afford the Proair. No assistance available for co-pay assistance. Pt aware. Pt given inhaler by RT and per Dr. Luan Pulling, his office will help with samples as needed.   Expected Discharge Date:  06/15/18               Expected Discharge Plan:  Home/Self Care  In-House Referral:  NA  Discharge planning Services  CM Consult  Post Acute Care Choice:  NA Choice offered to:  NA  Status of Service:  Completed, signed off  If discussed at Long Length of Stay Meetings, dates discussed:    Additional Comments:  Sherald Barge, RN 06/15/2018, 12:00 PM

## 2018-06-18 ENCOUNTER — Ambulatory Visit: Payer: Medicare Other | Admitting: Gastroenterology

## 2018-06-24 DIAGNOSIS — J449 Chronic obstructive pulmonary disease, unspecified: Secondary | ICD-10-CM | POA: Diagnosis not present

## 2018-06-24 DIAGNOSIS — R634 Abnormal weight loss: Secondary | ICD-10-CM | POA: Diagnosis not present

## 2018-06-24 DIAGNOSIS — E46 Unspecified protein-calorie malnutrition: Secondary | ICD-10-CM | POA: Diagnosis not present

## 2018-06-24 DIAGNOSIS — F17200 Nicotine dependence, unspecified, uncomplicated: Secondary | ICD-10-CM | POA: Diagnosis not present

## 2018-07-02 ENCOUNTER — Other Ambulatory Visit: Payer: Self-pay

## 2018-07-02 ENCOUNTER — Emergency Department (HOSPITAL_COMMUNITY): Payer: Medicare Other

## 2018-07-02 ENCOUNTER — Encounter (HOSPITAL_COMMUNITY): Payer: Self-pay | Admitting: Emergency Medicine

## 2018-07-02 ENCOUNTER — Inpatient Hospital Stay (HOSPITAL_COMMUNITY)
Admission: EM | Admit: 2018-07-02 | Discharge: 2018-07-05 | DRG: 190 | Disposition: A | Discharge status: Home / Self Care | Payer: Medicare Other | Attending: Pulmonary Disease | Admitting: Pulmonary Disease

## 2018-07-02 DIAGNOSIS — Z716 Tobacco abuse counseling: Secondary | ICD-10-CM | POA: Diagnosis not present

## 2018-07-02 DIAGNOSIS — Z79899 Other long term (current) drug therapy: Secondary | ICD-10-CM

## 2018-07-02 DIAGNOSIS — J9621 Acute and chronic respiratory failure with hypoxia: Secondary | ICD-10-CM | POA: Diagnosis present

## 2018-07-02 DIAGNOSIS — G9341 Metabolic encephalopathy: Secondary | ICD-10-CM | POA: Diagnosis not present

## 2018-07-02 DIAGNOSIS — E43 Unspecified severe protein-calorie malnutrition: Secondary | ICD-10-CM | POA: Diagnosis present

## 2018-07-02 DIAGNOSIS — J9622 Acute and chronic respiratory failure with hypercapnia: Secondary | ICD-10-CM | POA: Diagnosis present

## 2018-07-02 DIAGNOSIS — Z8719 Personal history of other diseases of the digestive system: Secondary | ICD-10-CM

## 2018-07-02 DIAGNOSIS — J441 Chronic obstructive pulmonary disease with (acute) exacerbation: Secondary | ICD-10-CM | POA: Diagnosis not present

## 2018-07-02 DIAGNOSIS — Z801 Family history of malignant neoplasm of trachea, bronchus and lung: Secondary | ICD-10-CM

## 2018-07-02 DIAGNOSIS — I10 Essential (primary) hypertension: Secondary | ICD-10-CM | POA: Diagnosis not present

## 2018-07-02 DIAGNOSIS — Z90710 Acquired absence of both cervix and uterus: Secondary | ICD-10-CM | POA: Diagnosis not present

## 2018-07-02 DIAGNOSIS — J9601 Acute respiratory failure with hypoxia: Secondary | ICD-10-CM

## 2018-07-02 DIAGNOSIS — G8929 Other chronic pain: Secondary | ICD-10-CM | POA: Diagnosis present

## 2018-07-02 DIAGNOSIS — F17210 Nicotine dependence, cigarettes, uncomplicated: Secondary | ICD-10-CM | POA: Diagnosis present

## 2018-07-02 DIAGNOSIS — R000 Tachycardia, unspecified: Secondary | ICD-10-CM | POA: Diagnosis present

## 2018-07-02 DIAGNOSIS — Z681 Body mass index (BMI) 19 or less, adult: Secondary | ICD-10-CM

## 2018-07-02 DIAGNOSIS — D509 Iron deficiency anemia, unspecified: Secondary | ICD-10-CM | POA: Diagnosis present

## 2018-07-02 DIAGNOSIS — M25522 Pain in left elbow: Secondary | ICD-10-CM | POA: Diagnosis present

## 2018-07-02 DIAGNOSIS — Z833 Family history of diabetes mellitus: Secondary | ICD-10-CM

## 2018-07-02 DIAGNOSIS — Z86010 Personal history of colonic polyps: Secondary | ICD-10-CM | POA: Diagnosis not present

## 2018-07-02 DIAGNOSIS — Z8249 Family history of ischemic heart disease and other diseases of the circulatory system: Secondary | ICD-10-CM | POA: Diagnosis not present

## 2018-07-02 DIAGNOSIS — R1310 Dysphagia, unspecified: Secondary | ICD-10-CM | POA: Diagnosis present

## 2018-07-02 DIAGNOSIS — M542 Cervicalgia: Secondary | ICD-10-CM | POA: Diagnosis present

## 2018-07-02 DIAGNOSIS — K219 Gastro-esophageal reflux disease without esophagitis: Secondary | ICD-10-CM | POA: Diagnosis present

## 2018-07-02 DIAGNOSIS — Z599 Problem related to housing and economic circumstances, unspecified: Secondary | ICD-10-CM

## 2018-07-02 DIAGNOSIS — Z9049 Acquired absence of other specified parts of digestive tract: Secondary | ICD-10-CM | POA: Diagnosis not present

## 2018-07-02 DIAGNOSIS — J9602 Acute respiratory failure with hypercapnia: Secondary | ICD-10-CM | POA: Diagnosis not present

## 2018-07-02 DIAGNOSIS — J439 Emphysema, unspecified: Secondary | ICD-10-CM | POA: Diagnosis not present

## 2018-07-02 DIAGNOSIS — Z66 Do not resuscitate: Secondary | ICD-10-CM | POA: Diagnosis present

## 2018-07-02 DIAGNOSIS — K2950 Unspecified chronic gastritis without bleeding: Secondary | ICD-10-CM | POA: Diagnosis present

## 2018-07-02 DIAGNOSIS — F329 Major depressive disorder, single episode, unspecified: Secondary | ICD-10-CM | POA: Diagnosis present

## 2018-07-02 DIAGNOSIS — R401 Stupor: Secondary | ICD-10-CM | POA: Diagnosis not present

## 2018-07-02 LAB — CBC WITH DIFFERENTIAL/PLATELET
BASOS ABS: 0.2 10*3/uL — AB (ref 0.0–0.1)
BASOS PCT: 1 %
EOS ABS: 0.8 10*3/uL — AB (ref 0.0–0.7)
Eosinophils Relative: 6 %
HEMATOCRIT: 33.9 % — AB (ref 36.0–46.0)
Hemoglobin: 10.4 g/dL — ABNORMAL LOW (ref 12.0–15.0)
Lymphocytes Relative: 59 %
Lymphs Abs: 8.1 10*3/uL — ABNORMAL HIGH (ref 0.7–4.0)
MCH: 25.9 pg — ABNORMAL LOW (ref 26.0–34.0)
MCHC: 30.7 g/dL (ref 30.0–36.0)
MCV: 84.5 fL (ref 78.0–100.0)
MONO ABS: 1.6 10*3/uL — AB (ref 0.1–1.0)
MONOS PCT: 12 %
NEUTROS ABS: 3 10*3/uL (ref 1.7–7.7)
Neutrophils Relative %: 22 %
PLATELETS: 389 10*3/uL (ref 150–400)
RBC: 4.01 MIL/uL (ref 3.87–5.11)
RDW: 16.8 % — AB (ref 11.5–15.5)
WBC: 13.7 10*3/uL — ABNORMAL HIGH (ref 4.0–10.5)

## 2018-07-02 LAB — COMPREHENSIVE METABOLIC PANEL
ALBUMIN: 3.7 g/dL (ref 3.5–5.0)
ALK PHOS: 72 U/L (ref 38–126)
ALT: 22 U/L (ref 0–44)
AST: 33 U/L (ref 15–41)
Anion gap: 9 (ref 5–15)
BILIRUBIN TOTAL: 0.5 mg/dL (ref 0.3–1.2)
BUN: 20 mg/dL (ref 8–23)
CALCIUM: 8.7 mg/dL — AB (ref 8.9–10.3)
CO2: 24 mmol/L (ref 22–32)
Chloride: 107 mmol/L (ref 98–111)
Creatinine, Ser: 1.12 mg/dL — ABNORMAL HIGH (ref 0.44–1.00)
GFR calc Af Amer: 55 mL/min — ABNORMAL LOW (ref >60)
GFR calc non Af Amer: 47 mL/min — ABNORMAL LOW (ref >60)
GLUCOSE: 269 mg/dL — AB (ref 70–99)
Potassium: 5 mmol/L (ref 3.5–5.1)
SODIUM: 140 mmol/L (ref 135–145)
TOTAL PROTEIN: 6.3 g/dL — AB (ref 6.5–8.1)

## 2018-07-02 LAB — BLOOD GAS, VENOUS
ACID-BASE DEFICIT: 8.2 mmol/L — AB (ref 0.0–2.0)
Bicarbonate: 16 mmol/L — ABNORMAL LOW (ref 20.0–28.0)
DELIVERY SYSTEMS: POSITIVE
FIO2: 50
O2 Saturation: 98 %
PATIENT TEMPERATURE: 37
PH VEN: 7.01 — AB (ref 7.250–7.430)
pCO2, Ven: 90.3 mmHg (ref 44.0–60.0)
pO2, Ven: 189 mmHg — ABNORMAL HIGH (ref 32.0–45.0)

## 2018-07-02 LAB — CBG MONITORING, ED: Glucose-Capillary: 243 mg/dL — ABNORMAL HIGH (ref 70–99)

## 2018-07-02 LAB — BRAIN NATRIURETIC PEPTIDE: B Natriuretic Peptide: 297 pg/mL — ABNORMAL HIGH (ref 0.0–100.0)

## 2018-07-02 LAB — LACTIC ACID, PLASMA
LACTIC ACID, VENOUS: 2.6 mmol/L — AB (ref 0.5–1.9)
Lactic Acid, Venous: 5.8 mmol/L (ref 0.5–1.9)

## 2018-07-02 LAB — TROPONIN I: Troponin I: <0.03 ng/mL (ref <0.03)

## 2018-07-02 MED ORDER — VANCOMYCIN HCL IN DEXTROSE 750-5 MG/150ML-% IV SOLN
750.0000 mg | Freq: Once | INTRAVENOUS | Status: AC
  Administered 2018-07-02: 750 mg via INTRAVENOUS
  Filled 2018-07-02: qty 150

## 2018-07-02 MED ORDER — MAGNESIUM SULFATE 2 GM/50ML IV SOLN
2.0000 g | Freq: Once | INTRAVENOUS | Status: AC
  Administered 2018-07-02: 2 g via INTRAVENOUS

## 2018-07-02 MED ORDER — SODIUM CHLORIDE 0.9 % IV SOLN
INTRAVENOUS | Status: DC
  Administered 2018-07-02 – 2018-07-05 (×4): via INTRAVENOUS

## 2018-07-02 MED ORDER — ONDANSETRON HCL 4 MG PO TABS: 4.0000 mg | ORAL_TABLET | Freq: Four times a day (QID) | ORAL | Status: DC | PRN

## 2018-07-02 MED ORDER — CEFEPIME HCL 1 G IJ SOLR
1.0000 g | Freq: Once | INTRAMUSCULAR | Status: AC
  Administered 2018-07-02: 1 g via INTRAVENOUS
  Filled 2018-07-02: qty 1

## 2018-07-02 MED ORDER — LEVOFLOXACIN IN D5W 500 MG/100ML IV SOLN: 500.0000 mg | INTRAVENOUS | Status: DC

## 2018-07-02 MED ORDER — MAGNESIUM SULFATE 2 GM/50ML IV SOLN
INTRAVENOUS | Status: AC
  Administered 2018-07-02: 2 g via INTRAVENOUS
  Filled 2018-07-02: qty 50

## 2018-07-02 MED ORDER — ACETAMINOPHEN 325 MG PO TABS: 650.0000 mg | ORAL_TABLET | Freq: Four times a day (QID) | ORAL | Status: DC | PRN

## 2018-07-02 MED ORDER — ONDANSETRON HCL 4 MG/2ML IJ SOLN: 4.0000 mg | Freq: Four times a day (QID) | INTRAMUSCULAR | Status: DC | PRN

## 2018-07-02 MED ORDER — ALBUTEROL SULFATE (2.5 MG/3ML) 0.083% IN NEBU: 2.5000 mg | INHALATION_SOLUTION | RESPIRATORY_TRACT | Status: DC | PRN

## 2018-07-02 MED ORDER — ORAL CARE MOUTH RINSE
15.0000 mL | Freq: Two times a day (BID) | OROMUCOSAL | Status: DC
  Administered 2018-07-03 – 2018-07-05 (×5): 15 mL via OROMUCOSAL

## 2018-07-02 MED ORDER — ALBUTEROL (5 MG/ML) CONTINUOUS INHALATION SOLN
10.0000 mg/h | INHALATION_SOLUTION | Freq: Once | RESPIRATORY_TRACT | Status: AC
  Administered 2018-07-02: 10 mg/h via RESPIRATORY_TRACT
  Filled 2018-07-02: qty 20

## 2018-07-02 MED ORDER — IPRATROPIUM-ALBUTEROL 0.5-2.5 (3) MG/3ML IN SOLN
3.0000 mL | Freq: Three times a day (TID) | RESPIRATORY_TRACT | Status: DC
  Administered 2018-07-03 – 2018-07-05 (×7): 3 mL via RESPIRATORY_TRACT
  Filled 2018-07-02 (×7): qty 3

## 2018-07-02 MED ORDER — IPRATROPIUM-ALBUTEROL 0.5-2.5 (3) MG/3ML IN SOLN
3.0000 mL | Freq: Four times a day (QID) | RESPIRATORY_TRACT | Status: DC
  Administered 2018-07-02 (×2): 3 mL via RESPIRATORY_TRACT
  Filled 2018-07-02 (×2): qty 3

## 2018-07-02 MED ORDER — ENOXAPARIN SODIUM 40 MG/0.4ML ~~LOC~~ SOLN
40.0000 mg | SUBCUTANEOUS | Status: DC
Start: 2018-07-02 — End: 2018-07-02

## 2018-07-02 MED ORDER — METHYLPREDNISOLONE SODIUM SUCC 125 MG IJ SOLR
60.0000 mg | Freq: Four times a day (QID) | INTRAMUSCULAR | Status: DC
  Administered 2018-07-02 – 2018-07-05 (×11): 60 mg via INTRAVENOUS
  Filled 2018-07-02 (×12): qty 2

## 2018-07-02 MED ORDER — ACETAMINOPHEN 650 MG RE SUPP: 650.0000 mg | Freq: Four times a day (QID) | RECTAL | Status: DC | PRN

## 2018-07-02 MED ORDER — ENOXAPARIN SODIUM 30 MG/0.3ML ~~LOC~~ SOLN
30.0000 mg | SUBCUTANEOUS | Status: DC
  Administered 2018-07-02 – 2018-07-04 (×3): 30 mg via SUBCUTANEOUS
  Filled 2018-07-02 (×3): qty 0.3

## 2018-07-02 MED ORDER — LEVOFLOXACIN IN D5W 500 MG/100ML IV SOLN
500.0000 mg | INTRAVENOUS | Status: DC
  Administered 2018-07-02 – 2018-07-04 (×2): 500 mg via INTRAVENOUS
  Filled 2018-07-02 (×3): qty 100

## 2018-07-02 MED ORDER — METHYLPREDNISOLONE SODIUM SUCC 125 MG IJ SOLR
125.0000 mg | Freq: Once | INTRAMUSCULAR | Status: AC
  Administered 2018-07-02: 125 mg via INTRAVENOUS
  Filled 2018-07-02: qty 2

## 2018-07-02 NOTE — ED Notes (Signed)
Pt is off of Bipap at this time.  Pt is tolerated well.  Will assess further

## 2018-07-02 NOTE — ED Triage Notes (Signed)
Pt c/o increased sob since yesterday, pts sat was in 35s when arrived.

## 2018-07-02 NOTE — ED Notes (Signed)
Date and time results received: 07/02/18 1006 (use smartphrase ".now" to insert current time)  Test: Lactic Acid  Critical Value: 2.6  Name of Provider Notified: Memon MD  Orders Received? Or Actions Taken?: n/a

## 2018-07-02 NOTE — ED Notes (Signed)
Respiratory panel results :  Ph: 7.01 Co2: 90.3 P02:  189 Bicarb: 16

## 2018-07-02 NOTE — ED Provider Notes (Signed)
[facility] EMERGENCY DEPARTMENT Provider Note   CSN: [id number] Arrival date & time: 07/02/18  0263  Time seen on arrival (about 06:47 AM)   History   Chief Complaint Chief Complaint  Patient presents with  . Shortness of Breath    HPI Katelyn [name] is a 77 y.o. female.  Level 5 caveat for severe respiratory distress and altered mental status  HPI per female that brought patient and she woke him up just prior to arrival saying she needed to come to the emergency department.  He states she has a lot of respiratory and heart problems.  He states she is admitted monthly for the same.  He states she seemed fine yesterday.  He states she is gone from smoking 1 pack a day to 1 to 2 cigarettes a day.  He denies that she is on oxygen at home.  PCP Katelyn Du, MD   Past Medical History:  Diagnosis Date  . Asthma   . Chronic neck pain   . Chronic pain of left elbow   . Complication of anesthesia    hard to wake  . COPD (chronic obstructive pulmonary disease) ([place])   . Dysphagia    requiring multiple dilations in past  . Headache    Patient states she needs  eyeglasses  . Irregular heart beat   . S/P colonoscopy 2009   dr. Gala Romney: anal papilla, 2 cecal AVMs. Due in 2014  . S/P endoscopy 2012   Dr. Gala Romney: mild chronic gastritis, bulbar erosions, s/p 54-French Maloney dilation   . S/P endoscopy 2003, 2004, 2007   gastritis, duodenitis, s/p dilation, +H.pylori     Patient Active Problem List   Diagnosis Date Noted  . GERD (gastroesophageal reflux disease) 06/13/2018  . Malnutrition ([place]) 06/13/2018  . Essential hypertension 06/13/2018  . Tachycardia 04/14/2018  . Acute gastritis 03/31/2018  . Esophagitis 03/31/2018  . Duodenal ulcer 03/31/2018  . Goals of care, counseling/discussion   . DNR (do not resuscitate) discussion   . Palliative care by specialist   . COPD with acute exacerbation ([place]) 03/29/2018  . COPD (chronic obstructive pulmonary disease) ([place]) 02/03/2018   . Hypoxia 11/28/2015  . COPD exacerbation ([place]) 11/28/2015  . Acute respiratory failure with hypoxia ([place]) 11/28/2015  . SOB (shortness of breath) 11/28/2015  . Mucosal abnormality of stomach   . History of colonic polyps 04/25/2015  . 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 Dysphagia 03/11/2011    Past Surgical History:  Procedure Laterality Date  . APPENDECTOMY    . BIOPSY  03/30/2018   Procedure: BIOPSY;  Surgeon: Daneil Dolin, MD;  Location: [facility] ENDO SU[facility];  Service: Gastroenterology;;  gastric  . CHOLECYSTECTOMY    . COLONOSCOPY  10/10/2008   RMR: 1. Anal papilla, otherwise normal rectum. 2. Two cecal arteriovenous malformations, colonic mucosa apperaed normal.   . COLONOSCOPY N/A 05/10/2015   RMR: Rectal and colonic polyps removed as described above. Ascending colon AVMs . Rectal polyps were tubular adenomas. Next colonoscopy May 2021.  . ESOPHAGEAL DILATION N/A 05/10/2015   Procedure: ESOPHAGEAL DILATION;  Surgeon: Daneil Dolin, MD;  Location: [facility] SU[facility];  Service: Endoscopy;  Laterality: N/A;  . ESOPHAGOGASTRODUODENOSCOPY  04/02/2011   RMR: 1. Endoscopically normal -appearing esophagus status post passage of a Maloney dilator followed by biopsy. 2. Hiatal hernia, Mottling, and submucosal petechiae in teh gastric mucoas of uncertain significance status post biopsy. 3. Bulbar erosions as described above.   . ESOPHAGOGASTRODUODENOSCOPY N/A 05/10/2015   RMR: Normal esophagus  status post [facility] dilation. Small hiatal hernia. Abnormal gastric because of doubtful clinical significance status post biopsy (benign)  . ESOPHAGOGASTRODUODENOSCOPY (EGD) WITH PROPOFOL N/A 03/30/2018   Procedure: ESOPHAGOGASTRODUODENOSCOPY (EGD) WITH PROPOFOL;  Surgeon: Daneil Dolin, MD;  Location: [facility];  Service: Gastroenterology;  Laterality: N/A;  WITH DILATION   . INGUINAL HERNIA REPAIR    . MALONEY DILATION  03/30/2018   Procedure: Venia Minks DILATION;  Surgeon: Daneil Dolin, MD;  Location: [facility];  Service: Gastroenterology;;  . TONSILLECTOMY    . TOTAL ABDOMINAL HYSTERECTOMY       OB History    Gravida  5   Para  5   Term  4   Preterm  1   AB      Living  4     SAB      TAB      Ectopic      Multiple      Live Births               Home Medications    Prior to Admission medications   Medication Sig Start Date End Date Taking? Authorizing Provider  albuterol (PROVENTIL HFA;VENTOLIN HFA) 108 (90 BASE) MCG/ACT inhaler Inhale 2 puffs into the lungs every 6 (six) hours as needed for wheezing or shortness of breath. 09/04/15   [name], MD  azithromycin (ZITHROMAX Z-PAK) 250 MG tablet Take by package directions 06/15/18   Katelyn Du, MD  Multiple Vitamins-Minerals (MULTIVITAMIN ADULT) CHEW Chew 2 each by mouth daily. Vitafusion gummy    [provider]  predniSONE (STERAPRED UNI-PAK 21 TAB) 10 MG (21) TBPK tablet Take by package instructions 06/15/18   Katelyn Du, MD  umeclidinium-vilanterol (ANORO ELLIPTA) 62.5-25 MCG/INH AEPB Inhale 1 puff into the lungs daily. 04/17/18   Katelyn Du, MD    Family History Family History  Problem Relation Age of Onset  . Heart disease Mother   . Lung cancer Father   . Cancer Sister   . Diabetes Brother   . Colon cancer Neg Hx     Social History Social History   Tobacco Use  . Smoking status: Current Every Day Smoker    Packs/day: 1.00    Years: 57.00    Pack years: 57.00    Types: Cigarettes    Start date: 08/18/1959  . Smokeless tobacco: Never Used  Substance Use Topics  . Alcohol use: No    Alcohol/week: 0.0 oz  . Drug use: No     Allergies   Patient has no known allergies.   Review of Systems Review of Systems  Unable to perform ROS: Severe respiratory distress     Physical Exam Updated Vital Signs BP (!) 179/88   Pulse (!) 132   Resp (!) 22   Wt 39.9 kg (88 lb)   SpO2 92%   BMI 15.11 kg/m   Vital signs normal except for tachycardia, hypertension, and  hypoxia   Physical Exam  Constitutional: She is oriented to person, place, and time.  Non-toxic appearance. She does not appear ill. No distress.  Thin female who appears very acutely ill, she is very pale and clammy to touch, she does not have her eyes open and is nonverbal  HENT:  Head: Normocephalic and atraumatic.  Right Ear: External ear normal.  Left Ear: External ear normal.  Nose: Nose normal. No mucosal edema or rhinorrhea.  Mouth/Throat: Mucous membranes are normal. No dental abscesses or uvula swelling.  Eyes: Pupils are equal,  round, and reactive to light. Conjunctivae and EOM are normal.  Neck: Normal range of motion and full passive range of motion without pain. Neck supple.  Cardiovascular: Regular rhythm and normal heart sounds. Tachycardia present. Exam reveals no gallop and no friction rub.  No murmur heard. Pulmonary/Chest: She is in respiratory distress. She has decreased breath sounds. She has wheezes. She has no rhonchi. She has no rales. She exhibits no tenderness and no crepitus.  Patient has minimal respiratory effort, she has scattered wheezing with hardly any airflow  Abdominal: Soft. Normal appearance and bowel sounds are normal. She exhibits no distension. There is no tenderness. There is no rebound and no guarding.  Musculoskeletal: Normal range of motion. She exhibits no edema or deformity.  Neurological: She is alert and oriented to person, place, and time. She has normal strength. No cranial nerve deficit.  Skin: Skin is intact. No rash noted. No erythema. There is pallor.  Psychiatric: Her mood appears not anxious. She is noncommunicative.  Nursing note and vitals reviewed.    ED Treatments / Results  Labs (all labs ordered are listed, but only abnormal results are displayed) Results for orders placed or performed during the hospital encounter of 07/02/18  Comprehensive metabolic panel  Result Value Ref Range   Sodium 140 135 - 145 mmol/L    Potassium 5.0 3.5 - 5.1 mmol/L   Chloride 107 98 - 111 mmol/L   CO2 24 22 - 32 mmol/L   Glucose, Bld 269 (H) 70 - 99 mg/dL   BUN 20 8 - 23 mg/dL   Creatinine, Ser 1.12 (H) 0.44 - 1.00 mg/dL   Calcium 8.7 (L) 8.9 - 10.3 mg/dL   Total Protein 6.3 (L) 6.5 - 8.1 g/dL   Albumin 3.7 3.5 - 5.0 g/dL   AST 33 15 - 41 U/L   ALT 22 0 - 44 U/L   Alkaline Phosphatase 72 38 - 126 U/L   Total Bilirubin 0.5 0.3 - 1.2 mg/dL   GFR calc non Af Amer 47 (L) >60 mL/min   GFR calc Af Amer 55 (L) >60 mL/min   Anion gap 9 5 - 15  CBC with Differential  Result Value Ref Range   WBC 13.7 (H) 4.0 - 10.5 K/uL   RBC 4.01 3.87 - 5.11 MIL/uL   Hemoglobin 10.4 (L) 12.0 - 15.0 g/dL   HCT 33.9 (L) 36.0 - 46.0 %   MCV 84.5 78.0 - 100.0 fL   MCH 25.9 (L) 26.0 - 34.0 pg   MCHC 30.7 30.0 - 36.0 g/dL   RDW 16.8 (H) 11.5 - 15.5 %   Platelets 389 150 - 400 K/uL   Neutrophils Relative % 22 %   Neutro Abs 3.0 1.7 - 7.7 K/uL   Lymphocytes Relative 59 %   Lymphs Abs 8.1 (H) 0.7 - 4.0 K/uL   Monocytes Relative 12 %   Monocytes Absolute 1.6 (H) 0.1 - 1.0 K/uL   Eosinophils Relative 6 %   Eosinophils Absolute 0.8 (H) 0.0 - 0.7 K/uL   Basophils Relative 1 %   Basophils Absolute 0.2 (H) 0.0 - 0.1 K/uL  Lactic acid, plasma  Result Value Ref Range   Lactic Acid, Venous 5.8 (HH) 0.5 - 1.9 mmol/L  Brain natriuretic peptide  Result Value Ref Range   B Natriuretic Peptide 297.0 (H) 0.0 - 100.0 pg/mL  Troponin I  Result Value Ref Range   Troponin I <0.03 <0.03 ng/mL  Blood gas, venous  Result Value Ref Range  FIO2 50.00    Delivery systems BILEVEL POSITIVE AIRWAY PRESSURE    pH, Ven 7.010 (LL) 7.250 - 7.430   pCO2, Ven 90.3 (HH) 44.0 - 60.0 mmHg   pO2, Ven 189.0 (H) 32.0 - 45.0 mmHg   Bicarbonate 16.0 (L) 20.0 - 28.0 mmol/L   Acid-base deficit 8.2 (H) 0.0 - 2.0 mmol/L   O2 Saturation 98.0 %   Patient temperature 37.0    Collection site VEIN    Drawn by DRAWN BY RN    Sample type VENOUS   CBG monitoring, ED    Result Value Ref Range   Glucose-Capillary 243 (H) 70 - 99 mg/dL   Comment 1 Notify RN    Comment 2 Document in Chart    Laboratory interpretation all normal except lactic acidosis, severe respiratory acidosis, hyperglycemia, mild renal insufficiency    EKG EKG Interpretation  Date/Time:  Thursday July 02 2018 06:52:42 EDT Ventricular Rate:  134 PR Interval:    QRS Duration: 85 QT Interval:  273 QTC Calculation: 408 R Axis:   -17 Text Interpretation:  Sinus tachycardia Multiform ventricular premature complexes Aberrant conduction of SV complex(es) LAE, consider biatrial enlargement Borderline left axis deviation Abnormal R-wave progression, early transition Electrode noise Since last tracing rate faster 12 Jun 2018 Confirmed by Rolland Porter [phone]) on 07/02/2018 7:02:51 AM   Radiology Dg Chest Port 1 View  Result Date: 07/02/2018 CLINICAL DATA:  SOB increasing x1 day. AMS. Hx of asthma, COPD, irregular heart beat. Smoker. EXAM: PORTABLE CHEST 1 VIEW COMPARISON:  06/12/2018 FINDINGS: Airway thickening is present, suggesting bronchitis or reactive airways disease. Tapering of the peripheral pulmonary vasculature favors emphysema. Upper normal heart size. Atherosclerotic calcification of the aortic arch. No appreciable bony abnormality. IMPRESSION: 1. Airway thickening is present, suggesting bronchitis or reactive airways disease. 2. Emphysema. 3.  Aortic Atherosclerosis (ICD10-I70.0). Electronically Signed   By: Van Clines M.D.   On: 07/02/2018 07:31      Dg Chest Portable 1 View  Result Date: 06/13/2018 CLINICAL DATA:  Shortness of breath EXAM: PORTABLE CHEST 1 VIEW COMPARISON:  05/30/2018, 04/13/2018, CT chest 04/15/2018 FINDINGS: Hyperinflation with emphysematous disease. No acute airspace disease or pleural effusion. Normal heart size. No pneumothorax. IMPRESSION: No active disease.  Hyperinflation with emphysematous disease. Electronically Signed   By: Donavan Foil M.D.    On: 06/13/2018 00:38    Procedures .Critical Care Performed by: Rolland Porter, MD Authorized by: Rolland Porter, MD   Critical care provider statement:    Critical care time (minutes):  40   Critical care was necessary to treat or prevent imminent or life-threatening deterioration of the following conditions:  Respiratory failure   Critical care was time spent personally by me on the following activities:  Discussions with consultants, examination of patient, obtaining history from patient or surrogate, ordering and review of laboratory studies, ordering and review of radiographic studies, ordering and performing treatments and interventions, pulse oximetry, re-evaluation of patient's condition and review of old charts   (including critical care time)  Medications Ordered in ED Medications  ceFEPIme (MAXIPIME) 1 g in sodium chloride 0.9 % 100 mL IVPB (1 g Intravenous New Bag/Given 07/02/18 0751)  vancomycin (VANCOCIN) IVPB 750 mg/150 ml premix (has no administration in time range)  albuterol (PROVENTIL,VENTOLIN) solution continuous neb (10 mg/hr Nebulization Given 07/02/18 0711)  methylPREDNISolone sodium succinate (SOLU-MEDROL) 125 mg/2 mL injection 125 mg (125 mg Intravenous Given 07/02/18 0703)  magnesium sulfate IVPB 2 g 50 mL (0 g Intravenous Stopped 07/02/18 0752)  Initial Impression / Assessment and Plan / ED Course  I have reviewed the triage vital signs and the nursing notes.  Pertinent labs & imaging results that were available during my care of the patient were reviewed by me and considered in my medical decision making (see chart for details).     Patient's initial pulse ox was in the high 40s.  She was placed on a nonrebreather mask while waiting for respiratory to arrive with BiPAP machine.  Her pulse ox improved to 92%.  When she was placed on BiPAP her oxygen improved to 100%.  At about 6:55 patient's skin color was improved, her skin was no longer clammy but was warm to  touch.  She was started on a continuous nebulizer of albuterol 10 mg, Solu-Medrol 125 mg IV and I checked her creatinine from her last visit last month and it was normal so she was given 2 g of magnesium IV.  Recheck at 7:10 AM her heart rate is now 110, her pulse ox remains 100% on BiPAP.  She has some improved air movements but still has some diffuse expiratory wheezing.  She seems more awake but she is not interactive.  Son was here and was encouraged by her improving vital signs.  He said he had to leave for 30 to 60 minutes.  Recheck at 7:40 AM patient's heart rate is now 76, respiratory rate 27, pulse ox 100%, blood pressure 127/81.  She is resting comfortably with her arms crossed over her abdomen.  When I wake her up she is much more alert, she has good eye contact.  She shakes her head yes that she is feeling much better.  We discussed admission and she agrees by shaking her head.  08:00 AM Dr Roderic Palau, will admit  Review of her chart shows on March 30, 2018 She had a Palliative Medicine consult and NP Quinn Axe and Dr Odette Fraction talked to her about CODE STATUS.  At that point she was wanted to be DNR but to have the treatable treated but no CPR and no intubation.  She also has her son, [name], who lives of her to be her healthcare power of attorney and next of kin.  I cannot tell if any formal paperwork was signed to that effect.  Final Clinical Impressions(s) / ED Diagnoses   Final diagnoses:  COPD exacerbation ([place])  Stupor  Acute respiratory failure with hypoxia and hypercapnia [facility])    Plan admission  Rolland Porter, MD, Barbette Or, MD 07/02/18 [phone]

## 2018-07-02 NOTE — H&P (Signed)
History and Physical    Katelyn [name] UYQ:[id number] DOB: 1941-12-03 DOA: 07/02/2018  PCP: Sinda Du, MD  Patient coming from: home  I have personally briefly reviewed patient's old medical records in [place]  Chief Complaint: shortness of breath  HPI: Katelyn [name] is a 77 y.o. female with medical history significant of advanced copd, who continues to smoke cigarettes, was recently discharged from the hospital at the end of June. She reports that she had been doing fairly well. Unfortunately, she continues to smoke. Son reports that she was her usual self yesterday. This morning she woke up with shortness of breath that began to progress very quickly. She did not have a fever. She has a chronic cough which is unchanged. She had no chest pain. No vomiting. She started to become somnolent. Son brought her to the ED for evaluation where she was noted to be hypoxic on room air in the 50's. She was tachycardic and hypertensive on arrival. Chest xray did not show any pneumonia and ABG showed pH of 7.0 with pCO2 in the 90s. She was started on bipap therapy and has overall improved. She has been referred for admission   Review of Systems: As per HPI otherwise 10 point review of systems negative.    Past Medical History:  Diagnosis Date  . Asthma   . Chronic neck pain   . Chronic pain of left elbow   . Complication of anesthesia    hard to wake  . COPD (chronic obstructive pulmonary disease) ([place])   . Dysphagia    requiring multiple dilations in past  . Headache    Patient states she needs  eyeglasses  . Irregular heart beat   . S/P colonoscopy 2009   dr. Gala Romney: anal papilla, 2 cecal AVMs. Due in 2014  . S/P endoscopy 2012   Dr. Gala Romney: mild chronic gastritis, bulbar erosions, s/p 54-French Maloney dilation   . S/P endoscopy 2003, 2004, 2007   gastritis, duodenitis, s/p dilation, +H.pylori     Past Surgical History:  Procedure Laterality Date  . APPENDECTOMY    . BIOPSY   03/30/2018   Procedure: BIOPSY;  Surgeon: Daneil Dolin, MD;  [facility];  Service: Gastroenterology;;  gastric  . CHOLECYSTECTOMY    . COLONOSCOPY  10/10/2008   RMR: 1. Anal papilla, otherwise normal rectum. 2. Two cecal arteriovenous malformations, colonic mucosa apperaed normal.   . COLONOSCOPY N/A 05/10/2015   RMR: Rectal and colonic polyps removed as described above. Ascending colon AVMs . Rectal polyps were tubular adenomas. Next colonoscopy May 2021.  . ESOPHAGEAL DILATION N/A 05/10/2015   Procedure: ESOPHAGEAL DILATION;  Surgeon: Daneil Dolin, MD;  [facility];  Service: Endoscopy;  Laterality: N/A;  . ESOPHAGOGASTRODUODENOSCOPY  04/02/2011   RMR: 1. Endoscopically normal -appearing esophagus status post passage of a Maloney dilator followed by biopsy. 2. Hiatal hernia, Mottling, and submucosal petechiae in teh gastric mucoas of uncertain significance status post biopsy. 3. Bulbar erosions as described above.   . ESOPHAGOGASTRODUODENOSCOPY N/A 05/10/2015   RMR: Normal esophagus status post Maloney dilation. Small hiatal hernia. Abnormal gastric because of doubtful clinical significance status post biopsy (benign)  . ESOPHAGOGASTRODUODENOSCOPY (EGD) WITH PROPOFOL N/A 03/30/2018   Procedure: ESOPHAGOGASTRODUODENOSCOPY (EGD) WITH PROPOFOL;  Surgeon: Daneil Dolin, MD;  [facility];  Service: Gastroenterology;  Laterality: N/A;  WITH DILATION   . [place]  03/30/2018   Procedure: Venia Minks  DILATION;  Surgeon: Daneil Dolin, MD;  [facility];  Service: Gastroenterology;;  . TONSILLECTOMY    . TOTAL ABDOMINAL HYSTERECTOMY       reports that she has been smoking cigarettes.  She started smoking about 58 years ago. She has a 57.00 pack-year smoking history. She has never used smokeless tobacco. She reports that she does not drink alcohol or use drugs.  No Known Allergies  Family History  Problem Relation Age  of Onset  . Heart disease Mother   . Lung cancer Father   . Cancer Sister   . Diabetes Brother   . Colon cancer Neg Hx      Prior to Admission medications   Medication Sig Start Date End Date Taking? Authorizing Provider  albuterol (PROVENTIL HFA;VENTOLIN HFA) 108 (90 BASE) MCG/ACT inhaler Inhale 2 puffs into the lungs every 6 (six) hours as needed for wheezing or shortness of breath. 09/04/15  Yes [name], MD  albuterol (PROVENTIL) (2.5 MG/3ML) 0.083% nebulizer solution Take 2.5 mg by nebulization every 4 (four) hours as needed for shortness of breath.   Yes [provider]  Multiple Vitamins-Minerals (MULTIVITAMIN ADULT) CHEW Chew 2 each by mouth daily. Vitafusion gummy   Yes [provider]  Tiotropium Bromide-Olodaterol (STIOLTO RESPIMAT) 2.5-2.5 MCG/ACT AERS Inhale 2 Inhalers into the lungs daily.    Yes [provider]  umeclidinium-vilanterol (ANORO ELLIPTA) 62.5-25 MCG/INH AEPB Inhale 1 puff into the lungs daily. 04/17/18  Yes Sinda Du, MD  azithromycin (ZITHROMAX Z-PAK) 250 MG tablet Take by package directions 06/15/18   Sinda Du, MD  predniSONE (STERAPRED UNI-PAK 21 TAB) 10 MG (21) TBPK tablet Take by package instructions 06/15/18   Sinda Du, MD    Physical Exam: Vitals:   07/02/18 0900 07/02/18 0930 07/02/18 0932 07/02/18 1000  BP: 110/62 101/84 101/84 108/62  Pulse: 75 75 (!) 4 64  Resp: (!) 21 (!) 22 (!) 26 13  SpO2: 100% 100% 100% 100%  Weight:      Height:        Constitutional: NAD, calm, comfortable Vitals:   07/02/18 0900 07/02/18 0930 07/02/18 0932 07/02/18 1000  BP: 110/62 101/84 101/84 108/62  Pulse: 75 75 (!) 4 64  Resp: (!) 21 (!) 22 (!) 26 13  SpO2: 100% 100% 100% 100%  Weight:      Height:       Eyes: PERRL, lids and conjunctivae normal ENMT: limited exam since bipap in place.  Neck: normal, supple, no masses, no thyromegaly Respiratory: clear to auscultation bilaterally, no wheezing, no crackles.  Normal respiratory effort. No accessory muscle use.  Cardiovascular: Regular rate and rhythm, no murmurs / rubs / gallops. No extremity edema. 2+ pedal pulses. No carotid bruits.  Abdomen: no tenderness, no masses palpated. No hepatosplenomegaly. Bowel sounds positive.  Musculoskeletal: no clubbing / cyanosis. No joint deformity upper and lower extremities. Good ROM, no contractures. Normal muscle tone.  Skin: no rashes, lesions, ulcers. No induration Neurologic: CN 2-12 grossly intact. Sensation intact, DTR normal. Strength 5/5 in all 4.  Psychiatric: Normal judgment and insight. Alert and oriented x 3. Normal mood.    Labs on Admission: I have personally reviewed following labs and imaging studies  CBC: Recent Labs  Lab 07/02/18 0657  WBC 13.7*  NEUTROABS 3.0  HGB 10.4*  HCT 33.9*  MCV 84.5  PLT 606   Basic Metabolic Panel: Recent Labs  Lab 07/02/18 0657  NA 140  K 5.0  CL 107  CO2  24  GLUCOSE 269*  BUN 20  CREATININE 1.12*  CALCIUM 8.7*   GFR: Estimated Creatinine Clearance: 27.8 mL/min (A) (by C-G formula based on SCr of 1.12 mg/dL (H)). Liver Function Tests: Recent Labs  Lab 07/02/18 0657  AST 33  ALT 22  ALKPHOS 72  BILITOT 0.5  PROT 6.3*  ALBUMIN 3.7   No results for input(s): LIPASE, AMYLASE in the last 168 hours. No results for input(s): AMMONIA in the last 168 hours. Coagulation Profile: No results for input(s): INR, PROTIME in the last 168 hours. Cardiac Enzymes: Recent Labs  Lab 07/02/18 0657  TROPONINI <0.03   BNP (last 3 results) No results for input(s): PROBNP in the last 8760 hours. HbA1C: No results for input(s): HGBA1C in the last 72 hours. CBG: Recent Labs  Lab 07/02/18 0652  GLUCAP 243*   Lipid Profile: No results for input(s): CHOL, HDL, LDLCALC, TRIG, CHOLHDL, LDLDIRECT in the last 72 hours. Thyroid Function Tests: No results for input(s): TSH, T4TOTAL, FREET4, T3FREE, THYROIDAB in the last 72 hours. Anemia Panel: No  results for input(s): VITAMINB12, FOLATE, FERRITIN, TIBC, IRON, RETICCTPCT in the last 72 hours. Urine analysis: No results found for: COLORURINE, APPEARANCEUR, LABSPEC, PHURINE, GLUCOSEU, HGBUR, BILIRUBINUR, KETONESUR, PROTEINUR, UROBILINOGEN, NITRITE, LEUKOCYTESUR  Radiological Exams on Admission: Dg Chest Port 1 View  Result Date: 07/02/2018 CLINICAL DATA:  SOB increasing x1 day. AMS. Hx of asthma, COPD, irregular heart beat. Smoker. EXAM: PORTABLE CHEST 1 VIEW COMPARISON:  06/12/2018 FINDINGS: Airway thickening is present, suggesting bronchitis or reactive airways disease. Tapering of the peripheral pulmonary vasculature favors emphysema. Upper normal heart size. Atherosclerotic calcification of the aortic arch. No appreciable bony abnormality. IMPRESSION: 1. Airway thickening is present, suggesting bronchitis or reactive airways disease. 2. Emphysema. 3.  Aortic Atherosclerosis (ICD10-I70.0). Electronically Signed   By: Van Clines M.D.   On: 07/02/2018 07:31    EKG: Independently reviewed. Sinus tachycardia  Assessment/Plan Active Problems:   COPD exacerbation (HCC)   COPD with acute exacerbation (HCC)   Essential hypertension   Acute metabolic encephalopathy   Acute respiratory failure with hypoxia and hypercapnia (HCC)     1. Acute respiratory failure with hypoxia and hypercapnia. Related to advanced copd. Currently on bipap and clinically she is much better. She does not appear to be in distress and is awake and alert. Can potentially give a trial off bipap later today. Will repeat ABG to follow up 2. COPD exacerbation. Start IV steroids, antibiotics and bronchodilators 3. Acute metabolic encephalopathy. Related to hypercapnia. Appears to be approaching baseline with bipap therapy 4. Tobacco abuse. Counseled on the importance of tobacco cessation 5. HTN. Blood pressure has improved as respiratory status has stabilized. Continue to follow. She is not chronically on  meds.  DVT prophylaxis: lovenox  Code Status: DNR  Family Communication: discussed with son at the bedside  Disposition Plan: discharge home once improved  Consults called:   Admission status: inpatient, stepdown   Kathie Dike MD [facility] Pager [phone]  If 7PM-7AM, please contact night-coverage [email or web address] Password [email or web address]  07/02/2018, 10:30 AM

## 2018-07-02 NOTE — ED Notes (Signed)
Date and time results received: 07/02/18 0724 (use smartphrase ".now" to insert current time)  Test: lactic acid  Critical Value: 5.8  Name of Provider Notified: Tomi Bamberger MD  Orders Received? Or Actions Taken?: none

## 2018-07-03 LAB — BLOOD GAS, ARTERIAL
ACID-BASE DEFICIT: 0.3 mmol/L (ref 0.0–2.0)
Bicarbonate: 24.3 mmol/L (ref 20.0–28.0)
DRAWN BY: 382351
FIO2: 21
O2 SAT: 94.8 %
PATIENT TEMPERATURE: 37
PCO2 ART: 36.2 mmHg (ref 32.0–48.0)
pH, Arterial: 7.428 (ref 7.350–7.450)
pO2, Arterial: 74 mmHg — ABNORMAL LOW (ref 83.0–108.0)

## 2018-07-03 LAB — CBC
HEMATOCRIT: 28.6 % — AB (ref 36.0–46.0)
Hemoglobin: 9 g/dL — ABNORMAL LOW (ref 12.0–15.0)
MCH: 25.8 pg — ABNORMAL LOW (ref 26.0–34.0)
MCHC: 31.5 g/dL (ref 30.0–36.0)
MCV: 81.9 fL (ref 78.0–100.0)
Platelets: 318 10*3/uL (ref 150–400)
RBC: 3.49 MIL/uL — ABNORMAL LOW (ref 3.87–5.11)
RDW: 16.8 % — AB (ref 11.5–15.5)
WBC: 8 10*3/uL (ref 4.0–10.5)

## 2018-07-03 LAB — BASIC METABOLIC PANEL
ANION GAP: 8 (ref 5–15)
BUN: 18 mg/dL (ref 8–23)
CALCIUM: 8.7 mg/dL — AB (ref 8.9–10.3)
CO2: 25 mmol/L (ref 22–32)
CREATININE: 0.7 mg/dL (ref 0.44–1.00)
Chloride: 108 mmol/L (ref 98–111)
Glucose, Bld: 122 mg/dL — ABNORMAL HIGH (ref 70–99)
Potassium: 4.2 mmol/L (ref 3.5–5.1)
SODIUM: 141 mmol/L (ref 135–145)

## 2018-07-03 LAB — IRON AND TIBC
Iron: 22 ug/dL — ABNORMAL LOW (ref 28–170)
Saturation Ratios: 5 % — ABNORMAL LOW (ref 10.4–31.8)
TIBC: 475 ug/dL — AB (ref 250–450)
UIBC: 453 ug/dL

## 2018-07-03 LAB — MRSA PCR SCREENING: MRSA BY PCR: NEGATIVE

## 2018-07-03 LAB — FERRITIN: FERRITIN: 8 ng/mL — AB (ref 11–307)

## 2018-07-03 MED ORDER — ROFLUMILAST 500 MCG PO TABS
250.0000 ug | ORAL_TABLET | Freq: Every day | ORAL | Status: DC
  Administered 2018-07-03 – 2018-07-05 (×3): 250 ug via ORAL
  Filled 2018-07-03 (×3): qty 1

## 2018-07-03 MED ORDER — ALBUTEROL SULFATE (2.5 MG/3ML) 0.083% IN NEBU: 3.0000 mL | INHALATION_SOLUTION | RESPIRATORY_TRACT | Status: DC | PRN

## 2018-07-03 NOTE — Progress Notes (Signed)
Subjective: She was admitted with another severe exacerbation of COPD and acute on chronic respiratory failure.  Her PCO2 was in the 90s and her pH was around 7.  This morning her PCO2 is back down to 34.  She has been worked up for pulmonary emboli and did not show that.  No evidence that she has cardiac condition producing this.  This appears to be related to cigarette smoking.  She says she is going to stop.  She feels okay this morning.  She is been able to get out of bed.  Objective: Vital signs in last 24 hours: Temp:  [98 F (36.7 C)-98.3 F (36.8 C)] 98.1 F (36.7 C) (07/12 0500) Pulse Rate:  [4-103] 67 (07/12 0700) Resp:  [9-27] 18 (07/12 0700) BP: (92-137)/(47-98) 111/69 (07/12 0700) SpO2:  [91 %-100 %] 99 % (07/12 0700) FiO2 (%):  [28 %] 28 % (07/11 2000) Weight:  [40.6 kg (89 lb 8.1 oz)] 40.6 kg (89 lb 8.1 oz) (07/11 1741) Weight change: 0 kg (0 lb)    Intake/Output from previous day: 07/11 0701 - 07/12 0700 In: 1215.8 [I.V.:815.8; IV Piggyback:400] Out: -   PHYSICAL EXAM General appearance: alert, cooperative and no distress Resp: Lungs are clear with markedly diminished breath sounds and prolonged expiratory phase Cardio: regular rate and rhythm, S1, S2 normal, no murmur, click, rub or gallop GI: soft, non-tender; bowel sounds normal; no masses,  no organomegaly Extremities: extremities normal, atraumatic, no cyanosis or edema She is very thin  Lab Results:  Results for orders placed or performed during the hospital encounter of 07/02/18 (from the past 48 hour(s))  CBG monitoring, ED     Status: Abnormal   Collection Time: 07/02/18  6:52 AM  Result Value Ref Range   Glucose-Capillary 243 (H) 70 - 99 mg/dL   Comment 1 Notify RN    Comment 2 Document in Chart   Comprehensive metabolic panel     Status: Abnormal   Collection Time: 07/02/18  6:57 AM  Result Value Ref Range   Sodium 140 135 - 145 mmol/L   Potassium 5.0 3.5 - 5.1 mmol/L   Chloride 107 98 - 111  mmol/L    Comment: Please note change in reference range.   CO2 24 22 - 32 mmol/L   Glucose, Bld 269 (H) 70 - 99 mg/dL    Comment: Please note change in reference range.   BUN 20 8 - 23 mg/dL    Comment: Please note change in reference range.   Creatinine, Ser 1.12 (H) 0.44 - 1.00 mg/dL   Calcium 8.7 (L) 8.9 - 10.3 mg/dL   Total Protein 6.3 (L) 6.5 - 8.1 g/dL   Albumin 3.7 3.5 - 5.0 g/dL   AST 33 15 - 41 U/L   ALT 22 0 - 44 U/L    Comment: Please note change in reference range.   Alkaline Phosphatase 72 38 - 126 U/L   Total Bilirubin 0.5 0.3 - 1.2 mg/dL   GFR calc non Af Amer 47 (L) >60 mL/min   GFR calc Af Amer 55 (L) >60 mL/min    Comment: (NOTE) The eGFR has been calculated using the CKD EPI equation. This calculation has not been validated in all clinical situations. eGFR's persistently <60 mL/min signify possible Chronic Kidney Disease.    Anion gap 9 5 - 15    Comment: Performed at [facility], [address], [place], [place] [address]  CBC with Differential     Status: Abnormal  Collection Time: 07/02/18  6:57 AM  Result Value Ref Range   WBC 13.7 (H) 4.0 - 10.5 K/uL    Comment: WHITE COUNT CONFIRMED ON SMEAR   RBC 4.01 3.87 - 5.11 MIL/uL   Hemoglobin 10.4 (L) 12.0 - 15.0 g/dL   HCT 33.9 (L) 36.0 - 46.0 %   MCV 84.5 78.0 - 100.0 fL   MCH 25.9 (L) 26.0 - 34.0 pg   MCHC 30.7 30.0 - 36.0 g/dL   RDW 16.8 (H) 11.5 - 15.5 %   Platelets 389 150 - 400 K/uL   Neutrophils Relative % 22 %   Neutro Abs 3.0 1.7 - 7.7 K/uL   Lymphocytes Relative 59 %   Lymphs Abs 8.1 (H) 0.7 - 4.0 K/uL   Monocytes Relative 12 %   Monocytes Absolute 1.6 (H) 0.1 - 1.0 K/uL   Eosinophils Relative 6 %   Eosinophils Absolute 0.8 (H) 0.0 - 0.7 K/uL   Basophils Relative 1 %   Basophils Absolute 0.2 (H) 0.0 - 0.1 K/uL    Comment: Performed at [facility], [address], [place], [place] [address]  Lactic acid, plasma     Status: Abnormal   Collection Time: 07/02/18  6:57 AM  Result Value Ref  Range   Lactic Acid, Venous 5.8 (HH) 0.5 - 1.9 mmol/L    Comment: CRITICAL RESULT CALLED TO, READ BACK BY AND VERIFIED WITH: EVERRETTE,R AT 7:25AM ON 07/02/18 BY [facility] Performed at [facility], [address] [place], [place] [address]   Brain natriuretic peptide     Status: Abnormal   Collection Time: 07/02/18  6:57 AM  Result Value Ref Range   B Natriuretic Peptide 297.0 (H) 0.0 - 100.0 pg/mL    Comment: Performed at [facility], [address], [place], [place] [address]  Troponin I     Status: None   Collection Time: 07/02/18  6:57 AM  Result Value Ref Range   Troponin I <0.03 <0.03 ng/mL    Comment: Performed at [facility], [address] [place], [place] [address]  Blood gas, venous     Status: Abnormal   Collection Time: 07/02/18  7:16 AM  Result Value Ref Range   FIO2 50.00    Delivery systems BILEVEL POSITIVE AIRWAY PRESSURE    pH, Ven 7.010 (LL) 7.250 - 7.430    Comment: CRITICAL RESULT CALLED TO, READ BACK BY AND VERIFIED WITH: RACHEL EVERETTE,RN AT 0730 BY KATEY FARGIS,RRT,RCP ON 07/02/2018    pCO2, Ven 90.3 (HH) 44.0 - 60.0 mmHg    Comment: CRITICAL RESULT CALLED TO, READ BACK BY AND VERIFIED WITH: RACHEL EVERETTE,RN AT 0730 BY KATEY FARGIS,RRT,RCP ON 07/02/2018    pO2, Ven 189.0 (H) 32.0 - 45.0 mmHg   Bicarbonate 16.0 (L) 20.0 - 28.0 mmol/L   Acid-base deficit 8.2 (H) 0.0 - 2.0 mmol/L   O2 Saturation 98.0 %   Patient temperature 37.0    Collection site VEIN    Drawn by DRAWN BY RN    Sample type VENOUS     Comment: Performed at [facility], [address] [place], [place] [address]  Lactic acid, plasma     Status: Abnormal   Collection Time: 07/02/18  9:41 AM  Result Value Ref Range   Lactic Acid, Venous 2.6 (HH) 0.5 - 1.9 mmol/L    Comment: CRITICAL RESULT CALLED TO, READ BACK BY AND VERIFIED WITH: EVERRETT,R AT 10:10AM ON 07/02/18 BY [facility] Performed at [facility], [address], [place], [place] [address]  CBC     Status: Abnormal    Collection Time: 07/03/18  4:46 AM  Result Value Ref Range   WBC 8.0 4.0 - 10.5 K/uL   RBC 3.49 (L) 3.87 - 5.11 MIL/uL   Hemoglobin 9.0 (L) 12.0 - 15.0 g/dL   HCT 28.6 (L) 36.0 - 46.0 %   MCV 81.9 78.0 - 100.0 fL   MCH 25.8 (L) 26.0 - 34.0 pg   MCHC 31.5 30.0 - 36.0 g/dL   RDW 16.8 (H) 11.5 - 15.5 %   Platelets 318 150 - 400 K/uL    Comment: Performed at [facility], [address] [place], [place] [address]  Basic metabolic panel     Status: Abnormal   Collection Time: 07/03/18  4:46 AM  Result Value Ref Range   Sodium 141 135 - 145 mmol/L   Potassium 4.2 3.5 - 5.1 mmol/L   Chloride 108 98 - 111 mmol/L    Comment: Please note change in reference range.   CO2 25 22 - 32 mmol/L   Glucose, Bld 122 (H) 70 - 99 mg/dL    Comment: Please note change in reference range.   BUN 18 8 - 23 mg/dL    Comment: Please note change in reference range.   Creatinine, Ser 0.70 0.44 - 1.00 mg/dL   Calcium 8.7 (L) 8.9 - 10.3 mg/dL   GFR calc non Af Amer >60 >60 mL/min   GFR calc Af Amer >60 >60 mL/min    Comment: (NOTE) The eGFR has been calculated using the CKD EPI equation. This calculation has not been validated in all clinical situations. eGFR's persistently <60 mL/min signify possible Chronic Kidney Disease.    Anion gap 8 5 - 15    Comment: Performed at [facility], [address] [place], [place] [address]  Blood gas, arterial     Status: Abnormal   Collection Time: 07/03/18  5:33 AM  Result Value Ref Range   FIO2 21.00    pH, Arterial 7.428 7.350 - 7.450   pCO2 arterial 36.2 32.0 - 48.0 mmHg   pO2, Arterial 74.0 (L) 83.0 - 108.0 mmHg   Bicarbonate 24.3 20.0 - 28.0 mmol/L   Acid-base deficit 0.3 0.0 - 2.0 mmol/L   O2 Saturation 94.8 %   Patient temperature 37.0    Collection site BRACHIAL ARTERY    Drawn by [id number]    Sample type ARTERIAL DRAW    Allens test (pass/fail) PASS PASS    Comment: Performed at [facility], [address] [place], [place] [address]    ABGS Recent  Labs    07/03/18 0533  PHART 7.428  PO2ART 74.0*  HCO3 24.3   CULTURES No results found for this or any previous visit (from the past 240 hour(s)). Studies/Results: Dg Chest Port 1 View  Result Date: 07/02/2018 CLINICAL DATA:  SOB increasing x1 day. AMS. Hx of asthma, COPD, irregular heart beat. Smoker. EXAM: PORTABLE CHEST 1 VIEW COMPARISON:  06/12/2018 FINDINGS: Airway thickening is present, suggesting bronchitis or reactive airways disease. Tapering of the peripheral pulmonary vasculature favors emphysema. Upper normal heart size. Atherosclerotic calcification of the aortic arch. No appreciable bony abnormality. IMPRESSION: 1. Airway thickening is present, suggesting bronchitis or reactive airways disease. 2. Emphysema. 3.  Aortic Atherosclerosis (ICD10-I70.0). Electronically Signed   By: Van Clines M.D.   On: 07/02/2018 07:31    Medications:  Prior to Admission:  Medications Prior to Admission  Medication Sig Dispense Refill Last Dose  . albuterol (PROVENTIL HFA;VENTOLIN  HFA) 108 (90 BASE) MCG/ACT inhaler Inhale 2 puffs into the lungs every 6 (six) hours as needed for wheezing or shortness of breath. 1 Inhaler 1 07/02/2018 at Unknown time  . albuterol (PROVENTIL) (2.5 MG/3ML) 0.083% nebulizer solution Take 2.5 mg by nebulization every 4 (four) hours as needed for shortness of breath.   07/01/2018 at Unknown time  . Multiple Vitamins-Minerals (MULTIVITAMIN ADULT) CHEW Chew 2 each by mouth daily. Vitafusion gummy   07/01/2018 at Unknown time  . Tiotropium Bromide-Olodaterol (STIOLTO RESPIMAT) 2.5-2.5 MCG/ACT AERS Inhale 2 Inhalers into the lungs daily.    07/01/2018 at Unknown time  . umeclidinium-vilanterol (ANORO ELLIPTA) 62.5-25 MCG/INH AEPB Inhale 1 puff into the lungs daily. 1 each 12 07/01/2018 at Unknown time  . azithromycin (ZITHROMAX Z-PAK) 250 MG tablet Take by package directions 6 each 0   . predniSONE (STERAPRED UNI-PAK 21 TAB) 10 MG (21) TBPK tablet Take by package  instructions 21 tablet 0    Scheduled: . enoxaparin (LOVENOX) injection  30 mg Subcutaneous Q24H  . ipratropium-albuterol  3 mL Nebulization TID  . mouth rinse  15 mL Mouth Rinse BID  . methylPREDNISolone (SOLU-MEDROL) injection  60 mg Intravenous Q6H   Continuous: . sodium chloride 50 mL/hr at 07/03/18 0700  . levofloxacin (LEVAQUIN) IV Stopped (07/02/18 1545)   VQQ:[id number] **OR** acetaminophen, albuterol, ondansetron **OR** ondansetron (ZOFRAN) IV  Assesment: She was admitted with COPD exacerbation and acute hypercapnic and hypoxic respiratory failure.  She required BiPAP but is off now.  She is back essentially to baseline.  She is on IV steroids and IV antibiotics inhaled bronchodilators.  She has malnutrition and she is lost a substantial amount of weight over the last 2 or 3 years.  She has ongoing nicotine abuse and she says she is going to stop  She had acute metabolic encephalopathy related to hypercapnia and hypoxia and that has resolved  She has ongoing problems with depression but refuses medication  Part of the issue has been that she has significant financial issues and frequently cannot afford her medication Active Problems:   COPD exacerbation ([place])   COPD with acute exacerbation ([place])   Essential hypertension   Acute metabolic encephalopathy   Acute respiratory failure with hypoxia and hypercapnia ([place])    Plan: Continue current treatments.  Transfer from stepdown.  Add Daliresp.    LOS: 1 day   , L 07/03/2018, 8:03 AM

## 2018-07-03 NOTE — Care Management Important Message (Signed)
Important Message  Patient Details  Name: Katelyn [name] MRN: [id number] Date of Birth: 05-16-1941   Medicare Important Message Given:  Yes    Shelda Altes 07/03/2018, 1:39 PM

## 2018-07-04 DIAGNOSIS — D509 Iron deficiency anemia, unspecified: Secondary | ICD-10-CM | POA: Diagnosis present

## 2018-07-04 NOTE — Progress Notes (Signed)
Subjective: She says she feels better.  She has a large number of family members with her from [place] and she gave permission to discuss her situation with them.  Objective: Vital signs in last 24 hours: Temp:  [98 F (36.7 C)-98.1 F (36.7 C)] 98 F (36.7 C) (07/13 0532) Pulse Rate:  [68-100] 68 (07/13 0532) Resp:  [16-18] 16 (07/13 0532) BP: (100-115)/(57-76) 104/58 (07/13 0532) SpO2:  [96 %-99 %] 97 % (07/13 0745) Weight change:  Last BM Date: 07/03/18  Intake/Output from previous day: 07/12 0701 - 07/13 0700 In: 1846.7 [P.O.:840; I.V.:1006.7] Out: -   PHYSICAL EXAM General appearance: alert, cooperative and no distress Resp: clear to auscultation bilaterally Cardio: regular rate and rhythm, S1, S2 normal, no murmur, click, rub or gallop GI: soft, non-tender; bowel sounds normal; no masses,  no organomegaly Extremities: extremities normal, atraumatic, no cyanosis or edema  Lab Results:  Results for orders placed or performed during the hospital encounter of 07/02/18 (from the past 48 hour(s))  MRSA PCR Screening     Status: None   Collection Time: 07/02/18  5:44 PM  Result Value Ref Range   MRSA by PCR NEGATIVE NEGATIVE    Comment:        The GeneXpert MRSA Assay (FDA approved for NASAL specimens only), is one component of a comprehensive MRSA colonization surveillance program. It is not intended to diagnose MRSA infection nor to guide or monitor treatment for MRSA infections. Performed at [facility], [address], [place], [place] [address]   CBC     Status: Abnormal   Collection Time: 07/03/18  4:46 AM  Result Value Ref Range   WBC 8.0 4.0 - 10.5 K/uL   RBC 3.49 (L) 3.87 - 5.11 MIL/uL   Hemoglobin 9.0 (L) 12.0 - 15.0 g/dL   HCT 28.6 (L) 36.0 - 46.0 %   MCV 81.9 78.0 - 100.0 fL   MCH 25.8 (L) 26.0 - 34.0 pg   MCHC 31.5 30.0 - 36.0 g/dL   RDW 16.8 (H) 11.5 - 15.5 %   Platelets 318 150 - 400 K/uL    Comment: Performed at [facility], [address] [place], [place] [address]  Basic metabolic panel     Status: Abnormal   Collection Time: 07/03/18  4:46 AM  Result Value Ref Range   Sodium 141 135 - 145 mmol/L   Potassium 4.2 3.5 - 5.1 mmol/L   Chloride 108 98 - 111 mmol/L    Comment: Please note change in reference range.   CO2 25 22 - 32 mmol/L   Glucose, Bld 122 (H) 70 - 99 mg/dL    Comment: Please note change in reference range.   BUN 18 8 - 23 mg/dL    Comment: Please note change in reference range.   Creatinine, Ser 0.70 0.44 - 1.00 mg/dL   Calcium 8.7 (L) 8.9 - 10.3 mg/dL   GFR calc non Af Amer >60 >60 mL/min   GFR calc Af Amer >60 >60 mL/min    Comment: (NOTE) The eGFR has been calculated using the CKD EPI equation. This calculation has not been validated in all clinical situations. eGFR's persistently <60 mL/min signify possible Chronic Kidney Disease.    Anion gap 8 5 - 15    Comment: Performed at [facility], [address], [place], [place] [address]  Blood gas, arterial     Status: Abnormal   Collection Time: 07/03/18  5:33 AM  Result Value Ref Range   FIO2  21.00    pH, Arterial 7.428 7.350 - 7.450   pCO2 arterial 36.2 32.0 - 48.0 mmHg   pO2, Arterial 74.0 (L) 83.0 - 108.0 mmHg   Bicarbonate 24.3 20.0 - 28.0 mmol/L   Acid-base deficit 0.3 0.0 - 2.0 mmol/L   O2 Saturation 94.8 %   Patient temperature 37.0    Collection site BRACHIAL ARTERY    Drawn by [id number]    Sample type ARTERIAL DRAW    Allens test (pass/fail) PASS PASS    Comment: Performed at [facility], [address] [place], [place] [address]  Iron and TIBC     Status: Abnormal   Collection Time: 07/03/18  5:57 AM  Result Value Ref Range   Iron 22 (L) 28 - 170 ug/dL   TIBC 475 (H) 250 - 450 ug/dL   Saturation Ratios 5 (L) 10.4 - 31.8 %   UIBC 453 ug/dL    Comment: Performed at [facility], [place] [address], [place], [place] [address]  Ferritin     Status: Abnormal   Collection Time: 07/03/18  5:57 AM  Result Value Ref Range    Ferritin 8 (L) 11 - 307 ng/mL    Comment: Performed at [facility], [place] [address], [place], [place] [address]    ABGS Recent Labs    07/03/18 0533  PHART 7.428  PO2ART 74.0*  HCO3 24.3   CULTURES Recent Results (from the past 240 hour(s))  MRSA PCR Screening     Status: None   Collection Time: 07/02/18  5:44 PM  Result Value Ref Range Status   MRSA by PCR NEGATIVE NEGATIVE Final    Comment:        The GeneXpert MRSA Assay (FDA approved for NASAL specimens only), is one component of a comprehensive MRSA colonization surveillance program. It is not intended to diagnose MRSA infection nor to guide or monitor treatment for MRSA infections. Performed at [facility], [address], [place], [place] [address]    Studies/Results: No results found.  Medications:  Prior to Admission:  Medications Prior to Admission  Medication Sig Dispense Refill Last Dose  . albuterol (PROVENTIL HFA;VENTOLIN HFA) 108 (90 BASE) MCG/ACT inhaler Inhale 2 puffs into the lungs every 6 (six) hours as needed for wheezing or shortness of breath. 1 Inhaler 1 07/02/2018 at Unknown time  . albuterol (PROVENTIL) (2.5 MG/3ML) 0.083% nebulizer solution Take 2.5 mg by nebulization every 4 (four) hours as needed for shortness of breath.   07/01/2018 at Unknown time  . Multiple Vitamins-Minerals (MULTIVITAMIN ADULT) CHEW Chew 2 each by mouth daily. Vitafusion gummy   07/01/2018 at Unknown time  . Tiotropium Bromide-Olodaterol (STIOLTO RESPIMAT) 2.5-2.5 MCG/ACT AERS Inhale 2 Inhalers into the lungs daily.    07/01/2018 at Unknown time  . umeclidinium-vilanterol (ANORO ELLIPTA) 62.5-25 MCG/INH AEPB Inhale 1 puff into the lungs daily. 1 each 12 07/01/2018 at Unknown time  . azithromycin (ZITHROMAX Z-PAK) 250 MG tablet Take by package directions 6 each 0   . predniSONE (STERAPRED UNI-PAK 21 TAB) 10 MG (21) TBPK tablet Take by package instructions 21 tablet 0    Scheduled: . enoxaparin (LOVENOX) injection  30 mg  Subcutaneous Q24H  . ipratropium-albuterol  3 mL Nebulization TID  . mouth rinse  15 mL Mouth Rinse BID  . methylPREDNISolone (SOLU-MEDROL) injection  60 mg Intravenous Q6H  . roflumilast  250 mcg Oral Daily   Continuous: . sodium chloride 50 mL/hr at 07/04/18 0318  . levofloxacin (LEVAQUIN) IV  Stopped (07/02/18 1545)   YYY:[id number] **OR** acetaminophen, albuterol, ondansetron **OR** ondansetron (ZOFRAN) IV  Assesment: She was admitted with COPD exacerbation and acute hypoxic and hypercapnic respiratory failure.  This resolved fairly quickly with BiPAP.  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  She has continued nicotine abuse but says she has now stopped.  She has iron deficiency anemia and had gastritis on endoscopy about a month ago.  She had acute metabolic encephalopathy that has resolved  She has severe malnutrition on supplements Active Problems:   COPD exacerbation (HCC)   COPD with acute exacerbation (HCC)   Essential hypertension   Acute metabolic encephalopathy   Acute respiratory failure with hypoxia and hypercapnia (HCC)   IDA (iron deficiency anemia)    Plan: No change in treatments.  Potential discharge tomorrow.    LOS: 2 days   Dhani Dannemiller L 07/04/2018, 10:01 AM

## 2018-07-05 MED ORDER — PREDNISONE 10 MG (21) PO TBPK: ORAL_TABLET | ORAL | 1 refills | Status: DC

## 2018-07-05 MED ORDER — ROFLUMILAST 500 MCG PO TABS: 250.0000 ug | ORAL_TABLET | Freq: Every day | ORAL | 12 refills | Status: DC

## 2018-07-05 MED ORDER — LEVOFLOXACIN 500 MG PO TABS
500.0000 mg | ORAL_TABLET | Freq: Every day | ORAL | Status: DC
  Administered 2018-07-05: 500 mg via ORAL
  Filled 2018-07-05: qty 1

## 2018-07-05 MED ORDER — LEVOFLOXACIN 500 MG PO TABS: 500.0000 mg | ORAL_TABLET | Freq: Every day | ORAL | 0 refills | Status: AC

## 2018-07-05 MED ORDER — PREDNISONE 20 MG PO TABS
40.0000 mg | ORAL_TABLET | Freq: Every day | ORAL | Status: DC
  Administered 2018-07-05: 40 mg via ORAL
  Filled 2018-07-05: qty 2

## 2018-07-05 NOTE — Progress Notes (Signed)
Subjective: She says she feels better and wants to go home.  Objective: Vital signs in last 24 hours: Temp:  [98 F (36.7 C)-98.1 F (36.7 C)] 98 F (36.7 C) (07/14 0535) Pulse Rate:  [69-92] 69 (07/14 0535) Resp:  [18-20] 18 (07/14 0535) BP: (123-130)/(70-80) 130/70 (07/14 0535) SpO2:  [94 %-98 %] 97 % (07/14 0735) Weight change:  Last BM Date: 07/03/18  Intake/Output from previous day: 07/13 0701 - 07/14 0700 In: 1901.7 [P.O.:720; I.V.:1181.7] Out: -   PHYSICAL EXAM General appearance: alert, cooperative and no distress Resp: clear to auscultation bilaterally Cardio: regular rate and rhythm, S1, S2 normal, no murmur, click, rub or gallop GI: soft, non-tender; bowel sounds normal; no masses,  no organomegaly Extremities: extremities normal, atraumatic, no cyanosis or edema  Lab Results:  No results found for this or any previous visit (from the past 48 hour(s)).  ABGS Recent Labs    07/03/18 0533  PHART 7.428  PO2ART 74.0*  HCO3 24.3   CULTURES Recent Results (from the past 240 hour(s))  MRSA PCR Screening     Status: None   Collection Time: 07/02/18  5:44 PM  Result Value Ref Range Status   MRSA by PCR NEGATIVE NEGATIVE Final    Comment:        The GeneXpert MRSA Assay (FDA approved for NASAL specimens only), is one component of a comprehensive MRSA colonization surveillance program. It is not intended to diagnose MRSA infection nor to guide or monitor treatment for MRSA infections. Performed at [facility], [address] [place], [place] [address]    Studies/Results: No results found.  Medications:  Prior to Admission:  Medications Prior to Admission  Medication Sig Dispense Refill Last Dose  . albuterol (PROVENTIL HFA;VENTOLIN HFA) 108 (90 BASE) MCG/ACT inhaler Inhale 2 puffs into the lungs every 6 (six) hours as needed for wheezing or shortness of breath. 1 Inhaler 1 07/02/2018 at Unknown time  . albuterol (PROVENTIL) (2.5 MG/3ML) 0.083%  nebulizer solution Take 2.5 mg by nebulization every 4 (four) hours as needed for shortness of breath.   07/01/2018 at Unknown time  . Multiple Vitamins-Minerals (MULTIVITAMIN ADULT) CHEW Chew 2 each by mouth daily. Vitafusion gummy   07/01/2018 at Unknown time  . Tiotropium Bromide-Olodaterol (STIOLTO RESPIMAT) 2.5-2.5 MCG/ACT AERS Inhale 2 Inhalers into the lungs daily.    07/01/2018 at Unknown time  . umeclidinium-vilanterol (ANORO ELLIPTA) 62.5-25 MCG/INH AEPB Inhale 1 puff into the lungs daily. 1 each 12 07/01/2018 at Unknown time  . azithromycin (ZITHROMAX Z-PAK) 250 MG tablet Take by package directions 6 each 0   . predniSONE (STERAPRED UNI-PAK 21 TAB) 10 MG (21) TBPK tablet Take by package instructions 21 tablet 0    Scheduled: . enoxaparin (LOVENOX) injection  30 mg Subcutaneous Q24H  . ipratropium-albuterol  3 mL Nebulization TID  . levofloxacin  500 mg Oral Daily  . mouth rinse  15 mL Mouth Rinse BID  . predniSONE  40 mg Oral Q breakfast  . roflumilast  250 mcg Oral Daily   Continuous: . sodium chloride 50 mL/hr at 07/05/18 0112   UMP:[id number] **OR** acetaminophen, albuterol, ondansetron **OR** ondansetron (ZOFRAN) IV  Assesment: She was admitted with COPD exacerbation and acute on chronic hypoxic and hypercapnic respiratory failure.  She had acute metabolic encephalopathy from that.  She is remarkably improved.  She has iron deficiency anemia and she will be on iron.  She is ready for discharge Active Problems:   COPD exacerbation ([place])   COPD with  acute exacerbation ([place])   Essential hypertension   Acute metabolic encephalopathy   Acute respiratory failure with hypoxia and hypercapnia (HCC)   IDA (iron deficiency anemia)    Plan: Discharge home today    LOS: 3 days   Stasha Naraine L 07/05/2018, 10:24 AM

## 2018-07-05 NOTE — Discharge Summary (Signed)
Physician Discharge Summary  Patient ID: ESBEYDI SOELLNER MRN: [id number] DOB/AGE: 1941-08-29 77 y.o. Primary Care Physician:Josephine Rudnick, Ramon Dredge, MD Admit date: 07/02/2018 Discharge date: 07/05/2018    Discharge Diagnoses:   Active Problems:   COPD exacerbation (HCC)   COPD with acute exacerbation (HCC)   Essential hypertension   Acute metabolic encephalopathy   Acute respiratory failure with hypoxia and hypercapnia (HCC)   IDA (iron deficiency anemia) Malnutrition  Allergies as of 07/05/2018   No Known Allergies     Medication List    STOP taking these medications   azithromycin 250 MG tablet Commonly known as:  ZITHROMAX Z-PAK   umeclidinium-vilanterol 62.5-25 MCG/INH Aepb Commonly known as:  ANORO ELLIPTA     TAKE these medications   albuterol (2.5 MG/3ML) 0.083% nebulizer solution Commonly known as:  PROVENTIL Take 2.5 mg by nebulization every 4 (four) hours as needed for shortness of breath.   albuterol 108 (90 Base) MCG/ACT inhaler Commonly known as:  PROVENTIL HFA;VENTOLIN HFA Inhale 2 puffs into the lungs every 6 (six) hours as needed for wheezing or shortness of breath.   levofloxacin 500 MG tablet Commonly known as:  LEVAQUIN Take 1 tablet (500 mg total) by mouth daily for 7 days.   MULTIVITAMIN ADULT Chew Chew 2 each by mouth daily. Vitafusion gummy   predniSONE 10 MG (21) Tbpk tablet Commonly known as:  STERAPRED UNI-PAK 21 TAB Take by package instructions   roflumilast 500 MCG Tabs tablet Commonly known as:  DALIRESP Take 0.5 tablets (250 mcg total) by mouth daily.   STIOLTO RESPIMAT 2.5-2.5 MCG/ACT Aers Generic drug:  Tiotropium Bromide-Olodaterol Inhale 2 Inhalers into the lungs daily.       Discharged Condition: Improved    Consults: None  Significant Diagnostic Studies: Dg Chest Port 1 View  Result Date: 07/02/2018 CLINICAL DATA:  SOB increasing x1 day. AMS. Hx of asthma, COPD, irregular heart beat. Smoker. EXAM: PORTABLE CHEST 1 VIEW  COMPARISON:  06/12/2018 FINDINGS: Airway thickening is present, suggesting bronchitis or reactive airways disease. Tapering of the peripheral pulmonary vasculature favors emphysema. Upper normal heart size. Atherosclerotic calcification of the aortic arch. No appreciable bony abnormality. IMPRESSION: 1. Airway thickening is present, suggesting bronchitis or reactive airways disease. 2. Emphysema. 3.  Aortic Atherosclerosis (ICD10-I70.0). Electronically Signed   By: Gaylyn Rong M.D.   On: 07/02/2018 07:31   Dg Chest Portable 1 View  Result Date: 06/13/2018 CLINICAL DATA:  Shortness of breath EXAM: PORTABLE CHEST 1 VIEW COMPARISON:  05/30/2018, 04/13/2018, CT chest 04/15/2018 FINDINGS: Hyperinflation with emphysematous disease. No acute airspace disease or pleural effusion. Normal heart size. No pneumothorax. IMPRESSION: No active disease.  Hyperinflation with emphysematous disease. Electronically Signed   By: Jasmine Pang M.D.   On: 06/13/2018 00:38    Lab Results: Basic Metabolic Panel: Recent Labs    07/03/18 0446  NA 141  K 4.2  CL 108  CO2 25  GLUCOSE 122*  BUN 18  CREATININE 0.70  CALCIUM 8.7*   Liver Function Tests: No results for input(s): AST, ALT, ALKPHOS, BILITOT, PROT, ALBUMIN in the last 72 hours.   CBC: Recent Labs    07/03/18 0446  WBC 8.0  HGB 9.0*  HCT 28.6*  MCV 81.9  PLT 318    Recent Results (from the past 240 hour(s))  MRSA PCR Screening     Status: None   Collection Time: 07/02/18  5:44 PM  Result Value Ref Range Status   MRSA by PCR NEGATIVE NEGATIVE Final  Comment:        The GeneXpert MRSA Assay (FDA approved for NASAL specimens only), is one component of a comprehensive MRSA colonization surveillance program. It is not intended to diagnose MRSA infection nor to guide or monitor treatment for MRSA infections. Performed at [facility], [address], [place], [place] [address]      Hospital Course: She came to the hospital with  altered mental status.  She had been in her usual state of poor health at home and developed increasing shortness of breath and became very somnolent.  When she was seen in the emergency room she was noted to be hypoxic and hypercapnic with a PCO2 of approximately 90 and a pH of 7.0.  She was started on BiPAP and improved.  She was treated with intravenous antibiotics inhaled bronchodilators and intravenous steroids.  She was able to ambulate.  By the next morning she was back on nasal cannula and the next morning she was on no oxygen at all.  She was back at baseline at the time of discharge.  She says she is going to stop smoking cigarettes.  Discharge Exam: Blood pressure 130/70, pulse 69, temperature 98 F (36.7 C), temperature source Oral, resp. rate 18, height 5\' 4"  (1.626 m), weight 40.6 kg (89 lb 8.1 oz), SpO2 97 %. She is awake and alert.  She is very thin.  Her chest is clear.  Heart is regular  Disposition: Home she does not want home health services      Signed: Alaura Schippers L   07/05/2018, 10:32 AM

## 2018-07-07 DIAGNOSIS — E46 Unspecified protein-calorie malnutrition: Secondary | ICD-10-CM | POA: Diagnosis not present

## 2018-07-07 DIAGNOSIS — R634 Abnormal weight loss: Secondary | ICD-10-CM | POA: Diagnosis not present

## 2018-07-07 DIAGNOSIS — F17200 Nicotine dependence, unspecified, uncomplicated: Secondary | ICD-10-CM | POA: Diagnosis not present

## 2018-07-07 DIAGNOSIS — J449 Chronic obstructive pulmonary disease, unspecified: Secondary | ICD-10-CM | POA: Diagnosis not present

## 2018-07-28 DIAGNOSIS — J449 Chronic obstructive pulmonary disease, unspecified: Secondary | ICD-10-CM | POA: Diagnosis not present

## 2018-07-28 DIAGNOSIS — E46 Unspecified protein-calorie malnutrition: Secondary | ICD-10-CM | POA: Diagnosis not present

## 2018-07-28 DIAGNOSIS — F17200 Nicotine dependence, unspecified, uncomplicated: Secondary | ICD-10-CM | POA: Diagnosis not present

## 2018-07-28 DIAGNOSIS — R413 Other amnesia: Secondary | ICD-10-CM | POA: Diagnosis not present

## 2018-07-28 DIAGNOSIS — E559 Vitamin D deficiency, unspecified: Secondary | ICD-10-CM | POA: Diagnosis not present

## 2018-07-28 LAB — LIPID PANEL
Cholesterol: 202 — AB (ref 0–200)
HDL: 80 — AB (ref 35–70)
LDL Cholesterol: 101
Triglycerides: 109 (ref 40–160)

## 2018-07-28 LAB — VITAMIN D 25 HYDROXY (VIT D DEFICIENCY, FRACTURES): Vit D, 25-Hydroxy: 28

## 2018-07-28 LAB — VITAMIN B12: Vitamin B-12: 492

## 2018-07-29 DIAGNOSIS — M9901 Segmental and somatic dysfunction of cervical region: Secondary | ICD-10-CM | POA: Diagnosis not present

## 2018-07-29 DIAGNOSIS — M5412 Radiculopathy, cervical region: Secondary | ICD-10-CM | POA: Diagnosis not present

## 2018-07-31 DIAGNOSIS — M9901 Segmental and somatic dysfunction of cervical region: Secondary | ICD-10-CM | POA: Diagnosis not present

## 2018-07-31 DIAGNOSIS — M5412 Radiculopathy, cervical region: Secondary | ICD-10-CM | POA: Diagnosis not present

## 2018-08-26 DIAGNOSIS — M9901 Segmental and somatic dysfunction of cervical region: Secondary | ICD-10-CM | POA: Diagnosis not present

## 2018-08-26 DIAGNOSIS — M5412 Radiculopathy, cervical region: Secondary | ICD-10-CM | POA: Diagnosis not present

## 2018-08-31 DIAGNOSIS — D500 Iron deficiency anemia secondary to blood loss (chronic): Secondary | ICD-10-CM | POA: Diagnosis not present

## 2018-08-31 DIAGNOSIS — E46 Unspecified protein-calorie malnutrition: Secondary | ICD-10-CM | POA: Diagnosis not present

## 2018-08-31 DIAGNOSIS — J449 Chronic obstructive pulmonary disease, unspecified: Secondary | ICD-10-CM | POA: Diagnosis not present

## 2018-08-31 DIAGNOSIS — F321 Major depressive disorder, single episode, moderate: Secondary | ICD-10-CM | POA: Diagnosis not present

## 2018-10-01 ENCOUNTER — Emergency Department (HOSPITAL_COMMUNITY): Payer: Medicare Other

## 2018-10-01 ENCOUNTER — Inpatient Hospital Stay (HOSPITAL_COMMUNITY)
Admission: EM | Admit: 2018-10-01 | Discharge: 2018-10-02 | DRG: 190 | Disposition: A | Discharge status: Home / Self Care | Payer: Medicare Other | Attending: Pulmonary Disease | Admitting: Pulmonary Disease

## 2018-10-01 ENCOUNTER — Encounter (HOSPITAL_COMMUNITY): Payer: Self-pay | Admitting: Emergency Medicine

## 2018-10-01 ENCOUNTER — Other Ambulatory Visit: Payer: Self-pay

## 2018-10-01 DIAGNOSIS — Z66 Do not resuscitate: Secondary | ICD-10-CM | POA: Diagnosis present

## 2018-10-01 DIAGNOSIS — Z515 Encounter for palliative care: Secondary | ICD-10-CM | POA: Diagnosis present

## 2018-10-01 DIAGNOSIS — Z681 Body mass index (BMI) 19 or less, adult: Secondary | ICD-10-CM

## 2018-10-01 DIAGNOSIS — Z79899 Other long term (current) drug therapy: Secondary | ICD-10-CM | POA: Diagnosis not present

## 2018-10-01 DIAGNOSIS — G8929 Other chronic pain: Secondary | ICD-10-CM | POA: Diagnosis present

## 2018-10-01 DIAGNOSIS — J9601 Acute respiratory failure with hypoxia: Secondary | ICD-10-CM | POA: Diagnosis present

## 2018-10-01 DIAGNOSIS — J449 Chronic obstructive pulmonary disease, unspecified: Secondary | ICD-10-CM | POA: Diagnosis present

## 2018-10-01 DIAGNOSIS — M25522 Pain in left elbow: Secondary | ICD-10-CM | POA: Diagnosis present

## 2018-10-01 DIAGNOSIS — I10 Essential (primary) hypertension: Secondary | ICD-10-CM | POA: Diagnosis present

## 2018-10-01 DIAGNOSIS — K219 Gastro-esophageal reflux disease without esophagitis: Secondary | ICD-10-CM | POA: Diagnosis present

## 2018-10-01 DIAGNOSIS — Z7952 Long term (current) use of systemic steroids: Secondary | ICD-10-CM

## 2018-10-01 DIAGNOSIS — E46 Unspecified protein-calorie malnutrition: Secondary | ICD-10-CM | POA: Diagnosis present

## 2018-10-01 DIAGNOSIS — J441 Chronic obstructive pulmonary disease with (acute) exacerbation: Secondary | ICD-10-CM | POA: Diagnosis not present

## 2018-10-01 DIAGNOSIS — Z86010 Personal history of colonic polyps: Secondary | ICD-10-CM | POA: Diagnosis not present

## 2018-10-01 DIAGNOSIS — F17210 Nicotine dependence, cigarettes, uncomplicated: Secondary | ICD-10-CM | POA: Diagnosis present

## 2018-10-01 DIAGNOSIS — J9621 Acute and chronic respiratory failure with hypoxia: Secondary | ICD-10-CM | POA: Diagnosis not present

## 2018-10-01 DIAGNOSIS — Z8249 Family history of ischemic heart disease and other diseases of the circulatory system: Secondary | ICD-10-CM

## 2018-10-01 DIAGNOSIS — J439 Emphysema, unspecified: Secondary | ICD-10-CM | POA: Diagnosis not present

## 2018-10-01 DIAGNOSIS — E440 Moderate protein-calorie malnutrition: Secondary | ICD-10-CM | POA: Diagnosis present

## 2018-10-01 DIAGNOSIS — D649 Anemia, unspecified: Secondary | ICD-10-CM | POA: Diagnosis present

## 2018-10-01 LAB — BASIC METABOLIC PANEL
Anion gap: 7 (ref 5–15)
BUN: 16 mg/dL (ref 8–23)
CO2: 26 mmol/L (ref 22–32)
Calcium: 8.8 mg/dL — ABNORMAL LOW (ref 8.9–10.3)
Chloride: 107 mmol/L (ref 98–111)
Creatinine, Ser: 0.87 mg/dL (ref 0.44–1.00)
GLUCOSE: 112 mg/dL — AB (ref 70–99)
Potassium: 4 mmol/L (ref 3.5–5.1)
SODIUM: 140 mmol/L (ref 135–145)

## 2018-10-01 LAB — CBC WITH DIFFERENTIAL/PLATELET
ABS IMMATURE GRANULOCYTES: 0.01 10*3/uL (ref 0.00–0.07)
Basophils Absolute: 0.1 10*3/uL (ref 0.0–0.1)
Basophils Relative: 1 %
Eosinophils Absolute: 0.8 10*3/uL — ABNORMAL HIGH (ref 0.0–0.5)
Eosinophils Relative: 10 %
HEMATOCRIT: 32 % — AB (ref 36.0–46.0)
HEMOGLOBIN: 9.1 g/dL — AB (ref 12.0–15.0)
Immature Granulocytes: 0 %
LYMPHS ABS: 3.4 10*3/uL (ref 0.7–4.0)
Lymphocytes Relative: 40 %
MCH: 22.1 pg — AB (ref 26.0–34.0)
MCHC: 28.4 g/dL — ABNORMAL LOW (ref 30.0–36.0)
MCV: 77.7 fL — AB (ref 80.0–100.0)
MONO ABS: 0.9 10*3/uL (ref 0.1–1.0)
MONOS PCT: 11 %
NEUTROS ABS: 3.2 10*3/uL (ref 1.7–7.7)
Neutrophils Relative %: 38 %
PLATELETS: 370 10*3/uL (ref 150–400)
RBC: 4.12 MIL/uL (ref 3.87–5.11)
RDW: 17.2 % — ABNORMAL HIGH (ref 11.5–15.5)
WBC: 8.4 10*3/uL (ref 4.0–10.5)
nRBC: 0 % (ref 0.0–0.2)

## 2018-10-01 LAB — HEPATIC FUNCTION PANEL
ALBUMIN: 3.9 g/dL (ref 3.5–5.0)
ALK PHOS: 97 U/L (ref 38–126)
ALT: 18 U/L (ref 0–44)
AST: 29 U/L (ref 15–41)
BILIRUBIN TOTAL: 0.5 mg/dL (ref 0.3–1.2)
Bilirubin, Direct: <0.1 mg/dL (ref 0.0–0.2)
Total Protein: 6.8 g/dL (ref 6.5–8.1)

## 2018-10-01 LAB — MAGNESIUM: Magnesium: 2.1 mg/dL (ref 1.7–2.4)

## 2018-10-01 LAB — PHOSPHORUS: Phosphorus: 4.6 mg/dL (ref 2.5–4.6)

## 2018-10-01 MED ORDER — METHYLPREDNISOLONE SODIUM SUCC 125 MG IJ SOLR
125.0000 mg | Freq: Once | INTRAMUSCULAR | Status: AC
  Administered 2018-10-01: 125 mg via INTRAVENOUS
  Filled 2018-10-01: qty 2

## 2018-10-01 MED ORDER — IPRATROPIUM-ALBUTEROL 0.5-2.5 (3) MG/3ML IN SOLN
3.0000 mL | Freq: Once | RESPIRATORY_TRACT | Status: AC
  Administered 2018-10-01: 3 mL via RESPIRATORY_TRACT
  Filled 2018-10-01: qty 3

## 2018-10-01 MED ORDER — ALBUTEROL SULFATE (2.5 MG/3ML) 0.083% IN NEBU: 2.5000 mg | INHALATION_SOLUTION | RESPIRATORY_TRACT | Status: DC | PRN

## 2018-10-01 MED ORDER — ACETAMINOPHEN 325 MG PO TABS
650.0000 mg | ORAL_TABLET | Freq: Four times a day (QID) | ORAL | Status: DC | PRN
  Administered 2018-10-01: 650 mg via ORAL
  Filled 2018-10-01: qty 2

## 2018-10-01 MED ORDER — MAGNESIUM SULFATE 2 GM/50ML IV SOLN
2.0000 g | Freq: Once | INTRAVENOUS | Status: AC
  Administered 2018-10-01: 2 g via INTRAVENOUS
  Filled 2018-10-01: qty 50

## 2018-10-01 MED ORDER — HEPARIN SODIUM (PORCINE) 5000 UNIT/ML IJ SOLN
5000.0000 [IU] | Freq: Three times a day (TID) | INTRAMUSCULAR | Status: DC
  Administered 2018-10-01 (×2): 5000 [IU] via SUBCUTANEOUS
  Filled 2018-10-01 (×3): qty 1

## 2018-10-01 MED ORDER — ALBUTEROL (5 MG/ML) CONTINUOUS INHALATION SOLN
INHALATION_SOLUTION | RESPIRATORY_TRACT | Status: AC
  Administered 2018-10-01: 15 mg
  Filled 2018-10-01: qty 20

## 2018-10-01 MED ORDER — ONDANSETRON HCL 4 MG/2ML IJ SOLN: 4.0000 mg | Freq: Four times a day (QID) | INTRAMUSCULAR | Status: DC | PRN

## 2018-10-01 MED ORDER — METHYLPREDNISOLONE SODIUM SUCC 40 MG IJ SOLR
40.0000 mg | Freq: Four times a day (QID) | INTRAMUSCULAR | Status: AC
  Administered 2018-10-01 – 2018-10-02 (×3): 40 mg via INTRAVENOUS
  Filled 2018-10-01 (×3): qty 1

## 2018-10-01 MED ORDER — ALBUTEROL (5 MG/ML) CONTINUOUS INHALATION SOLN
INHALATION_SOLUTION | RESPIRATORY_TRACT | Status: AC
  Administered 2018-10-01: 10 mg
  Filled 2018-10-01: qty 20

## 2018-10-01 MED ORDER — ONDANSETRON HCL 4 MG PO TABS: 4.0000 mg | ORAL_TABLET | Freq: Four times a day (QID) | ORAL | Status: DC | PRN

## 2018-10-01 MED ORDER — ACETAMINOPHEN 650 MG RE SUPP: 650.0000 mg | Freq: Four times a day (QID) | RECTAL | Status: DC | PRN

## 2018-10-01 MED ORDER — PREDNISONE 20 MG PO TABS
40.0000 mg | ORAL_TABLET | Freq: Every day | ORAL | Status: DC
  Administered 2018-10-02: 40 mg via ORAL
  Filled 2018-10-01: qty 2

## 2018-10-01 MED ORDER — IPRATROPIUM BROMIDE 0.02 % IN SOLN
RESPIRATORY_TRACT | Status: AC
  Administered 2018-10-01: 1 mg via RESPIRATORY_TRACT
  Filled 2018-10-01: qty 5

## 2018-10-01 MED ORDER — IPRATROPIUM-ALBUTEROL 0.5-2.5 (3) MG/3ML IN SOLN
3.0000 mL | Freq: Four times a day (QID) | RESPIRATORY_TRACT | Status: DC
  Administered 2018-10-01 – 2018-10-02 (×3): 3 mL via RESPIRATORY_TRACT
  Filled 2018-10-01 (×3): qty 3

## 2018-10-01 MED ORDER — ROFLUMILAST 500 MCG PO TABS
250.0000 ug | ORAL_TABLET | Freq: Every day | ORAL | Status: DC
  Administered 2018-10-01 – 2018-10-02 (×2): 250 ug via ORAL
  Filled 2018-10-01 (×2): qty 1

## 2018-10-01 MED ORDER — IPRATROPIUM BROMIDE 0.02 % IN SOLN
0.5000 mg | Freq: Four times a day (QID) | RESPIRATORY_TRACT | Status: DC
  Administered 2018-10-01: 0.5 mg via RESPIRATORY_TRACT
  Administered 2018-10-01: 1 mg via RESPIRATORY_TRACT
  Filled 2018-10-01: qty 2.5

## 2018-10-01 MED ORDER — ALBUTEROL SULFATE (2.5 MG/3ML) 0.083% IN NEBU
2.5000 mg | INHALATION_SOLUTION | Freq: Four times a day (QID) | RESPIRATORY_TRACT | Status: DC
  Administered 2018-10-01: 2.5 mg via RESPIRATORY_TRACT
  Filled 2018-10-01: qty 3

## 2018-10-01 NOTE — Progress Notes (Signed)
Patient taken off BIPAP to room air. Patients work of breathing is much better. Patient started on another CAT per MD.

## 2018-10-01 NOTE — ED Provider Notes (Signed)
[facility] EMERGENCY DEPARTMENT Provider Note   CSN: [id number] Arrival date & time: 10/01/18  0017     History   Chief Complaint Chief Complaint  Patient presents with  . Shortness of Breath   Level 5 caveat due to acuity of condition HPI Katelyn [name] is a 77 y.o. female.  The history is provided by the patient. The history is limited by the condition of the patient.  Shortness of Breath  This is a new problem. The problem occurs continuously.The problem has been rapidly worsening. Associated symptoms include cough and wheezing.   History of COPD presents with shortness of breath.  She reported ongoing for several hours.  It did not improve at home with albuterol treatments.  No other details known at this time. Past Medical History:  Diagnosis Date  . Asthma   . Chronic neck pain   . Chronic pain of left elbow   . Complication of anesthesia    hard to wake  . COPD (chronic obstructive pulmonary disease) ([place])   . Dysphagia    requiring multiple dilations in past  . Headache    Patient states she needs  eyeglasses  . Irregular heart beat   . S/P colonoscopy 2009   dr. Gala Romney: anal papilla, 2 cecal AVMs. Due in 2014  . S/P endoscopy 2012   Dr. Gala Romney: mild chronic gastritis, bulbar erosions, s/p 54-French Maloney dilation   . S/P endoscopy 2003, 2004, 2007   gastritis, duodenitis, s/p dilation, +H.pylori     Patient Active Problem List   Diagnosis Date Noted  . IDA (iron deficiency anemia) 07/04/2018  . Acute metabolic encephalopathy 51/70/0174  . Acute respiratory failure with hypoxia and hypercapnia ([place]) 07/02/2018  . GERD (gastroesophageal reflux disease) 06/13/2018  . Malnutrition ([place]) 06/13/2018  . Essential hypertension 06/13/2018  . Tachycardia 04/14/2018  . Acute gastritis 03/31/2018  . Esophagitis 03/31/2018  . Duodenal ulcer 03/31/2018  . Goals of care, counseling/discussion   . DNR (do not resuscitate) discussion   . Palliative care by  specialist   . COPD with acute exacerbation ([place]) 03/29/2018  . COPD (chronic obstructive pulmonary disease) ([place]) 02/03/2018  . Hypoxia 11/28/2015  . COPD exacerbation ([place]) 11/28/2015  . Acute respiratory failure with hypoxia ([place]) 11/28/2015  . SOB (shortness of breath) 11/28/2015  . Mucosal abnormality of stomach   . History of colonic polyps 04/25/2015  . 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 Dysphagia 03/11/2011    Past Surgical History:  Procedure Laterality Date  . APPENDECTOMY    . BIOPSY  03/30/2018   Procedure: BIOPSY;  Surgeon: Daneil Dolin, MD;  Location: [facility] END[facility] SU[facility];  Service: Gastroenterology;;  gastric  . CHOLECYSTECTOMY    . COLONOSCOPY  10/10/2008   RMR: 1. Anal papilla, otherwise normal rectum. 2. Two cecal arteriovenous malformations, colonic mucosa apperaed normal.   . COLONOSCOPY N/A 05/10/2015   RMR: Rectal and colonic polyps removed as described above. Ascending colon AVMs . Rectal polyps were tubular adenomas. Next colonoscopy May 2021.  . ESOPHAGEAL DILATION N/A 05/10/2015   Procedure: ESOPHAGEAL DILATION;  Surgeon: Daneil Dolin, MD;  Location: [facility] SU[facility];  Service: Endoscopy;  Laterality: N/A;  . ESOPHAGOGASTRODUODENOSCOPY  04/02/2011   RMR: 1. Endoscopically normal -appearing esophagus status post passage of a Maloney dilator followed by biopsy. 2. Hiatal hernia, Mottling, and submucosal petechiae in teh gastric mucoas of uncertain significance status post biopsy. 3. Bulbar erosions as described above.   . ESOPHAGOGASTRODUODENOSCOPY N/A 05/10/2015   RMR: Normal  esophagus status post [facility] dilation. Small hiatal hernia. Abnormal gastric because of doubtful clinical significance status post biopsy (benign)  . ESOPHAGOGASTRODUODENOSCOPY (EGD) WITH PROPOFOL N/A 03/30/2018   Procedure: ESOPHAGOGASTRODUODENOSCOPY (EGD) WITH PROPOFOL;  Surgeon: Daneil Dolin, MD;  Location: [facility];  Service: Gastroenterology;  Laterality: N/A;  WITH DILATION   . INGUINAL  HERNIA REPAIR    . MALONEY DILATION  03/30/2018   Procedure: Venia Minks DILATION;  Surgeon: Daneil Dolin, MD;  Location: [facility];  Service: Gastroenterology;;  . TONSILLECTOMY    . TOTAL ABDOMINAL HYSTERECTOMY       OB History    Gravida  5   Para  5   Term  4   Preterm  1   AB      Living  4     SAB      TAB      Ectopic      Multiple      Live Births               Home Medications    Prior to Admission medications   Medication Sig Start Date End Date Taking? Authorizing Provider  albuterol (PROVENTIL HFA;VENTOLIN HFA) 108 (90 BASE) MCG/ACT inhaler Inhale 2 puffs into the lungs every 6 (six) hours as needed for wheezing or shortness of breath. 09/04/15   [name], MD  albuterol (PROVENTIL) (2.5 MG/3ML) 0.083% nebulizer solution Take 2.5 mg by nebulization every 4 (four) hours as needed for shortness of breath.    [provider]  Multiple Vitamins-Minerals (MULTIVITAMIN ADULT) CHEW Chew 2 each by mouth daily. Vitafusion gummy    [provider]  predniSONE (STERAPRED UNI-PAK 21 TAB) 10 MG (21) TBPK tablet Take by package instructions 07/05/18   Sinda Du, MD  roflumilast (DALIRESP) 500 MCG TABS tablet Take 0.5 tablets (250 mcg total) by mouth daily. 07/05/18   Sinda Du, MD  Tiotropium Bromide-Olodaterol (STIOLTO RESPIMAT) 2.5-2.5 MCG/ACT AERS Inhale 2 Inhalers into the lungs daily.     [provider]    Family History Family History  Problem Relation Age of Onset  . Heart disease Mother   . Lung cancer Father   . Cancer Sister   . Diabetes Brother   . Colon cancer Neg Hx     Social History Social History   Tobacco Use  . Smoking status: Current Every Day Smoker    Packs/day: 1.00    Years: 57.00    Pack years: 57.00    Types: Cigarettes    Start date: 08/18/1959  . Smokeless tobacco: Never Used  Substance Use Topics  . Alcohol use: No    Alcohol/week: 0.0 standard drinks  . Drug use: No      Allergies   Patient has no known allergies.   Review of Systems Review of Systems  Unable to perform ROS: Acuity of condition  Respiratory: Positive for cough, shortness of breath and wheezing.      Physical Exam Updated Vital Signs BP 133/72   Pulse (!) 105   Temp (!) 97.4 F (36.3 C) (Oral)   Resp (!) 24   Ht 1.626 m (5\' 4" )   Wt 45.4 kg   SpO2 93%   BMI 17.16 kg/m   Physical Exam CONSTITUTIONAL: Elderly, ill-appearing HEAD: Normocephalic/atraumatic EYES: EOMI/PERRL ENMT: Mucous membranes moist NECK: supple no meningeal signs SPINE/BACK:entire spine nontender CV: S1/S2 noted, tachycardic LUNGS: Tachypneic and wheezing bilaterally ABDOMEN: soft, nontender, no rebound or guarding, bowel sounds noted throughout abdomen  GU:no cva tenderness NEURO: Pt is awake/alert/appropriate, moves all extremitiesx4.  No facial droop.   EXTREMITIES: pulses normal/equal, full ROM, no lower extremity edema SKIN: warm, color normal PSYCH: anxious  ED Treatments / Results  Labs (all labs ordered are listed, but only abnormal results are displayed) Labs Reviewed  BASIC METABOLIC PANEL - Abnormal; Notable for the following components:      Result Value   Glucose, Bld 112 (*)    Calcium 8.8 (*)    All other components within normal limits  CBC WITH DIFFERENTIAL/PLATELET - Abnormal; Notable for the following components:   Hemoglobin 9.1 (*)    HCT 32.0 (*)    MCV 77.7 (*)    MCH 22.1 (*)    MCHC 28.4 (*)    RDW 17.2 (*)    Eosinophils Absolute 0.8 (*)    All other components within normal limits    EKG EKG Interpretation  Date/Time:  Thursday October 01 2018 00:25:36 EDT Ventricular Rate:  110 PR Interval:    QRS Duration: 90 QT Interval:  318 QTC Calculation: 431 R Axis:   44 Text Interpretation:  Sinus or ectopic atrial tachycardia Left atrial enlargement RSR' in V1 or V2, right VCD or RVH Interpretation limited secondary to artifact Confirmed by Ripley Fraise [phone]) on 10/01/2018 12:33:43 AM   Radiology Dg Chest Portable 1 View  Result Date: 10/01/2018 CLINICAL DATA:  Patient states SOB and chest tightness x a few hours. Hx of COPD, irregular heartbeat, asthma, and current smoker of 1 pack a day x 57 years. EXAM: PORTABLE CHEST 1 VIEW COMPARISON:  07/02/2018 FINDINGS: Tapering of the peripheral pulmonary vasculature favors emphysema. Atherosclerotic calcification of the aortic arch. Previous airway thickening has improved. The lungs appear otherwise clear.  No pleural effusion. IMPRESSION: 1. Aortic Atherosclerosis (ICD10-I70.0) and Emphysema (ICD10-J43.9). Electronically Signed   By: Van Clines M.D.   On: 10/01/2018 01:17    Procedures Procedures  CRITICAL CARE Performed by: Sharyon Cable Total critical care time: 35 minutes Critical care time was exclusive of separately billable procedures and treating other patients. Critical care was necessary to treat or prevent imminent or life-threatening deterioration. Critical care was time spent personally by me on the following activities: development of treatment plan with patient and/or surrogate as well as nursing, discussions with consultants, evaluation of patient's response to treatment, examination of patient, obtaining history from patient or surrogate, ordering and performing treatments and interventions, ordering and review of laboratory studies, ordering and review of radiographic studies, pulse oximetry and re-evaluation of patient's condition.   Medications Ordered in ED Medications  ipratropium-albuterol (DUONEB) 0.5-2.5 (3) MG/3ML nebulizer solution 3 mL (has no administration in time range)  ipratropium (ATROVENT) 0.02 % nebulizer solution (1 mg  Given 10/01/18 0038)  albuterol (PROVENTIL, VENTOLIN) (5 MG/ML) 0.5% continuous inhalation solution (15 mg  Given 10/01/18 0038)  methylPREDNISolone sodium succinate (SOLU-MEDROL) 125 mg/2 mL injection 125 mg (125 mg  Intravenous Given 10/01/18 0043)  albuterol (PROVENTIL, VENTOLIN) (5 MG/ML) 0.5% continuous inhalation solution (10 mg  Given 10/01/18 0147)     Initial Impression / Assessment and Plan / ED Course  I have reviewed the triage vital signs and the nursing notes.  Pertinent labs & imaging results that were available during my care of the patient were reviewed by me and considered in my medical decision making (see chart for details).     12:37 AM Seen on arrival for shortness of breath.  She is in clear respiratory distress.  She is been placed on nebulizers as well as noninvasive ventilation.  Will follow closely 3:22 AM Patient improved enough to be taken off of noninvasive ventilation.  However she had mild tachypnea as well as wheezing, therefore further nebs were ordered. I feel she should be admitted.  Labs near baseline.  Chest x-ray reviewed and is negative. She denies any other complaints, reports only shortness of breath.  Discussed With Dr. Olevia Bowens for admission Final Clinical Impressions(s) / ED Diagnoses   Final diagnoses:  COPD exacerbation ([place])  Acute respiratory failure with hypoxia [facility])    ED Discharge Orders    None       Ripley Fraise, MD 10/01/18 [phone]

## 2018-10-01 NOTE — Evaluation (Signed)
Physical Therapy Evaluation Patient Details Name: Katelyn [name] MRN: [id number] DOB: 17-Dec-1941 Today's Date: 10/01/2018   History of Present Illness  Katelyn [name] is a 77 y.o. female with medical history significant of asthma/COPD, chronic neck pain, chronic left elbow pain, chronic gastritis, history of duodenitis, dysphagia, history of headache, history of irregular heartbeat who is coming to the emergency department with complaints of progressively worse dyspnea associated with chest tightness, productive cough of whitish sputum, fatigue and wheezing for 2 days, which the patient had 3 views to the colder weather, despite using inhalers and neb treatments at home.  She denies travel history or sick contacts.  No fever, chills, sore throat, but complains of mild rhinorrhea.  Denies hemoptysis, palpitations, dizziness, diaphoresis, PND, orthopnea or pitting edema of the lower extremities.  Denies abdominal pain, nausea, emesis, diarrhea, constipation, melena or hematochezia.  No dysuria, frequency or hematuria.  No heat or cold intolerance.  No polyuria, polydipsia, polyphagia or blurred vision.  Denies skin pruritus.    Clinical Impression  Patient functioning at baseline for functional mobility and gait. Plan:  Patient discharged from physical therapy to care of nursing for ambulation daily as tolerated for length of stay.    Follow Up Recommendations No PT follow up    Equipment Recommendations  None recommended by PT    Recommendations for Other Services       Precautions / Restrictions Precautions Precautions: None Restrictions Weight Bearing Restrictions: No      Mobility  Bed Mobility Overal bed mobility: Independent                Transfers Overall transfer level: Independent                  Ambulation/Gait Ambulation/Gait assistance: Independent Gait Distance (Feet): 150 Feet Assistive device: None Gait Pattern/deviations: WFL(Within Functional  Limits) Gait velocity: normal   General Gait Details: good return for ambulation on level, inclined, and declined surfaces without loss of balance  Stairs Stairs: Yes Stairs assistance: Modified independent (Device/Increase time) Stair Management: One rail Right;One rail Left;Alternating pattern Number of Stairs: 9 General stair comments: demonstrates good return for going up/down 9 steps using 1 siderail without loss of balance  Wheelchair Mobility    Modified Rankin (Stroke Patients Only)       Balance Overall balance assessment: No apparent balance deficits (not formally assessed)                                           Pertinent Vitals/Pain Pain Assessment: No/denies pain    Home Living Family/patient expects to be discharged to:: Private residence Living Arrangements: Children(son and grandson) Available Help at Discharge: Family Type of Home: Mobile home Home Access: Stairs to enter Entrance Stairs-Rails: Right;Left;Can reach both Technical brewer of Steps: 2 Home Layout: One level Home Equipment: None      Prior Function Level of Independence: Independent         Comments: community ambulator, does not drive     Hand Dominance        Extremity/Trunk Assessment   Upper Extremity Assessment Upper Extremity Assessment: Overall WFL for tasks assessed    Lower Extremity Assessment Lower Extremity Assessment: Overall WFL for tasks assessed    Cervical / Trunk Assessment Cervical / Trunk Assessment: Normal  Communication   Communication: No difficulties  Cognition Arousal/Alertness: Awake/alert Behavior During Therapy:  WFL for tasks assessed/performed Overall Cognitive Status: Within Functional Limits for tasks assessed                                        General Comments      Exercises     Assessment/Plan    PT Assessment Patent does not need any further PT services  PT Problem List          PT Treatment Interventions      PT Goals (Current goals can be found in the Care Plan section)  Acute Rehab PT Goals Patient Stated Goal: return home PT Goal Formulation: With patient Time For Goal Achievement: 10/01/18 Potential to Achieve Goals: Good    Frequency     Barriers to discharge        Co-evaluation               AM-PAC PT "6 Clicks" Daily Activity  Outcome Measure Difficulty turning over in bed (including adjusting bedclothes, sheets and blankets)?: None Difficulty moving from lying on back to sitting on the side of the bed? : None Difficulty sitting down on and standing up from a chair with arms (e.g., wheelchair, bedside commode, etc,.)?: None Help needed moving to and from a bed to chair (including a wheelchair)?: None Help needed walking in hospital room?: None Help needed climbing 3-5 steps with a railing? : None 6 Click Score: 24    End of Session   Activity Tolerance: Patient tolerated treatment well Patient left: in bed;with call bell/phone within reach Nurse Communication: Mobility status PT Visit Diagnosis: Unsteadiness on feet (R26.81);Other abnormalities of gait and mobility (R26.89);Muscle weakness (generalized) (M62.81)    Time: 3419-3790 PT Time Calculation (min) (ACUTE ONLY): 18 min   Charges:   PT Evaluation $PT Eval Low Complexity: 1 Low PT Treatments $Gait Training: 8-22 mins        3:45 PM, 10/01/18 Lonell Grandchild, MPT Physical Therapist with [facility] [phone] office [phone] mobile phone

## 2018-10-01 NOTE — H&P (Signed)
History and Physical    Katelyn [name] QIW:[id number] DOB: August 01, 1941 DOA: 10/01/2018  PCP: Sinda Du, MD   Patient coming from: Home.  I have personally briefly reviewed patient's old medical records in [facility]  Chief Complaint: Shortness of breath and chest tightness.  HPI: Katelyn [name] is a 77 y.o. female with medical history significant of asthma/COPD, chronic neck pain, chronic left elbow pain, chronic gastritis, history of duodenitis, dysphagia, history of headache, history of irregular heartbeat who is coming to the emergency department with complaints of progressively worse dyspnea associated with chest tightness, productive cough of whitish sputum, fatigue and wheezing for 2 days, which the patient had 3 views to the colder weather, despite using inhalers and neb treatments at home.  She denies travel history or sick contacts.  No fever, chills, sore throat, but complains of mild rhinorrhea.  Denies hemoptysis, palpitations, dizziness, diaphoresis, PND, orthopnea or pitting edema of the lower extremities.  Denies abdominal pain, nausea, emesis, diarrhea, constipation, melena or hematochezia.  No dysuria, frequency or hematuria.  No heat or cold intolerance.  No polyuria, polydipsia, polyphagia or blurred vision.  Denies skin pruritus.  ED Course: Initial vital signs temperature 97.4 F, pulse 104, respirations 22, blood pressure 154 110 mmHg and O2 sat 94% on room air.  The patient received supplemental oxygen, a DuoNeb, Solu-Medrol 125 mg IVP x1 and was placed on BiPAP ventilation.   White count is 8.4, hemoglobin 9.1 g/dL and platelets 370.  BMP showed a glucose of 112 and calcium of 8.8 mg/dL.  All other values are within normal limits.  LFTs were normal.  Magnesium and phosphorus were normal.  Review of Systems: As per HPI otherwise 10 point review of systems negative.   Past Medical History:  Diagnosis Date  . Asthma   . Chronic neck pain   . Chronic pain of left  elbow   . Complication of anesthesia    hard to wake  . COPD (chronic obstructive pulmonary disease) ([place])   . Dysphagia    requiring multiple dilations in past  . Headache    Patient states she needs  eyeglasses  . Irregular heart beat   . S/P colonoscopy 2009   dr. Gala Romney: anal papilla, 2 cecal AVMs. Due in 2014  . S/P endoscopy 2012   Dr. Gala Romney: mild chronic gastritis, bulbar erosions, s/p 54-French Maloney dilation   . S/P endoscopy 2003, 2004, 2007   gastritis, duodenitis, s/p dilation, +H.pylori     Past Surgical History:  Procedure Laterality Date  . APPENDECTOMY    . BIOPSY  03/30/2018   Procedure: BIOPSY;  Surgeon: Daneil Dolin, MD;  Location: [facility];  Service: Gastroenterology;;  gastric  . CHOLECYSTECTOMY    . COLONOSCOPY  10/10/2008   RMR: 1. Anal papilla, otherwise normal rectum. 2. Two cecal arteriovenous malformations, colonic mucosa apperaed normal.   . COLONOSCOPY N/A 05/10/2015   RMR: Rectal and colonic polyps removed as described above. Ascending colon AVMs . Rectal polyps were tubular adenomas. Next colonoscopy May 2021.  . ESOPHAGEAL DILATION N/A 05/10/2015   Procedure: ESOPHAGEAL DILATION;  Surgeon: Daneil Dolin, MD;  Location: [facility];  Service: Endoscopy;  Laterality: N/A;  . ESOPHAGOGASTRODUODENOSCOPY  04/02/2011   RMR: 1. Endoscopically normal -appearing esophagus status post passage of a Maloney dilator followed by biopsy. 2. Hiatal hernia, Mottling, and submucosal petechiae in teh gastric mucoas of uncertain significance status post biopsy. 3. Bulbar erosions as described above.   Marland Kitchen  ESOPHAGOGASTRODUODENOSCOPY N/A 05/10/2015   RMR: Normal esophagus status post Maloney dilation. Small hiatal hernia. Abnormal gastric because of doubtful clinical significance status post biopsy (benign)  . ESOPHAGOGASTRODUODENOSCOPY (EGD) WITH PROPOFOL N/A 03/30/2018   Procedure: ESOPHAGOGASTRODUODENOSCOPY (EGD) WITH PROPOFOL;  Surgeon: Daneil Dolin, MD;   Location[facility];  Service: Gastroenterology;  Laterality: N/A;  WITH DILATION   . INGUINAL HERNIA REPAIR    . MALONEY DILATION  03/30/2018   Procedure: Venia Minks DILATION;  Surgeon: Daneil Dolin, MD;  Location: [facility];  Service: Gastroenterology;;  . TONSILLECTOMY    . TOTAL ABDOMINAL HYSTERECTOMY       reports that she has been smoking cigarettes. She started smoking about 59 years ago. She has a 57.00 pack-year smoking history. She has never used smokeless tobacco. She reports that she does not drink alcohol or use drugs.  No Known Allergies  Family History  Problem Relation Age of Onset  . Heart disease Mother   . Lung cancer Father   . Cancer Sister   . Diabetes Brother   . Colon cancer Neg Hx    Prior to Admission medications   Medication Sig Start Date End Date Taking? Authorizing Provider  albuterol (PROVENTIL HFA;VENTOLIN HFA) 108 (90 BASE) MCG/ACT inhaler Inhale 2 puffs into the lungs every 6 (six) hours as needed for wheezing or shortness of breath. 09/04/15   [name], MD  albuterol (PROVENTIL) (2.5 MG/3ML) 0.083% nebulizer solution Take 2.5 mg by nebulization every 4 (four) hours as needed for shortness of breath.    [provider]  Multiple Vitamins-Minerals (MULTIVITAMIN ADULT) CHEW Chew 2 each by mouth daily. Vitafusion gummy    [provider]  predniSONE (STERAPRED UNI-PAK 21 TAB) 10 MG (21) TBPK tablet Take by package instructions 07/05/18   Sinda Du, MD  roflumilast (DALIRESP) 500 MCG TABS tablet Take 0.5 tablets (250 mcg total) by mouth daily. 07/05/18   Sinda Du, MD  Tiotropium Bromide-Olodaterol (STIOLTO RESPIMAT) 2.5-2.5 MCG/ACT AERS Inhale 2 Inhalers into the lungs daily.     [provider]    Physical Exam: Vitals:   10/01/18 0147 10/01/18 0200 10/01/18 0230 10/01/18 0300  BP:  119/70 116/68 (!) 101/45  Pulse:  79 98 91  Resp:  (!) 36 (!) 29 17  Temp:      TempSrc:      SpO2: 98% 98% 95% 91%    Weight:      Height:        Constitutional: NAD, calm, comfortable Eyes: PERRL, lids and conjunctivae normal ENMT: Mucous membranes are moist. Posterior pharynx clear of any exudate or lesions. Neck: normal, supple, no masses, no thyromegaly Respiratory: clear to auscultation bilaterally, no wheezing, no crackles. Normal respiratory effort. No accessory muscle use.  Cardiovascular: Regular rate and rhythm, no murmurs / rubs / gallops. No extremity edema. 2+ pedal pulses. No carotid bruits.  Abdomen: no tenderness, no masses palpated. No hepatosplenomegaly. Bowel sounds positive.  Musculoskeletal: no clubbing / cyanosis. No joint deformity upper and lower extremities. Good ROM, no contractures. Normal muscle tone.  Skin: no rashes, lesions, ulcers. No induration Neurologic: CN 2-12 grossly intact. Sensation intact, DTR normal. Strength 5/5 in all 4.  Psychiatric: Normal judgment and insight. Alert and oriented x 3. Normal mood.   Labs on Admission: I have personally reviewed following labs and imaging studies  CBC: Recent Labs  Lab 10/01/18 0033  WBC 8.4  NEUTROABS 3.2  HGB 9.1*  HCT 32.0*  MCV 77.7*  PLT 707   Basic Metabolic Panel: Recent Labs  Lab 10/01/18 0033  NA 140  K 4.0  CL 107  CO2 26  GLUCOSE 112*  BUN 16  CREATININE 0.87  CALCIUM 8.8*   GFR: Estimated Creatinine Clearance: 40 mL/min (by C-G formula based on SCr of 0.87 mg/dL). Liver Function Tests: No results for input(s): AST, ALT, ALKPHOS, BILITOT, PROT, ALBUMIN in the last 168 hours. No results for input(s): LIPASE, AMYLASE in the last 168 hours. No results for input(s): AMMONIA in the last 168 hours. Coagulation Profile: No results for input(s): INR, PROTIME in the last 168 hours. Cardiac Enzymes: No results for input(s): CKTOTAL, CKMB, CKMBINDEX, TROPONINI in the last 168 hours. BNP (last 3 results) No results for input(s): PROBNP in the last 8760 hours. HbA1C: No results for input(s): HGBA1C  in the last 72 hours. CBG: No results for input(s): GLUCAP in the last 168 hours. Lipid Profile: No results for input(s): CHOL, HDL, LDLCALC, TRIG, CHOLHDL, LDLDIRECT in the last 72 hours. Thyroid Function Tests: No results for input(s): TSH, T4TOTAL, FREET4, T3FREE, THYROIDAB in the last 72 hours. Anemia Panel: No results for input(s): VITAMINB12, FOLATE, FERRITIN, TIBC, IRON, RETICCTPCT in the last 72 hours. Urine analysis: No results found for: COLORURINE, APPEARANCEUR, LABSPEC, PHURINE, GLUCOSEU, HGBUR, BILIRUBINUR, KETONESUR, PROTEINUR, UROBILINOGEN, NITRITE, LEUKOCYTESUR  Radiological Exams on Admission: Dg Chest Portable 1 View  Result Date: 10/01/2018 CLINICAL DATA:  Patient states SOB and chest tightness x a few hours. Hx of COPD, irregular heartbeat, asthma, and current smoker of 1 pack a day x 57 years. EXAM: PORTABLE CHEST 1 VIEW COMPARISON:  07/02/2018 FINDINGS: Tapering of the peripheral pulmonary vasculature favors emphysema. Atherosclerotic calcification of the aortic arch. Previous airway thickening has improved. The lungs appear otherwise clear.  No pleural effusion. IMPRESSION: 1. Aortic Atherosclerosis (ICD10-I70.0) and Emphysema (ICD10-J43.9). Electronically Signed   By: Van Clines M.D.   On: 10/01/2018 01:17   02/05/2018 echocardiogram with low image enhancing agent  ------------------------------------------------------------------- LV EF: 65% -   70%  ------------------------------------------------------------------- Indications:      Chest pain 786.51.  ------------------------------------------------------------------- History:   PMH:  Acquired from the patient and from the patient&'s chart.  Chronic obstructive pulmonary disease.  PMH:  Acute respiratory failure with hypoxia  ------------------------------------------------------------------- Study Conclusions  - Left ventricle: The cavity size was normal. Wall thickness was   normal.  Systolic function was vigorous. The estimated ejection   fraction was in the range of 65% to 70%. Wall motion was normal;   there were no regional wall motion abnormalities. The study is   not technically sufficient to allow evaluation of LV diastolic   function. 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 Mean gradient (S): 21   mm Hg. Valve area (VTI): 1.15 cm^2. Valve area (Vmax): 1.53 cm^2. - Mitral valve: Mildly to moderately calcified annulus. There is a   moderate gradient across the mitral valve. Mean gradient 8 mmHg   in the setting of tachycardia with heart rate 105. Visually there   looks to be mild mitral stenosis. Mean gradient (D): 8 mm Hg.   Valve area by continuity equation (using LVOT flow): 2.04 cm^2. - Left atrium: The atrium was severely dilated. - Right atrium: The atrium was moderately dilated. - Atrial septum: No defect or patent foramen ovale was identified. - Pulmonary arteries: Systolic pressure was moderately increased.   PA peak pressure: 46 mm Hg (S). - Technically difficult study.  EKG: Independently reviewed. Vent. rate 110 BPM PR  interval * ms QRS duration 90 ms QT/QTc 318/431 ms P-R-T axes 137 44 82 Sinus or ectopic atrial tachycardia Left atrial enlargement RSR' in V1 or V2, right VCD or RVH  Assessment/Plan Principal Problem:   COPD exacerbation (HCC)4      [id number] Admit to telemetry/observation.   Continue supplemental oxygen. Continue bronchodilators as needed. Continue solumedrol 40 mg IVP every 6 hours 1 day. Prednisone 40 mg p.o. daily starting tomorrow.  Active Problems:   GERD (gastroesophageal reflux disease) Famotidine 20 mg p.o. twice daily.    Malnutrition ([place]) Nutritional services evaluation. Ensure 1 can p.o. 3 times daily.    Essential hypertension Not on medical therapy. Monitor blood pressure.    Anemia Anemia profile in July this year showed low ferritin/iron and high TIBC. She is not currently taking iron  supplementation.     DVT prophylaxis: Heparin SQ. Code Status: Full code. Family Communication:  Disposition Plan: Observation for 24 to 48 hours for COPD exacerbation treatment. Consults called:  Admission status: Observation/telemetry.   Reubin Milan MD [facility] Pager [phone].  If 7PM-7AM, please contact night-coverage [email or web address] Password [email or web address]  10/01/2018, 3:33 AM

## 2018-10-01 NOTE — ED Triage Notes (Signed)
Pt c/o SOB and chest tightness x a few hours, hx of COPD, not on O2 at home, pt winded with audible wheezes, respiratory called

## 2018-10-01 NOTE — Progress Notes (Signed)
Assumption of care note: This is a 77 year old who has severe COPD and who came to the emergency room because of increased shortness of breath.  She relates this to change in the weather.  She is been using her nebulizer at home but continued to have shortness of breath and eventually came to the emergency department.  She says she feels better now.  She did require BiPAP briefly but now is on nasal cannula.  Exam: She looks comfortable.  Her chest is pretty clear with decreased breath sounds.  Heart is regular  She has COPD exacerbation.  Plan continue current treatments she is improving already

## 2018-10-02 DIAGNOSIS — J9601 Acute respiratory failure with hypoxia: Secondary | ICD-10-CM | POA: Diagnosis not present

## 2018-10-02 DIAGNOSIS — Z66 Do not resuscitate: Secondary | ICD-10-CM | POA: Diagnosis not present

## 2018-10-02 DIAGNOSIS — J9621 Acute and chronic respiratory failure with hypoxia: Secondary | ICD-10-CM | POA: Diagnosis not present

## 2018-10-02 DIAGNOSIS — E440 Moderate protein-calorie malnutrition: Secondary | ICD-10-CM | POA: Diagnosis not present

## 2018-10-02 DIAGNOSIS — Z681 Body mass index (BMI) 19 or less, adult: Secondary | ICD-10-CM | POA: Diagnosis not present

## 2018-10-02 DIAGNOSIS — J441 Chronic obstructive pulmonary disease with (acute) exacerbation: Secondary | ICD-10-CM | POA: Diagnosis not present

## 2018-10-02 DIAGNOSIS — Z515 Encounter for palliative care: Secondary | ICD-10-CM | POA: Diagnosis not present

## 2018-10-02 MED ORDER — AZITHROMYCIN 250 MG PO TABS: ORAL_TABLET | ORAL | 0 refills | Status: DC

## 2018-10-02 MED ORDER — PREDNISONE 10 MG (21) PO TBPK: ORAL_TABLET | ORAL | 2 refills | Status: DC

## 2018-10-02 NOTE — Progress Notes (Signed)
Subjective: She says she feels much better and wants to go home.  Objective: Vital signs in last 24 hours: Temp:  [98.1 F (36.7 C)-98.7 F (37.1 C)] 98.1 F (36.7 C) (10/11 0557) Pulse Rate:  [92-118] 92 (10/11 0557) Resp:  [18-20] 20 (10/10 1931) BP: (108-144)/(74-77) 108/77 (10/11 0557) SpO2:  [95 %-98 %] 98 % (10/11 0802) Weight change: -0.76 kg Last BM Date: 10/01/18  Intake/Output from previous day: 10/10 0701 - 10/11 0700 In: 960 [P.O.:960] Out: -   PHYSICAL EXAM General appearance: alert, cooperative and no distress Resp: clear to auscultation bilaterally Cardio: regular rate and rhythm, S1, S2 normal, no murmur, click, rub or gallop GI: soft, non-tender; bowel sounds normal; no masses,  no organomegaly Extremities: extremities normal, atraumatic, no cyanosis or edema  Lab Results:  Results for orders placed or performed during the hospital encounter of 10/01/18 (from the past 48 hour(s))  Basic metabolic panel     Status: Abnormal   Collection Time: 10/01/18 12:33 AM  Result Value Ref Range   Sodium 140 135 - 145 mmol/L   Potassium 4.0 3.5 - 5.1 mmol/L   Chloride 107 98 - 111 mmol/L   CO2 26 22 - 32 mmol/L   Glucose, Bld 112 (H) 70 - 99 mg/dL   BUN 16 8 - 23 mg/dL   Creatinine, Ser 0.87 0.44 - 1.00 mg/dL   Calcium 8.8 (L) 8.9 - 10.3 mg/dL   GFR calc non Af Amer >60 >60 mL/min   GFR calc Af Amer >60 >60 mL/min    Comment: (NOTE) The eGFR has been calculated using the CKD EPI equation. This calculation has not been validated in all clinical situations. eGFR's persistently <60 mL/min signify possible Chronic Kidney Disease.    Anion gap 7 5 - 15    Comment: Performed at [facility], [address] [place], [place] [address]  CBC with Differential/Platelet     Status: Abnormal   Collection Time: 10/01/18 12:33 AM  Result Value Ref Range   WBC 8.4 4.0 - 10.5 K/uL   RBC 4.12 3.87 - 5.11 MIL/uL   Hemoglobin 9.1 (L) 12.0 - 15.0 g/dL   HCT 32.0 (L) 36.0 -  46.0 %   MCV 77.7 (L) 80.0 - 100.0 fL   MCH 22.1 (L) 26.0 - 34.0 pg   MCHC 28.4 (L) 30.0 - 36.0 g/dL   RDW 17.2 (H) 11.5 - 15.5 %   Platelets 370 150 - 400 K/uL   nRBC 0.0 0.0 - 0.2 %   Neutrophils Relative % 38 %   Neutro Abs 3.2 1.7 - 7.7 K/uL   Lymphocytes Relative 40 %   Lymphs Abs 3.4 0.7 - 4.0 K/uL   Monocytes Relative 11 %   Monocytes Absolute 0.9 0.1 - 1.0 K/uL   Eosinophils Relative 10 %   Eosinophils Absolute 0.8 (H) 0.0 - 0.5 K/uL   Basophils Relative 1 %   Basophils Absolute 0.1 0.0 - 0.1 K/uL   Immature Granulocytes 0 %   Abs Immature Granulocytes 0.01 0.00 - 0.07 K/uL    Comment: Performed at [facility], [address] [place], [place] [address]  Magnesium     Status: None   Collection Time: 10/01/18 12:33 AM  Result Value Ref Range   Magnesium 2.1 1.7 - 2.4 mg/dL    Comment: Performed at [facility], [address] [place],  [address]  Phosphorus     Status: None   Collection Time: 10/01/18 12:33 AM  Result Value  Ref Range   Phosphorus 4.6 2.5 - 4.6 mg/dL    Comment: Performed at [facility], [address], [place], [place] [address]  Hepatic function panel     Status: None   Collection Time: 10/01/18 12:33 AM  Result Value Ref Range   Total Protein 6.8 6.5 - 8.1 g/dL   Albumin 3.9 3.5 - 5.0 g/dL   AST 29 15 - 41 U/L   ALT 18 0 - 44 U/L   Alkaline Phosphatase 97 38 - 126 U/L   Total Bilirubin 0.5 0.3 - 1.2 mg/dL   Bilirubin, Direct <0.1 0.0 - 0.2 mg/dL   Indirect Bilirubin NOT CALCULATED 0.3 - 0.9 mg/dL    Comment: Performed at [facility], [address], [place], [place] [address]    ABGS No results for input(s): PHART, PO2ART, TCO2, HCO3 in the last 72 hours.  Invalid input(s): PCO2 CULTURES No results found for this or any previous visit (from the past 240 hour(s)). Studies/Results: Dg Chest Portable 1 View  Result Date: 10/01/2018 CLINICAL DATA:  Patient states SOB and chest tightness x a few hours. Hx of COPD, irregular heartbeat,  asthma, and current smoker of 1 pack a day x 57 years. EXAM: PORTABLE CHEST 1 VIEW COMPARISON:  07/02/2018 FINDINGS: Tapering of the peripheral pulmonary vasculature favors emphysema. Atherosclerotic calcification of the aortic arch. Previous airway thickening has improved. The lungs appear otherwise clear.  No pleural effusion. IMPRESSION: 1. Aortic Atherosclerosis (ICD10-I70.0) and Emphysema (ICD10-J43.9). Electronically Signed   By: Van Clines M.D.   On: 10/01/2018 01:17    Medications:  Prior to Admission:  Medications Prior to Admission  Medication Sig Dispense Refill Last Dose  . albuterol (PROVENTIL HFA;VENTOLIN HFA) 108 (90 BASE) MCG/ACT inhaler Inhale 2 puffs into the lungs every 6 (six) hours as needed for wheezing or shortness of breath. 1 Inhaler 1 09/30/2018 at Unknown time  . albuterol (PROVENTIL) (2.5 MG/3ML) 0.083% nebulizer solution Take 2.5 mg by nebulization every 4 (four) hours as needed for shortness of breath.   09/30/2018 at Unknown time  . Multiple Vitamins-Minerals (MULTIVITAMIN ADULT) CHEW Chew 2 each by mouth daily. Vitafusion gummy   09/30/2018 at Unknown time  . roflumilast (DALIRESP) 500 MCG TABS tablet Take 0.5 tablets (250 mcg total) by mouth daily. 30 tablet 12 09/30/2018 at Unknown time  . Tiotropium Bromide-Olodaterol (STIOLTO RESPIMAT) 2.5-2.5 MCG/ACT AERS Inhale 2 Inhalers into the lungs daily.    09/30/2018 at Unknown time  . predniSONE (STERAPRED UNI-PAK 21 TAB) 10 MG (21) TBPK tablet Take by package instructions (Patient not taking: Reported on 10/01/2018) 21 tablet 1 Completed Course at Unknown time   Scheduled: . heparin  5,000 Units Subcutaneous Q8H  . ipratropium-albuterol  3 mL Nebulization Q6H  . predniSONE  40 mg Oral Q breakfast  . roflumilast  250 mcg Oral Daily   Continuous:  TYO:MAYOKHTXHFSFS **OR** acetaminophen, albuterol, ondansetron **OR** ondansetron (ZOFRAN) IV  Assesment: She was admitted with COPD exacerbation.  She is much  improved.  She has reflux and she is had some GI bleeding in the past.  She has chronic anemia.  She has malnutrition which is an ongoing issue but she is better  She has hypertension which has not generally required medications Principal Problem:   COPD exacerbation ([place]) Active Problems:   COPD (chronic obstructive pulmonary disease) (HCC)   GERD (gastroesophageal reflux disease)   Malnutrition ([place])   Essential hypertension    Plan: Discharge home today    LOS: 1 day  Mayling Aber L 10/02/2018, 8:35 AM

## 2018-10-02 NOTE — Discharge Summary (Signed)
Physician Discharge Summary  Patient ID: Katelyn [name] MRN: [id number] DOB/AGE: 1941/08/17 77 y.o. Primary Care Physician:Hanin Decook, Ramon Dredge, MD Admit date: 10/01/2018 Discharge date: 10/02/2018    Discharge Diagnoses:   Principal Problem:   COPD exacerbation (HCC) Active Problems:   COPD (chronic obstructive pulmonary disease) (HCC)   GERD (gastroesophageal reflux disease)   Malnutrition (HCC)   Essential hypertension   Allergies as of 10/02/2018   No Known Allergies     Medication List    TAKE these medications   albuterol (2.5 MG/3ML) 0.083% nebulizer solution Commonly known as:  PROVENTIL Take 2.5 mg by nebulization every 4 (four) hours as needed for shortness of breath.   albuterol 108 (90 Base) MCG/ACT inhaler Commonly known as:  PROVENTIL HFA;VENTOLIN HFA Inhale 2 puffs into the lungs every 6 (six) hours as needed for wheezing or shortness of breath.   azithromycin 250 MG tablet Commonly known as:  ZITHROMAX Take by package instructions   MULTIVITAMIN ADULT Chew Chew 2 each by mouth daily. Vitafusion gummy   predniSONE 10 MG (21) Tbpk tablet Commonly known as:  STERAPRED UNI-PAK 21 TAB Take by package instructions What changed:  Another medication with the same name was added. Make sure you understand how and when to take each.   predniSONE 10 MG (21) Tbpk tablet Commonly known as:  STERAPRED UNI-PAK 21 TAB Take by package instructions What changed:  You were already taking a medication with the same name, and this prescription was added. Make sure you understand how and when to take each.   roflumilast 500 MCG Tabs tablet Commonly known as:  DALIRESP Take 0.5 tablets (250 mcg total) by mouth daily.   STIOLTO RESPIMAT 2.5-2.5 MCG/ACT Aers Generic drug:  Tiotropium Bromide-Olodaterol Inhale 2 Inhalers into the lungs daily.       Discharged Condition: Improved    Consults: None  Significant Diagnostic Studies: Dg Chest Portable 1 View  Result  Date: 10/01/2018 CLINICAL DATA:  Patient states SOB and chest tightness x a few hours. Hx of COPD, irregular heartbeat, asthma, and current smoker of 1 pack a day x 57 years. EXAM: PORTABLE CHEST 1 VIEW COMPARISON:  07/02/2018 FINDINGS: Tapering of the peripheral pulmonary vasculature favors emphysema. Atherosclerotic calcification of the aortic arch. Previous airway thickening has improved. The lungs appear otherwise clear.  No pleural effusion. IMPRESSION: 1. Aortic Atherosclerosis (ICD10-I70.0) and Emphysema (ICD10-J43.9). Electronically Signed   By: Gaylyn Rong M.D.   On: 10/01/2018 01:17    Lab Results: Basic Metabolic Panel: Recent Labs    10/01/18 0033  NA 140  K 4.0  CL 107  CO2 26  GLUCOSE 112*  BUN 16  CREATININE 0.87  CALCIUM 8.8*  MG 2.1  PHOS 4.6   Liver Function Tests: Recent Labs    10/01/18 0033  AST 29  ALT 18  ALKPHOS 97  BILITOT 0.5  PROT 6.8  ALBUMIN 3.9     CBC: Recent Labs    10/01/18 0033  WBC 8.4  NEUTROABS 3.2  HGB 9.1*  HCT 32.0*  MCV 77.7*  PLT 370    No results found for this or any previous visit (from the past 240 hour(s)).   Hospital Course: This is a 77 year old who has a long known history of COPD with multiple exacerbations.  She finally stopped smoking about 3 months ago when she is done better but developed a COPD exacerbation that she relates to the change in weather.  She was coughing and congested.  She came to  the emergency department where she was noted to have acute respiratory failure.  She was placed on BiPAP briefly and improved.  She was given IV steroids.  She was given nebulizer treatments.  She improved rapidly required hospitalization for a period of about 38 hours and then was back at baseline.  She is discharged home in improved condition.  Discharge Exam: Blood pressure 108/77, pulse 92, temperature 98.1 F (36.7 C), temperature source Oral, resp. rate 20, height 5\' 4"  (1.626 m), weight 44.6 kg, SpO2 98  %. She is awake and alert.  She is very thin.  Her chest is clear with prolonged expiratory phase.  Disposition: Home.  She does not want home health services      Signed: Sayer Masini L   10/02/2018, 8:39 AM

## 2018-10-02 NOTE — Progress Notes (Signed)
Discharge instructions given to patient.  Patient verbalized understanding of instructions.  Patient discharged home in stable condition.

## 2018-10-02 NOTE — Progress Notes (Signed)
OT Cancellation Note  Patient Details Name: Katelyn [name] MRN: [id number] DOB: 1941-10-09   Cancelled Treatment:    Reason Eval/Treat Not Completed: OT screened, no needs identified, will sign off. Chart reviewed, pt screened for OT needs. Pt is functioning at baseline, no further OT services required at this time.   Guadelupe Sabin, OTR/L  [phone] 10/02/2018, 7:17 AM

## 2018-10-02 NOTE — Care Management Note (Addendum)
Case Management Note  Patient Details  Name: Katelyn [name] MRN: [id number] Date of Birth: December 02, 1941  Subjective/Objective:           COPD exacerbation. Pt from home, ind. She has PCP, transportation and insurance with drug coverage. .  Pt has neb machine and is able to afford neb treatments.          Patient has had multiple admission for same. Declines home health services. She is not [facility] eligible.               Action/Plan: DC home.   Expected Discharge Date:  10/02/18               Expected Discharge Plan:  Home/Self Care  In-House Referral:     Discharge planning Services  CM Consult  Post Acute Care Choice:  NA Choice offered to:  NA  DME Arranged:    DME Agency:     HH Arranged:    HH Agency:     Status of Service:  Completed, signed off  If discussed at [employer] of Stay Meetings, dates discussed:    Additional Comments:  [place] Warriner, Chauncey [place], RN 10/02/2018, 8:46 AM

## 2018-10-02 NOTE — Care Management Important Message (Signed)
Important Message  Patient Details  Name: DEVANEY SEGERS MRN: [id number] Date of Birth: 06-01-1941   Medicare Important Message Given:  Yes    Shelda Altes 10/02/2018, 10:37 AM

## 2018-11-01 IMAGING — CT CT ANGIO CHEST
2 of 7 series · 18 of 46 positions shown · IV contrast (Isovue)
Comparison: Prior CT scan of the chest 10/19/2016

CLINICAL DATA: 76-year-old female with shortness of breath and
cough.

EXAM:
CT ANGIOGRAPHY CHEST WITH CONTRAST
TECHNIQUE: Multidetector CT imaging of the chest was performed using the
standard protocol during bolus administration of intravenous
contrast. Multiplanar CT image reconstructions and MIPs were
obtained to evaluate the vascular anatomy.
CONTRAST:  100mL [id number] IOPAMIDOL ([id number]) INJECTION 76%

[Series 5: thins · axial · 0.60mm/px · z∈[+1292,+1576]mm · 15 of 321 slices shown]
[im 19/321  lung]
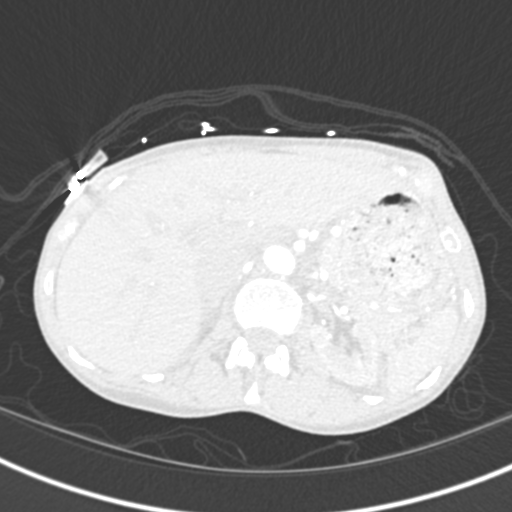
[im 38/321  soft-tissue]
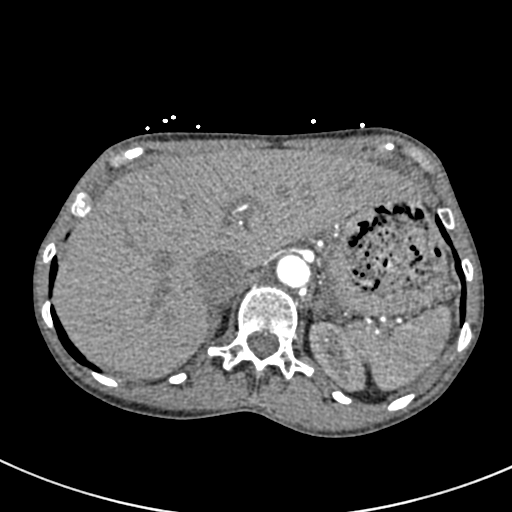
[im 57/321  lung]
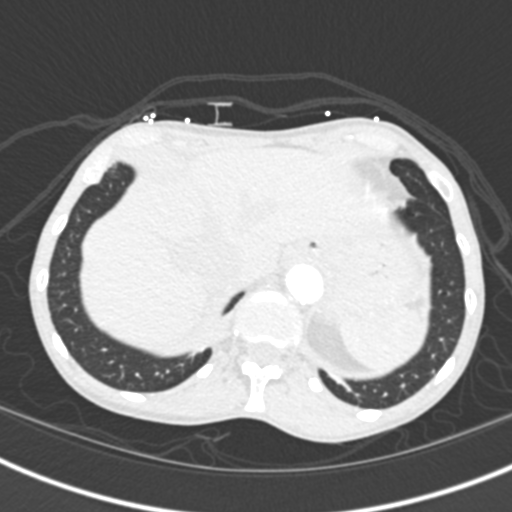
[im 76/321  soft-tissue]
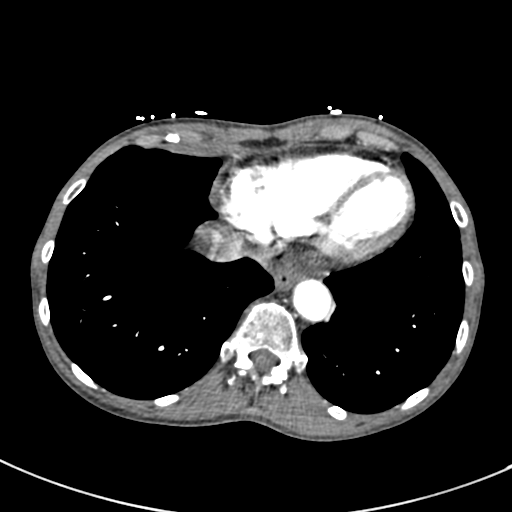
[im 95/321  lung]
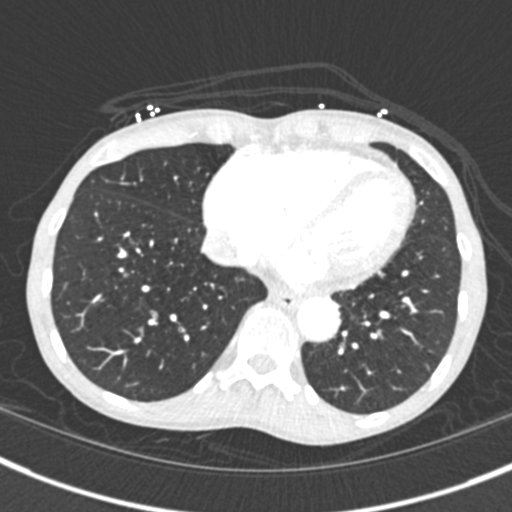
[im 113/321  soft-tissue]
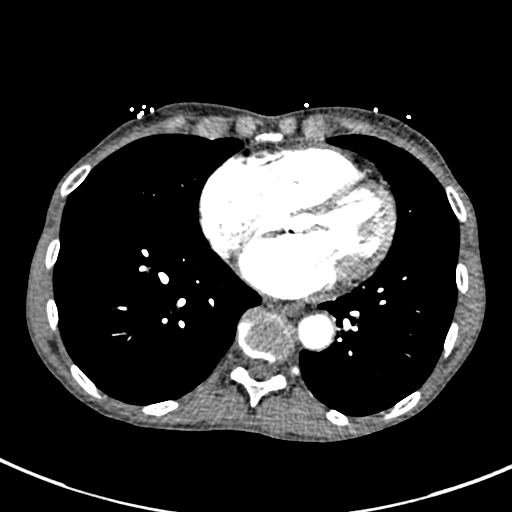
[im 132/321  lung]
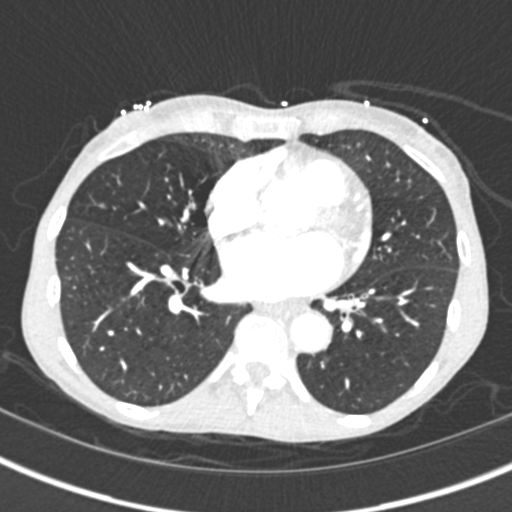
[im 170/321  soft-tissue]
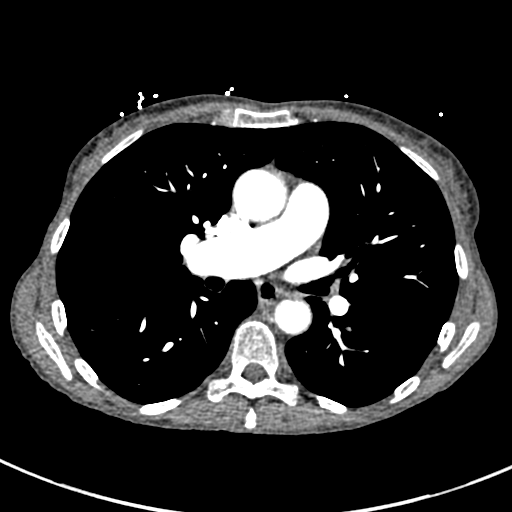
[im 189/321  lung]
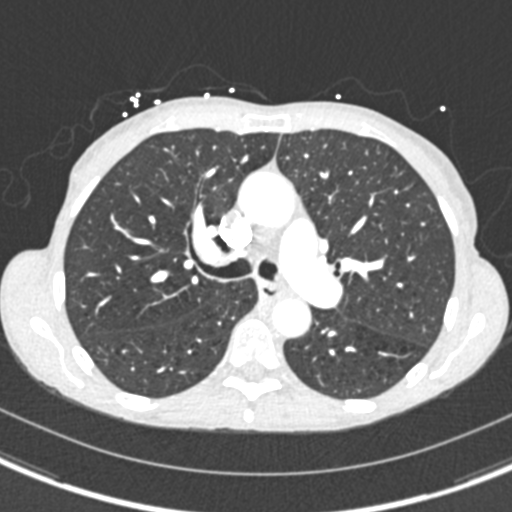
[im 208/321  soft-tissue]
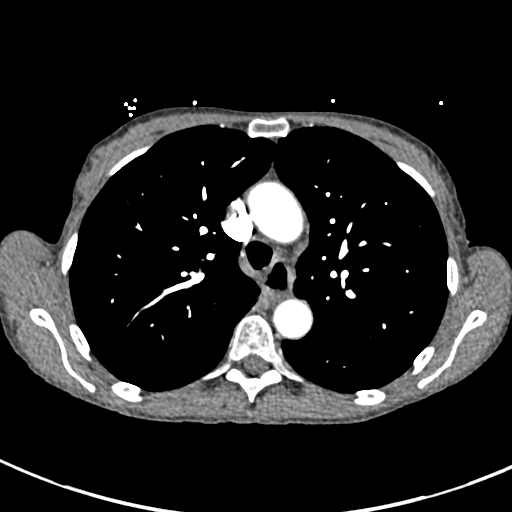
[im 226/321  lung]
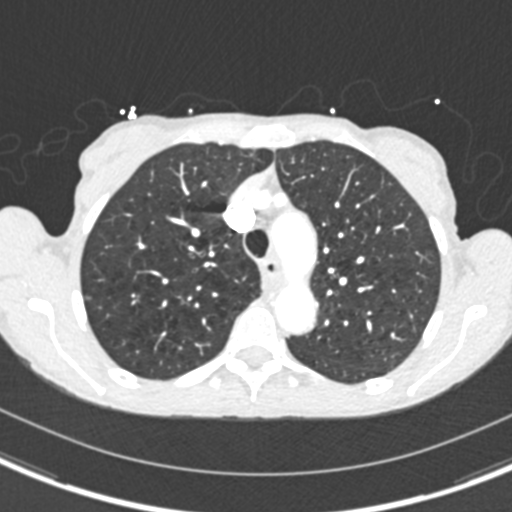
[im 245/321  soft-tissue]
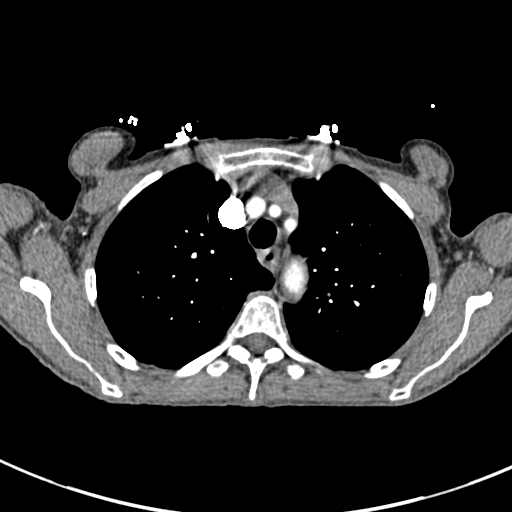
[im 264/321  lung]
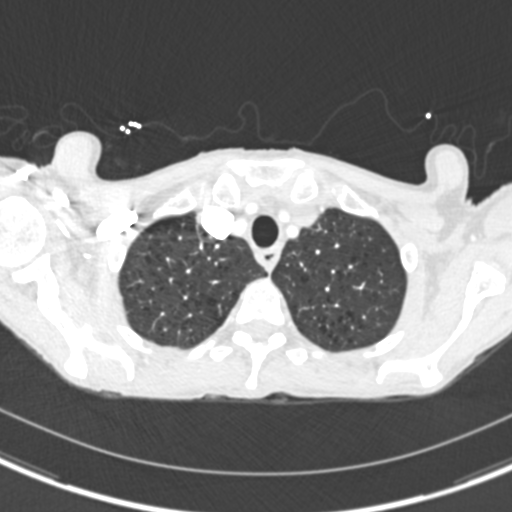
[im 283/321  soft-tissue]
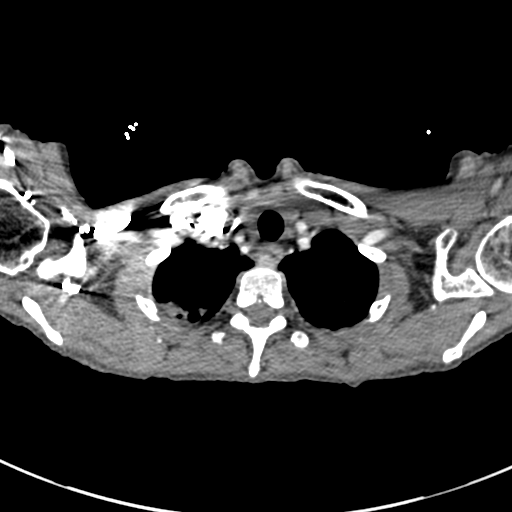
[im 302/321  lung]
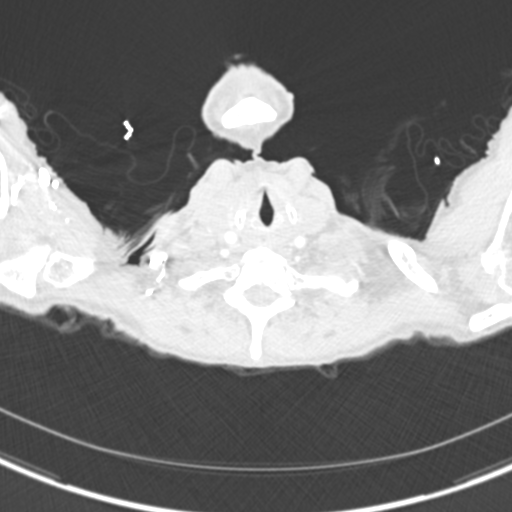

[Series 8: coronal mpr · coronal · 0.58mm/px · 3 of 148 slices shown]
[im 37/148  soft-tissue]
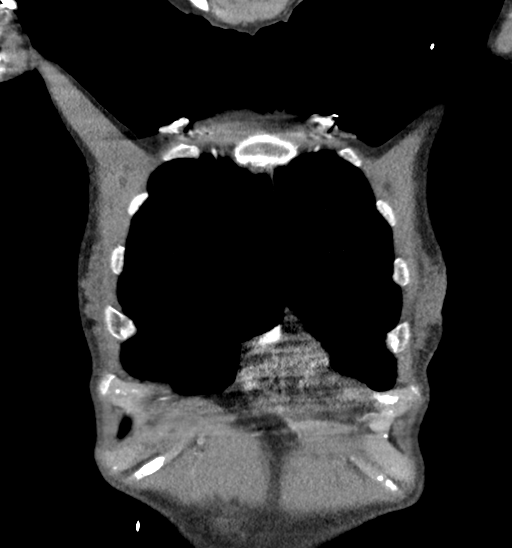
[im 74/148  soft-tissue]
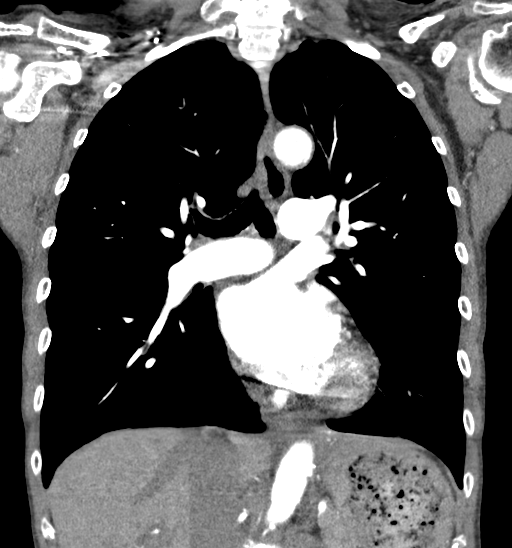
[im 111/148  soft-tissue]
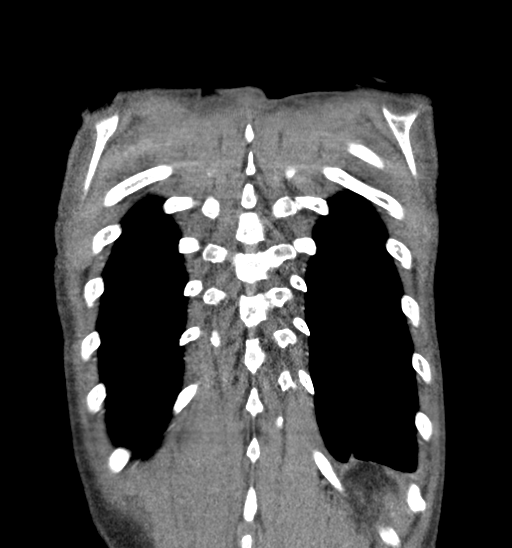

[18 of 46 positions shown; findings below may reference images not displayed]

FINDINGS: Cardiovascular: Adequate opacification of the pulmonary arteries to
the proximal subsegmental level. No evidence of central filling
defect to suggest acute pulmonary embolus. The main pulmonary artery
is normal in size. The heart is within normal limits in size. No
pericardial effusion. Normal caliber thoracic aorta with trace
atherosclerotic vascular calcifications.

Mediastinum/Nodes: Unremarkable CT appearance of the thyroid gland.
No suspicious mediastinal or hilar adenopathy. No soft tissue
mediastinal mass. The thoracic esophagus is unremarkable.

Lungs/Pleura: Mild biapical pleuroparenchymal scarring. Stable 5 mm
stellate focus of ground-glass attenuation opacity in the posterior
right upper lobe dating back to Thursday September, 2016. Additional
scattered tiny 2 mm bilateral upper lobe pulmonary nodules also
remain unchanged consistent with benignity. There is a background of
moderate centrilobular pulmonary emphysema and mild bronchial wall
thickening. No pneumonia or pleural effusion. No pneumothorax.

Upper Abdomen: No acute abnormality.

Musculoskeletal: No chest wall abnormality. No acute or significant
osseous findings.

Review of the MIP images confirms the above findings.
IMPRESSION: 1. Negative for acute pulmonary embolus, pneumonia or other acute
cardiopulmonary process.
2. Moderate centrilobular pulmonary emphysema.
3. Stable bilateral upper lobe pulmonary nodules dating back to
10/19/2016 consistent with a benign process such as scarring and/or
old granulomatous disease.

Aortic Atherosclerosis ([id number]-[id number].Z) and Emphysema ([id number].Q).

## 2018-11-02 DIAGNOSIS — R634 Abnormal weight loss: Secondary | ICD-10-CM | POA: Diagnosis not present

## 2018-11-02 DIAGNOSIS — D500 Iron deficiency anemia secondary to blood loss (chronic): Secondary | ICD-10-CM | POA: Diagnosis not present

## 2018-11-02 DIAGNOSIS — E46 Unspecified protein-calorie malnutrition: Secondary | ICD-10-CM | POA: Diagnosis not present

## 2018-11-02 DIAGNOSIS — J449 Chronic obstructive pulmonary disease, unspecified: Secondary | ICD-10-CM | POA: Diagnosis not present

## 2018-11-06 ENCOUNTER — Emergency Department (HOSPITAL_COMMUNITY): Payer: Medicare Other

## 2018-11-06 ENCOUNTER — Emergency Department (HOSPITAL_COMMUNITY)
Admission: EM | Admit: 2018-11-06 | Discharge: 2018-11-06 | Disposition: A | Discharge status: Home / Self Care | Payer: Medicare Other | Attending: Emergency Medicine | Admitting: Emergency Medicine

## 2018-11-06 ENCOUNTER — Other Ambulatory Visit: Payer: Self-pay

## 2018-11-06 ENCOUNTER — Encounter (HOSPITAL_COMMUNITY): Payer: Self-pay | Admitting: Emergency Medicine

## 2018-11-06 DIAGNOSIS — I10 Essential (primary) hypertension: Secondary | ICD-10-CM | POA: Insufficient documentation

## 2018-11-06 DIAGNOSIS — F17210 Nicotine dependence, cigarettes, uncomplicated: Secondary | ICD-10-CM | POA: Insufficient documentation

## 2018-11-06 DIAGNOSIS — J441 Chronic obstructive pulmonary disease with (acute) exacerbation: Secondary | ICD-10-CM | POA: Insufficient documentation

## 2018-11-06 DIAGNOSIS — R911 Solitary pulmonary nodule: Secondary | ICD-10-CM | POA: Diagnosis not present

## 2018-11-06 DIAGNOSIS — Z79899 Other long term (current) drug therapy: Secondary | ICD-10-CM | POA: Diagnosis not present

## 2018-11-06 DIAGNOSIS — R0602 Shortness of breath: Secondary | ICD-10-CM | POA: Diagnosis not present

## 2018-11-06 DIAGNOSIS — J432 Centrilobular emphysema: Secondary | ICD-10-CM | POA: Diagnosis not present

## 2018-11-06 LAB — CBC WITH DIFFERENTIAL/PLATELET
Abs Immature Granulocytes: 0.01 10*3/uL (ref 0.00–0.07)
Basophils Absolute: 0.1 10*3/uL (ref 0.0–0.1)
Basophils Relative: 1 %
EOS ABS: 0.6 10*3/uL — AB (ref 0.0–0.5)
EOS PCT: 9 %
HCT: 31.1 % — ABNORMAL LOW (ref 36.0–46.0)
HEMOGLOBIN: 9 g/dL — AB (ref 12.0–15.0)
Immature Granulocytes: 0 %
Lymphocytes Relative: 42 %
Lymphs Abs: 2.7 10*3/uL (ref 0.7–4.0)
MCH: 22 pg — AB (ref 26.0–34.0)
MCHC: 28.9 g/dL — AB (ref 30.0–36.0)
MCV: 75.9 fL — AB (ref 80.0–100.0)
Monocytes Absolute: 1 10*3/uL (ref 0.1–1.0)
Monocytes Relative: 15 %
NEUTROS PCT: 33 %
NRBC: 0 % (ref 0.0–0.2)
Neutro Abs: 2.1 10*3/uL (ref 1.7–7.7)
Platelets: 396 10*3/uL (ref 150–400)
RBC: 4.1 MIL/uL (ref 3.87–5.11)
RDW: 17.9 % — ABNORMAL HIGH (ref 11.5–15.5)
WBC: 6.5 10*3/uL (ref 4.0–10.5)

## 2018-11-06 LAB — COMPREHENSIVE METABOLIC PANEL
ALBUMIN: 3.9 g/dL (ref 3.5–5.0)
ALK PHOS: 83 U/L (ref 38–126)
ALT: 17 U/L (ref 0–44)
ANION GAP: 5 (ref 5–15)
AST: 26 U/L (ref 15–41)
BILIRUBIN TOTAL: 0.6 mg/dL (ref 0.3–1.2)
BUN: 17 mg/dL (ref 8–23)
CO2: 25 mmol/L (ref 22–32)
Calcium: 8.8 mg/dL — ABNORMAL LOW (ref 8.9–10.3)
Chloride: 109 mmol/L (ref 98–111)
Creatinine, Ser: 0.78 mg/dL (ref 0.44–1.00)
GFR calc Af Amer: >60 mL/min (ref >60)
GFR calc non Af Amer: >60 mL/min (ref >60)
GLUCOSE: 84 mg/dL (ref 70–99)
Potassium: 4.3 mmol/L (ref 3.5–5.1)
Sodium: 139 mmol/L (ref 135–145)
TOTAL PROTEIN: 6.5 g/dL (ref 6.5–8.1)

## 2018-11-06 LAB — I-STAT CHEM 8, ED
BUN: 18 mg/dL (ref 8–23)
Calcium, Ion: 1.19 mmol/L (ref 1.15–1.40)
Chloride: 105 mmol/L (ref 98–111)
Creatinine, Ser: 0.9 mg/dL (ref 0.44–1.00)
Glucose, Bld: 83 mg/dL (ref 70–99)
HCT: 31 % — ABNORMAL LOW (ref 36.0–46.0)
Hemoglobin: 10.5 g/dL — ABNORMAL LOW (ref 12.0–15.0)
Potassium: 4.3 mmol/L (ref 3.5–5.1)
Sodium: 140 mmol/L (ref 135–145)
TCO2: 25 mmol/L (ref 22–32)

## 2018-11-06 MED ORDER — PREDNISONE 10 MG PO TABS: 20.0000 mg | ORAL_TABLET | Freq: Every day | ORAL | 0 refills | Status: DC

## 2018-11-06 MED ORDER — ALBUTEROL SULFATE (2.5 MG/3ML) 0.083% IN NEBU
2.5000 mg | INHALATION_SOLUTION | Freq: Once | RESPIRATORY_TRACT | Status: AC
  Administered 2018-11-06: 2.5 mg via RESPIRATORY_TRACT
  Filled 2018-11-06: qty 3

## 2018-11-06 MED ORDER — METHYLPREDNISOLONE SODIUM SUCC 125 MG IJ SOLR
125.0000 mg | Freq: Once | INTRAMUSCULAR | Status: AC
  Administered 2018-11-06: 125 mg via INTRAVENOUS
  Filled 2018-11-06: qty 2

## 2018-11-06 MED ORDER — IPRATROPIUM-ALBUTEROL 0.5-2.5 (3) MG/3ML IN SOLN
3.0000 mL | Freq: Once | RESPIRATORY_TRACT | Status: AC
  Administered 2018-11-06: 3 mL via RESPIRATORY_TRACT
  Filled 2018-11-06: qty 3

## 2018-11-06 MED ORDER — MAGNESIUM SULFATE 2 GM/50ML IV SOLN
2.0000 g | Freq: Once | INTRAVENOUS | Status: AC
  Administered 2018-11-06: 2 g via INTRAVENOUS
  Filled 2018-11-06: qty 50

## 2018-11-06 MED ORDER — DOXYCYCLINE HYCLATE 100 MG PO TABS
100.0000 mg | ORAL_TABLET | Freq: Once | ORAL | Status: AC
  Administered 2018-11-06: 100 mg via ORAL
  Filled 2018-11-06: qty 1

## 2018-11-06 MED ORDER — DOXYCYCLINE HYCLATE 100 MG PO CAPS: 100.0000 mg | ORAL_CAPSULE | Freq: Two times a day (BID) | ORAL | 0 refills | Status: DC

## 2018-11-06 NOTE — ED Triage Notes (Signed)
Pt c/o SOB x 2-3 days, pt has been using nebs and inhaler at home with no improvement, pt states she feels as if she has thick fleam in her throat and is unable to cough it up

## 2018-11-06 NOTE — ED Provider Notes (Signed)
[facility] EMERGENCY DEPARTMENT Provider Note   CSN: [id number] Arrival date & time: 11/06/18  2012     History   Chief Complaint Chief Complaint  Patient presents with  . Shortness of Breath    HPI Katelyn [name] is a 77 y.o. female.  Patient complains of shortness of breath cough.  Patient has history of significant COPD  The history is provided by the patient. No language interpreter was used.  Shortness of Breath  This is a new problem. The problem occurs continuously.The current episode started 2 days ago. The problem has not changed since onset.Associated symptoms include wheezing. Pertinent negatives include no fever, no headaches, no cough, no chest pain, no abdominal pain and no rash. Precipitated by: Unknown. Risk factors include smoking. She has tried ipratropium inhalers for the symptoms. The treatment provided no relief. She has had prior hospitalizations. She has had prior ED visits. She has had prior ICU admissions. Associated medical issues include COPD.    Past Medical History:  Diagnosis Date  . Asthma   . Chronic neck pain   . Chronic pain of left elbow   . Complication of anesthesia    hard to wake  . COPD (chronic obstructive pulmonary disease) ([place])   . Dysphagia    requiring multiple dilations in past  . Headache    Patient states she needs  eyeglasses  . Irregular heart beat   . S/P colonoscopy 2009   dr. Gala Romney: anal papilla, 2 cecal AVMs. Due in 2014  . S/P endoscopy 2012   Dr. Gala Romney: mild chronic gastritis, bulbar erosions, s/p 54-French Maloney dilation   . S/P endoscopy 2003, 2004, 2007   gastritis, duodenitis, s/p dilation, +H.pylori     Patient Active Problem List   Diagnosis Date Noted  . IDA (iron deficiency anemia) 07/04/2018  . Acute metabolic encephalopathy 53/61/4431  . Acute respiratory failure with hypoxia and hypercapnia ([place]) 07/02/2018  . GERD (gastroesophageal reflux disease) 06/13/2018  . Malnutrition ([place]) 06/13/2018    . Essential hypertension 06/13/2018  . Tachycardia 04/14/2018  . Acute gastritis 03/31/2018  . Esophagitis 03/31/2018  . Duodenal ulcer 03/31/2018  . Goals of care, counseling/discussion   . DNR (do not resuscitate) discussion   . Palliative care by specialist   . COPD with acute exacerbation ([place]) 03/29/2018  . COPD (chronic obstructive pulmonary disease) ([place]) 02/03/2018  . Hypoxia 11/28/2015  . COPD exacerbation ([place]) 11/28/2015  . Acute respiratory failure with hypoxia ([place]) 11/28/2015  . SOB (shortness of breath) 11/28/2015  . Mucosal abnormality of stomach   . History of colonic polyps 04/25/2015  . 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 Dysphagia 03/11/2011    Past Surgical History:  Procedure Laterality Date  . APPENDECTOMY    . BIOPSY  03/30/2018   Procedure: BIOPSY;  Surgeon: Daneil Dolin, MD;  Location: [facility];  Service: Gastroenterology;;  gastric  . CHOLECYSTECTOMY    . COLONOSCOPY  10/10/2008   RMR: 1. Anal papilla, otherwise normal rectum. 2. Two cecal arteriovenous malformations, colonic mucosa apperaed normal.   . COLONOSCOPY N/A 05/10/2015   RMR: Rectal and colonic polyps removed as described above. Ascending colon AVMs . Rectal polyps were tubular adenomas. Next colonoscopy May 2021.  . ESOPHAGEAL DILATION N/A 05/10/2015   Procedure: ESOPHAGEAL DILATION;  Surgeon: Daneil Dolin, MD;  Location: [facility];  Service: Endoscopy;  Laterality: N/A;  . ESOPHAGOGASTRODUODENOSCOPY  04/02/2011   RMR: 1. Endoscopically normal -appearing esophagus status post passage of a [facility]  dilator followed by biopsy. 2. Hiatal hernia, Mottling, and submucosal petechiae in teh gastric mucoas of uncertain significance status post biopsy. 3. Bulbar erosions as described above.   . ESOPHAGOGASTRODUODENOSCOPY N/A 05/10/2015   RMR: Normal esophagus status post Maloney dilation. Small hiatal hernia. Abnormal gastric because of doubtful clinical significance status post biopsy (benign)   . ESOPHAGOGASTRODUODENOSCOPY (EGD) WITH PROPOFOL N/A 03/30/2018   Procedure: ESOPHAGOGASTRODUODENOSCOPY (EGD) WITH PROPOFOL;  Surgeon: Daneil Dolin, MD;  Location: [facility];  Service: Gastroenterology;  Laterality: N/A;  WITH DILATION   . INGUINAL HERNIA REPAIR    . MALONEY DILATION  03/30/2018   Procedure: Venia Minks DILATION;  Surgeon: Daneil Dolin, MD;  Location: [facility];  Service: Gastroenterology;;  . TONSILLECTOMY    . TOTAL ABDOMINAL HYSTERECTOMY       OB History    Gravida  5   Para  5   Term  4   Preterm  1   AB      Living  4     SAB      TAB      Ectopic      Multiple      Live Births               Home Medications    Prior to Admission medications   Medication Sig Start Date End Date Taking? Authorizing Provider  albuterol (PROVENTIL HFA;VENTOLIN HFA) 108 (90 BASE) MCG/ACT inhaler Inhale 2 puffs into the lungs every 6 (six) hours as needed for wheezing or shortness of breath. 09/04/15   [name], MD  albuterol (PROVENTIL) (2.5 MG/3ML) 0.083% nebulizer solution Take 2.5 mg by nebulization every 4 (four) hours as needed for shortness of breath.    [provider]  azithromycin (ZITHROMAX Z-PAK) 250 MG tablet Take by package instructions 10/02/18   Sinda Du, MD  Multiple Vitamins-Minerals (MULTIVITAMIN ADULT) CHEW Chew 2 each by mouth daily. Vitafusion gummy    [provider]  predniSONE (STERAPRED UNI-PAK 21 TAB) 10 MG (21) TBPK tablet Take by package instructions Patient not taking: Reported on 10/01/2018 07/05/18   Sinda Du, MD  predniSONE (STERAPRED UNI-PAK 21 TAB) 10 MG (21) TBPK tablet Take by package instructions 10/02/18   Sinda Du, MD  roflumilast (DALIRESP) 500 MCG TABS tablet Take 0.5 tablets (250 mcg total) by mouth daily. 07/05/18   Sinda Du, MD  Tiotropium Bromide-Olodaterol (STIOLTO RESPIMAT) 2.5-2.5 MCG/ACT AERS Inhale 2 Inhalers into the lungs daily.     [provider]     Family History Family History  Problem Relation Age of Onset  . Heart disease Mother   . Lung cancer Father   . Cancer Sister   . Diabetes Brother   . Colon cancer Neg Hx     Social History Social History   Tobacco Use  . Smoking status: Current Every Day Smoker    Packs/day: 1.00    Years: 57.00    Pack years: 57.00    Types: Cigarettes    Start date: 08/18/1959  . Smokeless tobacco: Never Used  Substance Use Topics  . Alcohol use: No    Alcohol/week: 0.0 standard drinks  . Drug use: No     Allergies   Patient has no known allergies.   Review of Systems Review of Systems  Constitutional: Negative for appetite change, fatigue and fever.  HENT: Negative for congestion, ear discharge and sinus pressure.   Eyes: Negative for discharge.  Respiratory: Positive for shortness of breath and  wheezing. Negative for cough.   Cardiovascular: Negative for chest pain.  Gastrointestinal: Negative for abdominal pain and diarrhea.  Genitourinary: Negative for frequency and hematuria.  Musculoskeletal: Negative for back pain.  Skin: Negative for rash.  Neurological: Negative for seizures and headaches.  Psychiatric/Behavioral: Negative for hallucinations.     Physical Exam Updated Vital Signs BP 108/66   Pulse 74   Temp 97.7 F (36.5 C) (Oral)   Resp (!) 21   Ht 5\' 4"  (1.626 m)   Wt 45.4 kg   SpO2 98%   BMI 17.16 kg/m   Physical Exam  Constitutional: She is oriented to person, place, and time. She appears well-developed.  HENT:  Head: Normocephalic.  Eyes: Conjunctivae and EOM are normal. No scleral icterus.  Neck: Neck supple. No thyromegaly present.  Cardiovascular: Normal rate and regular rhythm. Exam reveals no gallop and no friction rub.  No murmur heard. Pulmonary/Chest: No stridor. She has wheezes. She has no rales. She exhibits no tenderness.  Abdominal: She exhibits no distension. There is no tenderness. There is no rebound.  Musculoskeletal: Normal  range of motion. She exhibits no edema.  Lymphadenopathy:    She has no cervical adenopathy.  Neurological: She is oriented to person, place, and time. She exhibits normal muscle tone. Coordination normal.  Skin: No rash noted. No erythema.  Psychiatric: She has a normal mood and affect. Her behavior is normal.     ED Treatments / Results  Labs (all labs ordered are listed, but only abnormal results are displayed) Labs Reviewed  CBC WITH DIFFERENTIAL/PLATELET - Abnormal; Notable for the following components:      Result Value   Hemoglobin 9.0 (*)    HCT 31.1 (*)    MCV 75.9 (*)    MCH 22.0 (*)    MCHC 28.9 (*)    RDW 17.9 (*)    Eosinophils Absolute 0.6 (*)    All other components within normal limits  COMPREHENSIVE METABOLIC PANEL - Abnormal; Notable for the following components:   Calcium 8.8 (*)    All other components within normal limits  I-STAT CHEM 8, ED - Abnormal; Notable for the following components:   Hemoglobin 10.5 (*)    HCT 31.0 (*)    All other components within normal limits    EKG None  Radiology Dg Chest Portable 1 View  Result Date: 11/06/2018 CLINICAL DATA:  2-3 day history of shortness of breath. EXAM: PORTABLE CHEST 1 VIEW COMPARISON:  10/01/2018 FINDINGS: 2031 hours. Lungs are hyperexpanded. The lungs are clear without focal pneumonia, edema, pneumothorax or pleural effusion. Pulmonary nodule overlies the anterior left second rib. The cardiopericardial silhouette is within normal limits for size. The visualized bony structures of the thorax are intact. Telemetry leads overlie the chest. IMPRESSION: 1. Left upper lobe pulmonary nodule. No nodule was seen at this location on previous chest CT of 04/15/2018. CT chest without contrast may be warranted to further evaluate. 2. No edema or focal consolidation. Electronically Signed   By: Misty Stanley M.D.   On: 11/06/2018 20:48    Procedures Procedures (including critical care time)  Medications Ordered  in ED Medications  ipratropium-albuterol (DUONEB) 0.5-2.5 (3) MG/3ML nebulizer solution 3 mL (3 mLs Nebulization Given 11/06/18 2037)  albuterol (PROVENTIL) (2.5 MG/3ML) 0.083% nebulizer solution 2.5 mg (2.5 mg Nebulization Given 11/06/18 2037)  methylPREDNISolone sodium succinate (SOLU-MEDROL) 125 mg/2 mL injection 125 mg (125 mg Intravenous Given 11/06/18 2029)  magnesium sulfate IVPB 2 g 50 mL (0 g Intravenous  Stopped 11/06/18 2138)  ipratropium-albuterol (DUONEB) 0.5-2.5 (3) MG/3ML nebulizer solution 3 mL (3 mLs Nebulization Given 11/06/18 2134)  albuterol (PROVENTIL) (2.5 MG/3ML) 0.083% nebulizer solution 2.5 mg (2.5 mg Nebulization Given 11/06/18 2134)     Initial Impression / Assessment and Plan / ED Course  I have reviewed the triage vital signs and the nursing notes.  Pertinent labs & imaging results that were available during my care of the patient were reviewed by me and considered in my medical decision making (see chart for details).     CRITICAL CARE Performed by: Milton Ferguson Total critical care time: 35 minutes Critical care time was exclusive of separately billable procedures and treating other patients. Critical care was necessary to treat or prevent imminent or life-threatening deterioration. Critical care was time spent personally by me on the following activities: development of treatment plan with patient and/or surrogate as well as nursing, discussions with consultants, evaluation of patient's response to treatment, examination of patient, obtaining history from patient or surrogate, ordering and performing treatments and interventions, ordering and review of laboratory studies, ordering and review of radiographic studies, pulse oximetry and re-evaluation of patient's condition. CT scan unremarkable.  Patient with COPD exacerbation.  She will be sent home on doxycycline and prednisone and follow-up with her pulmonologist Final Clinical Impressions(s) / ED Diagnoses    Final diagnoses:  None    ED Discharge Orders    None       Milton Ferguson, MD 11/06/18 2223

## 2018-11-06 NOTE — Discharge Instructions (Addendum)
Follow-up with Dr. Luan Pulling next week.  Use your neb treatment every 4-6 hours if needed

## 2018-11-11 DIAGNOSIS — F419 Anxiety disorder, unspecified: Secondary | ICD-10-CM | POA: Diagnosis not present

## 2018-11-11 DIAGNOSIS — E46 Unspecified protein-calorie malnutrition: Secondary | ICD-10-CM | POA: Diagnosis not present

## 2018-11-11 DIAGNOSIS — R634 Abnormal weight loss: Secondary | ICD-10-CM | POA: Diagnosis not present

## 2018-11-11 DIAGNOSIS — J441 Chronic obstructive pulmonary disease with (acute) exacerbation: Secondary | ICD-10-CM | POA: Diagnosis not present

## 2018-12-25 DIAGNOSIS — E46 Unspecified protein-calorie malnutrition: Secondary | ICD-10-CM | POA: Diagnosis not present

## 2018-12-25 DIAGNOSIS — F419 Anxiety disorder, unspecified: Secondary | ICD-10-CM | POA: Diagnosis not present

## 2018-12-25 DIAGNOSIS — J441 Chronic obstructive pulmonary disease with (acute) exacerbation: Secondary | ICD-10-CM | POA: Diagnosis not present

## 2018-12-25 DIAGNOSIS — J301 Allergic rhinitis due to pollen: Secondary | ICD-10-CM | POA: Diagnosis not present

## 2019-01-06 DIAGNOSIS — F419 Anxiety disorder, unspecified: Secondary | ICD-10-CM | POA: Diagnosis not present

## 2019-01-06 DIAGNOSIS — D500 Iron deficiency anemia secondary to blood loss (chronic): Secondary | ICD-10-CM | POA: Diagnosis not present

## 2019-01-06 DIAGNOSIS — E46 Unspecified protein-calorie malnutrition: Secondary | ICD-10-CM | POA: Diagnosis not present

## 2019-01-06 DIAGNOSIS — J449 Chronic obstructive pulmonary disease, unspecified: Secondary | ICD-10-CM | POA: Diagnosis not present

## 2019-01-18 IMAGING — CR DG CHEST 1V PORT
1 series · 2 of 2 positions shown · non-contrast
Comparison: 06/12/2018

CLINICAL DATA: SOB increasing x1 day. AMS. Hx of asthma, COPD,
irregular heart beat. Smoker.

EXAM:
PORTABLE CHEST 1 VIEW

[Series 1: portable · 0.17mm/px · 2 of 2 slices shown]
[im 1/2]
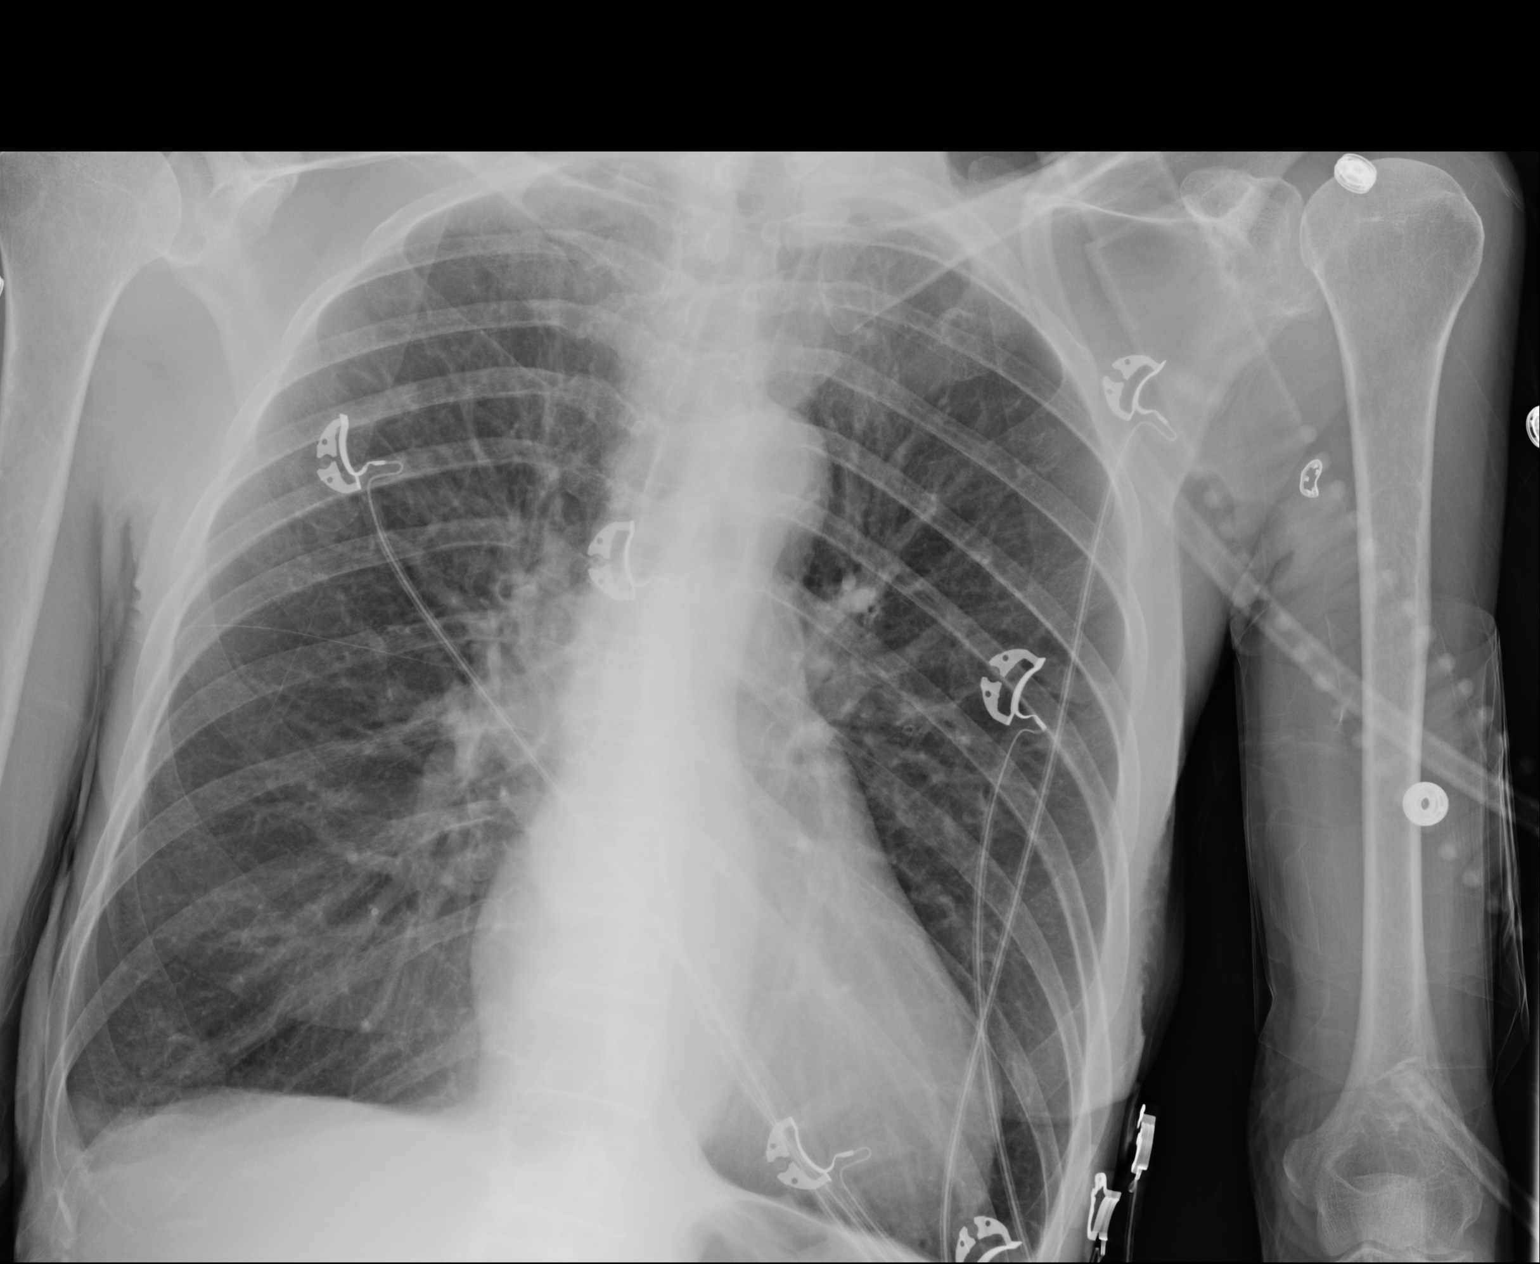
[im 2/2]
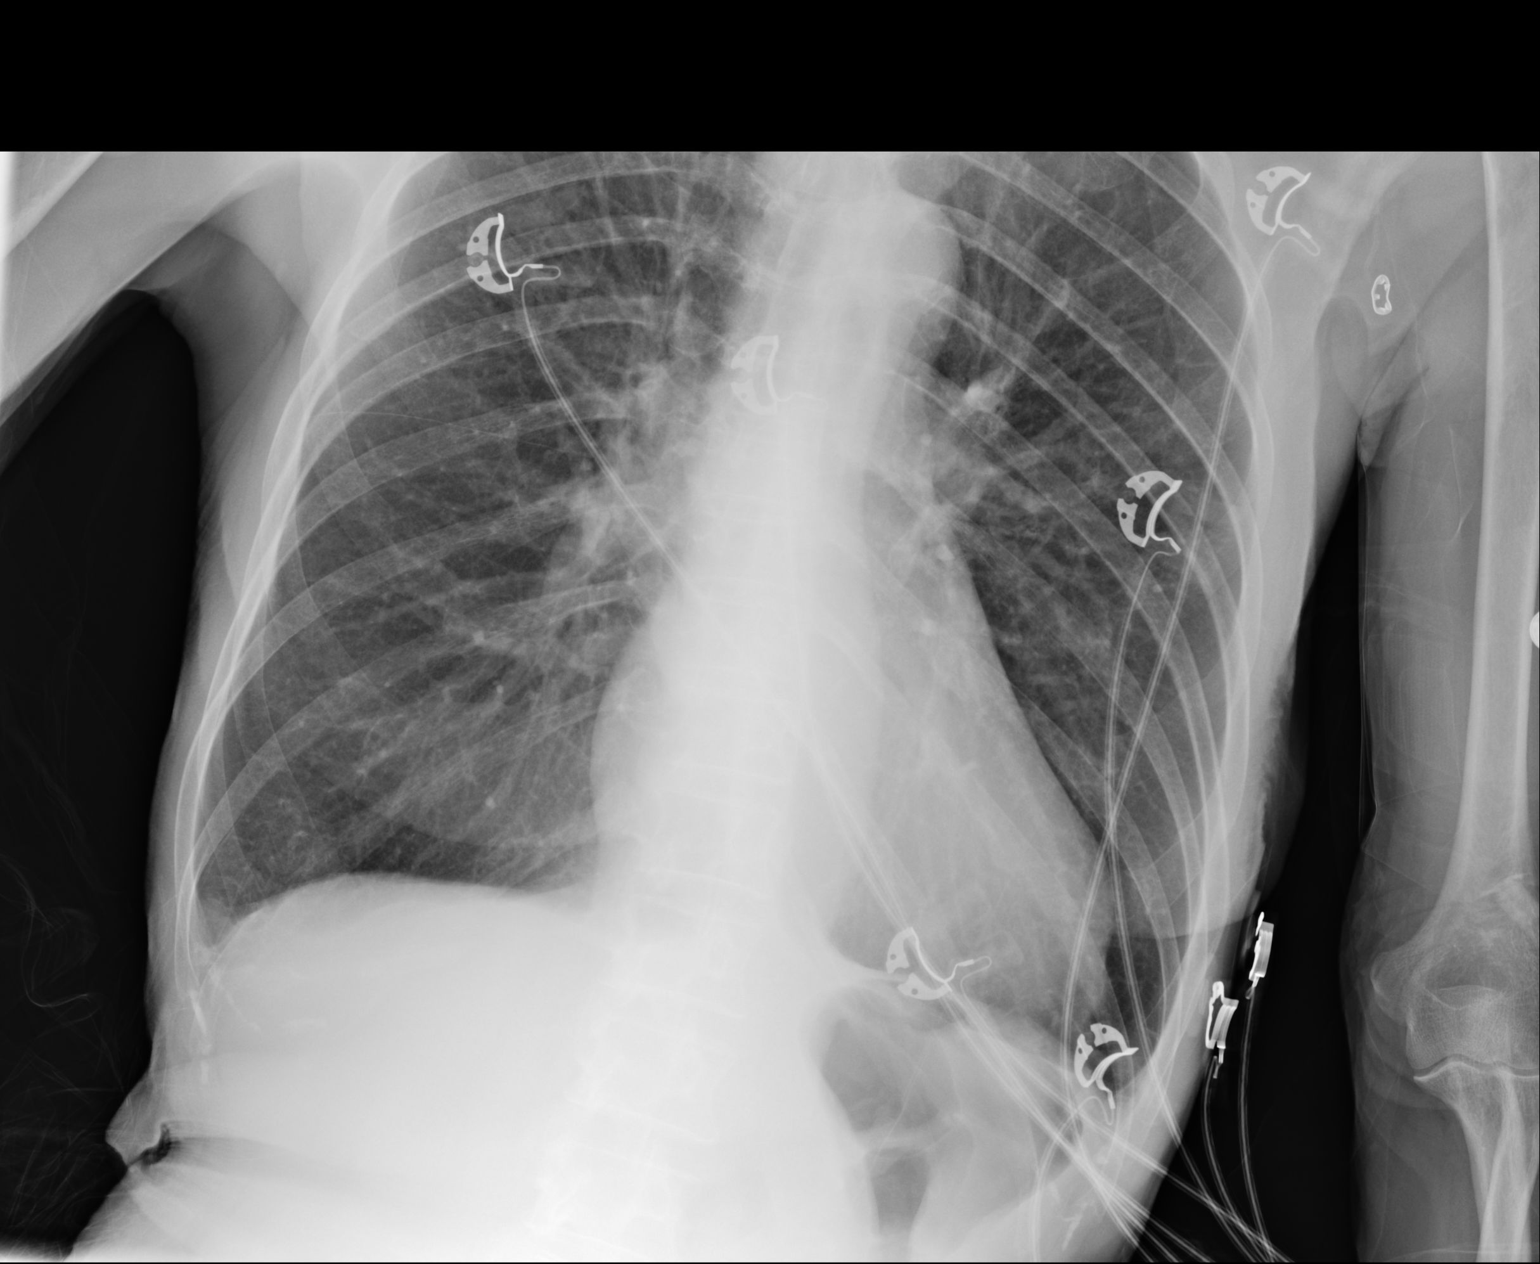

[2 of 2 positions shown; findings below may reference images not displayed]

FINDINGS: Airway thickening is present, suggesting bronchitis or reactive
airways disease. Tapering of the peripheral pulmonary vasculature
favors emphysema. Upper normal heart size. Atherosclerotic
calcification of the aortic arch. No appreciable bony abnormality.
IMPRESSION: 1. Airway thickening is present, suggesting bronchitis or reactive
airways disease.
2. Emphysema.
3.  Aortic Atherosclerosis (0K2D7-0H4.4).

## 2019-01-25 DIAGNOSIS — E46 Unspecified protein-calorie malnutrition: Secondary | ICD-10-CM | POA: Diagnosis not present

## 2019-01-25 DIAGNOSIS — D500 Iron deficiency anemia secondary to blood loss (chronic): Secondary | ICD-10-CM | POA: Diagnosis not present

## 2019-01-25 DIAGNOSIS — J449 Chronic obstructive pulmonary disease, unspecified: Secondary | ICD-10-CM | POA: Diagnosis not present

## 2019-01-25 DIAGNOSIS — E785 Hyperlipidemia, unspecified: Secondary | ICD-10-CM | POA: Diagnosis not present

## 2019-01-27 DIAGNOSIS — E785 Hyperlipidemia, unspecified: Secondary | ICD-10-CM | POA: Diagnosis not present

## 2019-01-27 DIAGNOSIS — J449 Chronic obstructive pulmonary disease, unspecified: Secondary | ICD-10-CM | POA: Diagnosis not present

## 2019-01-27 DIAGNOSIS — E46 Unspecified protein-calorie malnutrition: Secondary | ICD-10-CM | POA: Diagnosis not present

## 2019-01-27 DIAGNOSIS — D509 Iron deficiency anemia, unspecified: Secondary | ICD-10-CM | POA: Diagnosis not present

## 2019-01-27 LAB — TSH: TSH: 0.46 (ref 0.41–5.90)

## 2019-03-11 DIAGNOSIS — M25522 Pain in left elbow: Secondary | ICD-10-CM | POA: Diagnosis not present

## 2019-03-11 DIAGNOSIS — J449 Chronic obstructive pulmonary disease, unspecified: Secondary | ICD-10-CM | POA: Diagnosis not present

## 2019-03-11 DIAGNOSIS — F419 Anxiety disorder, unspecified: Secondary | ICD-10-CM | POA: Diagnosis not present

## 2019-03-11 DIAGNOSIS — D500 Iron deficiency anemia secondary to blood loss (chronic): Secondary | ICD-10-CM | POA: Diagnosis not present

## 2019-03-17 ENCOUNTER — Other Ambulatory Visit: Payer: Self-pay

## 2019-03-17 ENCOUNTER — Encounter (HOSPITAL_COMMUNITY): Payer: Self-pay | Admitting: Emergency Medicine

## 2019-03-17 ENCOUNTER — Inpatient Hospital Stay (HOSPITAL_COMMUNITY)
Admission: EM | Admit: 2019-03-17 | Discharge: 2019-03-19 | DRG: 190 | Disposition: A | Discharge status: Home / Self Care | Payer: Medicare Other | Attending: Pulmonary Disease | Admitting: Pulmonary Disease

## 2019-03-17 ENCOUNTER — Emergency Department (HOSPITAL_COMMUNITY): Payer: Medicare Other

## 2019-03-17 DIAGNOSIS — Z9119 Patient's noncompliance with other medical treatment and regimen: Secondary | ICD-10-CM | POA: Diagnosis not present

## 2019-03-17 DIAGNOSIS — Z79899 Other long term (current) drug therapy: Secondary | ICD-10-CM

## 2019-03-17 DIAGNOSIS — J209 Acute bronchitis, unspecified: Secondary | ICD-10-CM | POA: Diagnosis present

## 2019-03-17 DIAGNOSIS — K219 Gastro-esophageal reflux disease without esophagitis: Secondary | ICD-10-CM | POA: Diagnosis present

## 2019-03-17 DIAGNOSIS — J441 Chronic obstructive pulmonary disease with (acute) exacerbation: Secondary | ICD-10-CM | POA: Diagnosis not present

## 2019-03-17 DIAGNOSIS — J440 Chronic obstructive pulmonary disease with acute lower respiratory infection: Secondary | ICD-10-CM | POA: Diagnosis present

## 2019-03-17 DIAGNOSIS — Z7952 Long term (current) use of systemic steroids: Secondary | ICD-10-CM

## 2019-03-17 DIAGNOSIS — F17210 Nicotine dependence, cigarettes, uncomplicated: Secondary | ICD-10-CM | POA: Diagnosis not present

## 2019-03-17 DIAGNOSIS — R000 Tachycardia, unspecified: Secondary | ICD-10-CM | POA: Diagnosis not present

## 2019-03-17 DIAGNOSIS — Z8249 Family history of ischemic heart disease and other diseases of the circulatory system: Secondary | ICD-10-CM | POA: Diagnosis not present

## 2019-03-17 DIAGNOSIS — J9621 Acute and chronic respiratory failure with hypoxia: Secondary | ICD-10-CM | POA: Diagnosis present

## 2019-03-17 DIAGNOSIS — I10 Essential (primary) hypertension: Secondary | ICD-10-CM | POA: Diagnosis present

## 2019-03-17 DIAGNOSIS — D509 Iron deficiency anemia, unspecified: Secondary | ICD-10-CM | POA: Diagnosis present

## 2019-03-17 DIAGNOSIS — R0602 Shortness of breath: Secondary | ICD-10-CM | POA: Diagnosis not present

## 2019-03-17 LAB — COMPREHENSIVE METABOLIC PANEL
ALT: 19 U/L (ref 0–44)
AST: 34 U/L (ref 15–41)
Albumin: 4 g/dL (ref 3.5–5.0)
Alkaline Phosphatase: 88 U/L (ref 38–126)
Anion gap: 8 (ref 5–15)
BUN: 17 mg/dL (ref 8–23)
CALCIUM: 8.9 mg/dL (ref 8.9–10.3)
CO2: 24 mmol/L (ref 22–32)
CREATININE: 0.97 mg/dL (ref 0.44–1.00)
Chloride: 106 mmol/L (ref 98–111)
GFR calc non Af Amer: 57 mL/min — ABNORMAL LOW (ref >60)
Glucose, Bld: 126 mg/dL — ABNORMAL HIGH (ref 70–99)
Potassium: 4.2 mmol/L (ref 3.5–5.1)
Sodium: 138 mmol/L (ref 135–145)
Total Bilirubin: 0.2 mg/dL — ABNORMAL LOW (ref 0.3–1.2)
Total Protein: 6.9 g/dL (ref 6.5–8.1)

## 2019-03-17 LAB — CBC WITH DIFFERENTIAL/PLATELET
Abs Immature Granulocytes: 0.02 10*3/uL (ref 0.00–0.07)
BASOS ABS: 0.1 10*3/uL (ref 0.0–0.1)
Basophils Relative: 1 %
Eosinophils Absolute: 1 10*3/uL — ABNORMAL HIGH (ref 0.0–0.5)
Eosinophils Relative: 10 %
HCT: 30.9 % — ABNORMAL LOW (ref 36.0–46.0)
Hemoglobin: 8.9 g/dL — ABNORMAL LOW (ref 12.0–15.0)
Immature Granulocytes: 0 %
Lymphocytes Relative: 40 %
Lymphs Abs: 3.8 10*3/uL (ref 0.7–4.0)
MCH: 21.8 pg — ABNORMAL LOW (ref 26.0–34.0)
MCHC: 28.8 g/dL — ABNORMAL LOW (ref 30.0–36.0)
MCV: 75.6 fL — ABNORMAL LOW (ref 80.0–100.0)
Monocytes Absolute: 1.4 10*3/uL — ABNORMAL HIGH (ref 0.1–1.0)
Monocytes Relative: 15 %
NRBC: 0 % (ref 0.0–0.2)
Neutro Abs: 3.2 10*3/uL (ref 1.7–7.7)
Neutrophils Relative %: 34 %
Platelets: 362 10*3/uL (ref 150–400)
RBC: 4.09 MIL/uL (ref 3.87–5.11)
RDW: 17 % — ABNORMAL HIGH (ref 11.5–15.5)
WBC: 9.4 10*3/uL (ref 4.0–10.5)

## 2019-03-17 LAB — TROPONIN I: Troponin I: <0.03 ng/mL (ref <0.03)

## 2019-03-17 MED ORDER — IPRATROPIUM-ALBUTEROL 0.5-2.5 (3) MG/3ML IN SOLN
3.0000 mL | Freq: Once | RESPIRATORY_TRACT | Status: AC
  Administered 2019-03-17: 3 mL via RESPIRATORY_TRACT
  Filled 2019-03-17: qty 3

## 2019-03-17 MED ORDER — SODIUM CHLORIDE 0.9 % IV BOLUS
500.0000 mL | Freq: Once | INTRAVENOUS | Status: AC
  Administered 2019-03-17: 500 mL via INTRAVENOUS

## 2019-03-17 MED ORDER — METHYLPREDNISOLONE SODIUM SUCC 125 MG IJ SOLR
125.0000 mg | Freq: Once | INTRAMUSCULAR | Status: AC
  Administered 2019-03-17: 125 mg via INTRAVENOUS
  Filled 2019-03-17: qty 2

## 2019-03-17 MED ORDER — MAGNESIUM SULFATE 2 GM/50ML IV SOLN
2.0000 g | Freq: Once | INTRAVENOUS | Status: AC
  Administered 2019-03-17: 2 g via INTRAVENOUS
  Filled 2019-03-17: qty 50

## 2019-03-17 NOTE — ED Notes (Signed)
Pt now states to provider that she has been short of breath for a couple days.l

## 2019-03-17 NOTE — H&P (Signed)
History and Physical    Katelyn [name] HMC:[id number] DOB: [birth year] DOA: 03/17/2019  PCP: Sinda Du, MD   Patient coming from: Home.  I have personally briefly reviewed patient's old medical records in [place]  Chief Complaint: Shortness of breath.  HPI: Katelyn [name] is a 78 y.o. female with medical history significant of asthma, chronic neck pain, chronic left elbow pain, COPD, dysphagia, headache, irregular heartbeat, history of chronic gastritis with bulbar erosions who is coming to the emergency department due to progressively worse dyspnea with dry cough since yesterday typical of her COPD exacerbation. She denies fever, chills, sore throat, smell or taste changes, body aches, arthralgias, travel history or sick contacts.  No hemoptysis, chest pain, palpitations, dizziness, diaphoresis, lower extremity edema, abdominal pain, nausea or vomiting, diarrhea or constipation, melena or hematochezia.  No dysuria, frequency or hematuria.  Denies polyuria, polydipsia, polyphagia or blurred vision.  ED Course: Initial vital signs temperature 96.8 F, pulse 114, respirations 28, blood pressure 158/100 mmHg and O2 sat 100% on room air.  The patient received supplemental oxygen, 2 DuoNebs, Solu-Medrol 125 mg x 1 and was started on BiPAP ventilation.  I ordered magnesium sulfate 2 g IVPB.  White count was 9.4, hemoglobin 8.9 g/dL and platelets 362.  CMP was normal, except for glucose of 126 mg/dL.  Troponin was less than 0.3 ng/mL.her chest radiograph showed increased bronchial thickening from prior exam suggesting acute bronchitis/COPD exacerbation.  Review of Systems: As per HPI otherwise 10 point review of systems negative.   Past Medical History:  Diagnosis Date  . Asthma   . Chronic neck pain   . Chronic pain of left elbow   . Complication of anesthesia    hard to wake  . COPD (chronic obstructive pulmonary disease) ([place])   . Dysphagia    requiring multiple dilations in past   . Headache    Patient states she needs  eyeglasses  . Irregular heart beat   . S/P colonoscopy 2009   dr. Gala Romney: anal papilla, 2 cecal AVMs. Due in 2014  . S/P endoscopy 2012   Dr. Gala Romney: mild chronic gastritis, bulbar erosions, s/p 54-French Maloney dilation   . S/P endoscopy 2003, 2004, 2007   gastritis, duodenitis, s/p dilation, +H.pylori     Past Surgical History:  Procedure Laterality Date  . APPENDECTOMY    . BIOPSY  03/30/2018   Procedure: BIOPSY;  Surgeon: Daneil Dolin, MD;  [facility];  Service: Gastroenterology;;  gastric  . CHOLECYSTECTOMY    . COLONOSCOPY  10/10/2008   RMR: 1. Anal papilla, otherwise normal rectum. 2. Two cecal arteriovenous malformations, colonic mucosa apperaed normal.   . COLONOSCOPY N/A 05/10/2015   RMR: Rectal and colonic polyps removed as described above. Ascending colon AVMs . Rectal polyps were tubular adenomas. Next colonoscopy May 2021.  . ESOPHAGEAL DILATION N/A 05/10/2015   Procedure: ESOPHAGEAL DILATION;  Surgeon: Daneil Dolin, MD;  [facility];  Service: Endoscopy;  Laterality: N/A;  . ESOPHAGOGASTRODUODENOSCOPY  04/02/2011   RMR: 1. Endoscopically normal -appearing esophagus status post passage of a Maloney dilator followed by biopsy. 2. Hiatal hernia, Mottling, and submucosal petechiae in teh gastric mucoas of uncertain significance status post biopsy. 3. Bulbar erosions as described above.   . ESOPHAGOGASTRODUODENOSCOPY N/A 05/10/2015   RMR: Normal esophagus status post Maloney dilation. Small hiatal hernia. Abnormal gastric because of doubtful clinical significance status post biopsy (benign)  . ESOPHAGOGASTRODUODENOSCOPY (EGD) WITH PROPOFOL N/A 03/30/2018  Procedure: ESOPHAGOGASTRODUODENOSCOPY (EGD) WITH PROPOFOL;  Surgeon: Daneil Dolin, MD;  [facility];  Service: Gastroenterology;  Laterality: N/A;  WITH DILATION   . INGUINAL HERNIA REPAIR    . MALONEY DILATION  03/30/2018   Procedure: Venia Minks  DILATION;  Surgeon: Daneil Dolin, MD;  [facility];  Service: Gastroenterology;;  . TONSILLECTOMY    . TOTAL ABDOMINAL HYSTERECTOMY       reports that she has been smoking cigarettes. She started smoking about 59 years ago. She has a 57.00 pack-year smoking history. She has never used smokeless tobacco. She reports that she does not drink alcohol or use drugs.  No Known Allergies  Family History  Problem Relation Age of Onset  . Heart disease Mother   . Lung cancer Father   . Cancer Sister   . Diabetes Brother   . Colon cancer Neg Hx    Prior to Admission medications   Medication Sig Start Date End Date Taking? Authorizing Provider  albuterol (PROVENTIL HFA;VENTOLIN HFA) 108 (90 BASE) MCG/ACT inhaler Inhale 2 puffs into the lungs every 6 (six) hours as needed for wheezing or shortness of breath. 09/04/15   [name], MD  albuterol (PROVENTIL) (2.5 MG/3ML) 0.083% nebulizer solution Take 2.5 mg by nebulization every 4 (four) hours as needed for shortness of breath.    [provider]  Multiple Vitamins-Minerals (MULTIVITAMIN ADULT) CHEW Chew 2 each by mouth daily. Vitafusion gummy    [provider]  predniSONE (DELTASONE) 10 MG tablet Take 2 tablets (20 mg total) by mouth daily. 11/06/18   Milton Ferguson, MD  roflumilast (DALIRESP) 500 MCG TABS tablet Take 0.5 tablets (250 mcg total) by mouth daily. 07/05/18   Sinda Du, MD  Tiotropium Bromide-Olodaterol (STIOLTO RESPIMAT) 2.5-2.5 MCG/ACT AERS Inhale 2 Inhalers into the lungs daily.     [provider]    Physical Exam: Vitals:   03/17/19 2254 03/17/19 2300 03/17/19 2302 03/17/19 2330  BP:  139/74    Pulse: 80 85 87 80  Resp: (!) 21 (!) 22 (!) 21 18  SpO2: 100% 100% 100% 100%  Weight:      Height:        Constitutional: NAD, calm, comfortable Eyes: PERRL, lids and conjunctivae normal ENMT: Mucous membranes are moist. Posterior pharynx clear of any exudate or lesions. Neck:  normal, supple, no masses, no thyromegaly Respiratory: Good air movement.  Minimal wheezing, no rhonchi or crackles. Normal respiratory effort. No accessory muscle use.  Cardiovascular: Regular rate and rhythm, no murmurs / rubs / gallops. No extremity edema. 2+ pedal pulses. No carotid bruits.  Abdomen: Soft, no tenderness, no masses palpated. No hepatosplenomegaly. Bowel sounds positive.  Musculoskeletal: no clubbing / cyanosis. Good ROM, no contractures. Normal muscle tone.  Skin: no rashes, lesions, ulcers on limited dermatological examination. Neurologic: CN 2-12 grossly intact. Sensation intact, DTR normal. Strength 5/5 in all 4.  Psychiatric: Normal judgment and insight. Alert and oriented x 3. Normal mood.   Labs on Admission: I have personally reviewed following labs and imaging studies  CBC: Recent Labs  Lab 03/17/19 2150  WBC 9.4  NEUTROABS 3.2  HGB 8.9*  HCT 30.9*  MCV 75.6*  PLT 203   Basic Metabolic Panel: Recent Labs  Lab 03/17/19 2150  NA 138  K 4.2  CL 106  CO2 24  GLUCOSE 126*  BUN 17  CREATININE 0.97  CALCIUM 8.9   GFR: Estimated Creatinine Clearance: 38.8 mL/min (by C-G formula based on SCr  of 0.97 mg/dL). Liver Function Tests: Recent Labs  Lab 03/17/19 2150  AST 34  ALT 19  ALKPHOS 88  BILITOT 0.2*  PROT 6.9  ALBUMIN 4.0   No results for input(s): LIPASE, AMYLASE in the last 168 hours. No results for input(s): AMMONIA in the last 168 hours. Coagulation Profile: No results for input(s): INR, PROTIME in the last 168 hours. Cardiac Enzymes: Recent Labs  Lab 03/17/19 2150  TROPONINI <0.03   BNP (last 3 results) No results for input(s): PROBNP in the last 8760 hours. HbA1C: No results for input(s): HGBA1C in the last 72 hours. CBG: No results for input(s): GLUCAP in the last 168 hours. Lipid Profile: No results for input(s): CHOL, HDL, LDLCALC, TRIG, CHOLHDL, LDLDIRECT in the last 72 hours. Thyroid Function Tests: No results for  input(s): TSH, T4TOTAL, FREET4, T3FREE, THYROIDAB in the last 72 hours. Anemia Panel: No results for input(s): VITAMINB12, FOLATE, FERRITIN, TIBC, IRON, RETICCTPCT in the last 72 hours. Urine analysis: No results found for: COLORURINE, APPEARANCEUR, LABSPEC, [place], GLUCOSEU, HGBUR, BILIRUBINUR, KETONESUR, PROTEINUR, UROBILINOGEN, NITRITE, LEUKOCYTESUR  Radiological Exams on Admission: Dg Chest Portable 1 View  Result Date: 03/17/2019 CLINICAL DATA:  Shortness of breath and wheezing. EXAM: PORTABLE CHEST 1 VIEW COMPARISON:  Radiograph 11/06/2018. Chest CT 11/06/2018 FINDINGS: Chronic hyperinflation. Increased bronchial thickening from prior exam. Normal heart size and mediastinal contours. No focal airspace disease, pleural effusion, or pneumothorax. No acute osseous abnormalities. IMPRESSION: Increased bronchial thickening from prior exam suggesting acute bronchitis/COPD exacerbation. Electronically Signed   By: Keith Rake M.D.   On: 03/17/2019 22:14    EKG: Independently reviewed.  Vent. rate 113 BPM  PR interval * ms QRS duration 88 ms QT/QTc 327/449 ms P-R-T axes 83 73 74 Sinus tachycardia Ventricular premature complex Aberrant conduction of SV complex(es) RSR' in V1 or V2, right VCD or RVH  Assessment/Plan Principal Problem:   Acute on chronic respiratory failure with hypoxia (HCC)   COPD exacerbation ([place]) Has improved significantly since coming to the ED. Observation/stepdown. Continue supplemental oxygen. Continue BiPAP ventilation mode. DuoNeb every 6 hours. Hold glucocorticoids for now. Suspicions for COVID-19 virus is very low. Goal is to improve overnight, check RVP, discharge home in a.m. The patient is to stay home, check her temperature and symptoms closely. Return to ED if symptoms recur or she develops a fever/sore throat/etc.  Active Problems:   GERD (gastroesophageal reflux disease) Famotidine 20 mg p.o. twice daily.    Essential hypertension On  lifestyle modifications only. Monitor blood pressure.   DVT prophylaxis: Lovenox SQ. Code Status: Full code. Family Communication: Disposition Plan: Observation for BiPAP ventilation COPD exacerbation treatment. Consults called: Admission status: Observation/stepdown.   Reubin Milan MD [facility]  03/17/2019, 11:52 PM   This document was prepared using Dragon voice recognition software and may contain some unintended transcription errors.

## 2019-03-17 NOTE — ED Provider Notes (Signed)
Emergency Department Provider Note   I have reviewed the triage vital signs and the nursing notes.   HISTORY  Chief Complaint Shortness of Breath   HPI Katelyn [name] is a 78 y.o. female with PMH of asthma, COPD, and non-compliance with home O2 presents to the emergency department for evaluation of shortness of breath with wheezing.  Patient states symptoms began today.  He denies any fevers, chills, cough.  Patient arrived with her son and was not wearing oxygen.  She denies any abdominal pain, vomiting, diarrhea.  Reports using her home medications with no significant relief.  Level 5 caveat: Respiratory Distress.    Past Medical History:  Diagnosis Date  . Asthma   . Chronic neck pain   . Chronic pain of left elbow   . Complication of anesthesia    hard to wake  . COPD (chronic obstructive pulmonary disease) ([place])   . Dysphagia    requiring multiple dilations in past  . Headache    Patient states she needs  eyeglasses  . Irregular heart beat   . S/P colonoscopy 2009   dr. Gala Romney: anal papilla, 2 cecal AVMs. Due in 2014  . S/P endoscopy 2012   Dr. Gala Romney: mild chronic gastritis, bulbar erosions, s/p 54-French Maloney dilation   . S/P endoscopy 2003, 2004, 2007   gastritis, duodenitis, s/p dilation, +H.pylori     Patient Active Problem List   Diagnosis Date Noted  . Acute on chronic respiratory failure with hypoxia ([place]) 03/17/2019  . IDA (iron deficiency anemia) 07/04/2018  . Acute metabolic encephalopathy 29/93/7169  . Acute respiratory failure with hypoxia and hypercapnia ([place]) 07/02/2018  . GERD (gastroesophageal reflux disease) 06/13/2018  . Malnutrition ([place]) 06/13/2018  . Essential hypertension 06/13/2018  . Tachycardia 04/14/2018  . Acute gastritis 03/31/2018  . Esophagitis 03/31/2018  . Duodenal ulcer 03/31/2018  . Goals of care, counseling/discussion   . DNR (do not resuscitate) discussion   . Palliative care by specialist   . COPD with acute  exacerbation ([place]) 03/29/2018  . COPD (chronic obstructive pulmonary disease) ([place]) 02/03/2018  . Hypoxia 11/28/2015  . COPD exacerbation ([place]) 11/28/2015  . Acute respiratory failure with hypoxia ([place]) 11/28/2015  . SOB (shortness of breath) 11/28/2015  . Mucosal abnormality of stomach   . History of colonic polyps 04/25/2015  . 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 Dysphagia 03/11/2011    Past Surgical History:  Procedure Laterality Date  . APPENDECTOMY    . BIOPSY  03/30/2018   Procedure: BIOPSY;  Surgeon: Daneil Dolin, MD;  [facility];  Service: Gastroenterology;;  gastric  . CHOLECYSTECTOMY    . COLONOSCOPY  10/10/2008   RMR: 1. Anal papilla, otherwise normal rectum. 2. Two cecal arteriovenous malformations, colonic mucosa apperaed normal.   . COLONOSCOPY N/A 05/10/2015   RMR: Rectal and colonic polyps removed as described above. Ascending colon AVMs . Rectal polyps were tubular adenomas. Next colonoscopy May 2021.  . ESOPHAGEAL DILATION N/A 05/10/2015   Procedure: ESOPHAGEAL DILATION;  Surgeon: Daneil Dolin, MD;  [facility];  Service: Endoscopy;  Laterality: N/A;  . ESOPHAGOGASTRODUODENOSCOPY  04/02/2011   RMR: 1. Endoscopically normal -appearing esophagus status post passage of a Maloney dilator followed by biopsy. 2. Hiatal hernia, Mottling, and submucosal petechiae in teh gastric mucoas of uncertain significance status post biopsy. 3. Bulbar erosions as described above.   . ESOPHAGOGASTRODUODENOSCOPY N/A 05/10/2015   RMR: Normal esophagus status post Maloney dilation. Small hiatal hernia. Abnormal gastric because  of doubtful clinical significance status post biopsy (benign)  . ESOPHAGOGASTRODUODENOSCOPY (EGD) WITH PROPOFOL N/A 03/30/2018   Procedure: ESOPHAGOGASTRODUODENOSCOPY (EGD) WITH PROPOFOL;  Surgeon: Daneil Dolin, MD;  [facility];  Service: Gastroenterology;  Laterality: N/A;  WITH DILATION   . INGUINAL HERNIA REPAIR    . MALONEY  DILATION  03/30/2018   Procedure: Venia Minks DILATION;  Surgeon: Daneil Dolin, MD;  [facility];  Service: Gastroenterology;;  . TONSILLECTOMY    . TOTAL ABDOMINAL HYSTERECTOMY     Allergies Patient has no known allergies.  Family History  Problem Relation Age of Onset  . Heart disease Mother   . Lung cancer Father   . Cancer Sister   . Diabetes Brother   . Colon cancer Neg Hx     Social History Social History   Tobacco Use  . Smoking status: Current Every Day Smoker    Packs/day: 1.00    Years: 57.00    Pack years: 57.00    Types: Cigarettes    Start date: 08/18/1959  . Smokeless tobacco: Never Used  Substance Use Topics  . Alcohol use: No    Alcohol/week: 0.0 standard drinks  . Drug use: No    Review of Systems  Constitutional: No fever/chills Eyes: No visual changes. ENT: No sore throat. Cardiovascular: Denies chest pain. Respiratory: Positive shortness of breath. Gastrointestinal: No abdominal pain.  No nausea, no vomiting.  No diarrhea.  No constipation. Genitourinary: Negative for dysuria. Musculoskeletal: Negative for back pain. Skin: Negative for rash. Neurological: Negative for headaches, focal weakness or numbness.  10-point ROS otherwise negative.  ____________________________________________   PHYSICAL EXAM:  VITAL SIGNS: ED Triage Vitals  Enc Vitals Group     BP 03/17/19 2146 (!) 158/100     Pulse Rate 03/17/19 2145 (!) 114     Resp 03/17/19 2146 (!) 28     Temp --      Temp src --      SpO2 03/17/19 2145 100 %     Weight 03/17/19 2143 108 lb (49 kg)     Height 03/17/19 2143 5\' 4"  (1.626 m)   Constitutional: Alert and oriented. Patient dyspneic and appears unwell.  Eyes: Conjunctivae are normal. Head: Atraumatic. Nose: No congestion/rhinnorhea. Mouth/Throat: Mucous membranes are moist. Neck: No stridor.  Cardiovascular: Tachycardia. Good peripheral circulation. Grossly normal heart sounds.   Respiratory: Increased  respiratory effort.  No retractions. Lungs with diffuse inspiratory and expiratory wheezing.  Gastrointestinal: Soft and nontender. No distention.  Musculoskeletal: No lower extremity tenderness nor edema. No gross deformities of extremities. Neurologic:  Normal speech and language. No gross focal neurologic deficits are appreciated.  Skin:  Skin is warm, dry and intact. No rash noted.  ____________________________________________   LABS (all labs ordered are listed, but only abnormal results are displayed)  Labs Reviewed  COMPREHENSIVE METABOLIC PANEL - Abnormal; Notable for the following components:      Result Value   Glucose, Bld 126 (*)    Total Bilirubin 0.2 (*)    GFR calc non Af Amer 57 (*)    All other components within normal limits  CBC WITH DIFFERENTIAL/PLATELET - Abnormal; Notable for the following components:   Hemoglobin 8.9 (*)    HCT 30.9 (*)    MCV 75.6 (*)    MCH 21.8 (*)    MCHC 28.8 (*)    RDW 17.0 (*)    Monocytes Absolute 1.4 (*)    Eosinophils Absolute 1.0 (*)    All  other components within normal limits  TROPONIN I   ____________________________________________  EKG   EKG Interpretation  Date/Time:  Wednesday March 17 2019 21:48:24 EDT Ventricular Rate:  113 PR Interval:    QRS Duration: 88 QT Interval:  327 QTC Calculation: 449 R Axis:   73 Text Interpretation:  Sinus tachycardia Ventricular premature complex Aberrant conduction of SV complex(es) RSR' in V1 or V2, right VCD or RVH No STEMI.  Confirmed by Nanda Quinton [phone]) on 03/17/2019 11:10:56 PM       ____________________________________________  RADIOLOGY  Dg Chest Portable 1 View  Result Date: 03/17/2019 CLINICAL DATA:  Shortness of breath and wheezing. EXAM: PORTABLE CHEST 1 VIEW COMPARISON:  Radiograph 11/06/2018. Chest CT 11/06/2018 FINDINGS: Chronic hyperinflation. Increased bronchial thickening from prior exam. Normal heart size and mediastinal contours. No focal airspace  disease, pleural effusion, or pneumothorax. No acute osseous abnormalities. IMPRESSION: Increased bronchial thickening from prior exam suggesting acute bronchitis/COPD exacerbation. Electronically Signed   By: Keith Rake M.D.   On: 03/17/2019 22:14    ____________________________________________   PROCEDURES  Procedure(s) performed:   Procedures  CRITICAL CARE Performed by: Margette Fast Total critical care time: 35 minutes Critical care time was exclusive of separately billable procedures and treating other patients. Critical care was necessary to treat or prevent imminent or life-threatening deterioration. Critical care was time spent personally by me on the following activities: development of treatment plan with patient and/or surrogate as well as nursing, discussions with consultants, evaluation of patient's response to treatment, examination of patient, obtaining history from patient or surrogate, ordering and performing treatments and interventions, ordering and review of laboratory studies, ordering and review of radiographic studies, pulse oximetry and re-evaluation of patient's condition.  Nanda Quinton, MD Emergency Medicine  ____________________________________________   INITIAL IMPRESSION / ASSESSMENT AND PLAN / ED COURSE  Pertinent labs & imaging results that were available during my care of the patient were reviewed by me and considered in my medical decision making (see chart for details).  Patient arrives by private vehicle with respiratory distress.  No fever or flulike symptoms.  Low COVID risk.  Patient is a good candidate for nebs and BiPAP.  She has multiple prior ED evaluations but last admitted in October 2019.  PCP is Dr. Luan Pulling.  Patient initially given nonrebreather for comfort and immediately transferred to BiPAP.  Tolerating well initially.  Following labs, EKG, will reassess.   10:30 PM  Patient looking significantly better on BiPAP.  She is awake  and alert.  Vital signs have normalized.  No symptoms to suspect infectious process. Low COVID risk. Will admit to continue BiPAP and nebs overnight. EKG with no ischemia.   Discussed patient's case with Hospitalist, Dr. Olevia Bowens to request admission. Patient and family (if present) updated with plan. Care transferred to Hospitalist service.  I reviewed all nursing notes, vitals, pertinent old records, EKGs, labs, imaging (as available).  ____________________________________________  FINAL CLINICAL IMPRESSION(S) / ED DIAGNOSES  Final diagnoses:  COPD exacerbation ([place])    MEDICATIONS GIVEN DURING THIS VISIT:  Medications  magnesium sulfate IVPB 2 g 50 mL (has no administration in time range)  sodium chloride 0.9 % bolus 500 mL (500 mLs Intravenous New Bag/Given 03/17/19 2216)  ipratropium-albuterol (DUONEB) 0.5-2.5 (3) MG/3ML nebulizer solution 3 mL (3 mLs Nebulization Given 03/17/19 2205)  ipratropium-albuterol (DUONEB) 0.5-2.5 (3) MG/3ML nebulizer solution 3 mL (3 mLs Nebulization Given 03/17/19 2205)  methylPREDNISolone sodium succinate (SOLU-MEDROL) 125 mg/2 mL injection 125 mg (125 mg Intravenous Given  03/17/19 2241)    Note:  This document was prepared using Dragon voice recognition software and may include unintentional dictation errors.  Nanda Quinton, MD Emergency Medicine    Long, [facility], MD 03/17/19 [phone]

## 2019-03-17 NOTE — ED Triage Notes (Signed)
Pt complaint of shortness of breath that started today. Pt has COPD. Pt came in without oxygen.

## 2019-03-18 DIAGNOSIS — Z79899 Other long term (current) drug therapy: Secondary | ICD-10-CM | POA: Diagnosis not present

## 2019-03-18 DIAGNOSIS — Z7952 Long term (current) use of systemic steroids: Secondary | ICD-10-CM | POA: Diagnosis not present

## 2019-03-18 DIAGNOSIS — I10 Essential (primary) hypertension: Secondary | ICD-10-CM | POA: Diagnosis present

## 2019-03-18 DIAGNOSIS — K219 Gastro-esophageal reflux disease without esophagitis: Secondary | ICD-10-CM | POA: Diagnosis present

## 2019-03-18 DIAGNOSIS — J441 Chronic obstructive pulmonary disease with (acute) exacerbation: Secondary | ICD-10-CM | POA: Diagnosis present

## 2019-03-18 DIAGNOSIS — J440 Chronic obstructive pulmonary disease with acute lower respiratory infection: Secondary | ICD-10-CM | POA: Diagnosis present

## 2019-03-18 DIAGNOSIS — J9621 Acute and chronic respiratory failure with hypoxia: Secondary | ICD-10-CM | POA: Diagnosis not present

## 2019-03-18 DIAGNOSIS — D509 Iron deficiency anemia, unspecified: Secondary | ICD-10-CM | POA: Diagnosis present

## 2019-03-18 DIAGNOSIS — Z8249 Family history of ischemic heart disease and other diseases of the circulatory system: Secondary | ICD-10-CM | POA: Diagnosis not present

## 2019-03-18 DIAGNOSIS — Z9119 Patient's noncompliance with other medical treatment and regimen: Secondary | ICD-10-CM | POA: Diagnosis not present

## 2019-03-18 DIAGNOSIS — J209 Acute bronchitis, unspecified: Secondary | ICD-10-CM | POA: Diagnosis present

## 2019-03-18 DIAGNOSIS — F17210 Nicotine dependence, cigarettes, uncomplicated: Secondary | ICD-10-CM | POA: Diagnosis present

## 2019-03-18 LAB — RESPIRATORY PANEL BY PCR

## 2019-03-18 LAB — INFLUENZA PANEL BY PCR (TYPE A & B)
INFLAPCR: NEGATIVE
Influenza B By PCR: NEGATIVE

## 2019-03-18 LAB — MRSA PCR SCREENING: MRSA by PCR: NEGATIVE

## 2019-03-18 MED ORDER — ENOXAPARIN SODIUM 40 MG/0.4ML ~~LOC~~ SOLN
40.0000 mg | SUBCUTANEOUS | Status: DC
  Administered 2019-03-18: 40 mg via SUBCUTANEOUS
  Filled 2019-03-18: qty 0.4

## 2019-03-18 MED ORDER — ACETAMINOPHEN 325 MG PO TABS: 650.0000 mg | ORAL_TABLET | Freq: Four times a day (QID) | ORAL | Status: DC | PRN

## 2019-03-18 MED ORDER — METHYLPREDNISOLONE SODIUM SUCC 40 MG IJ SOLR
40.0000 mg | Freq: Two times a day (BID) | INTRAMUSCULAR | Status: DC
  Administered 2019-03-18 (×2): 40 mg via INTRAVENOUS
  Filled 2019-03-18 (×2): qty 1

## 2019-03-18 MED ORDER — IPRATROPIUM-ALBUTEROL 0.5-2.5 (3) MG/3ML IN SOLN
3.0000 mL | Freq: Four times a day (QID) | RESPIRATORY_TRACT | Status: DC
  Administered 2019-03-18 – 2019-03-19 (×5): 3 mL via RESPIRATORY_TRACT
  Filled 2019-03-18 (×5): qty 3

## 2019-03-18 MED ORDER — IPRATROPIUM BROMIDE 0.02 % IN SOLN: 0.5000 mg | Freq: Four times a day (QID) | RESPIRATORY_TRACT | Status: DC

## 2019-03-18 MED ORDER — GUAIFENESIN ER 600 MG PO TB12
1200.0000 mg | ORAL_TABLET | Freq: Two times a day (BID) | ORAL | Status: DC
  Administered 2019-03-18 – 2019-03-19 (×3): 1200 mg via ORAL
  Filled 2019-03-18 (×3): qty 2

## 2019-03-18 MED ORDER — CHLORHEXIDINE GLUCONATE 0.12 % MT SOLN
15.0000 mL | Freq: Two times a day (BID) | OROMUCOSAL | Status: DC
  Administered 2019-03-18 (×2): 15 mL via OROMUCOSAL
  Filled 2019-03-18 (×2): qty 15

## 2019-03-18 MED ORDER — SODIUM CHLORIDE 0.9 % IV SOLN
1.0000 g | INTRAVENOUS | Status: DC
  Administered 2019-03-18: 1 g via INTRAVENOUS
  Filled 2019-03-18: qty 10

## 2019-03-18 MED ORDER — ENOXAPARIN SODIUM 30 MG/0.3ML ~~LOC~~ SOLN: 30.0000 mg | SUBCUTANEOUS | Status: DC

## 2019-03-18 MED ORDER — ALBUTEROL SULFATE (2.5 MG/3ML) 0.083% IN NEBU: 2.5000 mg | INHALATION_SOLUTION | Freq: Four times a day (QID) | RESPIRATORY_TRACT | Status: DC

## 2019-03-18 MED ORDER — ACETAMINOPHEN 650 MG RE SUPP: 650.0000 mg | Freq: Four times a day (QID) | RECTAL | Status: DC | PRN

## 2019-03-18 MED ORDER — ENOXAPARIN SODIUM 40 MG/0.4ML ~~LOC~~ SOLN: 40.0000 mg | SUBCUTANEOUS | Status: DC

## 2019-03-18 MED ORDER — ENOXAPARIN SODIUM 40 MG/0.4ML ~~LOC~~ SOLN
40.0000 mg | SUBCUTANEOUS | Status: DC
  Administered 2019-03-19: 40 mg via SUBCUTANEOUS
  Filled 2019-03-18: qty 0.4

## 2019-03-18 MED ORDER — ORAL CARE MOUTH RINSE: 15.0000 mL | Freq: Two times a day (BID) | OROMUCOSAL | Status: DC

## 2019-03-18 MED ORDER — ALBUTEROL SULFATE (2.5 MG/3ML) 0.083% IN NEBU: 2.5000 mg | INHALATION_SOLUTION | RESPIRATORY_TRACT | Status: DC | PRN

## 2019-03-18 MED ORDER — FERROUS SULFATE 325 (65 FE) MG PO TABS
325.0000 mg | ORAL_TABLET | Freq: Every day | ORAL | Status: DC
  Administered 2019-03-18 – 2019-03-19 (×2): 325 mg via ORAL
  Filled 2019-03-18 (×2): qty 1

## 2019-03-18 NOTE — Progress Notes (Signed)
Patient moved from ED to ICU 8 without any complications.

## 2019-03-18 NOTE — Progress Notes (Signed)
Subjective: She was admitted with COPD exacerbation.  She required BiPAP overnight and she is on nasal cannula now.  She is oxygenating well.  She says she feels better.  She is still more short of breath than usual.  She is coughing and says she has trouble producing sputum but she feels like she has thick sputum in the back of her throat.  Objective: Vital signs in last 24 hours: Temp:  [96.8 F (36 C)-97.5 F (36.4 C)] 97.5 F (36.4 C) (03/26 0438) Pulse Rate:  [71-114] 71 (03/26 0600) Resp:  [16-28] 17 (03/26 0600) BP: (105-158)/(58-100) 105/58 (03/26 0600) SpO2:  [100 %] 100 % (03/26 0600) FiO2 (%):  [40 %] 40 % (03/26 0213) Weight:  [48.2 kg-49 kg] 48.2 kg (03/26 0438) Weight change:  Last BM Date: 03/17/19  Intake/Output from previous day: 03/25 0701 - 03/26 0700 In: [place] [IV Piggyback:544] Out: -   PHYSICAL EXAM General appearance: alert, cooperative and She looks mildly short of breath and has increased respiratory effort at rest Resp: She has bilateral wheezes more on the right than on the left Cardio: regular rate and rhythm, S1, S2 normal, no murmur, click, rub or gallop GI: soft, non-tender; bowel sounds normal; no masses,  no organomegaly Extremities: extremities normal, atraumatic, no cyanosis or edema  Lab Results:  Results for orders placed or performed during the hospital encounter of 03/17/19 (from the past 48 hour(s))  Comprehensive metabolic panel     Status: Abnormal   Collection Time: 03/17/19  9:50 PM  Result Value Ref Range   Sodium 138 135 - 145 mmol/L   Potassium 4.2 3.5 - 5.1 mmol/L   Chloride 106 98 - 111 mmol/L   CO2 24 22 - 32 mmol/L   Glucose, Bld 126 (H) 70 - 99 mg/dL   BUN 17 8 - 23 mg/dL   Creatinine, Ser 0.97 0.44 - 1.00 mg/dL   Calcium 8.9 8.9 - 10.3 mg/dL   Total Protein 6.9 6.5 - 8.1 g/dL   Albumin 4.0 3.5 - 5.0 g/dL   AST 34 15 - 41 U/L   ALT 19 0 - 44 U/L   Alkaline Phosphatase 88 38 - 126 U/L   Total Bilirubin 0.2 (L) 0.3 - 1.2  mg/dL   GFR calc non Af Amer 57 (L) >60 mL/min   GFR calc Af Amer >60 >60 mL/min   Anion gap 8 5 - 15    Comment: Performed at [facility], [address], [place], [place] [address]  Troponin I - Once     Status: None   Collection Time: 03/17/19  9:50 PM  Result Value Ref Range   Troponin I <0.03 <0.03 ng/mL    Comment: Performed at [facility], [address], [place], [place] [address]  CBC with Differential     Status: Abnormal   Collection Time: 03/17/19  9:50 PM  Result Value Ref Range   WBC 9.4 4.0 - 10.5 K/uL   RBC 4.09 3.87 - 5.11 MIL/uL   Hemoglobin 8.9 (L) 12.0 - 15.0 g/dL    Comment: Reticulocyte Hemoglobin testing may be clinically indicated, consider ordering this additional test [id number]    HCT 30.9 (L) 36.0 - 46.0 %   MCV 75.6 (L) 80.0 - 100.0 fL   MCH 21.8 (L) 26.0 - 34.0 pg   MCHC 28.8 (L) 30.0 - 36.0 g/dL   RDW 17.0 (H) 11.5 - 15.5 %   Platelets 362 150 - 400 K/uL   nRBC 0.0 0.0 -  0.2 %   Neutrophils Relative % 34 %   Neutro Abs 3.2 1.7 - 7.7 K/uL   Lymphocytes Relative 40 %   Lymphs Abs 3.8 0.7 - 4.0 K/uL   Monocytes Relative 15 %   Monocytes Absolute 1.4 (H) 0.1 - 1.0 K/uL   Eosinophils Relative 10 %   Eosinophils Absolute 1.0 (H) 0.0 - 0.5 K/uL   Basophils Relative 1 %   Basophils Absolute 0.1 0.0 - 0.1 K/uL   Immature Granulocytes 0 %   Abs Immature Granulocytes 0.02 0.00 - 0.07 K/uL    Comment: Performed at [facility], [address], [place], [place] [address]  MRSA PCR Screening     Status: None   Collection Time: 03/18/19 12:34 AM  Result Value Ref Range   MRSA by PCR NEGATIVE NEGATIVE    Comment:        The GeneXpert MRSA Assay (FDA approved for NASAL specimens only), is one component of a comprehensive MRSA colonization surveillance program. It is not intended to diagnose MRSA infection nor to guide or monitor treatment for MRSA infections. Performed at [facility], [address], [place], [place] [address]     ABGS No results  for input(s): PHART, PO2ART, TCO2, HCO3 in the last 72 hours.  Invalid input(s): PCO2 CULTURES Recent Results (from the past 240 hour(s))  MRSA PCR Screening     Status: None   Collection Time: 03/18/19 12:34 AM  Result Value Ref Range Status   MRSA by PCR NEGATIVE NEGATIVE Final    Comment:        The GeneXpert MRSA Assay (FDA approved for NASAL specimens only), is one component of a comprehensive MRSA colonization surveillance program. It is not intended to diagnose MRSA infection nor to guide or monitor treatment for MRSA infections. Performed at [facility], [address] [place], [place] [address]    Studies/Results: Dg Chest Portable 1 View  Result Date: 03/17/2019 CLINICAL DATA:  Shortness of breath and wheezing. EXAM: PORTABLE CHEST 1 VIEW COMPARISON:  Radiograph 11/06/2018. Chest CT 11/06/2018 FINDINGS: Chronic hyperinflation. Increased bronchial thickening from prior exam. Normal heart size and mediastinal contours. No focal airspace disease, pleural effusion, or pneumothorax. No acute osseous abnormalities. IMPRESSION: Increased bronchial thickening from prior exam suggesting acute bronchitis/COPD exacerbation. Electronically Signed   By: Keith Rake M.D.   On: 03/17/2019 22:14    Medications:  Prior to Admission:  Medications Prior to Admission  Medication Sig Dispense Refill Last Dose  . albuterol (PROVENTIL HFA;VENTOLIN HFA) 108 (90 BASE) MCG/ACT inhaler Inhale 2 puffs into the lungs every 6 (six) hours as needed for wheezing or shortness of breath. 1 Inhaler 1 09/30/2018 at Unknown time  . albuterol (PROVENTIL) (2.5 MG/3ML) 0.083% nebulizer solution Take 2.5 mg by nebulization every 4 (four) hours as needed for shortness of breath.   09/30/2018 at Unknown time  . Multiple Vitamins-Minerals (MULTIVITAMIN ADULT) CHEW Chew 2 each by mouth daily. Vitafusion gummy   09/30/2018 at Unknown time  . predniSONE (DELTASONE) 10 MG tablet Take 2 tablets (20 mg total) by mouth  daily. 14 tablet 0   . roflumilast (DALIRESP) 500 MCG TABS tablet Take 0.5 tablets (250 mcg total) by mouth daily. 30 tablet 12 09/30/2018 at Unknown time  . Tiotropium Bromide-Olodaterol (STIOLTO RESPIMAT) 2.5-2.5 MCG/ACT AERS Inhale 2 Inhalers into the lungs daily.    09/30/2018 at Unknown time   Scheduled: . chlorhexidine  15 mL Mouth Rinse BID  . [START ON 03/19/2019] enoxaparin (LOVENOX) injection  40 mg Subcutaneous Q24H  . ferrous sulfate  325 mg Oral Q breakfast  . guaiFENesin  1,200 mg Oral BID  . ipratropium-albuterol  3 mL Nebulization Q6H  . mouth rinse  15 mL Mouth Rinse q12n4p  . methylPREDNISolone (SOLU-MEDROL) injection  40 mg Intravenous Q12H   Continuous: . cefTRIAXone (ROCEPHIN)  IV     WGN:[id number] **OR** acetaminophen, albuterol  Assesment: She was admitted with COPD exacerbation and acute on chronic hypoxic respiratory failure.  She is better but not clear.  Chest x-ray which I personally reviewed looks like she probably has acute bronchitis  She has anemia at baseline and has had reflux and other GI issues and she is not taking iron so I am going to start that  She has hypertension and that is pretty well controlled Principal Problem:   Acute on chronic respiratory failure with hypoxia ([place]) Active Problems:   COPD exacerbation (HCC)   GERD (gastroesophageal reflux disease)   Essential hypertension    Plan: Add Rocephin.  Add IV steroids.  Continue other treatments.  Add iron    LOS: 0 days   Alonza Bogus 03/18/2019, 8:26 AM

## 2019-03-19 MED ORDER — PREDNISONE 20 MG PO TABS
40.0000 mg | ORAL_TABLET | Freq: Every day | ORAL | Status: DC
  Administered 2019-03-19: 40 mg via ORAL
  Filled 2019-03-19: qty 2

## 2019-03-19 MED ORDER — CEPHALEXIN 500 MG PO CAPS: 500.0000 mg | ORAL_CAPSULE | Freq: Four times a day (QID) | ORAL | 0 refills | Status: AC

## 2019-03-19 MED ORDER — FERROUS SULFATE 325 (65 FE) MG PO TABS: 325.0000 mg | ORAL_TABLET | Freq: Every day | ORAL | 3 refills | Status: DC

## 2019-03-19 MED ORDER — CEFDINIR 300 MG PO CAPS
300.0000 mg | ORAL_CAPSULE | Freq: Two times a day (BID) | ORAL | Status: DC
  Administered 2019-03-19: 300 mg via ORAL
  Filled 2019-03-19: qty 1

## 2019-03-19 MED ORDER — PREDNISONE 10 MG PO TABS: ORAL_TABLET | ORAL | 0 refills | Status: DC

## 2019-03-19 NOTE — Progress Notes (Signed)
Pt ambulated around the unit on room air oxygen sats stayed above 93%

## 2019-03-19 NOTE — Progress Notes (Signed)
Subjective: She says she feels much better and wants to go home.  She is been off oxygen all night.  She did not use BiPAP.  She is coughing mostly nonproductively.  She has no other new complaints.  Objective: Vital signs in last 24 hours: Temp:  [98 F (36.7 C)-98.7 F (37.1 C)] 98 F (36.7 C) (03/27 0400) Pulse Rate:  [63-114] 66 (03/27 0700) Resp:  [14-24] 19 (03/27 0700) BP: (80-122)/(41-98) 90/49 (03/27 0700) SpO2:  [91 %-100 %] 94 % (03/27 0700) Weight:  [47.7 kg] 47.7 kg (03/27 0400) Weight change: -1.288 kg Last BM Date: 03/18/19  Intake/Output from previous day: 03/26 0701 - 03/27 0700 In: 93.5 [IV Piggyback:93.5] Out: 1000 [Urine:1000]  PHYSICAL EXAM General appearance: alert, cooperative and no distress Resp: clear to auscultation bilaterally Cardio: regular rate and rhythm, S1, S2 normal, no murmur, click, rub or gallop GI: soft, non-tender; bowel sounds normal; no masses,  no organomegaly Extremities: extremities normal, atraumatic, no cyanosis or edema  Lab Results:  Results for orders placed or performed during the hospital encounter of 03/17/19 (from the past 48 hour(s))  Comprehensive metabolic panel     Status: Abnormal   Collection Time: 03/17/19  9:50 PM  Result Value Ref Range   Sodium 138 135 - 145 mmol/L   Potassium 4.2 3.5 - 5.1 mmol/L   Chloride 106 98 - 111 mmol/L   CO2 24 22 - 32 mmol/L   Glucose, Bld 126 (H) 70 - 99 mg/dL   BUN 17 8 - 23 mg/dL   Creatinine, Ser 0.97 0.44 - 1.00 mg/dL   Calcium 8.9 8.9 - 10.3 mg/dL   Total Protein 6.9 6.5 - 8.1 g/dL   Albumin 4.0 3.5 - 5.0 g/dL   AST 34 15 - 41 U/L   ALT 19 0 - 44 U/L   Alkaline Phosphatase 88 38 - 126 U/L   Total Bilirubin 0.2 (L) 0.3 - 1.2 mg/dL   GFR calc non Af Amer 57 (L) >60 mL/min   GFR calc Af Amer >60 >60 mL/min   Anion gap 8 5 - 15    Comment: Performed at [facility], [address] [place], [place] [address]  Troponin I - Once     Status: None   Collection Time: 03/17/19   9:50 PM  Result Value Ref Range   Troponin I <0.03 <0.03 ng/mL    Comment: Performed at [facility], [address], [place], [place] [address]  CBC with Differential     Status: Abnormal   Collection Time: 03/17/19  9:50 PM  Result Value Ref Range   WBC 9.4 4.0 - 10.5 K/uL   RBC 4.09 3.87 - 5.11 MIL/uL   Hemoglobin 8.9 (L) 12.0 - 15.0 g/dL    Comment: Reticulocyte Hemoglobin testing may be clinically indicated, consider ordering this additional test [id number]    HCT 30.9 (L) 36.0 - 46.0 %   MCV 75.6 (L) 80.0 - 100.0 fL   MCH 21.8 (L) 26.0 - 34.0 pg   MCHC 28.8 (L) 30.0 - 36.0 g/dL   RDW 17.0 (H) 11.5 - 15.5 %   Platelets 362 150 - 400 K/uL   nRBC 0.0 0.0 - 0.2 %   Neutrophils Relative % 34 %   Neutro Abs 3.2 1.7 - 7.7 K/uL   Lymphocytes Relative 40 %   Lymphs Abs 3.8 0.7 - 4.0 K/uL   Monocytes Relative 15 %   Monocytes Absolute 1.4 (H) 0.1 - 1.0 K/uL   Eosinophils  Relative 10 %   Eosinophils Absolute 1.0 (H) 0.0 - 0.5 K/uL   Basophils Relative 1 %   Basophils Absolute 0.1 0.0 - 0.1 K/uL   Immature Granulocytes 0 %   Abs Immature Granulocytes 0.02 0.00 - 0.07 K/uL    Comment: Performed at [facility], [address], [place], [place] [address]  MRSA PCR Screening     Status: None   Collection Time: 03/18/19 12:34 AM  Result Value Ref Range   MRSA by PCR NEGATIVE NEGATIVE    Comment:        The GeneXpert MRSA Assay (FDA approved for NASAL specimens only), is one component of a comprehensive MRSA colonization surveillance program. It is not intended to diagnose MRSA infection nor to guide or monitor treatment for MRSA infections. Performed at [facility], [address], [place], [place] [address]   Respiratory Panel by PCR     Status: None   Collection Time: 03/18/19 12:34 AM  Result Value Ref Range   Adenovirus NOT DETECTED NOT DETECTED   Coronavirus 229E NOT DETECTED NOT DETECTED    Comment: (NOTE) The Coronavirus on the Respiratory Panel, DOES NOT test for  the novel  Coronavirus (2019 nCoV)    Coronavirus HKU1 NOT DETECTED NOT DETECTED   Coronavirus NL63 NOT DETECTED NOT DETECTED   Coronavirus OC43 NOT DETECTED NOT DETECTED   Metapneumovirus NOT DETECTED NOT DETECTED   Rhinovirus / Enterovirus NOT DETECTED NOT DETECTED   Influenza A NOT DETECTED NOT DETECTED   Influenza B NOT DETECTED NOT DETECTED   Parainfluenza Virus 1 NOT DETECTED NOT DETECTED   Parainfluenza Virus 2 NOT DETECTED NOT DETECTED   Parainfluenza Virus 3 NOT DETECTED NOT DETECTED   Parainfluenza Virus 4 NOT DETECTED NOT DETECTED   Respiratory Syncytial Virus NOT DETECTED NOT DETECTED   Bordetella pertussis NOT DETECTED NOT DETECTED   Chlamydophila pneumoniae NOT DETECTED NOT DETECTED   Mycoplasma pneumoniae NOT DETECTED NOT DETECTED    Comment: Performed at [facility], [place] [address], [place], [place] [address]  Influenza panel by PCR (type A & B)     Status: None   Collection Time: 03/18/19 12:34 AM  Result Value Ref Range   Influenza A By PCR NEGATIVE NEGATIVE   Influenza B By PCR NEGATIVE NEGATIVE    Comment: (NOTE) The Xpert Xpress Flu assay is intended as an aid in the diagnosis of  influenza and should not be used as a sole basis for treatment.  This  assay is FDA approved for nasopharyngeal swab specimens only. Nasal  washings and aspirates are unacceptable for Xpert Xpress Flu testing. Performed at [facility], [address], [place], [place] [address]     ABGS No results for input(s): PHART, PO2ART, TCO2, HCO3 in the last 72 hours.  Invalid input(s): PCO2 CULTURES Recent Results (from the past 240 hour(s))  MRSA PCR Screening     Status: None   Collection Time: 03/18/19 12:34 AM  Result Value Ref Range Status   MRSA by PCR NEGATIVE NEGATIVE Final    Comment:        The GeneXpert MRSA Assay (FDA approved for NASAL specimens only), is one component of a comprehensive MRSA colonization surveillance program. It is not intended to diagnose  MRSA infection nor to guide or monitor treatment for MRSA infections. Performed at [facility], [address] [place], [place] [address]   Respiratory Panel by PCR     Status: None   Collection Time: 03/18/19 12:34  AM  Result Value Ref Range Status   Adenovirus NOT DETECTED NOT DETECTED Final   Coronavirus 229E NOT DETECTED NOT DETECTED Final    Comment: (NOTE) The Coronavirus on the Respiratory Panel, DOES NOT test for the novel  Coronavirus (2019 nCoV)    Coronavirus HKU1 NOT DETECTED NOT DETECTED Final   Coronavirus NL63 NOT DETECTED NOT DETECTED Final   Coronavirus OC43 NOT DETECTED NOT DETECTED Final   Metapneumovirus NOT DETECTED NOT DETECTED Final   Rhinovirus / Enterovirus NOT DETECTED NOT DETECTED Final   Influenza A NOT DETECTED NOT DETECTED Final   Influenza B NOT DETECTED NOT DETECTED Final   Parainfluenza Virus 1 NOT DETECTED NOT DETECTED Final   Parainfluenza Virus 2 NOT DETECTED NOT DETECTED Final   Parainfluenza Virus 3 NOT DETECTED NOT DETECTED Final   Parainfluenza Virus 4 NOT DETECTED NOT DETECTED Final   Respiratory Syncytial Virus NOT DETECTED NOT DETECTED Final   Bordetella pertussis NOT DETECTED NOT DETECTED Final   Chlamydophila pneumoniae NOT DETECTED NOT DETECTED Final   Mycoplasma pneumoniae NOT DETECTED NOT DETECTED Final    Comment: Performed at [facility], [place] [address] [place], [place] [address]   Studies/Results: Dg Chest Portable 1 View  Result Date: 03/17/2019 CLINICAL DATA:  Shortness of breath and wheezing. EXAM: PORTABLE CHEST 1 VIEW COMPARISON:  Radiograph 11/06/2018. Chest CT 11/06/2018 FINDINGS: Chronic hyperinflation. Increased bronchial thickening from prior exam. Normal heart size and mediastinal contours. No focal airspace disease, pleural effusion, or pneumothorax. No acute osseous abnormalities. IMPRESSION: Increased bronchial thickening from prior exam suggesting acute bronchitis/COPD exacerbation. Electronically Signed    By: Keith Rake M.D.   On: 03/17/2019 22:14    Medications:  Prior to Admission:  Medications Prior to Admission  Medication Sig Dispense Refill Last Dose  . albuterol (PROVENTIL HFA;VENTOLIN HFA) 108 (90 BASE) MCG/ACT inhaler Inhale 2 puffs into the lungs every 6 (six) hours as needed for wheezing or shortness of breath. 1 Inhaler 1 Past Week at Unknown time  . albuterol (PROVENTIL) (2.5 MG/3ML) 0.083% nebulizer solution Take 2.5 mg by nebulization every 4 (four) hours as needed for shortness of breath.   03/17/2019 at Unknown time  . doxycycline (ADOXA) 100 MG tablet Take 100 mg by mouth 2 (two) times daily.   unknown  . Multiple Vitamins-Minerals (MULTIVITAMIN ADULT) CHEW Chew 2 each by mouth daily. Vitafusion gummy   unknown at unknown  . pantoprazole (PROTONIX) 40 MG tablet Take 40 mg by mouth daily.   unknown  . predniSONE (DELTASONE) 10 MG tablet Take 2 tablets (20 mg total) by mouth daily. (Patient taking differently: Take 40 mg by mouth See admin instructions. Daily for 3 days) 14 tablet 0 unknown  . roflumilast (DALIRESP) 500 MCG TABS tablet Take 0.5 tablets (250 mcg total) by mouth daily. 30 tablet 12 unknown  . Tiotropium Bromide-Olodaterol (STIOLTO RESPIMAT) 2.5-2.5 MCG/ACT AERS Inhale 2 Inhalers into the lungs daily.    unknown   Scheduled: . cefdinir  300 mg Oral Q12H  . chlorhexidine  15 mL Mouth Rinse BID  . enoxaparin (LOVENOX) injection  40 mg Subcutaneous Q24H  . ferrous sulfate  325 mg Oral Q breakfast  . guaiFENesin  1,200 mg Oral BID  . ipratropium-albuterol  3 mL Nebulization Q6H  . mouth rinse  15 mL Mouth Rinse q12n4p  . predniSONE  40 mg Oral Q breakfast   Continuous:  UEA:[id number] **OR** acetaminophen, albuterol  Assesment: She was admitted with acute on chronic hypoxic respiratory failure  on the basis of COPD exacerbation probably acute bronchitis.  She is much improved and ready for discharge   Principal Problem:   Acute on chronic respiratory  failure with hypoxia ([place]) Active Problems:   COPD exacerbation (HCC)   GERD (gastroesophageal reflux disease)   Essential hypertension    Plan: Discharge home today    LOS: 1 day   Alonza Bogus 03/19/2019, 7:58 AM

## 2019-03-19 NOTE — Discharge Summary (Signed)
Physician Discharge Summary  Patient ID: Katelyn [name] MRN: [id number] DOB/AGE: Jul 15, 1941 78 y.o. Primary Care Physician:Keaten Mashek, Ramon Dredge, MD Admit date: 03/17/2019 Discharge date: 03/19/2019    Discharge Diagnoses:   Principal Problem:   Acute on chronic respiratory failure with hypoxia [facility]) Active Problems:   COPD exacerbation (HCC)   GERD (gastroesophageal reflux disease)   Essential hypertension   IDA (iron deficiency anemia)   Allergies as of 03/19/2019      Reactions   Sevoflurane    Pt's son stated "she cannot take".      Medication List    STOP taking these medications   doxycycline 100 MG tablet Commonly known as:  ADOXA     TAKE these medications   albuterol (2.5 MG/3ML) 0.083% nebulizer solution Commonly known as:  PROVENTIL Take 2.5 mg by nebulization every 4 (four) hours as needed for shortness of breath.   albuterol 108 (90 Base) MCG/ACT inhaler Commonly known as:  PROVENTIL HFA;VENTOLIN HFA Inhale 2 puffs into the lungs every 6 (six) hours as needed for wheezing or shortness of breath.   cephALEXin 500 MG capsule Commonly known as:  KEFLEX Take 1 capsule (500 mg total) by mouth 4 (four) times daily for 10 days.   ferrous sulfate 325 (65 FE) MG tablet Take 1 tablet (325 mg total) by mouth daily with breakfast.   Multivitamin Adult Chew Chew 2 each by mouth daily. Vitafusion gummy   pantoprazole 40 MG tablet Commonly known as:  PROTONIX Take 40 mg by mouth daily.   predniSONE 10 MG tablet Commonly known as:  DELTASONE Take 2 tablets (20 mg total) by mouth daily. What changed:    how much to take  when to take this  additional instructions   predniSONE 10 MG tablet Commonly known as:  DELTASONE 4 daily x 3days ,3 daily x3days,2 daily x 3 days, then 1 daily x 3 days What changed:  You were already taking a medication with the same name, and this prescription was added. Make sure you understand how and when to take each.   roflumilast  500 MCG Tabs tablet Commonly known as:  DALIRESP Take 0.5 tablets (250 mcg total) by mouth daily.   Stiolto Respimat 2.5-2.5 MCG/ACT Aers Generic drug:  Tiotropium Bromide-Olodaterol Inhale 2 Inhalers into the lungs daily.       Discharged Condition: Improved    Consults: None  Significant Diagnostic Studies: Dg Chest Portable 1 View  Result Date: 03/17/2019 CLINICAL DATA:  Shortness of breath and wheezing. EXAM: PORTABLE CHEST 1 VIEW COMPARISON:  Radiograph 11/06/2018. Chest CT 11/06/2018 FINDINGS: Chronic hyperinflation. Increased bronchial thickening from prior exam. Normal heart size and mediastinal contours. No focal airspace disease, pleural effusion, or pneumothorax. No acute osseous abnormalities. IMPRESSION: Increased bronchial thickening from prior exam suggesting acute bronchitis/COPD exacerbation. Electronically Signed   By: Narda Rutherford M.D.   On: 03/17/2019 22:14    Lab Results: Basic Metabolic Panel: Recent Labs    03/17/19 2150  NA 138  K 4.2  CL 106  CO2 24  GLUCOSE 126*  BUN 17  CREATININE 0.97  CALCIUM 8.9   Liver Function Tests: Recent Labs    03/17/19 2150  AST 34  ALT 19  ALKPHOS 88  BILITOT 0.2*  PROT 6.9  ALBUMIN 4.0     CBC: Recent Labs    03/17/19 2150  WBC 9.4  NEUTROABS 3.2  HGB 8.9*  HCT 30.9*  MCV 75.6*  PLT 362    Recent Results (from the  past 240 hour(s))  MRSA PCR Screening     Status: None   Collection Time: 03/18/19 12:34 AM  Result Value Ref Range Status   MRSA by PCR NEGATIVE NEGATIVE Final    Comment:        The GeneXpert MRSA Assay (FDA approved for NASAL specimens only), is one component of a comprehensive MRSA colonization surveillance program. It is not intended to diagnose MRSA infection nor to guide or monitor treatment for MRSA infections. Performed at [facility], [address], [place], [place] [address]   Respiratory Panel by PCR     Status: None   Collection Time: 03/18/19 12:34 AM   Result Value Ref Range Status   Adenovirus NOT DETECTED NOT DETECTED Final   Coronavirus 229E NOT DETECTED NOT DETECTED Final    Comment: (NOTE) The Coronavirus on the Respiratory Panel, DOES NOT test for the novel  Coronavirus (2019 nCoV)    Coronavirus HKU1 NOT DETECTED NOT DETECTED Final   Coronavirus NL63 NOT DETECTED NOT DETECTED Final   Coronavirus OC43 NOT DETECTED NOT DETECTED Final   Metapneumovirus NOT DETECTED NOT DETECTED Final   Rhinovirus / Enterovirus NOT DETECTED NOT DETECTED Final   Influenza A NOT DETECTED NOT DETECTED Final   Influenza B NOT DETECTED NOT DETECTED Final   Parainfluenza Virus 1 NOT DETECTED NOT DETECTED Final   Parainfluenza Virus 2 NOT DETECTED NOT DETECTED Final   Parainfluenza Virus 3 NOT DETECTED NOT DETECTED Final   Parainfluenza Virus 4 NOT DETECTED NOT DETECTED Final   Respiratory Syncytial Virus NOT DETECTED NOT DETECTED Final   Bordetella pertussis NOT DETECTED NOT DETECTED Final   Chlamydophila pneumoniae NOT DETECTED NOT DETECTED Final   Mycoplasma pneumoniae NOT DETECTED NOT DETECTED Final    Comment: Performed at [facility], [address] [place], [place] [address]     Hospital Course: This is a 78 year old with known severe COPD who came to the emergency department because she was having increasing shortness of breath.  She was hypoxic on admission.  She was started on BiPAP steroids fluids inhaled bronchodilators and improved.  She continued having wheezing the next morning and continued on steroids and antibiotics at that point.  She was coughing up some sputum.  Respiratory pathogen panel was negative.  She looked much better by the next morning had not required oxygen did not require BiPAP and was ready for discharge.  Discharge Exam: Blood pressure (!) 90/49, pulse 66, temperature 98 F (36.7 C), temperature source Oral, resp. rate 19, height 5\' 4"  (1.626 m), weight 47.7 kg, SpO2 94 %. She is awake and alert.  Her chest is  clear.  Heart is regular.  Disposition: Home.  We discussed home health services but she does not want that.      Signed: Fredirick Maudlin   03/19/2019, 8:13 AM

## 2019-03-19 NOTE — TOC Transition Note (Signed)
Transition of Care [facility]) - CM/SW Discharge Note   Patient Details  Name: Katelyn [name] MRN: [id number] Date of Birth: [birth year]  Transition of Care [facility]) CM/SW Contact:  Shade Flood, LCSW Phone Number: 03/19/2019, 9:31 AM   Clinical Narrative:     Pt is being discharged home today. MD offered pt [facility] RN but pt declined. LCSW did notify [facility] care manager of pt's admission and non compliance with O2 at home. [facility] care manager stated that they would follow up with pt.   Final next level of care: Home/Self Care Barriers to Discharge: No Barriers Identified   Patient Goals and CMS Choice        Discharge Placement                       Discharge Plan and Services                          Social Determinants of Health (SDOH) Interventions     Readmission Risk Interventions Readmission Risk Prevention Plan 03/19/2019  Transportation Screening Complete  PCP or Specialist Appt within 5-7 Days Complete  Home Care Screening Complete  Medication Review (RN CM) Complete  Some recent data might be hidden

## 2019-03-22 ENCOUNTER — Other Ambulatory Visit: Payer: Self-pay

## 2019-03-22 NOTE — Patient Outreach (Signed)
[place] [facility]) Care Management  03/22/2019  [name] 1941-06-11 [id number]   Notification received from Inpatient LCSW for outreach following hospital discharge. Successful outreach with Katelyn [name]. States she is doing well today but reports decreased activity tolerance. Experienced several episodes of shortness of breath with exertion over the weekend. States episodes quickly resolved with breathing treatments. Pending telephonic outreach with Dr. Luan Pulling. She expressed interest in ongoing [facility] outreach but prefers to complete telephonic assessment at another time. Agreeable to outreach on 03/29/19. Denies urgent concerns. Contact information provided. Encouraged to contact RNCM if needed prior to scheduled follow-up.  PLAN Will follow up on 03/29/19.   [place] [phone]

## 2019-03-23 DIAGNOSIS — I10 Essential (primary) hypertension: Secondary | ICD-10-CM | POA: Diagnosis not present

## 2019-03-23 DIAGNOSIS — J449 Chronic obstructive pulmonary disease, unspecified: Secondary | ICD-10-CM | POA: Diagnosis not present

## 2019-03-23 DIAGNOSIS — D649 Anemia, unspecified: Secondary | ICD-10-CM | POA: Diagnosis not present

## 2019-03-29 ENCOUNTER — Other Ambulatory Visit: Payer: Self-pay

## 2019-03-30 NOTE — Patient Outreach (Signed)
[facility]) [place] [birth year] [id number]    Successful outreach with Katelyn [name]. Telephonic assessment rescheduled for Wednesday.   PLAN Will follow up on 03/31/19.   [place] [phone]

## 2019-03-31 ENCOUNTER — Other Ambulatory Visit: Payer: Self-pay

## 2019-04-06 DIAGNOSIS — J441 Chronic obstructive pulmonary disease with (acute) exacerbation: Secondary | ICD-10-CM | POA: Diagnosis not present

## 2019-04-08 ENCOUNTER — Other Ambulatory Visit: Payer: Self-pay

## 2019-04-08 NOTE — Patient Outreach (Signed)
[place] [facility]) Care Management  04/08/2019  Katelyn [name] 1941-12-12 [id number]  Follow up outreach with Katelyn [name]. Reports episodes of shortness of breath which she attributes to seasonal allergies and sinus congestion. States the episodes resolved quickly. Denies worsening symptoms or changes in activity tolerance. Reports taking medications as prescribed and using nebulizer when needed. Discussed safety precautions and respiratory restrictions related to COVID-19. Katelyn [name] understands that she is high risk and reports following the recommended precautions. Denies immediate care management needs. Agreed to contact RNCM with questions or urgent concerns if needed.  PLAN Will continue routine outreach.   [place] [phone]

## 2019-04-26 ENCOUNTER — Other Ambulatory Visit: Payer: Self-pay

## 2019-04-26 NOTE — Patient Outreach (Signed)
[place] [facility]) Care Management   Katelyn [name] June 11, 1941 [id number]  Telephonic assessment complete. Katelyn [name] experienced a few episodes of exertional dyspnea since last outreach. Denies complaints of shortness of breath today. She reports using a pillbox but admits to skipping medication doses earlier today due to being busy. She intends to take them at the completion of the call. Denies concerns regarding medication management or affordability.  Discussed care management needs. Katelyn [name] lives with her son, [name] and grandson, [name]. Both are available to assist if needed. Reports performing tasks in the home without difficulty. Performs ADLs independently. Denies need for transportation assistance or referral for community resources. Agreeable to ongoing outreach for chronic disease management.  THN CM Care Plan Problem One     Most Recent Value  Care Plan Problem One  Risk for Readmission  Role Documenting the Problem One  Care Management [place] for Problem One  Active  THN Long Term Goal   Over the next 60 days patient will not be readmitted for complications related to COPD.  THN Long Term Goal Start Date  03/31/19  Interventions for Problem One Long Term Goal  Discussed medications, COPD plan for symptom management and activity tolerance.   THN CM Short Term Goal #1   Over the next 30 days, patient will take all medications as prescribed.  THN CM Short Term Goal #1 Start Date  03/31/19  Interventions for Short Term Goal #1  Reviewed medications and indications for use. Discussed current plan for COPD symptom management and patient's compliance with using prn treatments.  [Denies concerns regarding medication affordability.]  THN CM Short Term Goal #2   Over the next 30 days patient will complete all scheduled MD outreach.  THN CM Short Term Goal #2 Start Date  03/31/19  Interventions for Short Term Goal #2  Discussed pending MD outreach and  transportation needs.   THN CM Short Term Goal #3  Over the next 30 days patient will avoid activities that cause overexertion.  THN CM Short Term Goal #3 Start Date  03/31/19  Interventions for Short Tern Goal #3  Discussed activity tolerance and importance of avoiding activities that trigger COPD exacerbations.     PLAN Will continue routine outreach.   [place] [phone]

## 2019-04-26 NOTE — Patient Outreach (Signed)
[place] [facility]) Care Management  04/26/2019  Katelyn [name] Jan 26, 1941 [id number]   Successful outreach with Katelyn [name]. No significant changes since last outreach. She is very knowledgeable of her COPD exacerbation "triggers." Reports wearing a face mask as recommended and limiting her exposure to pollen and dust. Reports that her activity tolerance has increased. She uses supplemental oxygen as needed. Overall, she feels that she is doing very well. Scheduled for follow up with Dr. Luan Pulling on 05/11/19.  PLAN Will continue routine outreach.   [place] [phone]

## 2019-05-19 ENCOUNTER — Ambulatory Visit: Payer: Self-pay

## 2019-05-20 ENCOUNTER — Other Ambulatory Visit: Payer: Self-pay

## 2019-05-20 NOTE — Patient Outreach (Signed)
[facility]) Care Management  05/20/2019  Katelyn [name] Dec 02, 1941 [id number]   Successful outreach with [name]. She reports experiencing several episodes of shortness of breath on yesterday. States the symptoms were due to weather changes and sinus congestion. She did not require emergent medical treatment. Reports the symptoms resolved after using her nebulizer.  Today she reports feeling well. She is intentionally limiting her activity to avoid overexertion. She is also avoiding outdoor activities. Reports she is still able to perform small tasks in the home without assistance. She continues to perform ADLs independently.  [name] was scheduled to follow up with Dr. Luan Pulling earlier this month. The appointment was rescheduled for next week. She is agreeable to follow up outreach after she completes her scheduled appointment.  PLAN Will follow up next week.   [name] [phone]

## 2019-05-25 DIAGNOSIS — J9611 Chronic respiratory failure with hypoxia: Secondary | ICD-10-CM | POA: Diagnosis not present

## 2019-05-25 DIAGNOSIS — J449 Chronic obstructive pulmonary disease, unspecified: Secondary | ICD-10-CM | POA: Diagnosis not present

## 2019-05-25 DIAGNOSIS — F419 Anxiety disorder, unspecified: Secondary | ICD-10-CM | POA: Diagnosis not present

## 2019-05-27 ENCOUNTER — Other Ambulatory Visit: Payer: Self-pay

## 2019-05-27 NOTE — Patient Outreach (Signed)
[place] [facility]) Care Management  05/27/2019  Katelyn [name] 1941/06/17 [id number]   Attempted follow-up outreach. Left HIPAA compliant voice message requesting a return call.   PLAN Will follow up within 3-4 business days.   [place] [phone]

## 2019-06-01 ENCOUNTER — Other Ambulatory Visit: Payer: Self-pay

## 2019-06-01 NOTE — Patient Outreach (Signed)
Katelyn [name]  06/01/2019  Katelyn [name] May 12, 1941 [id number]  Successful outreach with Katelyn [name]. She reports speaking to Katelyn [name] last week but was unable to attend the scheduled office visit. She denies significant changes since our last conversation. She reports an occasional nonproductive cough. Uses supplemental oxygen as needed. Reports using her nebulizer immediately after waking but not requiring additional treatments during the day. She continues to ambulate well and is very pleased with her current level of activity. Reports "feeling good."  Katelyn [name]'s office visit with Katelyn [name] was rescheduled for 06/22/19. She expressed concerns regarding transportation for this appointment. Agreeable to Katelyn [name] assisting. She denies immediate care [name]. Agreed to contact RNCM if needed prior to next scheduled outreach.  PLAN -Will contact [place] [name] regarding transportation. -Will continue routine outreach with Katelyn [name].   [place] [phone]

## 2019-06-06 ENCOUNTER — Emergency Department (HOSPITAL_COMMUNITY)
Admission: EM | Admit: 2019-06-06 | Discharge: 2019-06-06 | Disposition: A | Discharge status: Home / Self Care | Payer: Medicare Other | Attending: Emergency Medicine | Admitting: Emergency Medicine

## 2019-06-06 ENCOUNTER — Emergency Department (HOSPITAL_COMMUNITY): Payer: Medicare Other

## 2019-06-06 ENCOUNTER — Other Ambulatory Visit: Payer: Self-pay

## 2019-06-06 DIAGNOSIS — F17210 Nicotine dependence, cigarettes, uncomplicated: Secondary | ICD-10-CM | POA: Insufficient documentation

## 2019-06-06 DIAGNOSIS — I110 Hypertensive heart disease with heart failure: Secondary | ICD-10-CM | POA: Diagnosis not present

## 2019-06-06 DIAGNOSIS — I509 Heart failure, unspecified: Secondary | ICD-10-CM | POA: Insufficient documentation

## 2019-06-06 DIAGNOSIS — J441 Chronic obstructive pulmonary disease with (acute) exacerbation: Secondary | ICD-10-CM | POA: Diagnosis not present

## 2019-06-06 DIAGNOSIS — R062 Wheezing: Secondary | ICD-10-CM | POA: Diagnosis not present

## 2019-06-06 DIAGNOSIS — Z79899 Other long term (current) drug therapy: Secondary | ICD-10-CM | POA: Diagnosis not present

## 2019-06-06 LAB — COMPREHENSIVE METABOLIC PANEL
ALT: 21 U/L (ref 0–44)
AST: 26 U/L (ref 15–41)
Albumin: 3.9 g/dL (ref 3.5–5.0)
Alkaline Phosphatase: 71 U/L (ref 38–126)
Anion gap: 10 (ref 5–15)
BUN: 18 mg/dL (ref 8–23)
CO2: 25 mmol/L (ref 22–32)
Calcium: 9.2 mg/dL (ref 8.9–10.3)
Chloride: 105 mmol/L (ref 98–111)
Creatinine, Ser: 0.79 mg/dL (ref 0.44–1.00)
GFR calc Af Amer: >60 mL/min (ref >60)
GFR calc non Af Amer: >60 mL/min (ref >60)
Glucose, Bld: 113 mg/dL — ABNORMAL HIGH (ref 70–99)
Potassium: 3.9 mmol/L (ref 3.5–5.1)
Sodium: 140 mmol/L (ref 135–145)
Total Bilirubin: 0.5 mg/dL (ref 0.3–1.2)
Total Protein: 6.4 g/dL — ABNORMAL LOW (ref 6.5–8.1)

## 2019-06-06 LAB — CBC WITH DIFFERENTIAL/PLATELET
Abs Immature Granulocytes: 0.02 10*3/uL (ref 0.00–0.07)
Basophils Absolute: 0 10*3/uL (ref 0.0–0.1)
Basophils Relative: 0 %
Eosinophils Absolute: 0.4 10*3/uL (ref 0.0–0.5)
Eosinophils Relative: 4 %
HCT: 38.1 % (ref 36.0–46.0)
Hemoglobin: 12 g/dL (ref 12.0–15.0)
Immature Granulocytes: 0 %
Lymphocytes Relative: 27 %
Lymphs Abs: 2.2 10*3/uL (ref 0.7–4.0)
MCH: 28.3 pg (ref 26.0–34.0)
MCHC: 31.5 g/dL (ref 30.0–36.0)
MCV: 89.9 fL (ref 80.0–100.0)
Monocytes Absolute: 0.9 10*3/uL (ref 0.1–1.0)
Monocytes Relative: 12 %
Neutro Abs: 4.6 10*3/uL (ref 1.7–7.7)
Neutrophils Relative %: 57 %
Platelets: 317 10*3/uL (ref 150–400)
RBC: 4.24 MIL/uL (ref 3.87–5.11)
RDW: 18.6 % — ABNORMAL HIGH (ref 11.5–15.5)
WBC: 8.1 10*3/uL (ref 4.0–10.5)
nRBC: 0 % (ref 0.0–0.2)

## 2019-06-06 MED ORDER — AZITHROMYCIN 250 MG PO TABS: 250.0000 mg | ORAL_TABLET | Freq: Every day | ORAL | 0 refills | Status: DC

## 2019-06-06 MED ORDER — PREDNISONE 20 MG PO TABS
40.0000 mg | ORAL_TABLET | Freq: Once | ORAL | Status: AC
  Administered 2019-06-06: 40 mg via ORAL
  Filled 2019-06-06: qty 2

## 2019-06-06 MED ORDER — PREDNISONE 20 MG PO TABS: 40.0000 mg | ORAL_TABLET | Freq: Every day | ORAL | 0 refills | Status: DC

## 2019-06-06 MED ORDER — ALBUTEROL SULFATE HFA 108 (90 BASE) MCG/ACT IN AERS
2.0000 | INHALATION_SPRAY | RESPIRATORY_TRACT | Status: AC
  Administered 2019-06-06: 2 via RESPIRATORY_TRACT
  Filled 2019-06-06: qty 6.7

## 2019-06-06 NOTE — ED Notes (Signed)
Ambulated patient O2 stayed 97-100, said she felt good walking around the Nurses station.

## 2019-06-06 NOTE — ED Provider Notes (Signed)
[facility] EMERGENCY DEPARTMENT Provider Note   CSN: [id number] Arrival date & time: 06/06/19  0700    History   Chief Complaint Chief Complaint  Patient presents with  . Wheezing    HPI Katelyn [name] is a 78 y.o. female.     HPI  The patient is a 78 year old female who presents to the hospital today with a complaint of increasing shortness of breath which she has noted started over the last couple of days.  Her symptoms are persistent, they seem to be gradually worsening and seem to be only minimally and temporarily improving with her albuterol treatments on her nebulizer at home.  She reports that she was admitted to the hospital in March, during which time she was admitted for COPD exacerbation.  She spent less than 48 hours in the hospital, was treated for her COPD exacerbation and discharged with a course of prednisone.  She states that she had done very well until the last couple of days when she started to having increasing shortness of breath especially with walking around the house.  The shortness of breath is present at rest but not as bad.  She has not noted fevers, swelling of the legs and has not had that much increased cough either.  She denies leaving her house since being discharged from the hospital and has not been exposed to anybody who has been sick.  She does have oxygen at home but does not use it and did not feel the need to use it prior to this visit.  Past Medical History:  Diagnosis Date  . Asthma   . Chronic neck pain   . Chronic pain of left elbow   . Complication of anesthesia    hard to wake  . COPD (chronic obstructive pulmonary disease) ([place])   . Dysphagia    requiring multiple dilations in past  . Headache    Patient states she needs  eyeglasses  . Irregular heart beat   . S/P colonoscopy 2009   dr. Gala Romney: anal papilla, 2 cecal AVMs. Due in 2014  . S/P endoscopy 2012   Dr. Gala Romney: mild chronic gastritis, bulbar erosions, s/p 54-French Maloney  dilation   . S/P endoscopy 2003, 2004, 2007   gastritis, duodenitis, s/p dilation, +H.pylori     Patient Active Problem List   Diagnosis Date Noted  . Acute on chronic respiratory failure with hypoxia ([place]) 03/17/2019  . IDA (iron deficiency anemia) 07/04/2018  . Acute metabolic encephalopathy 54/27/0623  . Acute respiratory failure with hypoxia and hypercapnia ([place]) 07/02/2018  . GERD (gastroesophageal reflux disease) 06/13/2018  . Malnutrition ([place]) 06/13/2018  . Essential hypertension 06/13/2018  . Tachycardia 04/14/2018  . Acute gastritis 03/31/2018  . Esophagitis 03/31/2018  . Duodenal ulcer 03/31/2018  . Goals of care, counseling/discussion   . DNR (do not resuscitate) discussion   . Palliative care by specialist   . COPD with acute exacerbation ([place]) 03/29/2018  . COPD (chronic obstructive pulmonary disease) ([place]) 02/03/2018  . Hypoxia 11/28/2015  . COPD exacerbation ([place]) 11/28/2015  . Acute respiratory failure with hypoxia ([place]) 11/28/2015  . SOB (shortness of breath) 11/28/2015  . Mucosal abnormality of stomach   . History of colonic polyps 04/25/2015  . 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 Dysphagia 03/11/2011    Past Surgical History:  Procedure Laterality Date  . APPENDECTOMY    . BIOPSY  03/30/2018   Procedure: BIOPSY;  Surgeon: Daneil Dolin, MD;  Location: [facility] ENDO SUITE;  Service: Gastroenterology;;  gastric  . CHOLECYSTECTOMY    . COLONOSCOPY  10/10/2008   RMR: 1. Anal papilla, otherwise normal rectum. 2. Two cecal arteriovenous malformations, colonic mucosa apperaed normal.   . COLONOSCOPY N/A 05/10/2015   RMR: Rectal and colonic polyps removed as described above. Ascending colon AVMs . Rectal polyps were tubular adenomas. Next colonoscopy May 2021.  . ESOPHAGEAL DILATION N/A 05/10/2015   Procedure: ESOPHAGEAL DILATION;  Surgeon: Daneil Dolin, MD;  Location: [facility] ENDO SUITE;  Service: Endoscopy;  Laterality: N/A;  . ESOPHAGOGASTRODUODENOSCOPY  04/02/2011    RMR: 1. Endoscopically normal -appearing esophagus status post passage of a Maloney dilator followed by biopsy. 2. Hiatal hernia, Mottling, and submucosal petechiae in teh gastric mucoas of uncertain significance status post biopsy. 3. Bulbar erosions as described above.   . ESOPHAGOGASTRODUODENOSCOPY N/A 05/10/2015   RMR: Normal esophagus status post Maloney dilation. Small hiatal hernia. Abnormal gastric because of doubtful clinical significance status post biopsy (benign)  . ESOPHAGOGASTRODUODENOSCOPY (EGD) WITH PROPOFOL N/A 03/30/2018   Procedure: ESOPHAGOGASTRODUODENOSCOPY (EGD) WITH PROPOFOL;  Surgeon: Daneil Dolin, MD;  Location: [facility];  Service: Gastroenterology;  Laterality: N/A;  WITH DILATION   . INGUINAL HERNIA REPAIR    . MALONEY DILATION  03/30/2018   Procedure: Venia Minks DILATION;  Surgeon: Daneil Dolin, MD;  Location: [facility];  Service: Gastroenterology;;  . TONSILLECTOMY    . TOTAL ABDOMINAL HYSTERECTOMY       OB History    Gravida  5   Para  5   Term  4   Preterm  1   AB      Living  4     SAB      TAB      Ectopic      Multiple      Live Births               Home Medications    Prior to Admission medications   Medication Sig Start Date End Date Taking? Authorizing Provider  albuterol (PROVENTIL HFA;VENTOLIN HFA) 108 (90 BASE) MCG/ACT inhaler Inhale 2 puffs into the lungs every 6 (six) hours as needed for wheezing or shortness of breath. 09/04/15  Yes [name], MD  albuterol (PROVENTIL) (2.5 MG/3ML) 0.083% nebulizer solution Take 2.5 mg by nebulization every 4 (four) hours as needed for shortness of breath.   Yes [provider]  ferrous sulfate 325 (65 FE) MG tablet Take 1 tablet (325 mg total) by mouth daily with breakfast. 03/19/19  Yes Sinda Du, MD  Multiple Vitamins-Minerals (MULTIVITAMIN ADULT) CHEW Chew 2 each by mouth daily. Vitafusion gummy   Yes [provider]  roflumilast (DALIRESP) 500 MCG TABS  tablet Take 0.5 tablets (250 mcg total) by mouth daily. 07/05/18  Yes Sinda Du, MD  azithromycin (ZITHROMAX Z-PAK) 250 MG tablet Take 1 tablet (250 mg total) by mouth daily. 500mg  PO day 1, then 250mg  PO days 205 06/06/19   Noemi Chapel, MD  predniSONE (DELTASONE) 20 MG tablet Take 2 tablets (40 mg total) by mouth daily. 06/06/19   Noemi Chapel, MD    Family History Family History  Problem Relation Age of Onset  . Heart disease Mother   . Lung cancer Father   . Cancer Sister   . Diabetes Brother   . Colon cancer Neg Hx     Social History Social History   Tobacco Use  . Smoking status: Current Every Day Smoker    Packs/day: 1.00    Years: 57.00  Pack years: 57.00    Types: Cigarettes    Start date: 08/18/1959  . Smokeless tobacco: Never Used  Substance Use Topics  . Alcohol use: No    Alcohol/week: 0.0 standard drinks  . Drug use: No     Allergies   Sevoflurane   Review of Systems Review of Systems  Constitutional: Negative for chills and fever.  HENT: Negative for sore throat.   Eyes: Negative for visual disturbance.  Respiratory: Positive for cough ( Minimal), shortness of breath and wheezing.   Cardiovascular: Negative for chest pain.  Gastrointestinal: Negative for abdominal pain, diarrhea, nausea and vomiting.  Genitourinary: Negative for dysuria and frequency.  Musculoskeletal: Negative for back pain and neck pain.  Skin: Negative for rash.  Neurological: Negative for weakness, numbness and headaches.  Hematological: Negative for adenopathy.  Psychiatric/Behavioral: Negative for behavioral problems.  All other systems reviewed and are negative.    Physical Exam Updated Vital Signs BP 107/63   Pulse 67   Temp 98.1 F (36.7 C) (Oral)   Resp 18   Ht 1.651 m (5\' 5" )   Wt 54.4 kg   SpO2 99%   BMI 19.97 kg/m   Physical Exam Vitals signs and nursing note reviewed.  Constitutional:      General: She is not in acute distress.    Appearance:  She is well-developed.  HENT:     Head: Normocephalic and atraumatic.     Mouth/Throat:     Pharynx: No oropharyngeal exudate.  Eyes:     General: No scleral icterus.       Right eye: No discharge.        Left eye: No discharge.     Conjunctiva/sclera: Conjunctivae normal.     Pupils: Pupils are equal, round, and reactive to light.  Neck:     Musculoskeletal: Normal range of motion and neck supple.     Thyroid: No thyromegaly.     Vascular: No JVD.  Cardiovascular:     Rate and Rhythm: Normal rate and regular rhythm.     Heart sounds: Normal heart sounds. No murmur. No friction rub. No gallop.   Pulmonary:     Breath sounds: Wheezing present. No rales.     Comments: The patient has diminished lung sounds bilaterally, she has a prolonged expiratory phase with slight tachypnea, she is able to speak in full sentences but has to take deep breaths between them.  No accessory muscle use Abdominal:     General: Bowel sounds are normal. There is no distension.     Palpations: Abdomen is soft. There is no mass.     Tenderness: There is no abdominal tenderness.  Musculoskeletal: Normal range of motion.        General: No tenderness.  Lymphadenopathy:     Cervical: No cervical adenopathy.  Skin:    General: Skin is warm and dry.     Findings: No erythema or rash.  Neurological:     Mental Status: She is alert.     Coordination: Coordination normal.  Psychiatric:        Behavior: Behavior normal.      ED Treatments / Results  Labs (all labs ordered are listed, but only abnormal results are displayed) Labs Reviewed  CBC WITH DIFFERENTIAL/PLATELET - Abnormal; Notable for the following components:      Result Value   RDW 18.6 (*)    All other components within normal limits  COMPREHENSIVE METABOLIC PANEL - Abnormal; Notable for the following components:  Glucose, Bld 113 (*)    Total Protein 6.4 (*)    All other components within normal limits    EKG EKG Interpretation   Date/Time:  "Sunday June 06 2019 07:13:53 EDT Ventricular Rate:  73 PR Interval:    QRS Duration: 84 QT Interval:  374 QTC Calculation: 413 R Axis:   14 Text Interpretation:  Sinus rhythm RSR' in V1 or V2, right VCD or RVH Baseline wander in lead(s) V2 V3 since last tracing no significant change other than slower rate Confirmed by ,  ([id number]) on 06/06/2019 7:19:13 AM   Radiology Dg Chest Port 1 View  Result Date: 06/06/2019 CLINICAL DATA:  Wheezing for 1 day. EXAM: PORTABLE CHEST 1 VIEW COMPARISON:  March 17, 2019 FINDINGS: Cardiomegaly. The hila and mediastinum are normal. No pulmonary nodules, masses, or focal infiltrates. No overt edema. IMPRESSION: No active disease. Electronically Signed   By: David  Williams III M.D   On: 06/06/2019 08:02    Procedures Procedures (including critical care time)  Medications Ordered in ED Medications  albuterol (VENTOLIN HFA) 108 (90 Base) MCG/ACT inhaler 2 puff (2 puffs Inhalation Given 06/06/19 0730)  predniSONE (DELTASONE) tablet 40 mg (40 mg Oral Given 06/06/19 0732)     Initial Impression / Assessment and Plan / ED Course  I have reviewed the triage vital signs and the nursing notes.  Pertinent labs & imaging results that were available during my care of the patient were reviewed by me and considered in my medical decision making (see chart for details).  Clinical Course as of Jun 05 1056  Sun Jun 06, 2019  0815 I have personally viewed and interpreted the portable chest x-ray done in the patient's room.  There does not appear to be any significant infiltrates, she does have some hyperexpansion of the lungs but no pneumothorax and a normal-appearing heart and mediastinum.  No abnormal skin or soft tissue.  I agree with the radiologist   [BM]  0925 The patient was reexamined, still has normal vital signs, feels better after inhaled treatments and prednisone, ambulating at this time   [BM]  1055 Ambulated on room air at 97% without  tachycardia or dyspnea, the patient appears stable for discharge at this time.   [BM]    Clinical Course User Index [BM] , , MD       At this time the patient does appear to have some increased work of breathing, she is not in severe distress, her vital signs are normal including her oxygen content at room air which is 97 to 98%.  We will check a chest x-ray and give a breathing treatment.  I do not think this is consistent with sepsis, this is likely a COPD exacerbation.  She has not been out of her house in over 2 months, she has not had any exposures with coronavirus or other significant illnesses.  She does not take daily breathing medications unless she feels short of breath when she uses her rescue inhalers and nebulizers.  Her medication list says differently however she reports only using the inhalers when she is feeling short of breath.  Final Clinical Impressions(s) / ED Diagnoses   Final diagnoses:  COPD exacerbation (HCC)    ED Discharge Orders         Ordered    predniSONE (DELTASONE) 20 MG tablet  Daily     06/06/19 1056    azithromycin (ZITHROMAX Z-PAK) 250 MG tablet  Daily     06" /14/20 1056  Noemi Chapel, MD 06/06/19 1057

## 2019-06-06 NOTE — Discharge Instructions (Signed)
Use the albuterol with a spacer or on your nebulizer every 4 hours for the next 24 hours, then every 4 hours as needed.  Prednisone daily for 5 days  Zithromax daily for 5 days  Seek a medical exam for severe or worsening symptoms in the emergency department otherwise see your doctor within 3 days for recheck  Thank you for letting us take care of you today!  Please obtain all of your results from medical records or have your doctors office obtain the results - share them with your doctor - you should be seen at your doctors office in the next 2 days. Call today to arrange your follow up. Take the medications as prescribed. Please review all of the medicines and only take them if you do not have an allergy to them. Please be aware that if you are taking birth control pills, taking other prescriptions, ESPECIALLY ANTIBIOTICS may make the birth control ineffective - if this is the case, either do not engage in sexual activity or use alternative methods of birth control such as condoms until you have finished the medicine and your family doctor says it is OK to restart them. If you are on a blood thinner such as COUMADIN, be aware that any other medicine that you take may cause the coumadin to either work too much, or not enough - you should have your coumadin level rechecked in next 7 days if this is the case.  ?  It is also a possibility that you have an allergic reaction to any of the medicines that you have been prescribed - Everybody reacts differently to medications and while MOST people have no trouble with most medicines, you may have a reaction such as nausea, vomiting, rash, swelling, shortness of breath. If this is the case, please stop taking the medicine immediately and contact your physician.   If you were given a medication in the ED such as percocet, vicodin, or morphine, be aware that these medicines are sedating and may change your ability to take care of yourself adequately for several  hours after being given this medicines - you should not drive or take care of small children if you were given this medicine in the Emergency Department or if you have been prescribed these types of medicines. ?   You should return to the ER IMMEDIATELY if you develop severe or worsening symptoms.

## 2019-06-06 NOTE — ED Triage Notes (Signed)
Patient complains of wheezing for one day.

## 2019-06-09 ENCOUNTER — Other Ambulatory Visit: Payer: Self-pay

## 2019-06-09 NOTE — Patient Outreach (Signed)
[facility]) Care Management  06/09/2019  HENLEY BLYTH 03-24-1941 [id number]   Request received from [facility], Neldon Labella, to contact patient about transportation need for appointment with Dr. Luan Pulling on 06/22/19 @ 11:30.   BSW and patient discussed [facility] but patient [place].  She said that her son or a friend may be able to assist. BSW will follow up with her next week and schedule transportation via [facility] contract with [place] if son or friend cannot take her.  Ronn Melena, BSW Social Worker [phone]

## 2019-06-18 ENCOUNTER — Other Ambulatory Visit: Payer: Self-pay

## 2019-06-18 NOTE — Patient Outreach (Signed)
[place] [facility]) Care Management  06/18/2019  SHERICKA JOHNSTONE 1941-09-27 [id number]   Transportation arranged via [place] for appointment with Dr. Luan Pulling on 06/22/19 @ 11:30 AM.    Ronn Melena, [place] Worker [phone]

## 2019-06-18 NOTE — Patient Outreach (Signed)
[place]) Care Management  06/18/2019  NOREENE BOREMAN 09-09-1941 [id number]   Follow-up regarding pending PCP appointment. Ms. Gittens confirmed follow-up outreach with [place] and [employer] transportation. Agreeable to outreach within a month or sooner if her care management needs change.  PLAN -Will continue routine outreach.  [place] [phone]

## 2019-06-22 DIAGNOSIS — J449 Chronic obstructive pulmonary disease, unspecified: Secondary | ICD-10-CM | POA: Diagnosis not present

## 2019-06-22 DIAGNOSIS — J9611 Chronic respiratory failure with hypoxia: Secondary | ICD-10-CM | POA: Diagnosis not present

## 2019-06-22 DIAGNOSIS — F419 Anxiety disorder, unspecified: Secondary | ICD-10-CM | POA: Diagnosis not present

## 2019-06-24 ENCOUNTER — Ambulatory Visit: Payer: Medicare Other

## 2019-07-03 ENCOUNTER — Encounter (HOSPITAL_COMMUNITY): Payer: Self-pay | Admitting: Emergency Medicine

## 2019-07-03 ENCOUNTER — Other Ambulatory Visit: Payer: Self-pay

## 2019-07-03 ENCOUNTER — Emergency Department (HOSPITAL_COMMUNITY)
Admission: EM | Admit: 2019-07-03 | Discharge: 2019-07-03 | Disposition: A | Discharge status: Home / Self Care | Payer: Medicare Other | Attending: Emergency Medicine | Admitting: Emergency Medicine

## 2019-07-03 ENCOUNTER — Emergency Department (HOSPITAL_COMMUNITY): Payer: Medicare Other

## 2019-07-03 DIAGNOSIS — F17210 Nicotine dependence, cigarettes, uncomplicated: Secondary | ICD-10-CM | POA: Diagnosis not present

## 2019-07-03 DIAGNOSIS — R05 Cough: Secondary | ICD-10-CM | POA: Diagnosis not present

## 2019-07-03 DIAGNOSIS — J449 Chronic obstructive pulmonary disease, unspecified: Secondary | ICD-10-CM | POA: Insufficient documentation

## 2019-07-03 DIAGNOSIS — Z79899 Other long term (current) drug therapy: Secondary | ICD-10-CM | POA: Diagnosis not present

## 2019-07-03 DIAGNOSIS — R0602 Shortness of breath: Secondary | ICD-10-CM | POA: Insufficient documentation

## 2019-07-03 DIAGNOSIS — Z20828 Contact with and (suspected) exposure to other viral communicable diseases: Secondary | ICD-10-CM | POA: Insufficient documentation

## 2019-07-03 DIAGNOSIS — I10 Essential (primary) hypertension: Secondary | ICD-10-CM | POA: Diagnosis not present

## 2019-07-03 LAB — TROPONIN I (HIGH SENSITIVITY)
Troponin I (High Sensitivity): 4 ng/L (ref <18)
Troponin I (High Sensitivity): 4 ng/L (ref <18)

## 2019-07-03 LAB — CBC
HCT: 36.7 % (ref 36.0–46.0)
Hemoglobin: 11.6 g/dL — ABNORMAL LOW (ref 12.0–15.0)
MCH: 28.6 pg (ref 26.0–34.0)
MCHC: 31.6 g/dL (ref 30.0–36.0)
MCV: 90.4 fL (ref 80.0–100.0)
Platelets: 342 10*3/uL (ref 150–400)
RBC: 4.06 MIL/uL (ref 3.87–5.11)
RDW: 14.3 % (ref 11.5–15.5)
WBC: 6.4 10*3/uL (ref 4.0–10.5)
nRBC: 0 % (ref 0.0–0.2)

## 2019-07-03 LAB — BASIC METABOLIC PANEL
Anion gap: 8 (ref 5–15)
BUN: 12 mg/dL (ref 8–23)
CO2: 26 mmol/L (ref 22–32)
Calcium: 9.3 mg/dL (ref 8.9–10.3)
Chloride: 107 mmol/L (ref 98–111)
Creatinine, Ser: 0.86 mg/dL (ref 0.44–1.00)
GFR calc Af Amer: >60 mL/min (ref >60)
GFR calc non Af Amer: >60 mL/min (ref >60)
Glucose, Bld: 94 mg/dL (ref 70–99)
Potassium: 3.9 mmol/L (ref 3.5–5.1)
Sodium: 141 mmol/L (ref 135–145)

## 2019-07-03 LAB — SARS CORONAVIRUS 2 BY RT PCR (HOSPITAL ORDER, PERFORMED IN ~~LOC~~ HOSPITAL LAB): SARS Coronavirus 2: NEGATIVE

## 2019-07-03 MED ORDER — METHYLPREDNISOLONE SODIUM SUCC 125 MG IJ SOLR
125.0000 mg | Freq: Once | INTRAMUSCULAR | Status: AC
  Administered 2019-07-03: 125 mg via INTRAVENOUS
  Filled 2019-07-03: qty 2

## 2019-07-03 MED ORDER — ALBUTEROL SULFATE HFA 108 (90 BASE) MCG/ACT IN AERS
8.0000 | INHALATION_SPRAY | RESPIRATORY_TRACT | Status: DC | PRN
  Administered 2019-07-03: 8 via RESPIRATORY_TRACT
  Filled 2019-07-03: qty 6.7

## 2019-07-03 NOTE — ED Notes (Signed)
Ambulated pt to restroom. Pt's pulse ox stayed between 95-100.

## 2019-07-03 NOTE — ED Notes (Signed)
Pt's son [name] called and spoke with him, awaiting for labs and cxr results.  Number to pt's room provided to [facility]

## 2019-07-03 NOTE — ED Triage Notes (Signed)
Short of breath x 3 days   Hx of COPD- former smoker   Home O2 PRN

## 2019-07-03 NOTE — ED Provider Notes (Signed)
[facility] EMERGENCY DEPARTMENT Provider Note   CSN: [id number] Arrival date & time: 07/03/19  1159    History   Chief Complaint Chief Complaint  Patient presents with   Shortness of Breath    HPI Katelyn [name] is a 78 y.o. female.     The history is provided by the patient and medical records. No language interpreter was used.  Shortness of Breath Associated symptoms: cough   Associated symptoms: no chest pain    Katelyn [name] is a 78 y.o. female  with a PMH as listed below including COPD who presents to the Emergency Department complaining of shortness of breath.  Patient states that she always feels a little short of breath, but this morning, felt much more severe than usual.  She denies any associated chest pain, nausea or vomiting.  No sudden sweating.  No leg swelling.  No fever.  She does report cough which she feels is baseline for her.  She tried her inhaler with little improvement.  She does have home oxygen which she only uses as needed.  She put it on this morning which did seem to help a little.     Past Medical History:  Diagnosis Date   Asthma    Chronic neck pain    Chronic pain of left elbow    Complication of anesthesia    hard to wake   COPD (chronic obstructive pulmonary disease) ([place])    Dysphagia    requiring multiple dilations in past   Headache    Patient states she needs  eyeglasses   Irregular heart beat    S/P colonoscopy 2009   dr. Gala Romney: anal papilla, 2 cecal AVMs. Due in 2014   S/P endoscopy 2012   Dr. Gala Romney: mild chronic gastritis, bulbar erosions, s/p 54-French Maloney dilation    S/P endoscopy 2003, 2004, 2007   gastritis, duodenitis, s/p dilation, +H.pylori     Patient Active Problem List   Diagnosis Date Noted   Acute on chronic respiratory failure with hypoxia ([place]) 03/17/2019   IDA (iron deficiency anemia) 93/26/7124   Acute metabolic encephalopathy 58/08/9832   Acute respiratory failure with hypoxia and  hypercapnia ([place]) 07/02/2018   GERD (gastroesophageal reflux disease) 06/13/2018   Malnutrition ([place]) 06/13/2018   Essential hypertension 06/13/2018   Tachycardia 04/14/2018   Acute gastritis 03/31/2018   Esophagitis 03/31/2018   Duodenal ulcer 03/31/2018   Goals of care, counseling/discussion    DNR (do not resuscitate) discussion    Palliative care by specialist    COPD with acute exacerbation ([place]) 03/29/2018   COPD (chronic obstructive pulmonary disease) ([place]) 02/03/2018   Hypoxia 11/28/2015   COPD exacerbation ([place]) 11/28/2015   Acute respiratory failure with hypoxia ([place]) 11/28/2015   SOB (shortness of breath) 11/28/2015   Mucosal abnormality of stomach    History of colonic polyps 04/25/2015   Gastritis, chronic 04/28/2011   Dysphagia 03/11/2011    Past Surgical History:  Procedure Laterality Date   APPENDECTOMY     BIOPSY  03/30/2018   Procedure: BIOPSY;  Surgeon: Daneil Dolin, MD;  Location: [facility] ENDO SUITE;  Service: Gastroenterology;;  gastric   CHOLECYSTECTOMY     COLONOSCOPY  10/10/2008   RMR: 1. Anal papilla, otherwise normal rectum. 2. Two cecal arteriovenous malformations, colonic mucosa apperaed normal.    COLONOSCOPY N/A 05/10/2015   RMR: Rectal and colonic polyps removed as described above. Ascending colon AVMs . Rectal polyps were tubular adenomas. Next colonoscopy May 2021.   ESOPHAGEAL  DILATION N/A 05/10/2015   Procedure: ESOPHAGEAL DILATION;  Surgeon: Daneil Dolin, MD;  Location: [facility] END[facility] SU[facility];  Service: Endoscopy;  Laterality: N/A;   ESOPHAGOGASTRODUODENOSCOPY  04/02/2011   RMR: 1. Endoscopically normal -appearing esophagus status post passage of a Maloney dilator followed by biopsy. 2. Hiatal hernia, Mottling, and submucosal petechiae in teh gastric mucoas of uncertain significance status post biopsy. 3. Bulbar erosions as described above.    ESOPHAGOGASTRODUODENOSCOPY N/A 05/10/2015   RMR: Normal esophagus status post Maloney  dilation. Small hiatal hernia. Abnormal gastric because of doubtful clinical significance status post biopsy (benign)   ESOPHAGOGASTRODUODENOSCOPY (EGD) WITH PROPOFOL N/A 03/30/2018   Procedure: ESOPHAGOGASTRODUODENOSCOPY (EGD) WITH PROPOFOL;  Surgeon: Daneil Dolin, MD;  Location: [facility];  Service: Gastroenterology;  Laterality: N/A;  WITH DILATION    INGUINAL HERNIA REPAIR     MALONEY DILATION  03/30/2018   Procedure: MALONEY DILATION;  Surgeon: Daneil Dolin, MD;  Location: [facility];  Service: Gastroenterology;;   TONSILLECTOMY     TOTAL ABDOMINAL HYSTERECTOMY       OB History    Gravida  5   Para  5   Term  4   Preterm  1   AB      Living  4     SAB      TAB      Ectopic      Multiple      Live Births               Home Medications    Prior to Admission medications   Medication Sig Start Date End Date Taking? Authorizing Provider  albuterol (PROVENTIL HFA;VENTOLIN HFA) 108 (90 BASE) MCG/ACT inhaler Inhale 2 puffs into the lungs every 6 (six) hours as needed for wheezing or shortness of breath. 09/04/15  Yes [name], MD  albuterol (PROVENTIL) (2.5 MG/3ML) 0.083% nebulizer solution Take 2.5 mg by nebulization every 4 (four) hours as needed for shortness of breath.   Yes [provider]  ferrous sulfate 325 (65 FE) MG tablet Take 1 tablet (325 mg total) by mouth daily with breakfast. 03/19/19  Yes Sinda Du, MD  Multiple Vitamins-Minerals (MULTIVITAMIN ADULT) CHEW Chew 2 each by mouth daily. Vitafusion gummy   Yes [provider]  roflumilast (DALIRESP) 500 MCG TABS tablet Take 0.5 tablets (250 mcg total) by mouth daily. 07/05/18  Yes Sinda Du, MD  azithromycin (ZITHROMAX Z-PAK) 250 MG tablet Take 1 tablet (250 mg total) by mouth daily. 500mg  PO day 1, then 250mg  PO days 205 Patient not taking: Reported on 07/03/2019 06/06/19   Noemi Chapel, MD  predniSONE (DELTASONE) 20 MG tablet Take 2 tablets (40 mg total) by  mouth daily. Patient not taking: Reported on 07/03/2019 06/06/19   Noemi Chapel, MD    Family History Family History  Problem Relation Age of Onset   Heart disease Mother    Lung cancer Father    Cancer Sister    Diabetes Brother    Colon cancer Neg Hx     Social History Social History   Tobacco Use   Smoking status: Current Every Day Smoker    Packs/day: 1.00    Years: 57.00    Pack years: 57.00    Types: Cigarettes    Start date: 08/18/1959   Smokeless tobacco: Never Used  Substance Use Topics   Alcohol use: No    Alcohol/week: 0.0 standard drinks   Drug use: No     Allergies  Sevoflurane   Review of Systems Review of Systems  Respiratory: Positive for cough and shortness of breath.   Cardiovascular: Negative for chest pain, palpitations and leg swelling.  All other systems reviewed and are negative.    Physical Exam Updated Vital Signs BP 123/75    Pulse 63    Temp 98.3 F (36.8 C) (Oral)    Resp (!) 25    Ht 5\' 4"  (1.626 m)    Wt 49.9 kg    SpO2 98%    BMI 18.88 kg/m   Physical Exam Vitals signs and nursing note reviewed.  Constitutional:      General: She is not in acute distress.    Appearance: She is well-developed.  HENT:     Head: Normocephalic and atraumatic.     Mouth/Throat:     Mouth: Mucous membranes are moist.     Pharynx: Oropharynx is clear.  Neck:     Musculoskeletal: Neck supple.  Cardiovascular:     Rate and Rhythm: Normal rate and regular rhythm.     Heart sounds: Normal heart sounds. No murmur.  Pulmonary:     Effort: No respiratory distress.     Breath sounds: Normal breath sounds.     Comments: Tachypnea. Lungs clear to ausculation bilaterally. Abdominal:     General: There is no distension.     Palpations: Abdomen is soft.     Tenderness: There is no abdominal tenderness.  Musculoskeletal:     Comments: No lower extremity swelling or calf tenderness.  Skin:    General: Skin is warm and dry.  Neurological:      Mental Status: She is alert and oriented to person, place, and time.      ED Treatments / Results  Labs (all labs ordered are listed, but only abnormal results are displayed) Labs Reviewed  CBC - Abnormal; Notable for the following components:      Result Value   Hemoglobin 11.6 (*)    All other components within normal limits  SARS CORONAVIRUS 2 (HOSPITAL ORDER, [facility])  BASIC METABOLIC PANEL  TROPONIN I (HIGH SENSITIVITY)  TROPONIN I (HIGH SENSITIVITY)    EKG EKG Interpretation  Date/Time:  Saturday July 03 2019 12:18:20 EDT Ventricular Rate:  70 PR Interval:    QRS Duration: 99 QT Interval:  417 QTC Calculation: 450 R Axis:   -52 Text Interpretation:  Sinus rhythm Consider left atrial enlargement LAD, consider left anterior fascicular block No significant change since last tracing Confirmed by Fredia Sorrow [phone]) on 07/03/2019 1:53:04 PM   Radiology Dg Chest Portable 1 View  Result Date: 07/03/2019 CLINICAL DATA:  Shortness of breath for 3 days. EXAM: PORTABLE CHEST 1 VIEW COMPARISON:  Chest radiograph 06/06/2019 FINDINGS: Cardiomediastinal silhouette is normal. Mediastinal contours appear intact. Calcific atherosclerotic disease of the aorta. There is no evidence of focal airspace consolidation, pleural effusion or pneumothorax. Mild upper lobe predominant emphysema. Osseous structures are without acute abnormality. Soft tissues are grossly normal. IMPRESSION: 1. Mild upper lobe predominant emphysema. 2. Calcific atherosclerotic disease of the aorta. Electronically Signed   By: Fidela Salisbury M.D.   On: 07/03/2019 12:55    Procedures Procedures (including critical care time)  Medications Ordered in ED Medications  albuterol (VENTOLIN HFA) 108 (90 Base) MCG/ACT inhaler 8 puff (8 puffs Inhalation Given 07/03/19 1325)  methylPREDNISolone sodium succinate (SOLU-MEDROL) 125 mg/2 mL injection 125 mg (125 mg Intravenous Given 07/03/19  1309)     Initial Impression / Assessment  and Plan / ED Course  I have reviewed the triage vital signs and the nursing notes.  Pertinent labs & imaging results that were available during my care of the patient were reviewed by me and considered in my medical decision making (see chart for details).       Katelyn [name] is a 78 y.o. female who presents to ED for shortness of breath.  On initial exam, patient afebrile, hemodynamically stable with clear lung exam.  She was quite tachypneic she had increased effort in her breathing while discussing her symptoms.  Fortunately, once we began talking about she appeared much more calm and respiratory rate normalized.  She was given Solu-Medrol and several puffs of albuterol inhaler.  On reevaluation, she feels as if her breathing is now improved.  Labs reviewed and reassuring including normal troponin.  Chest x-ray without filtrate or other acute abnormalities.  EKG nonischemic with no change from previous.  On her reevaluation, maintaining her oxygenation well.  She is tolerating p.o.  Breathing is comfortable.  She is now complaining of feeling mild sore throat and is the back of her throat feels sticking.  Oropharynx is clear.  Airway is patent.  Not overtly erythematous.  Doubt infectious etiology.  Evaluation does not show pathology that would require ongoing emergent intervention or inpatient treatment.  We will have her follow-up with ENT.  Reasons to return to the emergency department were discussed and all questions answered   Patient seen by and discussed with Dr. Rogene Houston who agrees with treatment plan.    Final Clinical Impressions(s) / ED Diagnoses   Final diagnoses:  SOB (shortness of breath)    ED Discharge Orders    None       Caymen Dubray, Ozella Almond, PA-C 07/03/19 1433    Fredia Sorrow, MD 07/04/19 [phone]

## 2019-07-03 NOTE — ED Provider Notes (Signed)
Medical screening examination/treatment/procedure(s) were conducted as a shared visit with non-physician practitioner(s) and myself.  I personally evaluated the patient during the encounter.  EKG Interpretation  Date/Time:  Saturday July 03 2019 12:18:20 EDT Ventricular Rate:  70 PR Interval:    QRS Duration: 99 QT Interval:  417 QTC Calculation: 450 R Axis:   -52 Text Interpretation:  Sinus rhythm Consider left atrial enlargement LAD, consider left anterior fascicular block No significant change since last tracing Confirmed by Fredia Sorrow [phone]) on 07/03/2019 1:53:04 PM   Results for orders placed or performed during the hospital encounter of 18/84/16  Basic metabolic panel  Result Value Ref Range   Sodium 141 135 - 145 mmol/L   Potassium 3.9 3.5 - 5.1 mmol/L   Chloride 107 98 - 111 mmol/L   CO2 26 22 - 32 mmol/L   Glucose, Bld 94 70 - 99 mg/dL   BUN 12 8 - 23 mg/dL   Creatinine, Ser 0.86 0.44 - 1.00 mg/dL   Calcium 9.3 8.9 - 10.3 mg/dL   GFR calc non Af Amer >60 >60 mL/min   GFR calc Af Amer >60 >60 mL/min   Anion gap 8 5 - 15  CBC  Result Value Ref Range   WBC 6.4 4.0 - 10.5 K/uL   RBC 4.06 3.87 - 5.11 MIL/uL   Hemoglobin 11.6 (L) 12.0 - 15.0 g/dL   HCT 36.7 36.0 - 46.0 %   MCV 90.4 80.0 - 100.0 fL   MCH 28.6 26.0 - 34.0 pg   MCHC 31.6 30.0 - 36.0 g/dL   RDW 14.3 11.5 - 15.5 %   Platelets 342 150 - 400 K/uL   nRBC 0.0 0.0 - 0.2 %  Troponin I (High Sensitivity)  Result Value Ref Range   Troponin I (High Sensitivity) 4.00 <18 ng/L   Dg Chest Portable 1 View  Result Date: 07/03/2019 CLINICAL DATA:  Shortness of breath for 3 days. EXAM: PORTABLE CHEST 1 VIEW COMPARISON:  Chest radiograph 06/06/2019 FINDINGS: Cardiomediastinal silhouette is normal. Mediastinal contours appear intact. Calcific atherosclerotic disease of the aorta. There is no evidence of focal airspace consolidation, pleural effusion or pneumothorax. Mild upper lobe predominant emphysema. Osseous  structures are without acute abnormality. Soft tissues are grossly normal. IMPRESSION: 1. Mild upper lobe predominant emphysema. 2. Calcific atherosclerotic disease of the aorta. Electronically Signed   By: Fidela Salisbury M.D.   On: 07/03/2019 12:55   Dg Chest Port 1 View  Result Date: 06/06/2019 CLINICAL DATA:  Wheezing for 1 day. EXAM: PORTABLE CHEST 1 VIEW COMPARISON:  March 17, 2019 FINDINGS: Cardiomegaly. The hila and mediastinum are normal. No pulmonary nodules, masses, or focal infiltrates. No overt edema. IMPRESSION: No active disease. Electronically Signed   By: Dorise Bullion III M.D   On: 06/06/2019 08:02    Patient seen by me along with physician assistant.  Patient breathing improved here with breathing treatments but she still has the persistent feeling of difficulty with swallowing, does not choke or cough and airway seems to be good based on oxygen levels and voice is good we will have her follow-up with ear nose and throat for an evaluation of the upper airway.  Patient's primary care doctor is Dr. Luan Pulling but he is still not really doing normal office hours.  Patient stable for discharge home.   Fredia Sorrow, MD 07/03/19 1427

## 2019-07-03 NOTE — Discharge Instructions (Signed)
It was my pleasure taking care of you today!   Call the ENT doctor (ear, nose, throat) listed to schedule a follow up appointment for further evaluation.   Return to ER for new or worsening symptoms, any additional concerns.

## 2019-07-09 ENCOUNTER — Other Ambulatory Visit: Payer: Self-pay

## 2019-07-09 NOTE — Patient Outreach (Signed)
[place] [facility]) Care Management  07/09/2019  Katelyn [name] 1941/11/10 [id number]   Successful outreach with Katelyn [name]. She was evaluated in the ED on 07/03/19 for COPD exacerbation. She denies episodes of worsening shortness of breath since being evaluated. She is very independent with ADLs and performs multiple tasks in the home without assistance. She reports the ED visit was likely prompted by overexertion. This week, she reports using her nebulizer immediately after waking each morning and resting frequently throughout the day.  Katelyn [name] remains compliant with medications and treatment recommendations. Per ED summary on 07/03/19, she will require a follow-up with ENT. She is also pending a follow-up with Dr. Luan Pulling next month. She will call if a referral for transportation assistance is needed.  PLAN -Will continue routine outreach.  [place] [phone]

## 2019-07-14 ENCOUNTER — Encounter (HOSPITAL_COMMUNITY): Payer: Self-pay | Admitting: Emergency Medicine

## 2019-07-14 ENCOUNTER — Other Ambulatory Visit: Payer: Self-pay

## 2019-07-14 ENCOUNTER — Emergency Department (HOSPITAL_COMMUNITY)
Admission: EM | Admit: 2019-07-14 | Discharge: 2019-07-14 | Disposition: A | Discharge status: Home / Self Care | Payer: Medicare Other | Attending: Emergency Medicine | Admitting: Emergency Medicine

## 2019-07-14 DIAGNOSIS — F17210 Nicotine dependence, cigarettes, uncomplicated: Secondary | ICD-10-CM | POA: Diagnosis not present

## 2019-07-14 DIAGNOSIS — R0602 Shortness of breath: Secondary | ICD-10-CM | POA: Diagnosis not present

## 2019-07-14 DIAGNOSIS — J441 Chronic obstructive pulmonary disease with (acute) exacerbation: Secondary | ICD-10-CM

## 2019-07-14 DIAGNOSIS — Z79899 Other long term (current) drug therapy: Secondary | ICD-10-CM | POA: Insufficient documentation

## 2019-07-14 DIAGNOSIS — Z9981 Dependence on supplemental oxygen: Secondary | ICD-10-CM | POA: Insufficient documentation

## 2019-07-14 DIAGNOSIS — Z20828 Contact with and (suspected) exposure to other viral communicable diseases: Secondary | ICD-10-CM | POA: Diagnosis not present

## 2019-07-14 DIAGNOSIS — I10 Essential (primary) hypertension: Secondary | ICD-10-CM | POA: Insufficient documentation

## 2019-07-14 LAB — CBC WITH DIFFERENTIAL/PLATELET
Abs Immature Granulocytes: 0.02 10*3/uL (ref 0.00–0.07)
Basophils Absolute: 0.1 10*3/uL (ref 0.0–0.1)
Basophils Relative: 1 %
Eosinophils Absolute: 0.7 10*3/uL — ABNORMAL HIGH (ref 0.0–0.5)
Eosinophils Relative: 10 %
HCT: 37.6 % (ref 36.0–46.0)
Hemoglobin: 11.8 g/dL — ABNORMAL LOW (ref 12.0–15.0)
Immature Granulocytes: 0 %
Lymphocytes Relative: 32 %
Lymphs Abs: 2.2 10*3/uL (ref 0.7–4.0)
MCH: 29.1 pg (ref 26.0–34.0)
MCHC: 31.4 g/dL (ref 30.0–36.0)
MCV: 92.8 fL (ref 80.0–100.0)
Monocytes Absolute: 1 10*3/uL (ref 0.1–1.0)
Monocytes Relative: 14 %
Neutro Abs: 2.9 10*3/uL (ref 1.7–7.7)
Neutrophils Relative %: 43 %
Platelets: 311 10*3/uL (ref 150–400)
RBC: 4.05 MIL/uL (ref 3.87–5.11)
RDW: 13.4 % (ref 11.5–15.5)
WBC: 6.8 10*3/uL (ref 4.0–10.5)
nRBC: 0 % (ref 0.0–0.2)

## 2019-07-14 LAB — BASIC METABOLIC PANEL
Anion gap: 7 (ref 5–15)
BUN: 15 mg/dL (ref 8–23)
CO2: 26 mmol/L (ref 22–32)
Calcium: 8.8 mg/dL — ABNORMAL LOW (ref 8.9–10.3)
Chloride: 109 mmol/L (ref 98–111)
Creatinine, Ser: 0.83 mg/dL (ref 0.44–1.00)
GFR calc Af Amer: >60 mL/min (ref >60)
GFR calc non Af Amer: >60 mL/min (ref >60)
Glucose, Bld: 97 mg/dL (ref 70–99)
Potassium: 3.9 mmol/L (ref 3.5–5.1)
Sodium: 142 mmol/L (ref 135–145)

## 2019-07-14 LAB — SARS CORONAVIRUS 2 BY RT PCR (HOSPITAL ORDER, PERFORMED IN ~~LOC~~ HOSPITAL LAB): SARS Coronavirus 2: NEGATIVE

## 2019-07-14 MED ORDER — PREDNISONE 20 MG PO TABS: ORAL_TABLET | ORAL | 0 refills | Status: DC

## 2019-07-14 MED ORDER — METHYLPREDNISOLONE SODIUM SUCC 125 MG IJ SOLR
125.0000 mg | Freq: Once | INTRAMUSCULAR | Status: AC
  Administered 2019-07-14: 125 mg via INTRAVENOUS
  Filled 2019-07-14: qty 2

## 2019-07-14 MED ORDER — ALBUTEROL SULFATE HFA 108 (90 BASE) MCG/ACT IN AERS
8.0000 | INHALATION_SPRAY | Freq: Once | RESPIRATORY_TRACT | Status: AC
  Administered 2019-07-14: 8 via RESPIRATORY_TRACT
  Filled 2019-07-14: qty 6.7

## 2019-07-14 MED ORDER — MAGNESIUM SULFATE 2 GM/50ML IV SOLN
2.0000 g | Freq: Once | INTRAVENOUS | Status: AC
  Administered 2019-07-14: 2 g via INTRAVENOUS
  Filled 2019-07-14: qty 50

## 2019-07-14 MED ORDER — ALBUTEROL SULFATE (2.5 MG/3ML) 0.083% IN NEBU
5.0000 mg | INHALATION_SOLUTION | Freq: Once | RESPIRATORY_TRACT | Status: AC
  Administered 2019-07-14: 5 mg via RESPIRATORY_TRACT
  Filled 2019-07-14: qty 6

## 2019-07-14 MED ORDER — ALBUTEROL SULFATE (2.5 MG/3ML) 0.083% IN NEBU
5.0000 mg | INHALATION_SOLUTION | Freq: Once | RESPIRATORY_TRACT | Status: AC
  Administered 2019-07-14: 08:00:00 5 mg via RESPIRATORY_TRACT
  Filled 2019-07-14: qty 6

## 2019-07-14 NOTE — ED Notes (Signed)
Son called.  Son is coming to pick pt up.

## 2019-07-14 NOTE — ED Provider Notes (Addendum)
[facility] EMERGENCY DEPARTMENT Provider Note   CSN: [id number] Arrival date & time: 07/14/19  8016  Time seen 4:37 AM  History   Chief Complaint Chief Complaint  Patient presents with  . Shortness of Breath    HPI Katelyn [name] is a 78 y.o. female.   Level 5 caveat for respiratory distress  HPI patient has a history of COPD and is on oxygen at home at 2 L/min nasal cannula.  She states yesterday during the day she started getting more short of breath.  Please note yesterday had a heat index of about 104.  She states she has a new air conditioning system put in her unit about 1 to 2 weeks ago.  She denies cough but then immediately starts coughing, fever, but is aware of wheezing.  She states she quit smoking before.  Patient was seen for the same about July 11. Nurse reports her pulse ox is 90% on room air when she arrived to the ED.  PCP Sinda Du, MD   Past Medical History:  Diagnosis Date  . Asthma   . Chronic neck pain   . Chronic pain of left elbow   . Complication of anesthesia    hard to wake  . COPD (chronic obstructive pulmonary disease) ([place])   . Dysphagia    requiring multiple dilations in past  . Headache    Patient states she needs  eyeglasses  . Irregular heart beat   . S/P colonoscopy 2009   dr. Gala Romney: anal papilla, 2 cecal AVMs. Due in 2014  . S/P endoscopy 2012   Dr. Gala Romney: mild chronic gastritis, bulbar erosions, s/p 54-French Maloney dilation   . S/P endoscopy 2003, 2004, 2007   gastritis, duodenitis, s/p dilation, +H.pylori     Patient Active Problem List   Diagnosis Date Noted  . Acute on chronic respiratory failure with hypoxia ([place]) 03/17/2019  . IDA (iron deficiency anemia) 07/04/2018  . Acute metabolic encephalopathy 55/37/4827  . Acute respiratory failure with hypoxia and hypercapnia ([place]) 07/02/2018  . GERD (gastroesophageal reflux disease) 06/13/2018  . Malnutrition ([place]) 06/13/2018  . Essential hypertension 06/13/2018  .  Tachycardia 04/14/2018  . Acute gastritis 03/31/2018  . Esophagitis 03/31/2018  . Duodenal ulcer 03/31/2018  . Goals of care, counseling/discussion   . DNR (do not resuscitate) discussion   . Palliative care by specialist   . COPD with acute exacerbation ([place]) 03/29/2018  . COPD (chronic obstructive pulmonary disease) ([place]) 02/03/2018  . Hypoxia 11/28/2015  . COPD exacerbation ([place]) 11/28/2015  . Acute respiratory failure with hypoxia ([place]) 11/28/2015  . SOB (shortness of breath) 11/28/2015  . Mucosal abnormality of stomach   . History of colonic polyps 04/25/2015  . 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 Dysphagia 03/11/2011    Past Surgical History:  Procedure Laterality Date  . APPENDECTOMY    . BIOPSY  03/30/2018   Procedure: BIOPSY;  Surgeon: Daneil Dolin, MD;  Location: [facility] ENDO SUITE;  Service: Gastroenterology;;  gastric  . CHOLECYSTECTOMY    . COLONOSCOPY  10/10/2008   RMR: 1. Anal papilla, otherwise normal rectum. 2. Two cecal arteriovenous malformations, colonic mucosa apperaed normal.   . COLONOSCOPY N/A 05/10/2015   RMR: Rectal and colonic polyps removed as described above. Ascending colon AVMs . Rectal polyps were tubular adenomas. Next colonoscopy May 2021.  . ESOPHAGEAL DILATION N/A 05/10/2015   Procedure: ESOPHAGEAL DILATION;  Surgeon: Daneil Dolin, MD;  Location: [facility] ENDO SUITE;  Service: Endoscopy;  Laterality: N/A;  .  ESOPHAGOGASTRODUODENOSCOPY  04/02/2011   RMR: 1. Endoscopically normal -appearing esophagus status post passage of a Maloney dilator followed by biopsy. 2. Hiatal hernia, Mottling, and submucosal petechiae in teh gastric mucoas of uncertain significance status post biopsy. 3. Bulbar erosions as described above.   . ESOPHAGOGASTRODUODENOSCOPY N/A 05/10/2015   RMR: Normal esophagus status post Maloney dilation. Small hiatal hernia. Abnormal gastric because of doubtful clinical significance status post biopsy (benign)  . ESOPHAGOGASTRODUODENOSCOPY (EGD) WITH  PROPOFOL N/A 03/30/2018   Procedure: ESOPHAGOGASTRODUODENOSCOPY (EGD) WITH PROPOFOL;  Surgeon: Daneil Dolin, MD;  Location: [facility] END[facility] SU[facility];  Service: Gastroenterology;  Laterality: N/A;  WITH DILATION   . INGUINAL HERNIA REPAIR    . MALONEY DILATION  03/30/2018   Procedure: Venia Minks DILATION;  Surgeon: Daneil Dolin, MD;  Location: [facility];  Service: Gastroenterology;;  . TONSILLECTOMY    . TOTAL ABDOMINAL HYSTERECTOMY       OB History    Gravida  5   Para  5   Term  4   Preterm  1   AB      Living  4     SAB      TAB      Ectopic      Multiple      Live Births               Home Medications    Prior to Admission medications   Medication Sig Start Date End Date Taking? Authorizing Provider  albuterol (PROVENTIL HFA;VENTOLIN HFA) 108 (90 BASE) MCG/ACT inhaler Inhale 2 puffs into the lungs every 6 (six) hours as needed for wheezing or shortness of breath. 09/04/15   [name], MD  albuterol (PROVENTIL) (2.5 MG/3ML) 0.083% nebulizer solution Take 2.5 mg by nebulization every 4 (four) hours as needed for shortness of breath.    [provider]  azithromycin (ZITHROMAX Z-PAK) 250 MG tablet Take 1 tablet (250 mg total) by mouth daily. 500mg  PO day 1, then 250mg  PO days 205 Patient not taking: Reported on 07/03/2019 06/06/19   Noemi Chapel, MD  ferrous sulfate 325 (65 FE) MG tablet Take 1 tablet (325 mg total) by mouth daily with breakfast. 03/19/19   Sinda Du, MD  Multiple Vitamins-Minerals (MULTIVITAMIN ADULT) CHEW Chew 2 each by mouth daily. Vitafusion gummy    [provider]  predniSONE (DELTASONE) 20 MG tablet Take 3 po QD x 3d , then 2 po QD x 3d then 1 po QD x 3d 07/14/19   Rolland Porter, MD  roflumilast (DALIRESP) 500 MCG TABS tablet Take 0.5 tablets (250 mcg total) by mouth daily. 07/05/18   Sinda Du, MD    Family History Family History  Problem Relation Age of Onset  . Heart disease Mother   . Lung cancer Father   . Cancer  Sister   . Diabetes Brother   . Colon cancer Neg Hx     Social History Social History   Tobacco Use  . Smoking status: Current Every Day Smoker    Packs/day: 1.00    Years: 57.00    Pack years: 57.00    Types: Cigarettes    Start date: 08/18/1959  . Smokeless tobacco: Never Used  Substance Use Topics  . Alcohol use: No    Alcohol/week: 0.0 standard drinks  . Drug use: No     Allergies   Sevoflurane   Review of Systems Review of Systems  Unable to perform ROS: Severe respiratory distress     Physical Exam  Updated Vital Signs BP (!) 153/102 (BP Location: Left Arm)   Pulse (!) 105   Temp (!) 97.5 F (36.4 C)   Resp (!) 26   SpO2 97%   Vital signs normal except for hypertension and tachycardia   Physical Exam Vitals signs and nursing note reviewed.  Constitutional:      Comments: Very thin elderly frail woman  HENT:     Head: Normocephalic and atraumatic.     Right Ear: External ear normal.     Left Ear: External ear normal.     Nose: Nose normal.     Mouth/Throat:     Mouth: Mucous membranes are dry.  Eyes:     Extraocular Movements: Extraocular movements intact.     Conjunctiva/sclera: Conjunctivae normal.     Pupils: Pupils are equal, round, and reactive to light.  Cardiovascular:     Rate and Rhythm: Regular rhythm. Tachycardia present.     Heart sounds: Normal heart sounds.  Pulmonary:     Effort: Tachypnea, accessory muscle usage and respiratory distress present.     Breath sounds: Decreased air movement present. Wheezing present.  Musculoskeletal: Normal range of motion.        General: No swelling.  Skin:    General: Skin is warm and dry.     Capillary Refill: Capillary refill takes less than 2 seconds.     Coloration: Skin is pale.  Neurological:     General: No focal deficit present.     Mental Status: She is oriented to person, place, and time.     Cranial Nerves: No cranial nerve deficit.  Psychiatric:        Mood and Affect: Mood  normal.        Behavior: Behavior normal.        Thought Content: Thought content normal.      ED Treatments / Results  Labs (all labs ordered are listed, but only abnormal results are displayed) Results for orders placed or performed during the hospital encounter of 07/14/19  SARS Coronavirus 2 (CEPHEID - Performed in [place] hospital lab), [facility] Order   Specimen: Nasopharyngeal Swab  Result Value Ref Range   SARS Coronavirus 2 NEGATIVE NEGATIVE  Basic metabolic panel  Result Value Ref Range   Sodium 142 135 - 145 mmol/L   Potassium 3.9 3.5 - 5.1 mmol/L   Chloride 109 98 - 111 mmol/L   CO2 26 22 - 32 mmol/L   Glucose, Bld 97 70 - 99 mg/dL   BUN 15 8 - 23 mg/dL   Creatinine, Ser 0.83 0.44 - 1.00 mg/dL   Calcium 8.8 (L) 8.9 - 10.3 mg/dL   GFR calc non Af Amer >60 >60 mL/min   GFR calc Af Amer >60 >60 mL/min   Anion gap 7 5 - 15  CBC with Differential  Result Value Ref Range   WBC 6.8 4.0 - 10.5 K/uL   RBC 4.05 3.87 - 5.11 MIL/uL   Hemoglobin 11.8 (L) 12.0 - 15.0 g/dL   HCT 37.6 36.0 - 46.0 %   MCV 92.8 80.0 - 100.0 fL   MCH 29.1 26.0 - 34.0 pg   MCHC 31.4 30.0 - 36.0 g/dL   RDW 13.4 11.5 - 15.5 %   Platelets 311 150 - 400 K/uL   nRBC 0.0 0.0 - 0.2 %   Neutrophils Relative % 43 %   Neutro Abs 2.9 1.7 - 7.7 K/uL   Lymphocytes Relative 32 %   Lymphs Abs 2.2 0.7 -  4.0 K/uL   Monocytes Relative 14 %   Monocytes Absolute 1.0 0.1 - 1.0 K/uL   Eosinophils Relative 10 %   Eosinophils Absolute 0.7 (H) 0.0 - 0.5 K/uL   Basophils Relative 1 %   Basophils Absolute 0.1 0.0 - 0.1 K/uL   Immature Granulocytes 0 %   Abs Immature Granulocytes 0.02 0.00 - 0.07 K/uL    Laboratory interpretation all normal except mild anemia    EKG None  Radiology No results found.   Dg Chest Portable 1 View  Result Date: 07/03/2019 CLINICAL DATA:  Shortness of breath for 3 days.  IMPRESSION: 1. Mild upper lobe predominant emphysema. 2. Calcific atherosclerotic disease of the aorta.  Electronically Signed   By: Fidela Salisbury M.D.   On: 07/03/2019 12:55   Procedures .Critical Care Performed by: Rolland Porter, MD Authorized by: Rolland Porter, MD   Critical care provider statement:    Critical care time (minutes):  31   Critical care was necessary to treat or prevent imminent or life-threatening deterioration of the following conditions:  Respiratory failure   Critical care was time spent personally by me on the following activities:  Examination of patient, obtaining history from patient or surrogate, ordering and review of laboratory studies, pulse oximetry, re-evaluation of patient's condition and review of old charts   (including critical care time)  Medications Ordered in ED Medications  albuterol (PROVENTIL) (2.5 MG/3ML) 0.083% nebulizer solution 5 mg (has no administration in time range)  albuterol (VENTOLIN HFA) 108 (90 Base) MCG/ACT inhaler 8 puff (8 puffs Inhalation Given 07/14/19 0453)  methylPREDNISolone sodium succinate (SOLU-MEDROL) 125 mg/2 mL injection 125 mg (125 mg Intravenous Given 07/14/19 0453)  magnesium sulfate IVPB 2 g 50 mL (0 g Intravenous Stopped 07/14/19 0607)  albuterol (PROVENTIL) (2.5 MG/3ML) 0.083% nebulizer solution 5 mg (5 mg Nebulization Given 07/14/19 0615)     Initial Impression / Assessment and Plan / ED Course  I have reviewed the triage vital signs and the nursing notes.  Pertinent labs & imaging results that were available during my care of the patient were reviewed by me and considered in my medical decision making (see chart for details).     COVID-19 tested was initiated so patient could get nebulizer treatments as soon as possible.  Meanwhile she was given an albuterol inhaler 8 puffs, IV Solu-Medrol, and IV magnesium after I checked her creatinine level from July 11.  It was normal.  Recheck at 5:25 AM patient's resting comfortably in a sitting position on a stretcher now.  She states she feels much better.  She has had her  inhaler x8 puffs, Solu-Medrol IV and her IV magnesium is almost completed.  When I listen to her she now has improved air movement, she has rare end expiratory wheezing.  Her breath sounds are diminished diffusely however I think that is from her underlying COPD.  Her COVID-19 test is still not back.  When it returns we will do another nebulizer but I think she will be able to go home after that.  We discussed that during this heat way that people with underlying lung disease are go have a harder time with breathing.  6 AM patient's QPYPP-50 is negative, a albuterol nebulizer was ordered.  Recheck at 6:45 AM patient is sleeping in no distress.  She states she is feeling better after her nebulizer.  When I listen to her she continues to have improved air movement however she still has some distinct end expiratory wheezing  especially at the bases.  A second albuterol nebulizer was ordered.   7 am Patient left at change of shift with Dr Roderic Palau  to recheck after her second nebulizer to see if she is ready to be discharged.  I suspect she will be.  [place] Gustafson Carras was evaluated in Emergency Department on 07/14/2019 for the symptoms described in the history of present illness. She was evaluated in the context of the global COVID-19 pandemic, which necessitated consideration that the patient might be at risk for infection with the SARS-CoV-2 virus that causes COVID-19. Institutional protocols and algorithms that pertain to the evaluation of patients at risk for COVID-19 are in a state of rapid change based on information released by regulatory bodies including the CDC and federal and state organizations. These policies and algorithms were followed during the patient's care in the ED.     Final Clinical Impressions(s) / ED Diagnoses   Final diagnoses:  COPD exacerbation [facility])    ED Discharge Orders         Ordered    predniSONE (DELTASONE) 20 MG tablet     07/14/19 0646         Plan discharge  Rolland Porter, MD, Barbette Or, MD 07/14/19 [place], [place], MD 07/14/19 [phone]

## 2019-07-14 NOTE — ED Triage Notes (Signed)
Pt C/O SOB that started yesterday afternoon. Pt denies pain. Pt 90% on RA.

## 2019-07-14 NOTE — Discharge Instructions (Addendum)
Try to stay in the house in the air conditioning while we are having this heat wave.  Take the steroids until gone.  Use your inhalers at home for your wheezing.  Talk to your doctor's office about getting a nebulizer machine to use at home.  Recheck if you get worse.

## 2019-07-14 NOTE — ED Provider Notes (Signed)
Patient having minimal wheezing and O2 sat 100% she wants to be discharged home   Milton Ferguson, MD 07/14/19 [phone]

## 2019-07-14 NOTE — ED Notes (Signed)
RT at bedside.

## 2019-08-03 ENCOUNTER — Other Ambulatory Visit: Payer: Self-pay

## 2019-08-03 NOTE — Patient Outreach (Signed)
[facility]) Care Management  08/03/2019  Katelyn [name] [birth year] [id number]    Successful outreach with Katelyn [name]. She was evaluated in the Emergency Department on 07/14/19 due to COPD exacerbation.   She reports feeling well today. Denies complaints of shortness of breath. Reports ambulating outdoors earlier today without difficulty. States the COPD exacerbation on 07/14/19 was likely due to humid weather. She remains compliant with medications and treatment recommendations. Pending follow-up with Dr. Luan Pulling on 08/17/19.  Katelyn [name] denies urgent concerns but may require transportation assistance on 08/17/19. She is agreeable to a follow-up outreach next week. Will confirm and submit request for assistance if needed. Current Outpatient Medications on File Prior to Visit  Medication Sig Dispense Refill  . albuterol (PROVENTIL HFA;VENTOLIN HFA) 108 (90 BASE) MCG/ACT inhaler Inhale 2 puffs into the lungs every 6 (six) hours as needed for wheezing or shortness of breath. 1 Inhaler 1  . albuterol (PROVENTIL) (2.5 MG/3ML) 0.083% nebulizer solution Take 2.5 mg by nebulization every 4 (four) hours as needed for shortness of breath.    Marland Kitchen azithromycin (ZITHROMAX Z-PAK) 250 MG tablet Take 1 tablet (250 mg total) by mouth daily. 500mg  PO day 1, then 250mg  PO days 205 (Patient not taking: Reported on 07/03/2019) 6 tablet 0  . ferrous sulfate 325 (65 FE) MG tablet Take 1 tablet (325 mg total) by mouth daily with breakfast. 30 tablet 3  . Multiple Vitamins-Minerals (MULTIVITAMIN ADULT) CHEW Chew 2 each by mouth daily. Vitafusion gummy    . predniSONE (DELTASONE) 20 MG tablet Take 3 po QD x 3d , then 2 po QD x 3d then 1 po QD x 3d (Patient not taking: Reported on 08/03/2019) 18 tablet 0  . roflumilast (DALIRESP) 500 MCG TABS tablet Take 0.5 tablets (250 mcg total) by mouth daily. 30 tablet 12   No current facility-administered medications on file prior to visit.     PLAN -Will follow-up  next week.   [place] [phone]

## 2019-08-13 ENCOUNTER — Other Ambulatory Visit: Payer: Self-pay

## 2019-08-13 NOTE — Patient Outreach (Signed)
[facility]) Care Management  08/13/2019  LALISA SADOWSKY Aug 31, 1941 [id number]    Follow-up regarding transportation. Ms. Stonebreaker is scheduled for a clinic visit with Dr. Luan Pulling on 08/17/19. She anticipates her son or grandson being available to provide transportation. Denies current need for transportation assistance.  PLAN -Will continue routine outreach.   [place] [facility] Management [phone]

## 2019-08-15 ENCOUNTER — Encounter (HOSPITAL_COMMUNITY): Payer: Self-pay | Admitting: Emergency Medicine

## 2019-08-15 ENCOUNTER — Emergency Department (HOSPITAL_COMMUNITY): Payer: Medicare Other

## 2019-08-15 ENCOUNTER — Other Ambulatory Visit: Payer: Self-pay

## 2019-08-15 ENCOUNTER — Emergency Department (HOSPITAL_COMMUNITY)
Admission: EM | Admit: 2019-08-15 | Discharge: 2019-08-16 | Disposition: A | Discharge status: Home / Self Care | Payer: Medicare Other | Attending: Emergency Medicine | Admitting: Emergency Medicine

## 2019-08-15 DIAGNOSIS — I10 Essential (primary) hypertension: Secondary | ICD-10-CM | POA: Insufficient documentation

## 2019-08-15 DIAGNOSIS — Z20828 Contact with and (suspected) exposure to other viral communicable diseases: Secondary | ICD-10-CM | POA: Insufficient documentation

## 2019-08-15 DIAGNOSIS — Z87891 Personal history of nicotine dependence: Secondary | ICD-10-CM | POA: Diagnosis not present

## 2019-08-15 DIAGNOSIS — R05 Cough: Secondary | ICD-10-CM | POA: Diagnosis not present

## 2019-08-15 DIAGNOSIS — J441 Chronic obstructive pulmonary disease with (acute) exacerbation: Secondary | ICD-10-CM | POA: Diagnosis not present

## 2019-08-15 DIAGNOSIS — R000 Tachycardia, unspecified: Secondary | ICD-10-CM | POA: Diagnosis not present

## 2019-08-15 DIAGNOSIS — Z79899 Other long term (current) drug therapy: Secondary | ICD-10-CM | POA: Diagnosis not present

## 2019-08-15 DIAGNOSIS — R0603 Acute respiratory distress: Secondary | ICD-10-CM | POA: Diagnosis present

## 2019-08-15 DIAGNOSIS — R0602 Shortness of breath: Secondary | ICD-10-CM | POA: Diagnosis not present

## 2019-08-15 LAB — CBC
HCT: 38.2 % (ref 36.0–46.0)
Hemoglobin: 12.1 g/dL (ref 12.0–15.0)
MCH: 29.4 pg (ref 26.0–34.0)
MCHC: 31.7 g/dL (ref 30.0–36.0)
MCV: 92.9 fL (ref 80.0–100.0)
Platelets: 367 10*3/uL (ref 150–400)
RBC: 4.11 MIL/uL (ref 3.87–5.11)
RDW: 13.4 % (ref 11.5–15.5)
WBC: 8.4 10*3/uL (ref 4.0–10.5)
nRBC: 0 % (ref 0.0–0.2)

## 2019-08-15 LAB — BASIC METABOLIC PANEL
Anion gap: 10 (ref 5–15)
BUN: 15 mg/dL (ref 8–23)
CO2: 25 mmol/L (ref 22–32)
Calcium: 9.4 mg/dL (ref 8.9–10.3)
Chloride: 109 mmol/L (ref 98–111)
Creatinine, Ser: 0.88 mg/dL (ref 0.44–1.00)
GFR calc Af Amer: >60 mL/min (ref >60)
GFR calc non Af Amer: >60 mL/min (ref >60)
Glucose, Bld: 110 mg/dL — ABNORMAL HIGH (ref 70–99)
Potassium: 4.5 mmol/L (ref 3.5–5.1)
Sodium: 144 mmol/L (ref 135–145)

## 2019-08-15 LAB — SARS CORONAVIRUS 2 BY RT PCR (HOSPITAL ORDER, PERFORMED IN ~~LOC~~ HOSPITAL LAB): SARS Coronavirus 2: NEGATIVE

## 2019-08-15 MED ORDER — ALBUTEROL (5 MG/ML) CONTINUOUS INHALATION SOLN
10.0000 mg/h | INHALATION_SOLUTION | RESPIRATORY_TRACT | Status: AC
  Administered 2019-08-15: 10 mg/h via RESPIRATORY_TRACT
  Filled 2019-08-15: qty 20

## 2019-08-15 MED ORDER — ALBUTEROL SULFATE HFA 108 (90 BASE) MCG/ACT IN AERS
INHALATION_SPRAY | RESPIRATORY_TRACT | Status: AC
  Administered 2019-08-15: 8
  Filled 2019-08-15: qty 6.7

## 2019-08-15 MED ORDER — ALBUTEROL SULFATE HFA 108 (90 BASE) MCG/ACT IN AERS: 4.0000 | INHALATION_SPRAY | RESPIRATORY_TRACT | Status: DC

## 2019-08-15 MED ORDER — IPRATROPIUM-ALBUTEROL 0.5-2.5 (3) MG/3ML IN SOLN: 3.0000 mL | Freq: Once | RESPIRATORY_TRACT | Status: DC

## 2019-08-15 MED ORDER — METHYLPREDNISOLONE SODIUM SUCC 125 MG IJ SOLR
125.0000 mg | Freq: Once | INTRAMUSCULAR | Status: AC
  Administered 2019-08-15: 125 mg via INTRAVENOUS
  Filled 2019-08-15: qty 2

## 2019-08-15 MED ORDER — PREDNISONE 20 MG PO TABS: 40.0000 mg | ORAL_TABLET | Freq: Every day | ORAL | 0 refills | Status: DC

## 2019-08-15 MED ORDER — AEROCHAMBER PLUS FLO-VU MEDIUM MISC
1.0000 | Freq: Once | Status: AC
  Administered 2019-08-15: 1

## 2019-08-15 MED ORDER — IPRATROPIUM-ALBUTEROL 0.5-2.5 (3) MG/3ML IN SOLN
3.0000 mL | Freq: Once | RESPIRATORY_TRACT | Status: AC
  Administered 2019-08-15: 3 mL via RESPIRATORY_TRACT
  Filled 2019-08-15: qty 3

## 2019-08-15 NOTE — ED Triage Notes (Signed)
Patient having respiratory distress with audible wheezing that started today. Patient is on home oxygen at 4 liters. Patient has hx of COPD.

## 2019-08-15 NOTE — ED Notes (Signed)
Pt. Ambulated well needing little to no assistance. O2 saturation was maintaining 97% or higher; however, the Pt. Pulse elevated to 126 from 103 BPM. Pt. Noted that she felt better, and had no noticeable wheezing. Pt. Felt she still had something in her throat.

## 2019-08-15 NOTE — ED Provider Notes (Signed)
[facility] EMERGENCY DEPARTMENT Provider Note   CSN: [id number] Arrival date & time: 08/15/19  1940     History   Chief Complaint Chief Complaint  Patient presents with  . Respiratory Distress    HPI Katelyn [name] is a 78 y.o. female.     HPI  This patient is a 78 year old female, she has known severe COPD, in fact my review of the medical record shows that this patient was seen in the hospital on July 22, July 11, June 14, all for COPD exacerbations.  She shakes her head when I asked her if she still smokes yet at each visit prior to this she stated that she was smoking 1 pack/day.  She reports that she has been on oxygen and states that the oxygen has not been helping her, she has been using multiple treatments today without improvement, she denies any other abnormal symptoms including fevers or increasing in cough compared to her normal cough.  She denies swelling of the legs, denies any recent travel, she is speaking in 1-2 word sentences and is in severe respiratory distress, history is limited by the patient's critical acute illness.  Past Medical History:  Diagnosis Date  . Asthma   . Chronic neck pain   . Chronic pain of left elbow   . Complication of anesthesia    hard to wake  . COPD (chronic obstructive pulmonary disease) ([place])   . Dysphagia    requiring multiple dilations in past  . Headache    Patient states she needs  eyeglasses  . Irregular heart beat   . S/P colonoscopy 2009   dr. Gala Romney: anal papilla, 2 cecal AVMs. Due in 2014  . S/P endoscopy 2012   Dr. Gala Romney: mild chronic gastritis, bulbar erosions, s/p 54-French Maloney dilation   . S/P endoscopy 2003, 2004, 2007   gastritis, duodenitis, s/p dilation, +H.pylori     Patient Active Problem List   Diagnosis Date Noted  . Acute on chronic respiratory failure with hypoxia ([place]) 03/17/2019  . IDA (iron deficiency anemia) 07/04/2018  . Acute metabolic encephalopathy [id number]  . Acute respiratory  failure with hypoxia and hypercapnia ([place]) 07/02/2018  . GERD (gastroesophageal reflux disease) 06/13/2018  . Malnutrition ([place]) 06/13/2018  . Essential hypertension 06/13/2018  . Tachycardia 04/14/2018  . Acute gastritis 03/31/2018  . Esophagitis 03/31/2018  . Duodenal ulcer 03/31/2018  . Goals of care, counseling/discussion   . DNR (do not resuscitate) discussion   . Palliative care by specialist   . COPD with acute exacerbation ([place]) 03/29/2018  . COPD (chronic obstructive pulmonary disease) ([place]) 02/03/2018  . Hypoxia 11/28/2015  . COPD exacerbation ([place]) 11/28/2015  . Acute respiratory failure with hypoxia ([place]) 11/28/2015  . SOB (shortness of breath) 11/28/2015  . Mucosal abnormality of stomach   . History of colonic polyps 04/25/2015  . 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 Dysphagia 03/11/2011    Past Surgical History:  Procedure Laterality Date  . APPENDECTOMY    . BIOPSY  03/30/2018   Procedure: BIOPSY;  Surgeon: Daneil Dolin, MD;  Location: [facility];  Service: Gastroenterology;;  gastric  . CHOLECYSTECTOMY    . COLONOSCOPY  10/10/2008   RMR: 1. Anal papilla, otherwise normal rectum. 2. Two cecal arteriovenous malformations, colonic mucosa apperaed normal.   . COLONOSCOPY N/A 05/10/2015   RMR: Rectal and colonic polyps removed as described above. Ascending colon AVMs . Rectal polyps were tubular adenomas. Next colonoscopy May 2021.  . ESOPHAGEAL DILATION N/A 05/10/2015  Procedure: ESOPHAGEAL DILATION;  Surgeon: Daneil Dolin, MD;  Location: [facility];  Service: Endoscopy;  Laterality: N/A;  . ESOPHAGOGASTRODUODENOSCOPY  04/02/2011   RMR: 1. Endoscopically normal -appearing esophagus status post passage of a Maloney dilator followed by biopsy. 2. Hiatal hernia, Mottling, and submucosal petechiae in teh gastric mucoas of uncertain significance status post biopsy. 3. Bulbar erosions as described above.   . ESOPHAGOGASTRODUODENOSCOPY N/A 05/10/2015   RMR: Normal  esophagus status post Maloney dilation. Small hiatal hernia. Abnormal gastric because of doubtful clinical significance status post biopsy (benign)  . ESOPHAGOGASTRODUODENOSCOPY (EGD) WITH PROPOFOL N/A 03/30/2018   Procedure: ESOPHAGOGASTRODUODENOSCOPY (EGD) WITH PROPOFOL;  Surgeon: Daneil Dolin, MD;  Location: [facility];  Service: Gastroenterology;  Laterality: N/A;  WITH DILATION   . INGUINAL HERNIA REPAIR    . MALONEY DILATION  03/30/2018   Procedure: Venia Minks DILATION;  Surgeon: Daneil Dolin, MD;  Location: [facility];  Service: Gastroenterology;;  . TONSILLECTOMY    . TOTAL ABDOMINAL HYSTERECTOMY       OB History    Gravida  5   Para  5   Term  4   Preterm  1   AB      Living  4     SAB      TAB      Ectopic      Multiple      Live Births               Home Medications    Prior to Admission medications   Medication Sig Start Date End Date Taking? Authorizing Provider  albuterol (PROVENTIL HFA;VENTOLIN HFA) 108 (90 BASE) MCG/ACT inhaler Inhale 2 puffs into the lungs every 6 (six) hours as needed for wheezing or shortness of breath. 09/04/15  Yes [name], MD  albuterol (PROVENTIL) (2.5 MG/3ML) 0.083% nebulizer solution Take 2.5 mg by nebulization every 4 (four) hours as needed for shortness of breath.   Yes [provider]  ferrous sulfate 325 (65 FE) MG tablet Take 1 tablet (325 mg total) by mouth daily with breakfast. 03/19/19  Yes Sinda Du, MD  Multiple Vitamins-Minerals (MULTIVITAMIN ADULT) CHEW Chew 2 each by mouth daily. Vitafusion gummy   Yes [provider]  OXYGEN Inhale 4 L into the lungs daily.   Yes [provider]  roflumilast (DALIRESP) 500 MCG TABS tablet Take 0.5 tablets (250 mcg total) by mouth daily. 07/05/18  Yes Sinda Du, MD  predniSONE (DELTASONE) 20 MG tablet Take 2 tablets (40 mg total) by mouth daily. 08/15/19   Noemi Chapel, MD    Family History Family History  Problem Relation Age  of Onset  . Heart disease Mother   . Lung cancer Father   . Cancer Sister   . Diabetes Brother   . Colon cancer Neg Hx     Social History Social History   Tobacco Use  . Smoking status: Former Smoker    Packs/day: 1.00    Years: 57.00    Pack years: 57.00    Types: Cigarettes    Start date: 08/18/1959    Quit date: 08/14/2016    Years since quitting: 3.0  . Smokeless tobacco: Never Used  Substance Use Topics  . Alcohol use: No    Alcohol/week: 0.0 standard drinks  . Drug use: No     Allergies   Sevoflurane   Review of Systems Review of Systems  Unable to perform ROS: Acuity of condition     Physical  Exam Updated Vital Signs BP (!) 103/57   Pulse (!) 103   Temp 98 F (36.7 C) (Oral)   Resp 19   Ht 1.626 m (5\' 4" )   Wt 42.6 kg   SpO2 98%   BMI 16.14 kg/m   Physical Exam Vitals signs and nursing note reviewed.  Constitutional:      General: She is in acute distress.     Appearance: She is well-developed. She is ill-appearing.  HENT:     Head: Normocephalic and atraumatic.     Mouth/Throat:     Pharynx: No oropharyngeal exudate.  Eyes:     General: No scleral icterus.       Right eye: No discharge.        Left eye: No discharge.     Conjunctiva/sclera: Conjunctivae normal.     Pupils: Pupils are equal, round, and reactive to light.  Neck:     Musculoskeletal: Normal range of motion and neck supple.     Thyroid: No thyromegaly.     Vascular: No JVD.  Cardiovascular:     Rate and Rhythm: Regular rhythm. Tachycardia present.     Heart sounds: Normal heart sounds. No murmur. No friction rub. No gallop.      Comments: Tachycardic to 120 bpm, no murmur Pulmonary:     Effort: Respiratory distress present.     Breath sounds: Wheezing present. No rales.     Comments: The patient is speaking in 1-2 word sentences with accessory muscle use, severe prolonged expiratory phase and diffuse wheezing.  No rales Abdominal:     General: Bowel sounds are normal.  There is no distension.     Palpations: Abdomen is soft. There is no mass.     Tenderness: There is no abdominal tenderness.  Musculoskeletal: Normal range of motion.        General: No tenderness.  Lymphadenopathy:     Cervical: No cervical adenopathy.  Skin:    General: Skin is warm and dry.     Findings: No erythema or rash.  Neurological:     Mental Status: She is alert.     Coordination: Coordination normal.  Psychiatric:        Behavior: Behavior normal.      ED Treatments / Results  Labs (all labs ordered are listed, but only abnormal results are displayed) Labs Reviewed  BASIC METABOLIC PANEL - Abnormal; Notable for the following components:      Result Value   Glucose, Bld 110 (*)    All other components within normal limits  SARS CORONAVIRUS 2 (HOSPITAL ORDER, [facility])  CBC    EKG EKG Interpretation  Date/Time:  Sunday August 15 2019 19:59:05 EDT Ventricular Rate:  117 PR Interval:    QRS Duration: 96 QT Interval:  316 QTC Calculation: 441 R Axis:   4 Text Interpretation:  Sinus tachycardia Anteroseptal infarct, age indeterminate Artifact in lead(s) I II III aVL aVF V2 V5 V6 Since last tracing rate faster Confirmed by Noemi Chapel [phone]) on 08/15/2019 8:05:38 PM   Radiology Dg Chest Port 1 View  Result Date: 08/15/2019 CLINICAL DATA:  Cough and shortness of breath EXAM: PORTABLE CHEST 1 VIEW COMPARISON:  07/03/2019 FINDINGS: The heart size and mediastinal contours are within normal limits. Both lungs are clear. The visualized skeletal structures are unremarkable. IMPRESSION: No active disease. Electronically Signed   By: Ulyses Jarred M.D.   On: 08/15/2019 21:37    Procedures Procedures (including critical care  time)  Medications Ordered in ED Medications  albuterol (PROVENTIL,VENTOLIN) solution continuous neb (10 mg/hr Nebulization New Bag/Given 08/15/19 2120)  albuterol (VENTOLIN HFA) 108 (90 Base) MCG/ACT inhaler 4  puff (0 puffs Inhalation Hold 08/15/19 2015)  ipratropium-albuterol (DUONEB) 0.5-2.5 (3) MG/3ML nebulizer solution 3 mL (0 mLs Nebulization Hold 08/15/19 2138)  ipratropium-albuterol (DUONEB) 0.5-2.5 (3) MG/3ML nebulizer solution 3 mL (3 mLs Nebulization Given 08/15/19 2120)  methylPREDNISolone sodium succinate (SOLU-MEDROL) 125 mg/2 mL injection 125 mg (125 mg Intravenous Given 08/15/19 2018)  albuterol (VENTOLIN HFA) 108 (90 Base) MCG/ACT inhaler (8 puffs  Given 08/15/19 2009)  AeroChamber Plus Flo-Vu Medium MISC 1 each (1 each Other Given 08/15/19 2014)     Initial Impression / Assessment and Plan / ED Course  I have reviewed the triage vital signs and the nursing notes.  Pertinent labs & imaging results that were available during my care of the patient were reviewed by me and considered in my medical decision making (see chart for details).  Clinical Course as of Aug 15 2243  Nancy Fetter Aug 15, 2019  2241 CXR without acute findings Ambulated without difficulty As per prior visits, she turns very quickly Stable for d/c Agreeable to return for worsening sx.   [BM]    Clinical Course User Index [BM] Noemi Chapel, MD       This patient's exam is consistent with severe reactive airway disease and obstructive pulmonary disease.  She will need to have continuous nebulizer therapy due to her respiratory distress.  That being said her oxygen is 99% on 3 L by nasal cannula.  She is having difficulty even speaking 1-2 word sentences.  Will reevaluate.  She has not yet been tested for COVID for this evaluation however she is afebrile, denies exposures, and at this point will need to have therapy without delay thus metered-dose inhaler with spacer will be used.  Current hospital policy is to not allow nebulizer treatments on unknown CODE STATUS.  On multiple repeat evaluations the patient improved quickly and spontaneously.  Oxygen rose to 100% from 99%, work of breathing decreased and she is now speaking  in full sentences and is walking laps around the emergency department without any respiratory distress whatsoever.  This is consistent with previous visits, she turns very quickly.  Labs are normal, chest x-ray without pneumothorax or pneumonia, stable for discharge.  Patient agreeable to return should symptoms worsen.  Final Clinical Impressions(s) / ED Diagnoses   Final diagnoses:  COPD exacerbation [facility])    ED Discharge Orders         Ordered    predniSONE (DELTASONE) 20 MG tablet  Daily     08/15/19 2242           Noemi Chapel, MD 08/15/19 2244

## 2019-08-15 NOTE — Discharge Instructions (Signed)
Prednisone daily for 5 days  Albuterol every 4 hours for the next 24 hours, then every 4 hours as needed.  Seek a medical exam for any severe or worsening symptoms.  Emergency department for increasing shortness of breath, cough, fever, weakness or any other severe symptoms

## 2019-08-17 DIAGNOSIS — J441 Chronic obstructive pulmonary disease with (acute) exacerbation: Secondary | ICD-10-CM | POA: Diagnosis not present

## 2019-08-17 DIAGNOSIS — J9611 Chronic respiratory failure with hypoxia: Secondary | ICD-10-CM | POA: Diagnosis not present

## 2019-08-17 DIAGNOSIS — D500 Iron deficiency anemia secondary to blood loss (chronic): Secondary | ICD-10-CM | POA: Diagnosis not present

## 2019-08-17 DIAGNOSIS — F419 Anxiety disorder, unspecified: Secondary | ICD-10-CM | POA: Diagnosis not present

## 2019-08-25 ENCOUNTER — Emergency Department (HOSPITAL_COMMUNITY)
Admission: EM | Admit: 2019-08-25 | Discharge: 2019-08-25 | Disposition: A | Discharge status: Home / Self Care | Payer: Medicare Other | Attending: Emergency Medicine | Admitting: Emergency Medicine

## 2019-08-25 ENCOUNTER — Emergency Department (HOSPITAL_COMMUNITY): Payer: Medicare Other

## 2019-08-25 ENCOUNTER — Encounter (HOSPITAL_COMMUNITY): Payer: Self-pay | Admitting: Emergency Medicine

## 2019-08-25 ENCOUNTER — Other Ambulatory Visit: Payer: Self-pay

## 2019-08-25 DIAGNOSIS — R0602 Shortness of breath: Secondary | ICD-10-CM | POA: Diagnosis not present

## 2019-08-25 DIAGNOSIS — Z20828 Contact with and (suspected) exposure to other viral communicable diseases: Secondary | ICD-10-CM | POA: Diagnosis not present

## 2019-08-25 DIAGNOSIS — J441 Chronic obstructive pulmonary disease with (acute) exacerbation: Secondary | ICD-10-CM | POA: Insufficient documentation

## 2019-08-25 DIAGNOSIS — R0789 Other chest pain: Secondary | ICD-10-CM | POA: Diagnosis not present

## 2019-08-25 DIAGNOSIS — Z79899 Other long term (current) drug therapy: Secondary | ICD-10-CM | POA: Diagnosis not present

## 2019-08-25 DIAGNOSIS — Z87891 Personal history of nicotine dependence: Secondary | ICD-10-CM | POA: Insufficient documentation

## 2019-08-25 LAB — CBC WITH DIFFERENTIAL/PLATELET
Abs Immature Granulocytes: 0.02 10*3/uL (ref 0.00–0.07)
Basophils Absolute: 0.1 10*3/uL (ref 0.0–0.1)
Basophils Relative: 1 %
Eosinophils Absolute: 0.7 10*3/uL — ABNORMAL HIGH (ref 0.0–0.5)
Eosinophils Relative: 9 %
HCT: 39.8 % (ref 36.0–46.0)
Hemoglobin: 12.2 g/dL (ref 12.0–15.0)
Immature Granulocytes: 0 %
Lymphocytes Relative: 31 %
Lymphs Abs: 2.4 10*3/uL (ref 0.7–4.0)
MCH: 29.3 pg (ref 26.0–34.0)
MCHC: 30.7 g/dL (ref 30.0–36.0)
MCV: 95.7 fL (ref 80.0–100.0)
Monocytes Absolute: 0.9 10*3/uL (ref 0.1–1.0)
Monocytes Relative: 11 %
Neutro Abs: 3.7 10*3/uL (ref 1.7–7.7)
Neutrophils Relative %: 48 %
Platelets: 276 10*3/uL (ref 150–400)
RBC: 4.16 MIL/uL (ref 3.87–5.11)
RDW: 13.7 % (ref 11.5–15.5)
WBC: 7.6 10*3/uL (ref 4.0–10.5)
nRBC: 0 % (ref 0.0–0.2)

## 2019-08-25 LAB — SARS CORONAVIRUS 2 BY RT PCR (HOSPITAL ORDER, PERFORMED IN ~~LOC~~ HOSPITAL LAB): SARS Coronavirus 2: NEGATIVE

## 2019-08-25 LAB — BASIC METABOLIC PANEL
Anion gap: 6 (ref 5–15)
BUN: 20 mg/dL (ref 8–23)
CO2: 27 mmol/L (ref 22–32)
Calcium: 8.7 mg/dL — ABNORMAL LOW (ref 8.9–10.3)
Chloride: 108 mmol/L (ref 98–111)
Creatinine, Ser: 0.88 mg/dL (ref 0.44–1.00)
GFR calc Af Amer: >60 mL/min (ref >60)
GFR calc non Af Amer: >60 mL/min (ref >60)
Glucose, Bld: 111 mg/dL — ABNORMAL HIGH (ref 70–99)
Potassium: 4.4 mmol/L (ref 3.5–5.1)
Sodium: 141 mmol/L (ref 135–145)

## 2019-08-25 LAB — TROPONIN I (HIGH SENSITIVITY): Troponin I (High Sensitivity): 5 ng/L (ref <18)

## 2019-08-25 MED ORDER — METHYLPREDNISOLONE SODIUM SUCC 125 MG IJ SOLR
80.0000 mg | Freq: Once | INTRAMUSCULAR | Status: AC
  Administered 2019-08-25: 06:00:00 80 mg via INTRAVENOUS
  Filled 2019-08-25: qty 2

## 2019-08-25 MED ORDER — DOXYCYCLINE HYCLATE 100 MG PO CAPS: 100.0000 mg | ORAL_CAPSULE | Freq: Two times a day (BID) | ORAL | 0 refills | Status: DC

## 2019-08-25 MED ORDER — MAGNESIUM SULFATE 2 GM/50ML IV SOLN
2.0000 g | Freq: Once | INTRAVENOUS | Status: AC
  Administered 2019-08-25: 06:00:00 2 g via INTRAVENOUS
  Filled 2019-08-25: qty 50

## 2019-08-25 MED ORDER — ALBUTEROL SULFATE HFA 108 (90 BASE) MCG/ACT IN AERS
8.0000 | INHALATION_SPRAY | Freq: Once | RESPIRATORY_TRACT | Status: AC
  Administered 2019-08-25: 06:00:00 8 via RESPIRATORY_TRACT

## 2019-08-25 MED ORDER — ALBUTEROL (5 MG/ML) CONTINUOUS INHALATION SOLN
10.0000 mg/h | INHALATION_SOLUTION | Freq: Once | RESPIRATORY_TRACT | Status: AC
  Administered 2019-08-25: 10 mg/h via RESPIRATORY_TRACT
  Filled 2019-08-25: qty 20

## 2019-08-25 MED ORDER — IPRATROPIUM BROMIDE 0.02 % IN SOLN
0.5000 mg | Freq: Once | RESPIRATORY_TRACT | Status: AC
  Administered 2019-08-25: 0.5 mg via RESPIRATORY_TRACT
  Filled 2019-08-25: qty 2.5

## 2019-08-25 MED ORDER — PREDNISONE 50 MG PO TABS: ORAL_TABLET | ORAL | 0 refills | Status: DC

## 2019-08-25 MED ORDER — ALBUTEROL SULFATE HFA 108 (90 BASE) MCG/ACT IN AERS
INHALATION_SPRAY | RESPIRATORY_TRACT | Status: AC
  Administered 2019-08-25: 06:00:00 8 via RESPIRATORY_TRACT
  Filled 2019-08-25: qty 6.7

## 2019-08-25 NOTE — ED Notes (Signed)
xr in room  

## 2019-08-25 NOTE — Discharge Instructions (Addendum)
Your testing is reassuring For some reason you continue to have flare ups that come on suddenly Make sure that you take the prednisone daily for 5 days Use the Albuterol every 4 hours today, then as needed every 4 hours starting tomorrow Doxycycline 2 times daily for possible infection ER for worsening symptoms Dr. Luan Pulling in next couple of days for recheck

## 2019-08-25 NOTE — ED Triage Notes (Signed)
Pt c/o SOB since yesterday, pt on 4L O2 at home

## 2019-08-25 NOTE — ED Provider Notes (Signed)
I have personally examined the patient, she is now speaking in full sentences, feels like she is back to her baseline.  She appears similar to when I saw her recently.  She looks well, oxygen of 100%, it is not clear why she gets these flareups, I suspect she has some type of allergic reaction which sets her off.  She will again be treated with a short course of prednisone, she has all of her inhalers at home.   Noemi Chapel, MD 08/25/19 [phone]

## 2019-08-25 NOTE — ED Provider Notes (Signed)
[facility] EMERGENCY DEPARTMENT Provider Note   CSN: [id number] Arrival date & time: 08/25/19  [id number]     History   Chief Complaint Chief Complaint  Patient presents with  . Shortness of Breath    HPI Katelyn [name] is a 78 y.o. female.     Level 5 caveat for respiratory distress.  Patient with severe COPD on home oxygen presenting with difficulty breathing with cough and congestion since last night.  She was recently seen in the ED on August 23 for the same and several visits in July as well.  She states she has been on her usual oxygen and has not had to increase it.  She is using multiple nebulizer at home without much improvement.  She has some chest tightness and pain with coughing which is been nonproductive.  She states she has not smoked in years.  She denies any leg pain or leg swelling.  Denies any fevers, chills, nausea or vomiting.  She is severely short of breath and speaking 1-2 word answers.  No known coronavirus exposures.   The history is provided by the patient. The history is limited by the condition of the patient.  Shortness of Breath Associated symptoms: chest pain and cough   Associated symptoms: no abdominal pain, no fever and no vomiting     Past Medical History:  Diagnosis Date  . Asthma   . Chronic neck pain   . Chronic pain of left elbow   . Complication of anesthesia    hard to wake  . COPD (chronic obstructive pulmonary disease) ([place])   . Dysphagia    requiring multiple dilations in past  . Headache    Patient states she needs  eyeglasses  . Irregular heart beat   . S/P colonoscopy 2009   dr. Gala Romney: anal papilla, 2 cecal AVMs. Due in 2014  . S/P endoscopy 2012   Dr. Gala Romney: mild chronic gastritis, bulbar erosions, s/p 54-French Maloney dilation   . S/P endoscopy 2003, 2004, 2007   gastritis, duodenitis, s/p dilation, +H.pylori     Patient Active Problem List   Diagnosis Date Noted  . Acute on chronic respiratory failure with hypoxia ([place])  03/17/2019  . IDA (iron deficiency anemia) 07/04/2018  . Acute metabolic encephalopathy [id number]  . Acute respiratory failure with hypoxia and hypercapnia ([place]) 07/02/2018  . GERD (gastroesophageal reflux disease) 06/13/2018  . Malnutrition ([place]) 06/13/2018  . Essential hypertension 06/13/2018  . Tachycardia 04/14/2018  . Acute gastritis 03/31/2018  . Esophagitis 03/31/2018  . Duodenal ulcer 03/31/2018  . Goals of care, counseling/discussion   . DNR (do not resuscitate) discussion   . Palliative care by specialist   . COPD with acute exacerbation ([place]) 03/29/2018  . COPD (chronic obstructive pulmonary disease) ([place]) 02/03/2018  . Hypoxia 11/28/2015  . COPD exacerbation ([place]) 11/28/2015  . Acute respiratory failure with hypoxia ([place]) 11/28/2015  . SOB (shortness of breath) 11/28/2015  . Mucosal abnormality of stomach   . History of colonic polyps 04/25/2015  . 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 Dysphagia 03/11/2011    Past Surgical History:  Procedure Laterality Date  . APPENDECTOMY    . BIOPSY  03/30/2018   Procedure: BIOPSY;  Surgeon: Daneil Dolin, MD;  Location: [facility] END[facility];  Service: Gastroenterology;;  gastric  . CHOLECYSTECTOMY    . COLONOSCOPY  10/10/2008   RMR: 1. Anal papilla, otherwise normal rectum. 2. Two cecal arteriovenous malformations, colonic mucosa apperaed normal.   . COLONOSCOPY N/A 05/10/2015  RMR: Rectal and colonic polyps removed as described above. Ascending colon AVMs . Rectal polyps were tubular adenomas. Next colonoscopy May 2021.  . ESOPHAGEAL DILATION N/A 05/10/2015   Procedure: ESOPHAGEAL DILATION;  Surgeon: Daneil Dolin, MD;  Location: [facility];  Service: Endoscopy;  Laterality: N/A;  . ESOPHAGOGASTRODUODENOSCOPY  04/02/2011   RMR: 1. Endoscopically normal -appearing esophagus status post passage of a Maloney dilator followed by biopsy. 2. Hiatal hernia, Mottling, and submucosal petechiae in teh gastric mucoas of uncertain significance  status post biopsy. 3. Bulbar erosions as described above.   . ESOPHAGOGASTRODUODENOSCOPY N/A 05/10/2015   RMR: Normal esophagus status post Maloney dilation. Small hiatal hernia. Abnormal gastric because of doubtful clinical significance status post biopsy (benign)  . ESOPHAGOGASTRODUODENOSCOPY (EGD) WITH PROPOFOL N/A 03/30/2018   Procedure: ESOPHAGOGASTRODUODENOSCOPY (EGD) WITH PROPOFOL;  Surgeon: Daneil Dolin, MD;  Location: [facility];  Service: Gastroenterology;  Laterality: N/A;  WITH DILATION   . INGUINAL HERNIA REPAIR    . MALONEY DILATION  03/30/2018   Procedure: Venia Minks DILATION;  Surgeon: Daneil Dolin, MD;  Location: [facility];  Service: Gastroenterology;;  . TONSILLECTOMY    . TOTAL ABDOMINAL HYSTERECTOMY       OB History    Gravida  5   Para  5   Term  4   Preterm  1   AB      Living  4     SAB      TAB      Ectopic      Multiple      Live Births               Home Medications    Prior to Admission medications   Medication Sig Start Date End Date Taking? Authorizing Provider  albuterol (PROVENTIL HFA;VENTOLIN HFA) 108 (90 BASE) MCG/ACT inhaler Inhale 2 puffs into the lungs every 6 (six) hours as needed for wheezing or shortness of breath. 09/04/15   [name], MD  albuterol (PROVENTIL) (2.5 MG/3ML) 0.083% nebulizer solution Take 2.5 mg by nebulization every 4 (four) hours as needed for shortness of breath.    [provider]  ferrous sulfate 325 (65 FE) MG tablet Take 1 tablet (325 mg total) by mouth daily with breakfast. 03/19/19   Sinda Du, MD  Multiple Vitamins-Minerals (MULTIVITAMIN ADULT) CHEW Chew 2 each by mouth daily. Vitafusion gummy    [provider]  OXYGEN Inhale 4 L into the lungs daily.    [provider]  predniSONE (DELTASONE) 20 MG tablet Take 2 tablets (40 mg total) by mouth daily. 08/15/19   Noemi Chapel, MD  roflumilast (DALIRESP) 500 MCG TABS tablet Take 0.5 tablets (250 mcg total) by  mouth daily. 07/05/18   Sinda Du, MD    Family History Family History  Problem Relation Age of Onset  . Heart disease Mother   . Lung cancer Father   . Cancer Sister   . Diabetes Brother   . Colon cancer Neg Hx     Social History Social History   Tobacco Use  . Smoking status: Former Smoker    Packs/day: 1.00    Years: 57.00    Pack years: 57.00    Types: Cigarettes    Start date: 08/18/1959    Quit date: 08/14/2016    Years since quitting: 3.0  . Smokeless tobacco: Never Used  Substance Use Topics  . Alcohol use: No    Alcohol/week: 0.0 standard drinks  . Drug use:  No     Allergies   Sevoflurane   Review of Systems Review of Systems  Constitutional: Negative for fever.  Respiratory: Positive for cough, chest tightness and shortness of breath.   Cardiovascular: Positive for chest pain.  Gastrointestinal: Negative for abdominal pain, nausea and vomiting.  Genitourinary: Negative for dysuria and hematuria.  Musculoskeletal: Negative for arthralgias, back pain and myalgias.  Skin: Negative for wound.  Neurological: Negative for weakness.    all other systems are negative except as noted in the HPI and PMH.    Physical Exam Updated Vital Signs BP (!) 149/93 (BP Location: Left Arm)   Pulse 85   Temp 98.5 F (36.9 C) (Oral)   Resp (!) 30   Ht 5\' 4"  (1.626 m)   Wt 42.6 kg   SpO2 99%   BMI 16.14 kg/m   Physical Exam Vitals signs and nursing note reviewed.  Constitutional:      General: She is in acute distress.     Appearance: She is well-developed. She is ill-appearing.     Comments: Severe shortness of breath with speaking 1-2 word phrases, tachypnea  HENT:     Head: Normocephalic and atraumatic.     Mouth/Throat:     Pharynx: No oropharyngeal exudate.  Eyes:     Conjunctiva/sclera: Conjunctivae normal.     Pupils: Pupils are equal, round, and reactive to light.  Neck:     Musculoskeletal: Normal range of motion and neck supple.      Comments: No meningismus. Cardiovascular:     Rate and Rhythm: Normal rate and regular rhythm.     Heart sounds: Normal heart sounds. No murmur.  Pulmonary:     Effort: Respiratory distress present.     Breath sounds: Wheezing present.     Comments: Very diminished breath sounds bilaterally with faint expiratory wheezing, tachypnea to the 30s with intercostal retractions Abdominal:     Palpations: Abdomen is soft.     Tenderness: There is no abdominal tenderness. There is no guarding or rebound.  Musculoskeletal: Normal range of motion.        General: No tenderness.  Skin:    General: Skin is warm.     Capillary Refill: Capillary refill takes less than 2 seconds.  Neurological:     General: No focal deficit present.     Mental Status: She is alert and oriented to person, place, and time. Mental status is at baseline.     Cranial Nerves: No cranial nerve deficit.     Motor: No abnormal muscle tone.     Coordination: Coordination normal.     Comments:  5/5 strength throughout. CN 2-12 intact.Equal grip strength.   Psychiatric:        Behavior: Behavior normal.      ED Treatments / Results  Labs (all labs ordered are listed, but only abnormal results are displayed) Labs Reviewed  CBC WITH DIFFERENTIAL/PLATELET - Abnormal; Notable for the following components:      Result Value   Eosinophils Absolute 0.7 (*)    All other components within normal limits  BASIC METABOLIC PANEL - Abnormal; Notable for the following components:   Glucose, Bld 111 (*)    Calcium 8.7 (*)    All other components within normal limits  SARS CORONAVIRUS 2 (HOSPITAL ORDER, [facility])  TROPONIN I (HIGH SENSITIVITY)    EKG EKG Interpretation  Date/Time:  Wednesday August 25 2019 05:45:59 EDT Ventricular Rate:  84 PR Interval:  QRS Duration: 90 QT Interval:  371 QTC Calculation: 439 R Axis:   -17 Text Interpretation:  Sinus rhythm Borderline left axis deviation  Rate slower Artifact Confirmed by Ezequiel Essex [phone]) on 08/25/2019 5:57:43 AM   Radiology Dg Chest Portable 1 View  Result Date: 08/25/2019 CLINICAL DATA:  Shortness of breath, history of COPD, asthma, former smoker with 57 pack years EXAM: PORTABLE CHEST 1 VIEW COMPARISON:  Radiograph August 15, 2019, CT November 06, 2018 FINDINGS: Hyperinflation of the lungs with biapical lucency compatible with emphysema which is better assessed on prior cross-sectional imaging. No consolidation, features of edema, pneumothorax, or effusion. Pulmonary vascularity is normally distributed. The cardiomediastinal contours are unremarkable. No acute osseous or soft tissue abnormality. 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 Electronically Signed   By: Lovena Le M.D.   On: 08/25/2019 06:08    Procedures Procedures (including critical care time)  Medications Ordered in ED Medications  magnesium sulfate IVPB 2 g 50 mL (has no administration in time range)  albuterol (VENTOLIN HFA) 108 (90 Base) MCG/ACT inhaler 8 puff (has no administration in time range)  methylPREDNISolone sodium succinate (SOLU-MEDROL) 125 mg/2 mL injection 80 mg (has no administration in time range)     Initial Impression / Assessment and Plan / ED Course  I have reviewed the triage vital signs and the nursing notes.  Pertinent labs & imaging results that were available during my care of the patient were reviewed by me and considered in my medical decision making (see chart for details).       Severe COPD on home oxygen presenting with respiratory distress and coughing.  Recent ED visit for same.  Her EKG is nonischemic.  Patient given bronchodilators while her coronavirus test is pending.  She is also given IV steroids and IV magnesium.  X-ray negative for pneumothorax or pneumonia. Patient improving on reassessment after bronchodilators, steroids and magnesium.  She is able to speak in short sentences  and is moving more air.  She will be given a nebulizer once her coronavirus test has returned.  It appears the patient improves rather rapidly during her previous ED visits has not required admission since March.  She had ED visits in June, twice in July and once in August.  Anticipate patient will need nebulizer after her coronavirus test has returned.  She will need to be reassessed for her work of breathing and wheezing and have a trial of ambulation before consideration of discharge.  Care transferred to Dr. Sabra Heck at shift change.  Kortni Bodnar Quinteros was evaluated in Emergency Department on 08/25/2019 for the symptoms described in the history of present illness. She was evaluated in the context of the global COVID-19 pandemic, which necessitated consideration that the patient might be at risk for infection with the SARS-CoV-2 virus that causes COVID-19. Institutional protocols and algorithms that pertain to the evaluation of patients at risk for COVID-19 are in a state of rapid change based on information released by regulatory bodies including the CDC and federal and state organizations. These policies and algorithms were followed during the patient's care in the ED.  CRITICAL CARE Performed by: Ezequiel Essex Total critical care time: 35 minutes Critical care time was exclusive of separately billable procedures and treating other patients. Critical care was necessary to treat or prevent imminent or life-threatening deterioration. Critical care was time spent personally by me on the following activities: development of treatment plan with patient and/or surrogate as well as nursing, discussions with consultants, evaluation of  patient's response to treatment, examination of patient, obtaining history from patient or surrogate, ordering and performing treatments and interventions, ordering and review of laboratory studies, ordering and review of radiographic studies, pulse oximetry and re-evaluation  of patient's condition.   Final Clinical Impressions(s) / ED Diagnoses   Final diagnoses:  COPD exacerbation [facility])    ED Discharge Orders    None       Voncille Simm, Annie Main, MD 08/25/19 0700

## 2019-08-25 NOTE — ED Notes (Signed)
Dr. Sabra Heck gave verbal order to discontinue second troponin lab draw.

## 2019-09-06 ENCOUNTER — Other Ambulatory Visit: Payer: Self-pay

## 2019-09-06 NOTE — Patient Outreach (Signed)
[facility]) Care Management  09/06/2019  ANSHU STAAL 02-Aug-1941 [id number]   Successful outreach with Ms. Riopelle. She was evaluated in the ED on 08/25/19 due to COPD exacerbation.   She reports taking medications as prescribed and following treatment recommendations regarding nebulizer use. Reports taking frequent breaks during the day and using 02 at 4L/min when needed. She did not attend the scheduled appointment with Dr. Luan Pulling on 08/17/19. Reports rescheduling the appointment for 09/21/19.  She denies urgent concerns. Agreeable to follow-up later this month. Current Outpatient Medications on File Prior to Visit  Medication Sig Dispense Refill  . albuterol (PROVENTIL HFA;VENTOLIN HFA) 108 (90 BASE) MCG/ACT inhaler Inhale 2 puffs into the lungs every 6 (six) hours as needed for wheezing or shortness of breath. 1 Inhaler 1  . albuterol (PROVENTIL) (2.5 MG/3ML) 0.083% nebulizer solution Take 2.5 mg by nebulization every 4 (four) hours as needed for shortness of breath.    . doxycycline (VIBRAMYCIN) 100 MG capsule Take 1 capsule (100 mg total) by mouth 2 (two) times daily. 20 capsule 0  . ferrous sulfate 325 (65 FE) MG tablet Take 1 tablet (325 mg total) by mouth daily with breakfast. 30 tablet 3  . Multiple Vitamins-Minerals (MULTIVITAMIN ADULT) CHEW Chew 2 each by mouth daily. Vitafusion gummy    . OXYGEN Inhale 4 L into the lungs daily.    . predniSONE (DELTASONE) 50 MG tablet 1 tablet PO daily 5 tablet 0  . roflumilast (DALIRESP) 500 MCG TABS tablet Take 0.5 tablets (250 mcg total) by mouth daily. 30 tablet 12   No current facility-administered medications on file prior to visit.     PLAN -Will follow-up this month.   [facility] [phone]

## 2019-09-08 ENCOUNTER — Other Ambulatory Visit: Payer: Self-pay

## 2019-09-08 ENCOUNTER — Emergency Department (HOSPITAL_COMMUNITY)
Admission: EM | Admit: 2019-09-08 | Discharge: 2019-09-08 | Disposition: A | Discharge status: Home / Self Care | Payer: Medicare Other | Attending: Emergency Medicine | Admitting: Emergency Medicine

## 2019-09-08 ENCOUNTER — Emergency Department (HOSPITAL_COMMUNITY): Payer: Medicare Other

## 2019-09-08 ENCOUNTER — Encounter (HOSPITAL_COMMUNITY): Payer: Self-pay | Admitting: Emergency Medicine

## 2019-09-08 DIAGNOSIS — Z79899 Other long term (current) drug therapy: Secondary | ICD-10-CM | POA: Insufficient documentation

## 2019-09-08 DIAGNOSIS — Z20828 Contact with and (suspected) exposure to other viral communicable diseases: Secondary | ICD-10-CM | POA: Insufficient documentation

## 2019-09-08 DIAGNOSIS — R000 Tachycardia, unspecified: Secondary | ICD-10-CM | POA: Diagnosis not present

## 2019-09-08 DIAGNOSIS — Z87891 Personal history of nicotine dependence: Secondary | ICD-10-CM | POA: Insufficient documentation

## 2019-09-08 DIAGNOSIS — R0602 Shortness of breath: Secondary | ICD-10-CM | POA: Diagnosis not present

## 2019-09-08 DIAGNOSIS — J441 Chronic obstructive pulmonary disease with (acute) exacerbation: Secondary | ICD-10-CM | POA: Diagnosis not present

## 2019-09-08 LAB — SARS CORONAVIRUS 2 BY RT PCR (HOSPITAL ORDER, PERFORMED IN ~~LOC~~ HOSPITAL LAB): SARS Coronavirus 2: NEGATIVE

## 2019-09-08 LAB — CBC WITH DIFFERENTIAL/PLATELET
Abs Immature Granulocytes: 0.02 10*3/uL (ref 0.00–0.07)
Basophils Absolute: 0 10*3/uL (ref 0.0–0.1)
Basophils Relative: 0 %
Eosinophils Absolute: 0.5 10*3/uL (ref 0.0–0.5)
Eosinophils Relative: 5 %
HCT: 38.1 % (ref 36.0–46.0)
Hemoglobin: 11.8 g/dL — ABNORMAL LOW (ref 12.0–15.0)
Immature Granulocytes: 0 %
Lymphocytes Relative: 24 %
Lymphs Abs: 2.2 10*3/uL (ref 0.7–4.0)
MCH: 29.4 pg (ref 26.0–34.0)
MCHC: 31 g/dL (ref 30.0–36.0)
MCV: 95 fL (ref 80.0–100.0)
Monocytes Absolute: 1.1 10*3/uL — ABNORMAL HIGH (ref 0.1–1.0)
Monocytes Relative: 12 %
Neutro Abs: 5.3 10*3/uL (ref 1.7–7.7)
Neutrophils Relative %: 59 %
Platelets: 281 10*3/uL (ref 150–400)
RBC: 4.01 MIL/uL (ref 3.87–5.11)
RDW: 14 % (ref 11.5–15.5)
WBC: 9 10*3/uL (ref 4.0–10.5)
nRBC: 0 % (ref 0.0–0.2)

## 2019-09-08 LAB — BASIC METABOLIC PANEL
Anion gap: 11 (ref 5–15)
BUN: 17 mg/dL (ref 8–23)
CO2: 24 mmol/L (ref 22–32)
Calcium: 9.2 mg/dL (ref 8.9–10.3)
Chloride: 105 mmol/L (ref 98–111)
Creatinine, Ser: 0.89 mg/dL (ref 0.44–1.00)
GFR calc Af Amer: >60 mL/min (ref >60)
GFR calc non Af Amer: >60 mL/min (ref >60)
Glucose, Bld: 121 mg/dL — ABNORMAL HIGH (ref 70–99)
Potassium: 4.2 mmol/L (ref 3.5–5.1)
Sodium: 140 mmol/L (ref 135–145)

## 2019-09-08 MED ORDER — IPRATROPIUM BROMIDE 0.02 % IN SOLN
0.5000 mg | Freq: Once | RESPIRATORY_TRACT | Status: AC
  Administered 2019-09-08: 04:00:00 0.5 mg via RESPIRATORY_TRACT
  Filled 2019-09-08: qty 2.5

## 2019-09-08 MED ORDER — ALBUTEROL SULFATE HFA 108 (90 BASE) MCG/ACT IN AERS: 2.0000 | INHALATION_SPRAY | Freq: Four times a day (QID) | RESPIRATORY_TRACT | 0 refills | Status: AC | PRN

## 2019-09-08 MED ORDER — METHYLPREDNISOLONE SODIUM SUCC 125 MG IJ SOLR
80.0000 mg | Freq: Once | INTRAMUSCULAR | Status: AC
  Administered 2019-09-08: 03:00:00 80 mg via INTRAVENOUS
  Filled 2019-09-08: qty 2

## 2019-09-08 MED ORDER — ALBUTEROL (5 MG/ML) CONTINUOUS INHALATION SOLN
10.0000 mg/h | INHALATION_SOLUTION | Freq: Once | RESPIRATORY_TRACT | Status: AC
  Administered 2019-09-08: 10 mg/h via RESPIRATORY_TRACT
  Filled 2019-09-08: qty 20

## 2019-09-08 MED ORDER — DOXYCYCLINE HYCLATE 100 MG PO CAPS: 100.0000 mg | ORAL_CAPSULE | Freq: Two times a day (BID) | ORAL | 0 refills | Status: DC

## 2019-09-08 MED ORDER — PREDNISONE 50 MG PO TABS: ORAL_TABLET | ORAL | 0 refills | Status: DC

## 2019-09-08 MED ORDER — ALBUTEROL SULFATE HFA 108 (90 BASE) MCG/ACT IN AERS
8.0000 | INHALATION_SPRAY | Freq: Once | RESPIRATORY_TRACT | Status: AC
  Administered 2019-09-08: 03:00:00 8 via RESPIRATORY_TRACT

## 2019-09-08 MED ORDER — MAGNESIUM SULFATE 2 GM/50ML IV SOLN
2.0000 g | Freq: Once | INTRAVENOUS | Status: AC
  Administered 2019-09-08: 2 g via INTRAVENOUS
  Filled 2019-09-08: qty 50

## 2019-09-08 MED ORDER — ALBUTEROL SULFATE HFA 108 (90 BASE) MCG/ACT IN AERS
INHALATION_SPRAY | RESPIRATORY_TRACT | Status: AC
  Administered 2019-09-08: 8 via RESPIRATORY_TRACT
  Filled 2019-09-08: qty 6.7

## 2019-09-08 NOTE — ED Notes (Signed)
Son on the way to pick up patient

## 2019-09-08 NOTE — ED Provider Notes (Signed)
[facility] Provider Note   CSN: [id number] Arrival date & time: 09/08/19  0246     History   Chief Complaint Chief Complaint  Patient presents with  . Shortness of Breath    HPI Katelyn [name] is a 78 y.o. female.     Level 5 caveat for respiratory distress.  Patient with history of COPD on home oxygen presenting with increased difficulty breathing and coughing since yesterday.  She is had recent several ED visits for the same.  Last seen by myself on September 2.  States she is been in her usual oxygen and not had to increase it.  She has been using her nebulizer at home without relief.  Her cough is nonproductive without runny nose or sore throat.  No fever.  No leg pain or leg swelling.  She is very short of breath and speaking short 1-2 word answers.  She denies chest pain or leg pain or leg swelling.  No known coronavirus exposures.  States no longer smokes.  She was discharged home from her previous visit on a course of prednisone which she completed.  The history is provided by the patient. The history is limited by the condition of the patient.  Shortness of Breath   Past Medical History:  Diagnosis Date  . Asthma   . Chronic neck pain   . Chronic pain of left elbow   . Complication of anesthesia    hard to wake  . COPD (chronic obstructive pulmonary disease) ([place])   . Dysphagia    requiring multiple dilations in past  . Headache    Patient states she needs  eyeglasses  . Irregular heart beat   . S/P colonoscopy 2009   dr. Gala Romney: anal papilla, 2 cecal AVMs. Due in 2014  . S/P endoscopy 2012   Dr. Gala Romney: mild chronic gastritis, bulbar erosions, s/p 54-French Maloney dilation   . S/P endoscopy 2003, 2004, 2007   gastritis, duodenitis, s/p dilation, +H.pylori     Patient Active Problem List   Diagnosis Date Noted  . Acute on chronic respiratory failure with hypoxia ([place]) 03/17/2019  . IDA (iron deficiency anemia) 07/04/2018  . Acute  metabolic encephalopathy [id number]  . Acute respiratory failure with hypoxia and hypercapnia ([place]) 07/02/2018  . GERD (gastroesophageal reflux disease) 06/13/2018  . Malnutrition ([place]) 06/13/2018  . Essential hypertension 06/13/2018  . Tachycardia 04/14/2018  . Acute gastritis 03/31/2018  . Esophagitis 03/31/2018  . Duodenal ulcer 03/31/2018  . Goals of care, counseling/discussion   . DNR (do not resuscitate) discussion   . Palliative care by specialist   . COPD with acute exacerbation ([place]) 03/29/2018  . COPD (chronic obstructive pulmonary disease) ([place]) 02/03/2018  . Hypoxia 11/28/2015  . COPD exacerbation ([place]) 11/28/2015  . Acute respiratory failure with hypoxia ([place]) 11/28/2015  . SOB (shortness of breath) 11/28/2015  . Mucosal abnormality of stomach   . History of colonic polyps 04/25/2015  . 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 Dysphagia 03/11/2011    Past Surgical History:  Procedure Laterality Date  . APPENDECTOMY    . BIOPSY  03/30/2018   Procedure: BIOPSY;  Surgeon: Daneil Dolin, MD;  [facility];  Service: Gastroenterology;;  gastric  . CHOLECYSTECTOMY    . COLONOSCOPY  10/10/2008   RMR: 1. Anal papilla, otherwise normal rectum. 2. Two cecal arteriovenous malformations, colonic mucosa apperaed normal.   . COLONOSCOPY N/A 05/10/2015   RMR: Rectal and colonic polyps removed as described above. Ascending colon  AVMs . Rectal polyps were tubular adenomas. Next colonoscopy May 2021.  . ESOPHAGEAL DILATION N/A 05/10/2015   Procedure: ESOPHAGEAL DILATION;  Surgeon: Daneil Dolin, MD;  Location: [facility];  Service: Endoscopy;  Laterality: N/A;  . ESOPHAGOGASTRODUODENOSCOPY  04/02/2011   RMR: 1. Endoscopically normal -appearing esophagus status post passage of a Maloney dilator followed by biopsy. 2. Hiatal hernia, Mottling, and submucosal petechiae in teh gastric mucoas of uncertain significance status post biopsy. 3. Bulbar erosions as described above.   .  ESOPHAGOGASTRODUODENOSCOPY N/A 05/10/2015   RMR: Normal esophagus status post Maloney dilation. Small hiatal hernia. Abnormal gastric because of doubtful clinical significance status post biopsy (benign)  . ESOPHAGOGASTRODUODENOSCOPY (EGD) WITH PROPOFOL N/A 03/30/2018   Procedure: ESOPHAGOGASTRODUODENOSCOPY (EGD) WITH PROPOFOL;  Surgeon: Daneil Dolin, MD;  Location[facility];  Service: Gastroenterology;  Laterality: N/A;  WITH DILATION   . INGUINAL HERNIA REPAIR    . MALONEY DILATION  03/30/2018   Procedure: Venia Minks DILATION;  Surgeon: Daneil Dolin, MD;  Location: [facility];  Service: Gastroenterology;;  . TONSILLECTOMY    . TOTAL ABDOMINAL HYSTERECTOMY       OB History    Gravida  5   Para  5   Term  4   Preterm  1   AB      Living  4     SAB      TAB      Ectopic      Multiple      Live Births               Home Medications    Prior to Admission medications   Medication Sig Start Date End Date Taking? Authorizing Provider  albuterol (PROVENTIL HFA;VENTOLIN HFA) 108 (90 BASE) MCG/ACT inhaler Inhale 2 puffs into the lungs every 6 (six) hours as needed for wheezing or shortness of breath. 09/04/15   [name], MD  albuterol (PROVENTIL) (2.5 MG/3ML) 0.083% nebulizer solution Take 2.5 mg by nebulization every 4 (four) hours as needed for shortness of breath.    [provider]  doxycycline (VIBRAMYCIN) 100 MG capsule Take 1 capsule (100 mg total) by mouth 2 (two) times daily. 08/25/19   Joshuajames Moehring, Annie Main, MD  ferrous sulfate 325 (65 FE) MG tablet Take 1 tablet (325 mg total) by mouth daily with breakfast. 03/19/19   Sinda Du, MD  Multiple Vitamins-Minerals (MULTIVITAMIN ADULT) CHEW Chew 2 each by mouth daily. Vitafusion gummy    [provider]  OXYGEN Inhale 4 L into the lungs daily.    [provider]  predniSONE (DELTASONE) 50 MG tablet 1 tablet PO daily 08/25/19   Tabor Denham, Annie Main, MD  roflumilast (DALIRESP) 500 MCG TABS  tablet Take 0.5 tablets (250 mcg total) by mouth daily. 07/05/18   Sinda Du, MD    Family History Family History  Problem Relation Age of Onset  . Heart disease Mother   . Lung cancer Father   . Cancer Sister   . Diabetes Brother   . Colon cancer Neg Hx     Social History Social History   Tobacco Use  . Smoking status: Former Smoker    Packs/day: 1.00    Years: 57.00    Pack years: 57.00    Types: Cigarettes    Start date: 08/18/1959    Quit date: 08/14/2016    Years since quitting: 3.0  . Smokeless tobacco: Never Used  Substance Use Topics  . Alcohol use: No  Alcohol/week: 0.0 standard drinks  . Drug use: No     Allergies   Sevoflurane   Review of Systems Review of Systems  Unable to perform ROS: Severe respiratory distress  Respiratory: Positive for shortness of breath.      Physical Exam Updated Vital Signs BP (!) 162/91   Pulse (!) 107   Resp (!) 24   SpO2 96%   Physical Exam Vitals signs and nursing note reviewed.  Constitutional:      General: She is not in acute distress.    Appearance: She is well-developed. She is ill-appearing.     Comments: Tachypnea, respiratory distress, speaking short phrases  HENT:     Head: Normocephalic and atraumatic.     Mouth/Throat:     Pharynx: No oropharyngeal exudate.  Eyes:     Conjunctiva/sclera: Conjunctivae normal.     Pupils: Pupils are equal, round, and reactive to light.  Neck:     Musculoskeletal: Normal range of motion and neck supple.     Comments: No meningismus. Cardiovascular:     Rate and Rhythm: Normal rate and regular rhythm.     Heart sounds: Normal heart sounds. No murmur.  Pulmonary:     Effort: Respiratory distress present.     Breath sounds: Wheezing present.     Comments: Diminished air exchange bilaterally with scattered expiratory wheezing, tachypnea to the 30s with accessory muscle use. Abdominal:     Palpations: Abdomen is soft.     Tenderness: There is no abdominal  tenderness. There is no guarding or rebound.  Musculoskeletal: Normal range of motion.        General: No tenderness.     Right lower leg: No edema.     Left lower leg: No edema.  Skin:    General: Skin is warm.     Capillary Refill: Capillary refill takes less than 2 seconds.  Neurological:     General: No focal deficit present.     Mental Status: She is alert and oriented to person, place, and time. Mental status is at baseline.     Cranial Nerves: No cranial nerve deficit.     Motor: No abnormal muscle tone.     Coordination: Coordination normal.     Comments:  5/5 strength throughout. CN 2-12 intact.Equal grip strength.   Psychiatric:        Behavior: Behavior normal.      ED Treatments / Results  Labs (all labs ordered are listed, but only abnormal results are displayed) Labs Reviewed  CBC WITH DIFFERENTIAL/PLATELET - Abnormal; Notable for the following components:      Result Value   Hemoglobin 11.8 (*)    Monocytes Absolute 1.1 (*)    All other components within normal limits  BASIC METABOLIC PANEL - Abnormal; Notable for the following components:   Glucose, Bld 121 (*)    All other components within normal limits  SARS CORONAVIRUS 2 (HOSPITAL ORDER, [facility])    EKG EKG Interpretation  Date/Time:  Wednesday September 08 2019 02:58:02 EDT Ventricular Rate:  102 PR Interval:    QRS Duration: 96 QT Interval:  342 QTC Calculation: 446 R Axis:   -40 Text Interpretation:  Sinus tachycardia Multiform ventricular premature complexes Probable left atrial enlargement Left axis deviation Low voltage, precordial leads RSR' in V1 or V2, probably normal variant Artifact No significant change was found Confirmed by Ezequiel Essex [phone]) on 09/08/2019 3:10:54 AM   Radiology Dg Chest Portable 1 View  Result Date: 09/08/2019 CLINICAL DATA:  Shortness of breath for 2 days EXAM: PORTABLE CHEST 1 VIEW COMPARISON:  08/25/2019 FINDINGS: Cardiac  shadow is stable. Aortic calcifications are noted. The lungs are hyper aerated bilaterally. No focal infiltrate or sizable effusion is seen. IMPRESSION: COPD without acute abnormality. Electronically Signed   By: Inez Catalina M.D.   On: 09/08/2019 03:53    Procedures Procedures (including critical care time)  Medications Ordered in ED Medications  magnesium sulfate IVPB 2 g 50 mL (2 g Intravenous New Bag/Given 09/08/19 0257)  albuterol (VENTOLIN HFA) 108 (90 Base) MCG/ACT inhaler 8 puff (8 puffs Inhalation Given 09/08/19 0252)  methylPREDNISolone sodium succinate (SOLU-MEDROL) 125 mg/2 mL injection 80 mg (80 mg Intravenous Given 09/08/19 0257)     Initial Impression / Assessment and Plan / ED Course  I have reviewed the triage vital signs and the nursing notes.  Pertinent labs & imaging results that were available during my care of the patient were reviewed by me and considered in my medical decision making (see chart for details).        Patient with suspected COPD exacerbation here with increased respiratory distress, tachypnea and coughing.  He is given bronchodilators while her coronavirus test is pending.  She is given IV steroids and IV magnesium.  Her EKG is nonischemic.  Her breathing is improving with bronchodilators, steroids and magnesium.  Chest x-ray is negative for pneumothorax or pneumonia.  Patient moving better air.  Coronavirus testing is negative.  She is given a continuous nebulizer. States compliance with her medications.  She is not certain why she keeps on having these exacerbations of her breathing that rapidly improved. It appears the patient improves rather rapidly during her previous ED visits has not required admission since March.  She had ED visits in June, twice in July and once in August and once earlier in September.  Patient given continuous nebulizer.  She feels improved after this and is moving air well and in no respiratory distress.  She is speaking  in full sentences and feels like she is breathing back to baseline.  She is able to ambulate on her home oxygen without desaturation. She appears similar to when I saw her on September 2.  Proximal level is 100% on 2 L.  She will begin be given a course of steroids, prophylactic antibiotics, and instructed to use her nebulizer machine every 4 hours today.  States she has her inhalers at home.  Follow-up with PCP.  Return precautions discussed.  CRITICAL CARE Performed by: Ezequiel Essex Total critical care time: 32 minutes Critical care time was exclusive of separately billable procedures and treating other patients. Critical care was necessary to treat or prevent imminent or life-threatening deterioration. Critical care was time spent personally by me on the following activities: development of treatment plan with patient and/or surrogate as well as nursing, discussions with consultants, evaluation of patient's response to treatment, examination of patient, obtaining history from patient or surrogate, ordering and performing treatments and interventions, ordering and review of laboratory studies, ordering and review of radiographic studies, pulse oximetry and re-evaluation of patient's condition.     Ninive Obara Arakelian was evaluated in Emergency Department on 09/08/2019 for the symptoms described in the history of present illness. She was evaluated in the context of the global COVID-19 pandemic, which necessitated consideration that the patient might be at risk for infection with the SARS-CoV-2 virus that causes COVID-19. Institutional protocols and algorithms that pertain to the evaluation of  patients at risk for COVID-19 are in a state of rapid change based on information released by regulatory bodies including the CDC and federal and state organizations. These policies and algorithms were followed during the patient's care in the ED.  Final Clinical Impressions(s) / ED Diagnoses   Final  diagnoses:  COPD exacerbation [facility])    ED Discharge Orders    None       Jaccob Czaplicki, Annie Main, MD 09/08/19 [phone]

## 2019-09-08 NOTE — Discharge Instructions (Addendum)
Take the antibiotics and steroids as prescribed. Use the nebulizer every 4hours today and then every 4 hours as needed. Followup with Dr. Luan Pulling. Return to the ED if you develop chest pain, shortness of breath, or any other concerns.

## 2019-09-08 NOTE — ED Notes (Signed)
Pt. Maintained O2 saturation of 98-100% while ambulating around the emergency department one time.

## 2019-09-08 NOTE — ED Triage Notes (Signed)
Pt c/o increased sob that started today. 

## 2019-09-17 ENCOUNTER — Emergency Department (HOSPITAL_COMMUNITY): Payer: Medicare Other | Admitting: Anesthesiology

## 2019-09-17 ENCOUNTER — Inpatient Hospital Stay (HOSPITAL_COMMUNITY)
Admission: EM | Admit: 2019-09-17 | Discharge: 2019-09-21 | DRG: 330 | Disposition: A | Discharge status: Home / Self Care | Payer: Medicare Other | Attending: General Surgery | Admitting: General Surgery

## 2019-09-17 ENCOUNTER — Other Ambulatory Visit: Payer: Self-pay

## 2019-09-17 ENCOUNTER — Encounter (HOSPITAL_COMMUNITY): Payer: Self-pay | Admitting: *Deleted

## 2019-09-17 ENCOUNTER — Encounter (HOSPITAL_COMMUNITY)
Admission: EM | Disposition: A | Discharge status: Home / Self Care | Payer: Self-pay | Source: Home / Self Care | Attending: General Surgery

## 2019-09-17 ENCOUNTER — Emergency Department (HOSPITAL_COMMUNITY): Payer: Medicare Other

## 2019-09-17 DIAGNOSIS — Z9049 Acquired absence of other specified parts of digestive tract: Secondary | ICD-10-CM | POA: Diagnosis not present

## 2019-09-17 DIAGNOSIS — Z7952 Long term (current) use of systemic steroids: Secondary | ICD-10-CM | POA: Diagnosis not present

## 2019-09-17 DIAGNOSIS — R1030 Lower abdominal pain, unspecified: Secondary | ICD-10-CM

## 2019-09-17 DIAGNOSIS — N736 Female pelvic peritoneal adhesions (postinfective): Secondary | ICD-10-CM | POA: Diagnosis present

## 2019-09-17 DIAGNOSIS — K56609 Unspecified intestinal obstruction, unspecified as to partial versus complete obstruction: Secondary | ICD-10-CM | POA: Diagnosis not present

## 2019-09-17 DIAGNOSIS — Z884 Allergy status to anesthetic agent status: Secondary | ICD-10-CM | POA: Diagnosis not present

## 2019-09-17 DIAGNOSIS — R188 Other ascites: Secondary | ICD-10-CM | POA: Diagnosis present

## 2019-09-17 DIAGNOSIS — K565 Intestinal adhesions [bands], unspecified as to partial versus complete obstruction: Secondary | ICD-10-CM | POA: Diagnosis not present

## 2019-09-17 DIAGNOSIS — Z87891 Personal history of nicotine dependence: Secondary | ICD-10-CM | POA: Diagnosis not present

## 2019-09-17 DIAGNOSIS — K559 Vascular disorder of intestine, unspecified: Secondary | ICD-10-CM | POA: Diagnosis present

## 2019-09-17 DIAGNOSIS — K460 Unspecified abdominal hernia with obstruction, without gangrene: Principal | ICD-10-CM | POA: Diagnosis present

## 2019-09-17 DIAGNOSIS — Z79899 Other long term (current) drug therapy: Secondary | ICD-10-CM | POA: Diagnosis not present

## 2019-09-17 DIAGNOSIS — I10 Essential (primary) hypertension: Secondary | ICD-10-CM | POA: Diagnosis not present

## 2019-09-17 DIAGNOSIS — Z791 Long term (current) use of non-steroidal anti-inflammatories (NSAID): Secondary | ICD-10-CM | POA: Diagnosis not present

## 2019-09-17 DIAGNOSIS — R109 Unspecified abdominal pain: Secondary | ICD-10-CM | POA: Diagnosis not present

## 2019-09-17 DIAGNOSIS — Z23 Encounter for immunization: Secondary | ICD-10-CM | POA: Diagnosis not present

## 2019-09-17 DIAGNOSIS — Z9981 Dependence on supplemental oxygen: Secondary | ICD-10-CM | POA: Diagnosis not present

## 2019-09-17 DIAGNOSIS — M542 Cervicalgia: Secondary | ICD-10-CM | POA: Diagnosis present

## 2019-09-17 DIAGNOSIS — Z90710 Acquired absence of both cervix and uterus: Secondary | ICD-10-CM

## 2019-09-17 DIAGNOSIS — Z20828 Contact with and (suspected) exposure to other viral communicable diseases: Secondary | ICD-10-CM | POA: Diagnosis present

## 2019-09-17 DIAGNOSIS — R1110 Vomiting, unspecified: Secondary | ICD-10-CM

## 2019-09-17 DIAGNOSIS — M25522 Pain in left elbow: Secondary | ICD-10-CM | POA: Diagnosis present

## 2019-09-17 DIAGNOSIS — J449 Chronic obstructive pulmonary disease, unspecified: Secondary | ICD-10-CM | POA: Diagnosis present

## 2019-09-17 DIAGNOSIS — G8929 Other chronic pain: Secondary | ICD-10-CM | POA: Diagnosis present

## 2019-09-17 DIAGNOSIS — Z03818 Encounter for observation for suspected exposure to other biological agents ruled out: Secondary | ICD-10-CM | POA: Diagnosis not present

## 2019-09-17 HISTORY — PX: BOWEL RESECTION: SHX1257

## 2019-09-17 LAB — DIFFERENTIAL
Abs Immature Granulocytes: 0.08 10*3/uL — ABNORMAL HIGH (ref 0.00–0.07)
Basophils Absolute: 0.1 10*3/uL (ref 0.0–0.1)
Basophils Relative: 1 %
Eosinophils Absolute: 0.2 10*3/uL (ref 0.0–0.5)
Eosinophils Relative: 2 %
Immature Granulocytes: 1 %
Lymphocytes Relative: 8 %
Lymphs Abs: 1.2 10*3/uL (ref 0.7–4.0)
Monocytes Absolute: 0.9 10*3/uL (ref 0.1–1.0)
Monocytes Relative: 7 %
Neutro Abs: 11.4 10*3/uL — ABNORMAL HIGH (ref 1.7–7.7)
Neutrophils Relative %: 81 %

## 2019-09-17 LAB — CBC
HCT: 39.6 % (ref 36.0–46.0)
Hemoglobin: 12.2 g/dL (ref 12.0–15.0)
MCH: 29.1 pg (ref 26.0–34.0)
MCHC: 30.8 g/dL (ref 30.0–36.0)
MCV: 94.5 fL (ref 80.0–100.0)
Platelets: 385 10*3/uL (ref 150–400)
RBC: 4.19 MIL/uL (ref 3.87–5.11)
RDW: 14.3 % (ref 11.5–15.5)
WBC: 13.9 10*3/uL — ABNORMAL HIGH (ref 4.0–10.5)
nRBC: 0 % (ref 0.0–0.2)

## 2019-09-17 LAB — COMPREHENSIVE METABOLIC PANEL
ALT: 20 U/L (ref 0–44)
AST: 28 U/L (ref 15–41)
Albumin: 4.2 g/dL (ref 3.5–5.0)
Alkaline Phosphatase: 77 U/L (ref 38–126)
Anion gap: 10 (ref 5–15)
BUN: 23 mg/dL (ref 8–23)
CO2: 27 mmol/L (ref 22–32)
Calcium: 9.3 mg/dL (ref 8.9–10.3)
Chloride: 103 mmol/L (ref 98–111)
Creatinine, Ser: 1 mg/dL (ref 0.44–1.00)
GFR calc Af Amer: >60 mL/min (ref >60)
GFR calc non Af Amer: 55 mL/min — ABNORMAL LOW (ref >60)
Glucose, Bld: 162 mg/dL — ABNORMAL HIGH (ref 70–99)
Potassium: 4.3 mmol/L (ref 3.5–5.1)
Sodium: 140 mmol/L (ref 135–145)
Total Bilirubin: 0.6 mg/dL (ref 0.3–1.2)
Total Protein: 6.8 g/dL (ref 6.5–8.1)

## 2019-09-17 LAB — URINALYSIS, ROUTINE W REFLEX MICROSCOPIC
Bilirubin Urine: NEGATIVE
Glucose, UA: NEGATIVE mg/dL
Hgb urine dipstick: NEGATIVE
Ketones, ur: 5 mg/dL — AB
Leukocytes,Ua: NEGATIVE
Nitrite: NEGATIVE
Protein, ur: NEGATIVE mg/dL
Specific Gravity, Urine: 1.028 (ref 1.005–1.030)
pH: 7 (ref 5.0–8.0)

## 2019-09-17 LAB — TYPE AND SCREEN
ABO/RH(D): O POS
Antibody Screen: NEGATIVE

## 2019-09-17 LAB — LIPASE, BLOOD: Lipase: 25 U/L (ref 11–51)

## 2019-09-17 LAB — LACTIC ACID, PLASMA: Lactic Acid, Venous: 1.1 mmol/L (ref 0.5–1.9)

## 2019-09-17 LAB — SARS CORONAVIRUS 2 BY RT PCR (HOSPITAL ORDER, PERFORMED IN ~~LOC~~ HOSPITAL LAB): SARS Coronavirus 2: NEGATIVE

## 2019-09-17 SURGERY — EXCISION, SMALL INTESTINE
Anesthesia: General | Site: Abdomen

## 2019-09-17 MED ORDER — LACTATED RINGERS IV SOLN: INTRAVENOUS | Status: DC

## 2019-09-17 MED ORDER — SODIUM CHLORIDE 0.9 % IV SOLN
INTRAVENOUS | Status: DC
  Administered 2019-09-17: 12:00:00 via INTRAVENOUS

## 2019-09-17 MED ORDER — 0.9 % SODIUM CHLORIDE (POUR BTL) OPTIME
TOPICAL | Status: DC | PRN
  Administered 2019-09-17: 2000 mL

## 2019-09-17 MED ORDER — FENTANYL CITRATE (PF) 100 MCG/2ML IJ SOLN
INTRAMUSCULAR | Status: DC | PRN
  Administered 2019-09-17 (×2): 50 ug via INTRAVENOUS
  Administered 2019-09-17 (×2): 25 ug via INTRAVENOUS
  Administered 2019-09-17: 50 ug via INTRAVENOUS

## 2019-09-17 MED ORDER — LIDOCAINE 2% (20 MG/ML) 5 ML SYRINGE
INTRAMUSCULAR | Status: AC
  Filled 2019-09-17: qty 5

## 2019-09-17 MED ORDER — PROMETHAZINE HCL 25 MG/ML IJ SOLN: 6.2500 mg | INTRAMUSCULAR | Status: DC | PRN

## 2019-09-17 MED ORDER — PANTOPRAZOLE SODIUM 40 MG IV SOLR
40.0000 mg | Freq: Every day | INTRAVENOUS | Status: DC
  Administered 2019-09-17 – 2019-09-21 (×4): 40 mg via INTRAVENOUS
  Filled 2019-09-17 (×4): qty 40

## 2019-09-17 MED ORDER — SUGAMMADEX SODIUM 200 MG/2ML IV SOLN
INTRAVENOUS | Status: DC | PRN
  Administered 2019-09-17: 100 mg via INTRAVENOUS

## 2019-09-17 MED ORDER — SIMETHICONE 80 MG PO CHEW: 40.0000 mg | CHEWABLE_TABLET | Freq: Four times a day (QID) | ORAL | Status: DC | PRN

## 2019-09-17 MED ORDER — METOPROLOL TARTRATE 5 MG/5ML IV SOLN: 5.0000 mg | Freq: Four times a day (QID) | INTRAVENOUS | Status: DC | PRN

## 2019-09-17 MED ORDER — FENTANYL CITRATE (PF) 100 MCG/2ML IJ SOLN
25.0000 ug | Freq: Once | INTRAMUSCULAR | Status: AC
  Administered 2019-09-17: 25 ug via INTRAVENOUS
  Filled 2019-09-17: qty 2

## 2019-09-17 MED ORDER — ONDANSETRON 4 MG PO TBDP
4.0000 mg | ORAL_TABLET | Freq: Once | ORAL | Status: AC | PRN
  Administered 2019-09-17: 4 mg via ORAL
  Filled 2019-09-17: qty 1

## 2019-09-17 MED ORDER — ALBUTEROL SULFATE (2.5 MG/3ML) 0.083% IN NEBU
INHALATION_SOLUTION | RESPIRATORY_TRACT | Status: AC
  Filled 2019-09-17: qty 3

## 2019-09-17 MED ORDER — MORPHINE SULFATE (PF) 2 MG/ML IV SOLN: 2.0000 mg | INTRAVENOUS | Status: DC | PRN

## 2019-09-17 MED ORDER — BUPIVACAINE LIPOSOME 1.3 % IJ SUSP
INTRAMUSCULAR | Status: AC
  Filled 2019-09-17: qty 20

## 2019-09-17 MED ORDER — CHLORHEXIDINE GLUCONATE CLOTH 2 % EX PADS: 6.0000 | MEDICATED_PAD | Freq: Once | CUTANEOUS | Status: DC

## 2019-09-17 MED ORDER — PREDNISONE 20 MG PO TABS: 50.0000 mg | ORAL_TABLET | Freq: Every day | ORAL | Status: DC

## 2019-09-17 MED ORDER — LACTATED RINGERS IV SOLN
INTRAVENOUS | Status: DC | PRN
  Administered 2019-09-17 (×2): via INTRAVENOUS

## 2019-09-17 MED ORDER — ALBUTEROL SULFATE (2.5 MG/3ML) 0.083% IN NEBU
2.5000 mg | INHALATION_SOLUTION | RESPIRATORY_TRACT | Status: DC
  Administered 2019-09-17: 2.5 mg via RESPIRATORY_TRACT
  Filled 2019-09-17: qty 3

## 2019-09-17 MED ORDER — BUPIVACAINE LIPOSOME 1.3 % IJ SUSP
INTRAMUSCULAR | Status: DC | PRN
  Administered 2019-09-17: 20 mL

## 2019-09-17 MED ORDER — ONDANSETRON HCL 4 MG/2ML IJ SOLN: 4.0000 mg | Freq: Four times a day (QID) | INTRAMUSCULAR | Status: DC | PRN

## 2019-09-17 MED ORDER — SUCCINYLCHOLINE 20MG/ML (10ML) SYRINGE FOR MEDFUSION PUMP - OPTIME
INTRAMUSCULAR | Status: DC | PRN
  Administered 2019-09-17: 80 mg via INTRAVENOUS

## 2019-09-17 MED ORDER — CHLORHEXIDINE GLUCONATE CLOTH 2 % EX PADS
6.0000 | MEDICATED_PAD | Freq: Every morning | CUTANEOUS | Status: DC
  Administered 2019-09-18 – 2019-09-21 (×4): 6 via TOPICAL

## 2019-09-17 MED ORDER — MIDAZOLAM HCL 2 MG/2ML IJ SOLN: 0.5000 mg | Freq: Once | INTRAMUSCULAR | Status: DC | PRN

## 2019-09-17 MED ORDER — ONDANSETRON 4 MG PO TBDP: 4.0000 mg | ORAL_TABLET | Freq: Four times a day (QID) | ORAL | Status: DC | PRN

## 2019-09-17 MED ORDER — PHENYLEPHRINE HCL (PRESSORS) 10 MG/ML IV SOLN
INTRAVENOUS | Status: DC | PRN
  Administered 2019-09-17: 100 ug via INTRAVENOUS

## 2019-09-17 MED ORDER — PROPOFOL 10 MG/ML IV BOLUS
INTRAVENOUS | Status: AC
  Filled 2019-09-17: qty 20

## 2019-09-17 MED ORDER — SODIUM CHLORIDE 0.9 % IV BOLUS
500.0000 mL | Freq: Once | INTRAVENOUS | Status: AC
  Administered 2019-09-17: 500 mL via INTRAVENOUS

## 2019-09-17 MED ORDER — ONDANSETRON HCL 4 MG/2ML IJ SOLN
4.0000 mg | Freq: Once | INTRAMUSCULAR | Status: AC
  Administered 2019-09-17: 4 mg via INTRAVENOUS
  Filled 2019-09-17: qty 2

## 2019-09-17 MED ORDER — DIPHENHYDRAMINE HCL 12.5 MG/5ML PO ELIX: 12.5000 mg | ORAL_SOLUTION | Freq: Four times a day (QID) | ORAL | Status: DC | PRN

## 2019-09-17 MED ORDER — ONDANSETRON HCL 4 MG/2ML IJ SOLN
INTRAMUSCULAR | Status: DC | PRN
  Administered 2019-09-17: 4 mg via INTRAVENOUS

## 2019-09-17 MED ORDER — CHLORHEXIDINE GLUCONATE 0.12 % MT SOLN
15.0000 mL | Freq: Two times a day (BID) | OROMUCOSAL | Status: DC
  Administered 2019-09-17 – 2019-09-21 (×8): 15 mL via OROMUCOSAL
  Filled 2019-09-17 (×9): qty 15

## 2019-09-17 MED ORDER — LIDOCAINE HCL (CARDIAC) PF 50 MG/5ML IV SOSY
PREFILLED_SYRINGE | INTRAVENOUS | Status: DC | PRN
  Administered 2019-09-17: 40 mg via INTRAVENOUS

## 2019-09-17 MED ORDER — KETOROLAC TROMETHAMINE 15 MG/ML IJ SOLN
15.0000 mg | Freq: Four times a day (QID) | INTRAMUSCULAR | Status: DC | PRN
  Administered 2019-09-18: 15 mg via INTRAVENOUS
  Filled 2019-09-17: qty 1

## 2019-09-17 MED ORDER — ORAL CARE MOUTH RINSE
15.0000 mL | Freq: Two times a day (BID) | OROMUCOSAL | Status: DC
  Administered 2019-09-18 – 2019-09-21 (×7): 15 mL via OROMUCOSAL

## 2019-09-17 MED ORDER — SUCCINYLCHOLINE CHLORIDE 200 MG/10ML IV SOSY
PREFILLED_SYRINGE | INTRAVENOUS | Status: AC
  Filled 2019-09-17: qty 10

## 2019-09-17 MED ORDER — ALBUTEROL SULFATE (2.5 MG/3ML) 0.083% IN NEBU
3.0000 mL | INHALATION_SOLUTION | Freq: Four times a day (QID) | RESPIRATORY_TRACT | Status: DC | PRN
  Administered 2019-09-20 – 2019-09-21 (×2): 3 mL via RESPIRATORY_TRACT
  Filled 2019-09-17 (×2): qty 3

## 2019-09-17 MED ORDER — IOHEXOL 300 MG/ML  SOLN
75.0000 mL | Freq: Once | INTRAMUSCULAR | Status: AC | PRN
  Administered 2019-09-17: 75 mL via INTRAVENOUS

## 2019-09-17 MED ORDER — LACTATED RINGERS IV SOLN
INTRAVENOUS | Status: DC
  Administered 2019-09-18 – 2019-09-21 (×5): via INTRAVENOUS

## 2019-09-17 MED ORDER — ROCURONIUM BROMIDE 10 MG/ML (PF) SYRINGE
PREFILLED_SYRINGE | INTRAVENOUS | Status: AC
  Filled 2019-09-17: qty 10

## 2019-09-17 MED ORDER — ALBUTEROL SULFATE (2.5 MG/3ML) 0.083% IN NEBU
2.5000 mg | INHALATION_SOLUTION | Freq: Four times a day (QID) | RESPIRATORY_TRACT | Status: DC | PRN
  Administered 2019-09-17: 2.5 mg via RESPIRATORY_TRACT

## 2019-09-17 MED ORDER — FENTANYL CITRATE (PF) 250 MCG/5ML IJ SOLN
INTRAMUSCULAR | Status: AC
  Filled 2019-09-17: qty 5

## 2019-09-17 MED ORDER — HEPARIN SODIUM (PORCINE) 5000 UNIT/ML IJ SOLN
5000.0000 [IU] | Freq: Three times a day (TID) | INTRAMUSCULAR | Status: DC
  Administered 2019-09-18 – 2019-09-20 (×7): 5000 [IU] via SUBCUTANEOUS
  Filled 2019-09-17 (×7): qty 1

## 2019-09-17 MED ORDER — SODIUM CHLORIDE 0.9 % IV SOLN
2.0000 g | INTRAVENOUS | Status: AC
  Administered 2019-09-17: 2 g via INTRAVENOUS
  Filled 2019-09-17: qty 2

## 2019-09-17 MED ORDER — BACITRACIN-NEOMYCIN-POLYMYXIN 400-5-5000 EX OINT
TOPICAL_OINTMENT | CUTANEOUS | Status: AC
  Filled 2019-09-17: qty 1

## 2019-09-17 MED ORDER — HYDROCODONE-ACETAMINOPHEN 7.5-325 MG PO TABS: 1.0000 | ORAL_TABLET | Freq: Once | ORAL | Status: DC | PRN

## 2019-09-17 MED ORDER — METOCLOPRAMIDE HCL 5 MG/ML IJ SOLN
10.0000 mg | Freq: Once | INTRAMUSCULAR | Status: AC
  Administered 2019-09-17: 10 mg via INTRAVENOUS
  Filled 2019-09-17: qty 2

## 2019-09-17 MED ORDER — INFLUENZA VAC A&B SA ADJ QUAD 0.5 ML IM PRSY
0.5000 mL | PREFILLED_SYRINGE | INTRAMUSCULAR | Status: AC
  Administered 2019-09-18: 0.5 mL via INTRAMUSCULAR
  Filled 2019-09-17: qty 0.5

## 2019-09-17 MED ORDER — PIPERACILLIN-TAZOBACTAM 3.375 G IVPB 30 MIN
3.3750 g | Freq: Once | INTRAVENOUS | Status: AC
  Administered 2019-09-17: 3.375 g via INTRAVENOUS
  Filled 2019-09-17: qty 50

## 2019-09-17 MED ORDER — ONDANSETRON HCL 4 MG/2ML IJ SOLN
INTRAMUSCULAR | Status: AC
  Filled 2019-09-17: qty 2

## 2019-09-17 MED ORDER — DIPHENHYDRAMINE HCL 50 MG/ML IJ SOLN: 12.5000 mg | Freq: Four times a day (QID) | INTRAMUSCULAR | Status: DC | PRN

## 2019-09-17 MED ORDER — KETOROLAC TROMETHAMINE 15 MG/ML IJ SOLN
15.0000 mg | Freq: Four times a day (QID) | INTRAMUSCULAR | Status: AC
  Administered 2019-09-18: 15 mg via INTRAVENOUS
  Filled 2019-09-17: qty 1

## 2019-09-17 MED ORDER — ROCURONIUM 10MG/ML (10ML) SYRINGE FOR MEDFUSION PUMP - OPTIME
INTRAVENOUS | Status: DC | PRN
  Administered 2019-09-17: 30 mg via INTRAVENOUS
  Administered 2019-09-17: 10 mg via INTRAVENOUS

## 2019-09-17 MED ORDER — PROPOFOL 10 MG/ML IV BOLUS
INTRAVENOUS | Status: DC | PRN
  Administered 2019-09-17: 75 mg via INTRAVENOUS

## 2019-09-17 MED ORDER — BENZOCAINE 20 % MT AERO
INHALATION_SPRAY | Freq: Once | OROMUCOSAL | Status: AC
  Administered 2019-09-17: 15:00:00 via OROMUCOSAL
  Filled 2019-09-17: qty 57

## 2019-09-17 MED ORDER — BACITRACIN-NEOMYCIN-POLYMYXIN 400-5-5000 EX OINT
TOPICAL_OINTMENT | CUTANEOUS | Status: DC | PRN
  Administered 2019-09-17: 1 via TOPICAL

## 2019-09-17 MED ORDER — HYDROMORPHONE HCL 1 MG/ML IJ SOLN: 0.2500 mg | INTRAMUSCULAR | Status: DC | PRN

## 2019-09-17 MED ORDER — SODIUM CHLORIDE 0.9 % IV SOLN
INTRAVENOUS | Status: AC
  Filled 2019-09-17: qty 2

## 2019-09-17 SURGICAL SUPPLY — 66 items
APPLIER CLIP 11 MED OPEN (CLIP)
APPLIER CLIP 13 LRG OPEN (CLIP)
BARRIER SKIN 2 3/4 (OSTOMY) IMPLANT
BARRIER SKIN 2 3/4 INCH (OSTOMY)
BARRIER SKIN OD2.25 2 3/4 FLNG (OSTOMY) IMPLANT
CELLS DAT CNTRL 66122 CELL SVR (MISCELLANEOUS) ×2 IMPLANT
CHLORAPREP W/TINT 26 (MISCELLANEOUS) ×4 IMPLANT
CLAMP POUCH DRAINAGE QUIET (OSTOMY) IMPLANT
CLIP APPLIE 11 MED OPEN (CLIP) IMPLANT
CLIP APPLIE 13 LRG OPEN (CLIP) IMPLANT
CLOTH BEACON ORANGE TIMEOUT ST (SAFETY) ×4 IMPLANT
COVER LIGHT HANDLE STERIS (MISCELLANEOUS) ×8 IMPLANT
DRAPE WARM FLUID 44X44 (DRAPES) ×4 IMPLANT
DRSG OPSITE POSTOP 4X10 (GAUZE/BANDAGES/DRESSINGS) IMPLANT
DRSG OPSITE POSTOP 4X8 (GAUZE/BANDAGES/DRESSINGS) ×2 IMPLANT
ELECT BLADE 6 FLAT ULTRCLN (ELECTRODE) IMPLANT
ELECT REM PT RETURN 9FT ADLT (ELECTROSURGICAL) ×4
ELECTRODE REM PT RTRN 9FT ADLT (ELECTROSURGICAL) ×2 IMPLANT
GLOVE BIO SURGEON STRL SZ 6.5 (GLOVE) ×4 IMPLANT
GLOVE BIO SURGEONS STRL SZ 6.5 (GLOVE) ×2
GLOVE BIOGEL PI IND STRL 6.5 (GLOVE) ×2 IMPLANT
GLOVE BIOGEL PI IND STRL 7.0 (GLOVE) ×4 IMPLANT
GLOVE BIOGEL PI INDICATOR 6.5 (GLOVE) ×4
GLOVE BIOGEL PI INDICATOR 7.0 (GLOVE) ×6
GOWN STRL REUS W/TWL LRG LVL3 (GOWN DISPOSABLE) ×12 IMPLANT
HANDLE SUCTION POOLE (INSTRUMENTS) ×2 IMPLANT
INST SET MAJOR GENERAL (KITS) ×4 IMPLANT
KIT REMOVER STAPLE SKIN (MISCELLANEOUS) IMPLANT
MANIFOLD NEPTUNE II (INSTRUMENTS) ×4 IMPLANT
NDL HYPO 18GX1.5 BLUNT FILL (NEEDLE) ×2 IMPLANT
NDL HYPO 21X1.5 SAFETY (NEEDLE) ×2 IMPLANT
NEEDLE HYPO 18GX1.5 BLUNT FILL (NEEDLE) ×4 IMPLANT
NEEDLE HYPO 21X1.5 SAFETY (NEEDLE) ×4 IMPLANT
NS IRRIG 1000ML POUR BTL (IV SOLUTION) ×8 IMPLANT
PACK ABDOMINAL MAJOR (CUSTOM PROCEDURE TRAY) ×4 IMPLANT
PAD ARMBOARD 7.5X6 YLW CONV (MISCELLANEOUS) ×4 IMPLANT
POUCH OSTOMY 2 3/4  H 3804 (WOUND CARE)
POUCH OSTOMY 2 PC DRNBL 2.75 (WOUND CARE) IMPLANT
RELOAD LINEAR CUT PROX 55 BLUE (ENDOMECHANICALS) ×8 IMPLANT
RELOAD PROXIMATE 75MM BLUE (ENDOMECHANICALS) IMPLANT
RELOAD STAPLE 55 3.8 BLU REG (ENDOMECHANICALS) IMPLANT
RELOAD STAPLE 75 3.8 BLU REG (ENDOMECHANICALS) IMPLANT
RETRACTOR WND ALEXIS 18 MED (MISCELLANEOUS) ×2 IMPLANT
RETRACTOR WND ALEXIS 25 LRG (MISCELLANEOUS) IMPLANT
RTRCTR WOUND ALEXIS 18CM MED (MISCELLANEOUS) ×4
RTRCTR WOUND ALEXIS 25CM LRG (MISCELLANEOUS)
SET BASIN LINEN APH (SET/KITS/TRAYS/PACK) ×4 IMPLANT
SPONGE LAP 18X18 RF (DISPOSABLE) ×6 IMPLANT
STAPLER GUN LINEAR PROX 60 (STAPLE) ×2 IMPLANT
STAPLER PROXIMATE 55 BLUE (STAPLE) ×2 IMPLANT
STAPLER PROXIMATE 75MM BLUE (STAPLE) IMPLANT
STAPLER VISISTAT (STAPLE) ×4 IMPLANT
SUCTION POOLE HANDLE (INSTRUMENTS) ×4
SUT CHROMIC 0 SH (SUTURE) IMPLANT
SUT CHROMIC 2 0 SH (SUTURE) IMPLANT
SUT CHROMIC 3 0 SH 27 (SUTURE) ×2 IMPLANT
SUT PDS AB CT VIOLET #0 27IN (SUTURE) ×8 IMPLANT
SUT PROLENE 2 0 SH 30 (SUTURE) ×4 IMPLANT
SUT SILK 2 0 (SUTURE) ×2
SUT SILK 2-0 18XBRD TIE 12 (SUTURE) ×2 IMPLANT
SUT SILK 3 0 SH CR/8 (SUTURE) ×4 IMPLANT
SUT VIC AB 3-0 SH 27 (SUTURE) ×2
SUT VIC AB 3-0 SH 27X BRD (SUTURE) IMPLANT
SYR 20CC LL (SYRINGE) ×8 IMPLANT
TRAY FOLEY W/BAG SLVR 16FR (SET/KITS/TRAYS/PACK) ×2
TRAY FOLEY W/BAG SLVR 16FR ST (SET/KITS/TRAYS/PACK) ×2 IMPLANT

## 2019-09-17 NOTE — Anesthesia Procedure Notes (Signed)
Procedure Name: Intubation Date/Time: 09/17/2019 3:45 PM Performed by: Ollen Bowl, CRNA Pre-anesthesia Checklist: Patient identified, Patient being monitored, Timeout performed, Emergency Drugs available and Suction available Patient Re-evaluated:Patient Re-evaluated prior to induction Oxygen Delivery Method: Circle system utilized Preoxygenation: Pre-oxygenation with 100% oxygen Induction Type: IV induction Ventilation: Mask ventilation without difficulty Laryngoscope Size: Mac and 3 Grade View: Grade II Tube type: Oral Tube size: 7.0 mm Number of attempts: 1 Airway Equipment and Method: Stylet Placement Confirmation: ETT inserted through vocal cords under direct vision,  positive ETCO2 and breath sounds checked- equal and bilateral Secured at: 21 cm Tube secured with: Tape Dental Injury: Teeth and Oropharynx as per pre-operative assessment  Comments: ETT placed by  Dr. Hilaria Ota

## 2019-09-17 NOTE — ED Notes (Signed)
Katelyn [name], per patient. Pt gave verbal permission to add Jeneen Rinks to emergency contact list to receive info.

## 2019-09-17 NOTE — Transfer of Care (Signed)
Immediate Anesthesia Transfer of Care Note  Patient: Katelyn [name]  Procedure(s) Performed: small bowel resection (N/A Abdomen)  Patient Location: PACU  Anesthesia Type:General  Level of Consciousness: awake and responds to stimulation  Airway & Oxygen Therapy: Patient connected to face mask oxygen  Post-op Assessment: Report given to RN and Patient moving all extremities  Post vital signs: Reviewed and stable  Last Vitals:  Vitals Value Taken Time  BP 164/81 09/17/19 1746  Temp 36.7 C 09/17/19 1746  Pulse 109 09/17/19 1751  Resp 23 09/17/19 1751  SpO2 100 % 09/17/19 1751  Vitals shown include unvalidated device data.  Last Pain:  Vitals:   09/17/19 1532  TempSrc: Oral  PainSc: 0-No pain         Complications: No apparent anesthesia complications.  Patient responsive with purposeful movements, but not following specific commands.  Albuteral treatments given upon arrival to post op.  ICU bed requested.

## 2019-09-17 NOTE — ED Notes (Signed)
Patient continues to c/o abd pain.  Vomited X 1 in the waiting room prior to being brought to treatment room.

## 2019-09-17 NOTE — ED Triage Notes (Signed)
Pt c/o abdominal pain, vomiting x 1 that started this morning.

## 2019-09-17 NOTE — ED Provider Notes (Signed)
[facility] Provider Note   CSN: [id number] Arrival date & time: 09/17/19  [id number]     History   Chief Complaint Chief Complaint  Patient presents with  . Abdominal Pain    HPI Katelyn [name] is a 78 y.o. female.     Patient with history of COPD, respiratory failure, high blood pressure, cholecystectomy, appendectomy, hysterectomy presents with worsening abdominal pain since yesterday evening.  Patient started feel nauseous and now vomiting.  No sick contacts.  Mild blood in the urine yesterday.  No fevers.  No history of bowel obstruction.  Pain diffuse fairly constant.     Past Medical History:  Diagnosis Date  . Asthma   . Chronic neck pain   . Chronic pain of left elbow   . Complication of anesthesia    hard to wake  . COPD (chronic obstructive pulmonary disease) ([place])   . Dysphagia    requiring multiple dilations in past  . Headache    Patient states she needs  eyeglasses  . Irregular heart beat   . S/P colonoscopy 2009   dr. Gala Romney: anal papilla, 2 cecal AVMs. Due in 2014  . S/P endoscopy 2012   Dr. Gala Romney: mild chronic gastritis, bulbar erosions, s/p 54-French Maloney dilation   . S/P endoscopy 2003, 2004, 2007   gastritis, duodenitis, s/p dilation, +H.pylori     Patient Active Problem List   Diagnosis Date Noted  . Small bowel obstruction ([place])   . Acute on chronic respiratory failure with hypoxia ([place]) 03/17/2019  . IDA (iron deficiency anemia) 07/04/2018  . Acute metabolic encephalopathy [id number]  . Acute respiratory failure with hypoxia and hypercapnia ([place]) 07/02/2018  . GERD (gastroesophageal reflux disease) 06/13/2018  . Malnutrition ([place]) 06/13/2018  . Essential hypertension 06/13/2018  . Tachycardia 04/14/2018  . Acute gastritis 03/31/2018  . Esophagitis 03/31/2018  . Duodenal ulcer 03/31/2018  . Goals of care, counseling/discussion   . DNR (do not resuscitate) discussion   . Palliative care by specialist   . COPD with  acute exacerbation ([place]) 03/29/2018  . COPD (chronic obstructive pulmonary disease) ([place]) 02/03/2018  . Hypoxia 11/28/2015  . COPD exacerbation ([place]) 11/28/2015  . Acute respiratory failure with hypoxia ([place]) 11/28/2015  . SOB (shortness of breath) 11/28/2015  . Mucosal abnormality of stomach   . History of colonic polyps 04/25/2015  . 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 Dysphagia 03/11/2011    Past Surgical History:  Procedure Laterality Date  . APPENDECTOMY    . BIOPSY  03/30/2018   Procedure: BIOPSY;  Surgeon: Daneil Dolin, MD;  [facility];  Service: Gastroenterology;;  gastric  . CHOLECYSTECTOMY    . COLONOSCOPY  10/10/2008   RMR: 1. Anal papilla, otherwise normal rectum. 2. Two cecal arteriovenous malformations, colonic mucosa apperaed normal.   . COLONOSCOPY N/A 05/10/2015   RMR: Rectal and colonic polyps removed as described above. Ascending colon AVMs . Rectal polyps were tubular adenomas. Next colonoscopy May 2021.  . ESOPHAGEAL DILATION N/A 05/10/2015   Procedure: ESOPHAGEAL DILATION;  Surgeon: Daneil Dolin, MD;  Location: [facility];  Service: Endoscopy;  Laterality: N/A;  . ESOPHAGOGASTRODUODENOSCOPY  04/02/2011   RMR: 1. Endoscopically normal -appearing esophagus status post passage of a Maloney dilator followed by biopsy. 2. Hiatal hernia, Mottling, and submucosal petechiae in teh gastric mucoas of uncertain significance status post biopsy. 3. Bulbar erosions as described above.   . ESOPHAGOGASTRODUODENOSCOPY N/A 05/10/2015   RMR: Normal esophagus status post Maloney dilation. Small  hiatal hernia. Abnormal gastric because of doubtful clinical significance status post biopsy (benign)  . ESOPHAGOGASTRODUODENOSCOPY (EGD) WITH PROPOFOL N/A 03/30/2018   Procedure: ESOPHAGOGASTRODUODENOSCOPY (EGD) WITH PROPOFOL;  Surgeon: Daneil Dolin, MD;  [facility];  Service: Gastroenterology;  Laterality: N/A;  WITH DILATION   . INGUINAL HERNIA REPAIR    . MALONEY  DILATION  03/30/2018   Procedure: Venia Minks DILATION;  Surgeon: Daneil Dolin, MD;  Location[facility];  Service: Gastroenterology;;  . TONSILLECTOMY    . TOTAL ABDOMINAL HYSTERECTOMY       OB History    Gravida  5   Para  5   Term  4   Preterm  1   AB      Living  4     SAB      TAB      Ectopic      Multiple      Live Births               Home Medications    Prior to Admission medications   Medication Sig Start Date End Date Taking? Authorizing Provider  albuterol (PROVENTIL) (2.5 MG/3ML) 0.083% nebulizer solution Take 2.5 mg by nebulization every 4 (four) hours as needed for shortness of breath.   Yes [provider]  albuterol (VENTOLIN HFA) 108 (90 Base) MCG/ACT inhaler Inhale 2 puffs into the lungs every 6 (six) hours as needed for wheezing or shortness of breath. 09/08/19  Yes Rancour, Annie Main, MD  doxycycline (VIBRAMYCIN) 100 MG capsule Take 1 capsule (100 mg total) by mouth 2 (two) times daily. 09/08/19  Yes Rancour, Annie Main, MD  ferrous sulfate 325 (65 FE) MG tablet Take 1 tablet (325 mg total) by mouth daily with breakfast. 03/19/19  Yes Sinda Du, MD  Multiple Vitamins-Minerals (MULTIVITAMIN ADULT) CHEW Chew 2 each by mouth daily. Vitafusion gummy   Yes [provider]  predniSONE (DELTASONE) 50 MG tablet 1 tablet PO daily 09/08/19  Yes Rancour, Annie Main, MD  roflumilast (DALIRESP) 500 MCG TABS tablet Take 0.5 tablets (250 mcg total) by mouth daily. 07/05/18  Yes Sinda Du, MD  OXYGEN Inhale 4 L into the lungs daily.    [provider]    Family History Family History  Problem Relation Age of Onset  . Heart disease Mother   . Lung cancer Father   . Cancer Sister   . Diabetes Brother   . Colon cancer Neg Hx     Social History Social History   Tobacco Use  . Smoking status: Former Smoker    Packs/day: 1.00    Years: 57.00    Pack years: 57.00    Types: Cigarettes    Start date: 08/18/1959    Quit date:  08/14/2016    Years since quitting: 3.0  . Smokeless tobacco: Never Used  Substance Use Topics  . Alcohol use: No    Alcohol/week: 0.0 standard drinks  . Drug use: No     Allergies   Sevoflurane   Review of Systems Review of Systems  Constitutional: Positive for appetite change. Negative for chills and fever.  HENT: Negative for congestion.   Eyes: Negative for visual disturbance.  Respiratory: Negative for shortness of breath.   Cardiovascular: Negative for chest pain.  Gastrointestinal: Positive for abdominal pain, nausea and vomiting.  Genitourinary: Negative for dysuria and flank pain.  Musculoskeletal: Negative for back pain, neck pain and neck stiffness.  Skin: Negative for rash.  Neurological: Negative for light-headedness and headaches.  Physical Exam Updated Vital Signs BP (!) 143/82   Pulse 92   Temp 98.7 F (37.1 C) (Oral)   Resp (!) 24   Ht 5\' 4"  (1.626 m)   Wt 49.9 kg   SpO2 96%   BMI 18.88 kg/m   Physical Exam Vitals signs and nursing note reviewed.  Constitutional:      Appearance: She is well-developed.  HENT:     Head: Normocephalic.     Comments: Dry mm Eyes:     General:        Right eye: No discharge.        Left eye: No discharge.     Conjunctiva/sclera: Conjunctivae normal.  Neck:     Musculoskeletal: Normal range of motion and neck supple.     Trachea: No tracheal deviation.  Cardiovascular:     Rate and Rhythm: Normal rate and regular rhythm.  Pulmonary:     Effort: Pulmonary effort is normal.     Breath sounds: Normal breath sounds.  Abdominal:     General: There is no distension.     Palpations: Abdomen is soft.     Tenderness: There is generalized abdominal tenderness. There is no guarding.  Skin:    General: Skin is warm.     Findings: No rash.  Neurological:     Mental Status: She is alert and oriented to person, place, and time.      ED Treatments / Results  Labs (all labs ordered are listed, but only  abnormal results are displayed) Labs Reviewed  COMPREHENSIVE METABOLIC PANEL - Abnormal; Notable for the following components:      Result Value   Glucose, Bld 162 (*)    GFR calc non Af Amer 55 (*)    All other components within normal limits  CBC - Abnormal; Notable for the following components:   WBC 13.9 (*)    All other components within normal limits  URINALYSIS, ROUTINE W REFLEX MICROSCOPIC - Abnormal; Notable for the following components:   Ketones, ur 5 (*)    All other components within normal limits  DIFFERENTIAL - Abnormal; Notable for the following components:   Neutro Abs 11.4 (*)    Abs Immature Granulocytes 0.08 (*)    All other components within normal limits  SARS CORONAVIRUS 2 (HOSPITAL ORDER, [facility])  LIPASE, BLOOD  LACTIC ACID, PLASMA  TYPE AND SCREEN    EKG EKG Interpretation  Date/Time:  Friday September 17 2019 07:57:41 EDT Ventricular Rate:  64 PR Interval:  168 QRS Duration: 86 QT Interval:  444 QTC Calculation: 458 R Axis:   66 Text Interpretation:  Sinus rhythm with sinus arrhythmia with occasional Premature ventricular complexes Otherwise normal ECG Confirmed by Elnora Morrison [phone]) on 09/17/2019 9:45:14 AM   Radiology Ct Abdomen Pelvis W Contrast  Result Date: 09/17/2019 CLINICAL DATA:  Abdominal pain and vomiting beginning this morning. EXAM: CT ABDOMEN AND PELVIS WITH CONTRAST TECHNIQUE: Multidetector CT imaging of the abdomen and pelvis was performed using the standard protocol following bolus administration of intravenous contrast. CONTRAST:  37mL OMNIPAQUE IOHEXOL 300 MG/ML  SOLN COMPARISON:  Chest CT-04/15/2018 FINDINGS: Lower chest: Limited visualization of the lower thorax is negative for focal airspace opacity or pleural effusion. Cardiomegaly.  No pericardial effusion. Hepatobiliary: Normal hepatic contour. No discrete hepatic lesions. Post cholecystectomy. No intra or extrahepatic biliary ductal  dilatation. No ascites. Pancreas: Normal appearance of the pancreas. No pancreatic duct dilatation. No ascites. Spleen: Appearance of the spleen.  Note is made of a small splenule about the splenic hilum. Note is made of a trace amount perisplenic fluid. Adrenals/Urinary Tract: There is symmetric enhancement and excretion of the bilateral kidneys. No definite renal stones on this postcontrast examination. Note is made of bilateral hypoattenuating renal lesions (right - image 20, series 7; left - image 15, series 7), both of which are too small to accurately characterize though favored to represent renal cysts. No urinary obstruction or perinephric stranding. Normal appearance the bilateral adrenal glands. Normal appearance of the urinary bladder given degree of distention. Stomach/Bowel: There is marked bowel wall thickening involving the terminal ileum with associated abrupt transition site (coronal image 31, series 5) with upstream distension of the small bowel and decompression of the downstream colon. There is no definitive swirling of the abdominal mesentery to suggest a volvulus or an internal hernia. No evidence of perforation or drainable fluid collection. No pneumoperitoneum, pneumatosis or portal venous gas. No additional areas of bowel wall thickening are identified. The appendix is not visualized compatible with provided operative history. Vascular/Lymphatic: Atherosclerotic plaque within a tortuous but normal caliber abdominal aorta. The major branch vessels of the abdominal aorta appear patent on this non CTA examination with special attention paid to the distal tributaries the SMA supplying the distal ileum. Note is made of a circumaortic left renal vein. No bulky retroperitoneal, mesenteric, pelvic or inguinal lymphadenopathy. Reproductive: Post hysterectomy. There is a small amount reactive fluid within the lower pelvis without peripheral wall enhancement to suggest definable/drainable abscess.  Other: Regional soft tissues appear normal. Musculoskeletal: No acute or aggressive osseous abnormalities. 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 Mild degenerative change the bilateral hips with joint space loss, subchondral sclerosis and osteophytosis. There is a prominent (approximately 1.1 cm) presumed loose body at the caudal medial aspect the right hip joint space though a discrete donor site is not identified. IMPRESSION: 1. Marked circumferential bowel wall thickening involving the distal ileum with associated small-bowel obstruction - nonspecific though could be seen in the setting of a localized enteritis potentially of infectious, inflammatory (such as Crohn's) or vascular etiology (though note the distal tributaries of the SMA appear patent on this non CTA examination). Clinical correlation is advised. 2. Small amount of presumably reactive free fluid within the abdomen and pelvis without evidence of perforation or definable/drainable fluid collection. 3.  Aortic Atherosclerosis (ICD10-I70.0). Critical Value/emergent results were called by telephone at the time of interpretation on 09/17/2019 at 11:30 am to [place] , who verbally acknowledged these results. Electronically Signed   By: Sandi Mariscal M.D.   On: 09/17/2019 11:35    Procedures .Critical Care Performed by: Elnora Morrison, MD Authorized by: Elnora Morrison, MD   Critical care provider statement:    Critical care time (minutes):  40   Critical care start time:  09/17/2019 11:00 AM   Critical care end time:  09/17/2019 11:40 AM   Critical care time was exclusive of:  Separately billable procedures and treating other patients and teaching time   Critical care was time spent personally by me on the following activities:  Evaluation of patient's response to treatment, examination of patient, ordering and performing treatments and interventions, ordering and review of  laboratory studies, ordering and review of radiographic studies, pulse oximetry, re-evaluation of patient's condition, obtaining history from patient or surrogate and review of old charts Comments:     Bowel obs   (including critical care time)  Medications  Ordered in ED Medications  0.9 %  sodium chloride infusion ( Intravenous New Bag/Given 09/17/19 1204)  Chlorhexidine Gluconate Cloth 2 % PADS 6 each (has no administration in time range)  cefoTEtan (CEFOTAN) 2 g in sodium chloride 0.9 % 100 mL IVPB (has no administration in time range)  0.9 % irrigation (POUR BTL) (2,000 mLs Irrigation Given 09/17/19 1610)  ondansetron (ZOFRAN-ODT) disintegrating tablet 4 mg (4 mg Oral Given 09/17/19 0802)  fentaNYL (SUBLIMAZE) injection 25 mcg (25 mcg Intravenous Given 09/17/19 0956)  metoCLOPramide (REGLAN) injection 10 mg (10 mg Intravenous Given 09/17/19 0956)  sodium chloride 0.9 % bolus 500 mL (0 mLs Intravenous Stopped 09/17/19 1131)  iohexol (OMNIPAQUE) 300 MG/ML solution 75 mL (75 mLs Intravenous Contrast Given 09/17/19 1042)  sodium chloride 0.9 % bolus 500 mL (500 mLs Intravenous New Bag/Given 09/17/19 1057)  ondansetron (ZOFRAN) injection 4 mg (4 mg Intravenous Given 09/17/19 1057)  piperacillin-tazobactam (ZOSYN) IVPB 3.375 g (3.375 g Intravenous New Bag/Given 09/17/19 1153)  fentaNYL (SUBLIMAZE) injection 25 mcg (25 mcg Intravenous Given 09/17/19 1439)  Benzocaine (HURRCAINE) 20 % mouth spray ( Mouth/Throat Given 09/17/19 1507)  propofol (DIPRIVAN) 10 mg/mL bolus/IV push (has no administration in time range)  fentaNYL (SUBLIMAZE) 250 MCG/5ML injection (has no administration in time range)  succinylcholine (ANECTINE) 200 MG/10ML syringe (has no administration in time range)  lidocaine 20 MG/ML injection (has no administration in time range)  rocuronium bromide 100 MG/10ML SOSY (has no administration in time range)  ondansetron (ZOFRAN) 4 MG/2ML injection (has no administration in time range)   bupivacaine liposome (EXPAREL) 1.3 % injection (has no administration in time range)  sodium chloride 0.9 % with cefoTEtan (CEFOTAN) ADS Med (has no administration in time range)     Initial Impression / Assessment and Plan / ED Course  I have reviewed the triage vital signs and the nursing notes.  Pertinent labs & imaging results that were available during my care of the patient were reviewed by me and considered in my medical decision making (see chart for details).       Patient presents with gradually worsening generalized lower abdominal pain concern for bowel obstruction versus colitis/diverticulitis versus urine infection versus other.  Plan for blood work with worsening symptoms and age plan for CT scan for further delineation.  Discussed with radiology concerns for significant inflammation and bowel obstruction.  Lactic acid added on.  IV fluids given.  No obvious clot seen by radiology.  NG tube ordered, surgery consult, IV antibiotics and hospitalist consulted. Lactic acid normal, urinalysis no acute infection, no fever in the ER. Discussed with surgeon who plans to take patient the operating room with significant CT findings for further delineation of pathology.  The patients results and plan were reviewed and discussed.   Any x-rays performed were independently reviewed by myself.   Differential diagnosis were considered with the presenting HPI.  Medications  0.9 %  sodium chloride infusion ( Intravenous New Bag/Given 09/17/19 1204)  Chlorhexidine Gluconate Cloth 2 % PADS 6 each (has no administration in time range)  cefoTEtan (CEFOTAN) 2 g in sodium chloride 0.9 % 100 mL IVPB (has no administration in time range)  0.9 % irrigation (POUR BTL) (2,000 mLs Irrigation Given 09/17/19 1610)  ondansetron (ZOFRAN-ODT) disintegrating tablet 4 mg (4 mg Oral Given 09/17/19 0802)  fentaNYL (SUBLIMAZE) injection 25 mcg (25 mcg Intravenous Given 09/17/19 0956)  metoCLOPramide (REGLAN)  injection 10 mg (10 mg Intravenous Given 09/17/19 0956)  sodium chloride 0.9 % bolus 500 mL (  0 mLs Intravenous Stopped 09/17/19 1131)  iohexol (OMNIPAQUE) 300 MG/ML solution 75 mL (75 mLs Intravenous Contrast Given 09/17/19 1042)  sodium chloride 0.9 % bolus 500 mL (500 mLs Intravenous New Bag/Given 09/17/19 1057)  ondansetron (ZOFRAN) injection 4 mg (4 mg Intravenous Given 09/17/19 1057)  piperacillin-tazobactam (ZOSYN) IVPB 3.375 g (3.375 g Intravenous New Bag/Given 09/17/19 1153)  fentaNYL (SUBLIMAZE) injection 25 mcg (25 mcg Intravenous Given 09/17/19 1439)  Benzocaine (HURRCAINE) 20 % mouth spray ( Mouth/Throat Given 09/17/19 1507)  propofol (DIPRIVAN) 10 mg/mL bolus/IV push (has no administration in time range)  fentaNYL (SUBLIMAZE) 250 MCG/5ML injection (has no administration in time range)  succinylcholine (ANECTINE) 200 MG/10ML syringe (has no administration in time range)  lidocaine 20 MG/ML injection (has no administration in time range)  rocuronium bromide 100 MG/10ML SOSY (has no administration in time range)  ondansetron (ZOFRAN) 4 MG/2ML injection (has no administration in time range)  bupivacaine liposome (EXPAREL) 1.3 % injection (has no administration in time range)  sodium chloride 0.9 % with cefoTEtan (CEFOTAN) ADS Med (has no administration in time range)    Vitals:   09/17/19 0738 09/17/19 0743 09/17/19 1532  BP: (!) 158/78  (!) 143/82  Pulse: 61  92  Resp: 20  (!) 24  Temp: (!) 97.5 F (36.4 C)  98.7 F (37.1 C)  TempSrc: Oral  Oral  SpO2: 98%  96%  Weight:  49.9 kg   Height:  5\' 4"  (1.626 m)     Final diagnoses:  Abdominal pain, lower  Vomiting in adult  Small bowel obstruction (HCC)    Admission/ observation were discussed with the admitting physician, patient and/or family and they are comfortable with the plan.    Final Clinical Impressions(s) / ED Diagnoses   Final diagnoses:  Abdominal pain, lower  Vomiting in adult  Small bowel obstruction [facility])     ED Discharge Orders    None       Elnora Morrison, MD 09/17/19 [phone]

## 2019-09-17 NOTE — Op Note (Signed)
[facility] Operative Note  09/17/19  Preoperative Diagnosis: Small bowel obstruction, concern ischemic bowel   Postoperative Diagnosis: Small bowel obstruction, internal hernia and ischemic bowel    Procedure(s) Performed: Exploratory laparotomy, small bowel resection with primary side to side stapled anastomosis    Surgeon: Lanell Matar. Constance Haw, MD   Assistants: No qualified resident was available    Anesthesia: General endotracheal   Anesthesiologist: Lenice Llamas, MD    Specimens: Small bowel   Estimated Blood Loss: Minimal   Blood Replacement: None    Complications: None   Wound Class: Clean contaminated    Operative Indications: Katelyn [name] is a 78 yo with acute onset of abdominal pain, nausea/vomiting with CT findings concerning for small bowel obstruction with signs of impending ischemia. We discussed the risk and benefits of surgery including but not limited to bleeding, infection, injury to other organs, need for resection, possible anastomosis versus ostomy, and she opted to proceed.   Findings: Cecal adhesions to the right pelvis, small bowel looping under adhesions forming an internal hernia with ischemia/ hemorrhagic small bowel    Procedure: The patient was taken to the operating room and placed supine. General endotracheal anesthesia was induced. Intravenous antibiotics were administered per protocol.  A nasogastric tube positioned to decompress the stomach had already been placed. A foley catheter was placed. The abdomen was prepared and draped in the usual sterile fashion.   A midline incision was made and carried down through to the fascia with electrocautery. Care was taken on entering the abdomen with Metzenbaum scissors.  There was ascites but this was clear. Some minor omental adhesions to the lower anterior abdominal wall were taken down. The proximal small bowel was dilated but healthy. The transverse and ascending colon were healthy.   There were 2 adhesions down from the cecum into the right pelvis that created a channel for the small bowel to enter, making an internal hernia. With great care, I lysed these adhesions with sharp dissection.  After these adhesions were lysed, I was able to eviscerate the distal small bowel and cecum.  The mid ileum about 30 cm from the ileocecal valve was ischemic with signs of hemorrhagic changes.  The remainder of the bowel was ran twice showing no signs of any serosal tears.  After several minutes the hemorrhagic / ischemic appearance of this 20 cm portion of bowel did not improve.  A small bowel resection was needed in order to prevent stricture as the bowel was likely viable but not healthy.  The proximal and distal points of transection were determined. The abdomen was toweled off.  A linear cutting 55 mm stapler was used to transect the specimen which was approximately 20 cm in length and about 30 cm from the ileocecal valve.  Allis clamps were used to align the two ends, and enterotomies were made.  A third linear cutting stapler 55 mm load was used to create a common channel on the antimesenteric border.  The common enterotomy was then closed with a TA stapler. The staple line was oversewn with lembert 3-0 silk sutures. The lumen was over 2 finger breadths in size. A crotch 3-0 silk suture was placed. The mesenteric defect was closed with 3-0 vicryl running suture.  The bowel was placed back into the abdomen in anatomic alignment. Omentum was placed over the anastomosis.  The abdomen was closed with 0 PDS suture. The skin was closed with staples.   Final inspection revealed acceptable hemostasis. All counts were  correct at the end of the case. The patient was awakened from anesthesia and extubated without complication.  The patient went to the PACU in stable condition.   Katelyn Labrum, MD [facility] [address] [place], [place] [address] [phone]  (office)

## 2019-09-17 NOTE — Progress Notes (Addendum)
[facility]  Called her son, Jeneen Rinks, [phone], notified that surgery is complete and had to do a bowel resection.   Discussed that in the past she has had a DNR but has been Full code since that time.  We are going to keep the Full code for now.  Notified son that she is going to ICU for monitoring.   Curlene Labrum, MD [facility] [address] [place], [place] [address] [phone] (office)

## 2019-09-17 NOTE — Anesthesia Preprocedure Evaluation (Signed)
Anesthesia Evaluation  Patient identified by MRN, date of birth, ID band Patient awake  General Assessment Comment:Pt denies issues with anesth when questioned  Pt states she is not allergic to Sevo that she is aware of   Reviewed: Allergy & Precautions, NPO status , Patient's Chart, lab work & pertinent test results  Airway Mallampati: III  TM Distance: >3 FB Neck ROM: Full    Dental no notable dental hx. (+) Teeth Intact   Pulmonary shortness of breath, asthma , COPD,  COPD inhaler and oxygen dependent, former smoker,    Pulmonary exam normal breath sounds clear to auscultation       Cardiovascular Exercise Tolerance: Poor hypertension, Normal cardiovascular examII Rhythm:Regular Rate:Normal  Denies known cardiac issues  State heart stopped in last hosp for COPD exacerbation   Neuro/Psych  Headaches, negative psych ROS   GI/Hepatic Neg liver ROS, PUD, GERD  Medicated and Controlled,NGT in place  Here for SBO with possible ischemic gut to OR for Exp lap   Endo/Other  negative endocrine ROS  Renal/GU negative Renal ROS  negative genitourinary   Musculoskeletal negative musculoskeletal ROS (+)   Abdominal   Peds negative pediatric ROS (+)  Hematology negative hematology ROS (+) anemia ,   Anesthesia Other Findings   Reproductive/Obstetrics negative OB ROS                             Anesthesia Physical Anesthesia Plan  ASA: IV and emergent  Anesthesia Plan: General   Post-op Pain Management:    Induction: Intravenous  PONV Risk Score and Plan: 3 and Ondansetron, Treatment may vary due to age or medical condition, Midazolam and Promethazine  Airway Management Planned: Oral ETT  Additional Equipment:   Intra-op Plan:   Post-operative Plan: Extubation in OR  Informed Consent: I have reviewed the patients History and Physical, chart, labs and discussed the procedure including  the risks, benefits and alternatives for the proposed anesthesia with the patient or authorized representative who has indicated his/her understanding and acceptance.     Dental advisory given  Plan Discussed with: CRNA  Anesthesia Plan Comments: (Plan Full PPE use Plan GETA D/W PT -WTP with same after Q&A D/w pt possibility of postop ventilation -voiced understanding WTP)        Anesthesia Quick Evaluation

## 2019-09-17 NOTE — H&P (Addendum)
[facility] History and Physical  Reason for Referral: SBO  Referring Physician:  Dr. Reather Converse  Chief Complaint    Abdominal Pain      Katelyn [name] is a 78 y.o. female.  HPI: Katelyn [name] is a 78 yo with a history of COPD on intermittent O2 at home, asthma who comes in with acute onset of abdominal pain that started last night and has not changed in intensity. She says she started having nausea and vomiting, and says her last BM was in the ED today.  She says that she has never had pain like this before and has not had any changes to her bowel leading up to this episode.  She has a history of cholecystectomy, open appendectomy, hysterectomy, and right inguinal hernia repair in the past.  She has had recent ED visit for COPD exacerbations and SOB. She was treated with steroid pack, antibiotics, and inhalers and sent home from the ED.  She says today her main complaint is the RLQ abdominal pain that is sharp in nature.  Past Medical History:  Diagnosis Date  . Asthma   . Chronic neck pain   . Chronic pain of left elbow   . Complication of anesthesia    hard to wake  . COPD (chronic obstructive pulmonary disease) ([place])   . Dysphagia    requiring multiple dilations in past  . Headache    Patient states she needs  eyeglasses  . Irregular heart beat   . S/P colonoscopy 2009   dr. Gala Romney: anal papilla, 2 cecal AVMs. Due in 2014  . S/P endoscopy 2012   Dr. Gala Romney: mild chronic gastritis, bulbar erosions, s/p 54-French Maloney dilation   . S/P endoscopy 2003, 2004, 2007   gastritis, duodenitis, s/p dilation, +H.pylori     Past Surgical History:  Procedure Laterality Date  . APPENDECTOMY    . BIOPSY  03/30/2018   Procedure: BIOPSY;  Surgeon: Daneil Dolin, MD;  Location: [facility];  Service: Gastroenterology;;  gastric  . CHOLECYSTECTOMY    . COLONOSCOPY  10/10/2008   RMR: 1. Anal papilla, otherwise normal rectum. 2. Two cecal arteriovenous malformations, colonic  mucosa apperaed normal.   . COLONOSCOPY N/A 05/10/2015   RMR: Rectal and colonic polyps removed as described above. Ascending colon AVMs . Rectal polyps were tubular adenomas. Next colonoscopy May 2021.  . ESOPHAGEAL DILATION N/A 05/10/2015   Procedure: ESOPHAGEAL DILATION;  Surgeon: Daneil Dolin, MD;  [facility]: [facility];  Service: Endoscopy;  Laterality: N/A;  . ESOPHAGOGASTRODUODENOSCOPY  04/02/2011   RMR: 1. Endoscopically normal -appearing esophagus status post passage of a Maloney dilator followed by biopsy. 2. Hiatal hernia, Mottling, and submucosal petechiae in teh gastric mucoas of uncertain significance status post biopsy. 3. Bulbar erosions as described above.   . ESOPHAGOGASTRODUODENOSCOPY N/A 05/10/2015   RMR: Normal esophagus status post Maloney dilation. Small hiatal hernia. Abnormal gastric because of doubtful clinical significance status post biopsy (benign)  . ESOPHAGOGASTRODUODENOSCOPY (EGD) WITH PROPOFOL N/A 03/30/2018   Procedure: ESOPHAGOGASTRODUODENOSCOPY (EGD) WITH PROPOFOL;  Surgeon: Daneil Dolin, MD;  [facility];  Service: Gastroenterology;  Laterality: N/A;  WITH DILATION   . INGUINAL HERNIA REPAIR    . MALONEY DILATION  03/30/2018   Procedure: Venia Minks DILATION;  Surgeon: Daneil Dolin, MD;  [facility]: [facility];  Service: Gastroenterology;;  . TONSILLECTOMY    . TOTAL ABDOMINAL HYSTERECTOMY      Family History  Problem Relation Age of Onset  .  Heart disease Mother   . Lung cancer Father   . Cancer Sister   . Diabetes Brother   . Colon cancer Neg Hx     Social History   Tobacco Use  . Smoking status: Former Smoker    Packs/day: 1.00    Years: 57.00    Pack years: 57.00    Types: Cigarettes    Start date: 08/18/1959    Quit date: 08/14/2016    Years since quitting: 3.0  . Smokeless tobacco: Never Used  Substance Use Topics  . Alcohol use: No    Alcohol/week: 0.0 standard drinks  . Drug use: No    Medications:  I have reviewed  the patient's current medications. Current Facility-Administered Medications  Medication Dose Route Frequency Provider Last Rate Last Dose  . 0.9 %  sodium chloride infusion   Intravenous Continuous Elnora Morrison, MD 125 mL/hr at 09/17/19 1204    . Benzocaine (HURRCAINE) 20 % mouth spray   Mouth/Throat Once Virl Cagey, MD      . Derrill Memo ON 09/18/2019] cefoTEtan (CEFOTAN) 2 g in sodium chloride 0.9 % 100 mL IVPB  2 g Intravenous On Call to OR Virl Cagey, MD      . Chlorhexidine Gluconate Cloth 2 % PADS 6 each  6 each Topical Once Virl Cagey, MD      . fentaNYL (SUBLIMAZE) injection 25 mcg  25 mcg Intravenous Once Virl Cagey, MD       Current Outpatient Medications  Medication Sig Dispense Refill Last Dose  . albuterol (PROVENTIL) (2.5 MG/3ML) 0.083% nebulizer solution Take 2.5 mg by nebulization every 4 (four) hours as needed for shortness of breath.     Marland Kitchen albuterol (VENTOLIN HFA) 108 (90 Base) MCG/ACT inhaler Inhale 2 puffs into the lungs every 6 (six) hours as needed for wheezing or shortness of breath. 6.7 g 0   . doxycycline (VIBRAMYCIN) 100 MG capsule Take 1 capsule (100 mg total) by mouth 2 (two) times daily. 20 capsule 0   . ferrous sulfate 325 (65 FE) MG tablet Take 1 tablet (325 mg total) by mouth daily with breakfast. 30 tablet 3   . Multiple Vitamins-Minerals (MULTIVITAMIN ADULT) CHEW Chew 2 each by mouth daily. Vitafusion gummy     . OXYGEN Inhale 4 L into the lungs daily.     . predniSONE (DELTASONE) 50 MG tablet 1 tablet PO daily 5 tablet 0   . roflumilast (DALIRESP) 500 MCG TABS tablet Take 0.5 tablets (250 mcg total) by mouth daily. 30 tablet 12    Scheduled: . Benzocaine   Mouth/Throat Once  . Chlorhexidine Gluconate Cloth  6 each Topical Once  . fentaNYL (SUBLIMAZE) injection  25 mcg Intravenous Once   Continuous: . sodium chloride 125 mL/hr at 09/17/19 1204  . [START ON 09/18/2019] cefoTEtan (CEFOTAN) IV      Allergies  Allergen Reactions   . Sevoflurane     Pt's son stated "she cannot take".    ROS:  A comprehensive review of systems was negative except for: Gastrointestinal: positive for abdominal pain, nausea and vomiting  Blood pressure (!) 158/78, pulse 61, temperature (!) 97.5 F (36.4 C), temperature source Oral, resp. rate 20, height 5\' 4"  (1.626 m), weight 49.9 kg, SpO2 98 %. Physical Exam Vitals signs reviewed.  Constitutional:      Appearance: She is well-developed.  HENT:     Head: Normocephalic.  Eyes:     Extraocular Movements: Extraocular movements intact.  Cardiovascular:  Rate and Rhythm: Normal rate and regular rhythm.  Pulmonary:     Effort: Pulmonary effort is normal.     Breath sounds: Normal breath sounds.  Abdominal:     General: There is distension.     Palpations: Abdomen is soft.     Tenderness: There is abdominal tenderness in the right lower quadrant. There is no guarding or rebound.     Comments: Tender in the RLQ, some minor guarding, well healed RLQ scar, pfannenstiel scar  Skin:    General: Skin is warm and dry.  Neurological:     General: No focal deficit present.     Mental Status: She is alert and oriented to person, place, and time.  Psychiatric:        Mood and Affect: Mood normal.        Behavior: Behavior normal.     Results: Results for orders placed or performed during the hospital encounter of 09/17/19 (from the past 48 hour(s))  Lipase, blood     Status: None   Collection Time: 09/17/19  7:54 AM  Result Value Ref Range   Lipase 25 11 - 51 U/L    Comment: Performed at [facility], [address] [place], [place] [address]  Comprehensive metabolic panel     Status: Abnormal   Collection Time: 09/17/19  7:54 AM  Result Value Ref Range   Sodium 140 135 - 145 mmol/L   Potassium 4.3 3.5 - 5.1 mmol/L   Chloride 103 98 - 111 mmol/L   CO2 27 22 - 32 mmol/L   Glucose, Bld 162 (H) 70 - 99 mg/dL   BUN 23 8 - 23 mg/dL   Creatinine, Ser 1.00 0.44 - 1.00 mg/dL    Calcium 9.3 8.9 - 10.3 mg/dL   Total Protein 6.8 6.5 - 8.1 g/dL   Albumin 4.2 3.5 - 5.0 g/dL   AST 28 15 - 41 U/L   ALT 20 0 - 44 U/L   Alkaline Phosphatase 77 38 - 126 U/L   Total Bilirubin 0.6 0.3 - 1.2 mg/dL   GFR calc non Af Amer 55 (L) >60 mL/min   GFR calc Af Amer >60 >60 mL/min   Anion gap 10 5 - 15    Comment: Performed at [facility], [address], [place], [place] [address]  CBC     Status: Abnormal   Collection Time: 09/17/19  7:54 AM  Result Value Ref Range   WBC 13.9 (H) 4.0 - 10.5 K/uL   RBC 4.19 3.87 - 5.11 MIL/uL   Hemoglobin 12.2 12.0 - 15.0 g/dL   HCT 39.6 36.0 - 46.0 %   MCV 94.5 80.0 - 100.0 fL   MCH 29.1 26.0 - 34.0 pg   MCHC 30.8 30.0 - 36.0 g/dL   RDW 14.3 11.5 - 15.5 %   Platelets 385 150 - 400 K/uL   nRBC 0.0 0.0 - 0.2 %    Comment: Performed at [facility], [address] [place], [place] [address]  Urinalysis, Routine w reflex microscopic     Status: Abnormal   Collection Time: 09/17/19  7:54 AM  Result Value Ref Range   Color, Urine YELLOW YELLOW   APPearance CLEAR CLEAR   Specific Gravity, Urine 1.028 1.005 - 1.030   pH 7.0 5.0 - 8.0   Glucose, UA NEGATIVE NEGATIVE mg/dL   Hgb urine dipstick NEGATIVE NEGATIVE   Bilirubin Urine NEGATIVE NEGATIVE   Ketones, ur 5 (A) NEGATIVE mg/dL   Protein, ur NEGATIVE  NEGATIVE mg/dL   Nitrite NEGATIVE NEGATIVE   Leukocytes,Ua NEGATIVE NEGATIVE    Comment: Performed at [facility], [address], [place], [place] [address]  Lactic acid, plasma     Status: None   Collection Time: 09/17/19 12:42 PM  Result Value Ref Range   Lactic Acid, Venous 1.1 0.5 - 1.9 mmol/L    Comment: Performed at [facility], [address] [place], [place] [address]   Personally reviewed and reviewed with Dr. Thornton Papas -RLQ with small bowel that has no enhancement, very hazy and lots of mesentery edema, mesenteric vessels without good contrast compared to the left abdomen, abrupt transition seen in the right pelvis area, ileum is  thickened but is not gradual and cuts off right at the transition, concerning obstruction causing some degree of ischemia  Ct Abdomen Pelvis W Contrast  Result Date: 09/17/2019 CLINICAL DATA:  Abdominal pain and vomiting beginning this morning. EXAM: CT ABDOMEN AND PELVIS WITH CONTRAST TECHNIQUE: Multidetector CT imaging of the abdomen and pelvis was performed using the standard protocol following bolus administration of intravenous contrast. CONTRAST:  62mL OMNIPAQUE IOHEXOL 300 MG/ML  SOLN COMPARISON:  Chest CT-04/15/2018 FINDINGS: Lower chest: Limited visualization of the lower thorax is negative for focal airspace opacity or pleural effusion. Cardiomegaly.  No pericardial effusion. Hepatobiliary: Normal hepatic contour. No discrete hepatic lesions. Post cholecystectomy. No intra or extrahepatic biliary ductal dilatation. No ascites. Pancreas: Normal appearance of the pancreas. No pancreatic duct dilatation. No ascites. Spleen: Appearance of the spleen. Note is made of a small splenule about the splenic hilum. Note is made of a trace amount perisplenic fluid. Adrenals/Urinary Tract: There is symmetric enhancement and excretion of the bilateral kidneys. No definite renal stones on this postcontrast examination. Note is made of bilateral hypoattenuating renal lesions (right - image 20, series 7; left - image 15, series 7), both of which are too small to accurately characterize though favored to represent renal cysts. No urinary obstruction or perinephric stranding. Normal appearance the bilateral adrenal glands. Normal appearance of the urinary bladder given degree of distention. Stomach/Bowel: There is marked bowel wall thickening involving the terminal ileum with associated abrupt transition site (coronal image 31, series 5) with upstream distension of the small bowel and decompression of the downstream colon. There is no definitive swirling of the abdominal mesentery to suggest a volvulus or an internal  hernia. No evidence of perforation or drainable fluid collection. No pneumoperitoneum, pneumatosis or portal venous gas. No additional areas of bowel wall thickening are identified. The appendix is not visualized compatible with provided operative history. Vascular/Lymphatic: Atherosclerotic plaque within a tortuous but normal caliber abdominal aorta. The major branch vessels of the abdominal aorta appear patent on this non CTA examination with special attention paid to the distal tributaries the SMA supplying the distal ileum. Note is made of a circumaortic left renal vein. No bulky retroperitoneal, mesenteric, pelvic or inguinal lymphadenopathy. Reproductive: Post hysterectomy. There is a small amount reactive fluid within the lower pelvis without peripheral wall enhancement to suggest definable/drainable abscess. Other: Regional soft tissues appear normal. Musculoskeletal: No acute or aggressive osseous abnormalities. 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 Mild degenerative change the bilateral hips with joint space loss, subchondral sclerosis and osteophytosis. There is a prominent (approximately 1.1 cm) presumed loose body at the caudal medial aspect the right hip joint space though a discrete donor site is not identified. IMPRESSION: 1. Marked circumferential bowel wall thickening involving the distal  ileum with associated small-bowel obstruction - nonspecific though could be seen in the setting of a localized enteritis potentially of infectious, inflammatory (such as Crohn's) or vascular etiology (though note the distal tributaries of the SMA appear patent on this non CTA examination). Clinical correlation is advised. 2. Small amount of presumably reactive free fluid within the abdomen and pelvis without evidence of perforation or definable/drainable fluid collection. 3.  Aortic Atherosclerosis (ICD10-I70.0). Critical Value/emergent results were  called by telephone at the time of interpretation on 09/17/2019 at 11:30 am to [place] , who verbally acknowledged these results. Electronically Signed   By: Sandi Mariscal M.D.   On: 09/17/2019 11:35    Assessment & Plan:  ZYKERIAH WEISS is a 78 y.o. female with SBO with nausea/vomiting and very thickened and edematous bowel concerning for ongoing ischemia. She is pretty tender but not peritoneal.  She has no lactic acidosis, but has a slight leukocytosis. Given the possible concern for ischemia, I think the safest thing is to perform an exploratory laparotomy to look at the bowel. If the bowel is healthy but otherwise edematous from ileitis we will abort, and if there is signs of ischemia we will resect if needed.    -Discussed Exploratory laparotomy with the patient and the risk including but not limited to bleeding, infection, injury to other organ like ureter, need for ostomy versus anastomosis, and possibility that we look and everything is healthy and viable.  Discussed that the concern is ischemia bowel that could become necrotic and die and cause her to become very sick.   -Type and screen sent, discussed risk of getting blood and unlikely need, consent given  -COVID test pending, patient understands we have to repeat a test to do the rapid versus for OR today -Admission after surgery -Asked patient if she would want me to call her son, Katelyn [name] currently, and she said now. I will notify him after surgery.  -Fentanyl one time does given now for pain  -NPO / NG to be placed   All questions were answered to the satisfaction of the patient.   Virl Cagey 09/17/2019, 2:16 PM

## 2019-09-18 LAB — BASIC METABOLIC PANEL
Anion gap: 7 (ref 5–15)
BUN: 20 mg/dL (ref 8–23)
CO2: 27 mmol/L (ref 22–32)
Calcium: 8 mg/dL — ABNORMAL LOW (ref 8.9–10.3)
Chloride: 102 mmol/L (ref 98–111)
Creatinine, Ser: 0.9 mg/dL (ref 0.44–1.00)
GFR calc Af Amer: >60 mL/min (ref >60)
GFR calc non Af Amer: >60 mL/min (ref >60)
Glucose, Bld: 106 mg/dL — ABNORMAL HIGH (ref 70–99)
Potassium: 3.6 mmol/L (ref 3.5–5.1)
Sodium: 136 mmol/L (ref 135–145)

## 2019-09-18 LAB — CBC WITH DIFFERENTIAL/PLATELET
Abs Immature Granulocytes: 0.03 10*3/uL (ref 0.00–0.07)
Basophils Absolute: 0 10*3/uL (ref 0.0–0.1)
Basophils Relative: 0 %
Eosinophils Absolute: 0.1 10*3/uL (ref 0.0–0.5)
Eosinophils Relative: 1 %
HCT: 33.6 % — ABNORMAL LOW (ref 36.0–46.0)
Hemoglobin: 10.8 g/dL — ABNORMAL LOW (ref 12.0–15.0)
Immature Granulocytes: 0 %
Lymphocytes Relative: 12 %
Lymphs Abs: 1.2 10*3/uL (ref 0.7–4.0)
MCH: 29.9 pg (ref 26.0–34.0)
MCHC: 32.1 g/dL (ref 30.0–36.0)
MCV: 93.1 fL (ref 80.0–100.0)
Monocytes Absolute: 1.3 10*3/uL — ABNORMAL HIGH (ref 0.1–1.0)
Monocytes Relative: 14 %
Neutro Abs: 6.8 10*3/uL (ref 1.7–7.7)
Neutrophils Relative %: 73 %
Platelets: 323 10*3/uL (ref 150–400)
RBC: 3.61 MIL/uL — ABNORMAL LOW (ref 3.87–5.11)
RDW: 14.5 % (ref 11.5–15.5)
WBC: 9.4 10*3/uL (ref 4.0–10.5)
nRBC: 0 % (ref 0.0–0.2)

## 2019-09-18 LAB — MAGNESIUM: Magnesium: 1.8 mg/dL (ref 1.7–2.4)

## 2019-09-18 LAB — MRSA PCR SCREENING: MRSA by PCR: NEGATIVE

## 2019-09-18 LAB — PHOSPHORUS: Phosphorus: 4.1 mg/dL (ref 2.5–4.6)

## 2019-09-18 MED ORDER — ALBUTEROL SULFATE (2.5 MG/3ML) 0.083% IN NEBU
2.5000 mg | INHALATION_SOLUTION | Freq: Three times a day (TID) | RESPIRATORY_TRACT | Status: DC
  Administered 2019-09-18 – 2019-09-21 (×11): 2.5 mg via RESPIRATORY_TRACT
  Filled 2019-09-18 (×12): qty 3

## 2019-09-18 MED ORDER — PHENOL 1.4 % MT LIQD
1.0000 | OROMUCOSAL | Status: DC | PRN
  Administered 2019-09-18: 1 via OROMUCOSAL
  Filled 2019-09-18: qty 177

## 2019-09-18 MED ORDER — MAGNESIUM SULFATE 2 GM/50ML IV SOLN
2.0000 g | Freq: Once | INTRAVENOUS | Status: AC
  Administered 2019-09-18: 2 g via INTRAVENOUS
  Filled 2019-09-18: qty 50

## 2019-09-18 MED ORDER — POTASSIUM CHLORIDE 10 MEQ/100ML IV SOLN
10.0000 meq | INTRAVENOUS | Status: AC
  Administered 2019-09-18 (×3): 10 meq via INTRAVENOUS
  Filled 2019-09-18 (×3): qty 100

## 2019-09-18 NOTE — Progress Notes (Signed)
[facility] Progress Note  1 Day Post-Op  Subjective: Much more appropriate this AM compared to yesterday after surgery with confusion. Up and awake and oriented. Feeling fine and minimal pain. NG in place. No flatus or BM.  IS up to 700.   Objective: Vital signs in last 24 hours: Temp:  [98.1 F (36.7 C)-99.1 F (37.3 C)] 99.1 F (37.3 C) (09/26 0735) Pulse Rate:  [74-116] 77 (09/26 0900) Resp:  [13-26] 16 (09/26 0900) BP: (101-164)/(54-91) 110/64 (09/26 0900) SpO2:  [89 %-100 %] 98 % (09/26 0900) FiO2 (%):  [28 %] 28 % (09/25 2108) Last BM Date: 09/17/19  Intake/Output from previous day: 09/25 0701 - 09/26 0700 In: 1550 [I.V.:1550] Out: 1075 [Urine:675; Emesis/NG output:300; Blood:100] Intake/Output this shift: No intake/output data recorded.  General appearance: alert, cooperative and no distress Resp: normal work of breathing GI: soft, mildly distended, appropriately tender, staples c/d/i with honeycomb dressing, abdominal binder  Lab Results:  Recent Labs    09/17/19 0754 09/18/19 0420  WBC 13.9* 9.4  HGB 12.2 10.8*  HCT 39.6 33.6*  PLT 385 323   BMET Recent Labs    09/17/19 0754 09/18/19 0420  NA 140 136  K 4.3 3.6  CL 103 102  CO2 27 27  GLUCOSE 162* 106*  BUN 23 20  CREATININE 1.00 0.90  CALCIUM 9.3 8.0*   PT/INR No results for input(s): LABPROT, INR in the last 72 hours.  Studies/Results: Ct Abdomen Pelvis W Contrast  Result Date: 09/17/2019 CLINICAL DATA:  Abdominal pain and vomiting beginning this morning. EXAM: CT ABDOMEN AND PELVIS WITH CONTRAST TECHNIQUE: Multidetector CT imaging of the abdomen and pelvis was performed using the standard protocol following bolus administration of intravenous contrast. CONTRAST:  68mL OMNIPAQUE IOHEXOL 300 MG/ML  SOLN COMPARISON:  Chest CT-04/15/2018 FINDINGS: Lower chest: Limited visualization of the lower thorax is negative for focal airspace opacity or pleural effusion. Cardiomegaly.  No  pericardial effusion. Hepatobiliary: Normal hepatic contour. No discrete hepatic lesions. Post cholecystectomy. No intra or extrahepatic biliary ductal dilatation. No ascites. Pancreas: Normal appearance of the pancreas. No pancreatic duct dilatation. No ascites. Spleen: Appearance of the spleen. Note is made of a small splenule about the splenic hilum. Note is made of a trace amount perisplenic fluid. Adrenals/Urinary Tract: There is symmetric enhancement and excretion of the bilateral kidneys. No definite renal stones on this postcontrast examination. Note is made of bilateral hypoattenuating renal lesions (right - image 20, series 7; left - image 15, series 7), both of which are too small to accurately characterize though favored to represent renal cysts. No urinary obstruction or perinephric stranding. Normal appearance the bilateral adrenal glands. Normal appearance of the urinary bladder given degree of distention. Stomach/Bowel: There is marked bowel wall thickening involving the terminal ileum with associated abrupt transition site (coronal image 31, series 5) with upstream distension of the small bowel and decompression of the downstream colon. There is no definitive swirling of the abdominal mesentery to suggest a volvulus or an internal hernia. No evidence of perforation or drainable fluid collection. No pneumoperitoneum, pneumatosis or portal venous gas. No additional areas of bowel wall thickening are identified. The appendix is not visualized compatible with provided operative history. Vascular/Lymphatic: Atherosclerotic plaque within a tortuous but normal caliber abdominal aorta. The major branch vessels of the abdominal aorta appear patent on this non CTA examination with special attention paid to the distal tributaries the SMA supplying the distal ileum. Note is made of a circumaortic left renal  vein. No bulky retroperitoneal, mesenteric, pelvic or inguinal lymphadenopathy. Reproductive: Post  hysterectomy. There is a small amount reactive fluid within the lower pelvis without peripheral wall enhancement to suggest definable/drainable abscess. Other: Regional soft tissues appear normal. Musculoskeletal: No acute or aggressive osseous abnormalities. 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 Mild degenerative change the bilateral hips with joint space loss, subchondral sclerosis and osteophytosis. There is a prominent (approximately 1.1 cm) presumed loose body at the caudal medial aspect the right hip joint space though a discrete donor site is not identified. IMPRESSION: 1. Marked circumferential bowel wall thickening involving the distal ileum with associated small-bowel obstruction - nonspecific though could be seen in the setting of a localized enteritis potentially of infectious, inflammatory (such as Crohn's) or vascular etiology (though note the distal tributaries of the SMA appear patent on this non CTA examination). Clinical correlation is advised. 2. Small amount of presumably reactive free fluid within the abdomen and pelvis without evidence of perforation or definable/drainable fluid collection. 3.  Aortic Atherosclerosis (ICD10-I70.0). Critical Value/emergent results were called by telephone at the time of interpretation on 09/17/2019 at 11:30 am to [place] , who verbally acknowledged these results. Electronically Signed   By: Sandi Mariscal M.D.   On: 09/17/2019 11:35     Assessment/Plan: Ms. Rutherford is a 78 yo s/p Ex lap, SBR For ischemic bowel from internal hernia causing SBO. Doing fair this AM. Much more appropriate. -PRN for pain, and scheduled toradol -IS, OOB -Ambulate -Will keep monitor today -NPO, NG  -Lytes replaced, Repeat labs in AM -SCDs, heparin sq -Ambulate  -Foley out    LOS: 1 day    Virl Cagey 09/18/2019

## 2019-09-19 LAB — CBC WITH DIFFERENTIAL/PLATELET
Abs Immature Granulocytes: 0.02 10*3/uL (ref 0.00–0.07)
Basophils Absolute: 0 10*3/uL (ref 0.0–0.1)
Basophils Relative: 0 %
Eosinophils Absolute: 0.1 10*3/uL (ref 0.0–0.5)
Eosinophils Relative: 1 %
HCT: 33.3 % — ABNORMAL LOW (ref 36.0–46.0)
Hemoglobin: 10.4 g/dL — ABNORMAL LOW (ref 12.0–15.0)
Immature Granulocytes: 0 %
Lymphocytes Relative: 11 %
Lymphs Abs: 1 10*3/uL (ref 0.7–4.0)
MCH: 29.8 pg (ref 26.0–34.0)
MCHC: 31.2 g/dL (ref 30.0–36.0)
MCV: 95.4 fL (ref 80.0–100.0)
Monocytes Absolute: 1.2 10*3/uL — ABNORMAL HIGH (ref 0.1–1.0)
Monocytes Relative: 13 %
Neutro Abs: 6.6 10*3/uL (ref 1.7–7.7)
Neutrophils Relative %: 75 %
Platelets: 297 10*3/uL (ref 150–400)
RBC: 3.49 MIL/uL — ABNORMAL LOW (ref 3.87–5.11)
RDW: 14.4 % (ref 11.5–15.5)
WBC: 8.9 10*3/uL (ref 4.0–10.5)
nRBC: 0 % (ref 0.0–0.2)

## 2019-09-19 LAB — MAGNESIUM: Magnesium: 2.2 mg/dL (ref 1.7–2.4)

## 2019-09-19 LAB — BASIC METABOLIC PANEL
Anion gap: 10 (ref 5–15)
BUN: 22 mg/dL (ref 8–23)
CO2: 27 mmol/L (ref 22–32)
Calcium: 8 mg/dL — ABNORMAL LOW (ref 8.9–10.3)
Chloride: 102 mmol/L (ref 98–111)
Creatinine, Ser: 0.87 mg/dL (ref 0.44–1.00)
GFR calc Af Amer: >60 mL/min (ref >60)
GFR calc non Af Amer: >60 mL/min (ref >60)
Glucose, Bld: 73 mg/dL (ref 70–99)
Potassium: 3.9 mmol/L (ref 3.5–5.1)
Sodium: 139 mmol/L (ref 135–145)

## 2019-09-19 LAB — PHOSPHORUS: Phosphorus: 3.9 mg/dL (ref 2.5–4.6)

## 2019-09-19 NOTE — Progress Notes (Signed)
[facility] Progress Note  2 Days Post-Op  Subjective: No flatus or BM. Awake and wanting to get out of bed. Pain improving.   Objective: Vital signs in last 24 hours: Temp:  [98 F (36.7 C)-99.2 F (37.3 C)] 99 F (37.2 C) (09/27 1126) Pulse Rate:  [84-97] 89 (09/27 1200) Resp:  [16-24] 22 (09/27 1200) BP: (104-125)/(53-94) 118/56 (09/27 1200) SpO2:  [88 %-98 %] 92 % (09/27 1200) Weight:  [47.1 kg] 47.1 kg (09/27 0500) Last BM Date: 09/17/19  Intake/Output from previous day: 09/26 0701 - 09/27 0700 In: 1865.3 [I.V.:1576.3; IV Piggyback:288.9] Out: 1340 [Urine:540; Emesis/NG output:800] Intake/Output this shift: Total I/O In: 203.3 [I.V.:203.3] Out: -   General appearance: alert, cooperative and no distress Resp: normal work of breathing GI: soft, mildly distended, appropriately tender, honeycomb in place, staples c/d/i with no erythema or drainage   Lab Results:  Recent Labs    09/18/19 0420 09/19/19 0457  WBC 9.4 8.9  HGB 10.8* 10.4*  HCT 33.6* 33.3*  PLT 323 297   BMET Recent Labs    09/18/19 0420 09/19/19 0457  NA 136 139  K 3.6 3.9  CL 102 102  CO2 27 27  GLUCOSE 106* 73  BUN 20 22  CREATININE 0.90 0.87  CALCIUM 8.0* 8.0*    Assessment/Plan: Ms. 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 Doing well -PRN for pain, scheduled toradol -Inhalers, IS at about 500 today, encouraged -OOB and ambulate -NPO NG until bowel function -Lytes good, BMP tomorrow, LR @50  -Afebrile and no leukocytosis -SCDs, heparin sq  -Call [name] for update [phone] -Moving up to 3rd floor today   LOS: 2 days    Virl Cagey 09/19/2019

## 2019-09-20 ENCOUNTER — Encounter (HOSPITAL_COMMUNITY): Payer: Self-pay | Admitting: General Surgery

## 2019-09-20 LAB — PHOSPHORUS: Phosphorus: 2.9 mg/dL (ref 2.5–4.6)

## 2019-09-20 LAB — BASIC METABOLIC PANEL
Anion gap: 14 (ref 5–15)
BUN: 21 mg/dL (ref 8–23)
CO2: 23 mmol/L (ref 22–32)
Calcium: 8 mg/dL — ABNORMAL LOW (ref 8.9–10.3)
Chloride: 104 mmol/L (ref 98–111)
Creatinine, Ser: 0.79 mg/dL (ref 0.44–1.00)
GFR calc Af Amer: >60 mL/min (ref >60)
GFR calc non Af Amer: >60 mL/min (ref >60)
Glucose, Bld: 70 mg/dL (ref 70–99)
Potassium: 3.7 mmol/L (ref 3.5–5.1)
Sodium: 141 mmol/L (ref 135–145)

## 2019-09-20 LAB — MAGNESIUM: Magnesium: 1.9 mg/dL (ref 1.7–2.4)

## 2019-09-20 MED ORDER — ENOXAPARIN SODIUM 40 MG/0.4ML ~~LOC~~ SOLN
40.0000 mg | SUBCUTANEOUS | Status: DC
  Administered 2019-09-20 – 2019-09-21 (×2): 40 mg via SUBCUTANEOUS
  Filled 2019-09-20 (×2): qty 0.4

## 2019-09-20 MED ORDER — ROFLUMILAST 500 MCG PO TABS
250.0000 ug | ORAL_TABLET | Freq: Every day | ORAL | Status: DC
  Administered 2019-09-20 – 2019-09-21 (×2): 250 ug via ORAL
  Filled 2019-09-20 (×2): qty 1

## 2019-09-20 MED ORDER — OXYCODONE HCL 5 MG PO TABS: 5.0000 mg | ORAL_TABLET | ORAL | Status: DC | PRN

## 2019-09-20 MED ORDER — ADULT MULTIVITAMIN W/MINERALS CH
ORAL_TABLET | Freq: Every day | ORAL | Status: DC
  Administered 2019-09-20 – 2019-09-21 (×2): 1 via ORAL
  Filled 2019-09-20 (×2): qty 1

## 2019-09-20 MED ORDER — ACETAMINOPHEN 325 MG PO TABS: 650.0000 mg | ORAL_TABLET | Freq: Four times a day (QID) | ORAL | Status: DC | PRN

## 2019-09-20 MED ORDER — POTASSIUM CHLORIDE 20 MEQ PO PACK
40.0000 meq | PACK | Freq: Once | ORAL | Status: AC
  Administered 2019-09-20: 40 meq via ORAL
  Filled 2019-09-20: qty 2

## 2019-09-20 NOTE — Progress Notes (Signed)
[facility] Progress Note  3 Days Post-Op  Subjective: Having Bm this Am. Feeling good. No complaints.   Objective: Vital signs in last 24 hours: Temp:  [98.4 F (36.9 C)-99.3 F (37.4 C)] 98.4 F (36.9 C) (09/28 0623) Pulse Rate:  [85-109] 103 (09/28 0623) Resp:  [18-23] 18 (09/28 0623) BP: (109-118)/(56-69) 112/62 (09/28 0623) SpO2:  [91 %-96 %] 94 % (09/28 0752) Last BM Date: 09/20/19  Intake/Output from previous day: 09/27 0701 - 09/28 0700 In: 203.3 [I.V.:203.3] Out: -  Intake/Output this shift: No intake/output data recorded.  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 Doing well -PRN for pain, added tylenol and roxicodone  -Inhalers, IS -OOB and ambulate -Ng out, clear diet, go slowly -Lytes good, BMP tomorrow, LR @50  -Afebrile and no signs of infection -SCDs, heparin sq changing to lovenox  -George Ina for update [phone] -Hopefully home in next few days    LOS: 3 days    Virl Cagey 09/20/2019

## 2019-09-20 NOTE — TOC Initial Note (Signed)
Transition of Care [facility]) - Initial/Assessment Note    Patient Details  Name: Katelyn [name] MRN: [id number] Date of Birth: Sep 22, 1941  Transition of Care [facility]) CM/SW Contact:    Trish Mage, LCSW Phone Number: 09/20/2019, 2:44 PM  Clinical Narrative:    Katelyn [name] was seen due to high risk for readmission.  She and her adult son live together.  Prior to hospitalization, she was doing all her ADL's, driving, taking care of the home, and she has no concerns that she will not be able to do that when she returns.  She has been getting up and walking in the hospital room as her form of "therapy."  She states Dr Luan Pulling is her PCP, that all her medications have been affordable up to now,  ande that she will consult with him if she has any problems with costs.  TOC will continue to follow during the  Course of this hospitalization.             Expected Discharge Plan: Home/Self Care     Patient Goals and CMS Choice        Expected Discharge Plan and Services Expected Discharge Plan: Home/Self Care   Discharge Planning Services: CM Consult   Living arrangements for the past 2 months: Single Family Home Expected Discharge Date: 09/21/19                                    Prior Living Arrangements/Services Living arrangements for the past 2 months: Single Family Home Lives with:: Adult Children Patient language and need for interpreter reviewed:: Yes Do you feel safe going back to the place where you live?: Yes      Need for Family Participation in Patient Care: Yes (Comment) Care giver support system in place?: Yes (comment)   Criminal Activity/Legal Involvement Pertinent to Current Situation/Hospitalization: No - Comment as needed  Activities of Daily Living Home Assistive Devices/Equipment: Oxygen, Eyeglasses ADL Screening (condition at time of admission) Patient's cognitive ability adequate to safely complete daily activities?: Yes Is the patient deaf or have  difficulty hearing?: No Does the patient have difficulty seeing, even when wearing glasses/contacts?: No Does the patient have difficulty concentrating, remembering, or making decisions?: No Patient able to express need for assistance with ADLs?: Yes Does the patient have difficulty dressing or bathing?: No Independently performs ADLs?: Yes (appropriate for developmental age) Does the patient have difficulty walking or climbing stairs?: No Weakness of Legs: None Weakness of Arms/Hands: None  Permission Sought/Granted                  Emotional Assessment Appearance:: Appears stated age Attitude/Demeanor/Rapport: Engaged Affect (typically observed): Appropriate Orientation: : Oriented to Self, Oriented to Place, Oriented to  Time, Oriented to Situation Alcohol / Substance Use: Not Applicable Psych Involvement: No (comment)  Admission diagnosis:  Small bowel obstruction ([place]) [K56.609] Abdominal pain, lower [R10.30] Vomiting in adult [R11.10] Patient Active Problem List   Diagnosis Date Noted  . Small bowel ischemia ([place]) 09/17/2019  . Ischemic bowel disease ([place]) 09/17/2019  . Small bowel obstruction ([place])   . Acute on chronic respiratory failure with hypoxia ([place]) 03/17/2019  . IDA (iron deficiency anemia) 07/04/2018  . Acute metabolic encephalopathy [id number]  . Acute respiratory failure with hypoxia and hypercapnia ([place]) 07/02/2018  . GERD (gastroesophageal reflux disease) 06/13/2018  . Malnutrition ([place]) 06/13/2018  . Essential hypertension 06/13/2018  . Tachycardia  04/14/2018  . Acute gastritis 03/31/2018  . Esophagitis 03/31/2018  . Duodenal ulcer 03/31/2018  . Goals of care, counseling/discussion   . DNR (do not resuscitate) discussion   . Palliative care by specialist   . COPD with acute exacerbation ([place]) 03/29/2018  . COPD (chronic obstructive pulmonary disease) ([place]) 02/03/2018  . Hypoxia 11/28/2015  . COPD exacerbation ([place]) 11/28/2015  . Acute  respiratory failure with hypoxia ([place]) 11/28/2015  . SOB (shortness of breath) 11/28/2015  . Mucosal abnormality of stomach   . History of colonic polyps 04/25/2015  . 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 Dysphagia 03/11/2011   PCP:  Sinda Du, MD Pharmacy:   [place], [address] [place] [address] Phone: [phone] Fax: [phone]  [place], [place] - [place] [address] [place] [address] [place] [address] Phone: [phone] Fax: [phone]     Social Determinants of Health (SDOH) Interventions    Readmission Risk Interventions Readmission Risk Prevention Plan 03/19/2019  Transportation Screening Complete  PCP or Specialist Appt within 5-7 Days Complete  Home Care Screening Complete  Medication Review (RN CM) Complete  Some recent data might be hidden

## 2019-09-20 NOTE — Progress Notes (Signed)
Pt had a medium solid formed bowel movement this morning at 0530. It was dark but pt said it was not painful and she did not wipe any blood.

## 2019-09-20 NOTE — Care Management Important Message (Signed)
Important Message  Patient Details  Name: Katelyn [name] MRN: [id number] Date of Birth: 1941/10/19   Medicare Important Message Given:  Yes     Tommy Medal 09/20/2019, 11:40 AM

## 2019-09-20 NOTE — Anesthesia Postprocedure Evaluation (Signed)
Anesthesia Post Note  Patient: Katelyn [name]  Procedure(s) Performed: small bowel resection (N/A Abdomen)  Patient location during evaluation: PACU Anesthesia Type: General Level of consciousness: awake and alert Pain management: pain level controlled Vital Signs Assessment: post-procedure vital signs reviewed and stable Respiratory status: spontaneous breathing and patient connected to nasal cannula oxygen Cardiovascular status: blood pressure returned to baseline Postop Assessment: no apparent nausea or vomiting Anesthetic complications: no Comments: Late entry     Last Vitals:  Vitals:   09/20/19 0623 09/20/19 0752  BP: 112/62   Pulse: (!) 103   Resp: 18   Temp: 36.9 C   SpO2: 96% 94%    Last Pain:  Vitals:   09/20/19 0623  TempSrc: Oral  PainSc:                  [name]

## 2019-09-21 LAB — BASIC METABOLIC PANEL
Anion gap: 12 (ref 5–15)
BUN: 9 mg/dL (ref 8–23)
CO2: 25 mmol/L (ref 22–32)
Calcium: 8.4 mg/dL — ABNORMAL LOW (ref 8.9–10.3)
Chloride: 103 mmol/L (ref 98–111)
Creatinine, Ser: 0.63 mg/dL (ref 0.44–1.00)
GFR calc Af Amer: >60 mL/min (ref >60)
GFR calc non Af Amer: >60 mL/min (ref >60)
Glucose, Bld: 101 mg/dL — ABNORMAL HIGH (ref 70–99)
Potassium: 3.5 mmol/L (ref 3.5–5.1)
Sodium: 140 mmol/L (ref 135–145)

## 2019-09-21 MED ORDER — DOCUSATE SODIUM 100 MG PO CAPS: 100.0000 mg | ORAL_CAPSULE | Freq: Two times a day (BID) | ORAL | 2 refills | Status: DC | PRN

## 2019-09-21 MED ORDER — ONDANSETRON 4 MG PO TBDP: 4.0000 mg | ORAL_TABLET | Freq: Four times a day (QID) | ORAL | 0 refills | Status: DC | PRN

## 2019-09-21 MED ORDER — OXYCODONE HCL 5 MG PO TABS: 5.0000 mg | ORAL_TABLET | ORAL | 0 refills | Status: DC | PRN

## 2019-09-21 NOTE — Discharge Summary (Signed)
Physician Discharge Summary  Patient ID: Katelyn [name] MRN: [id number] DOB/AGE: 1941-03-10 78 y.o.  Admit date: 09/17/2019 Discharge date: 09/21/2019  Admission Diagnoses: Small bowel obstruction   Discharge Diagnoses:  Active Problems:   Small bowel ischemia (HCC)   Ischemic bowel disease [facility])   Discharged Condition: good  Hospital Course: [name] is a very pleasant 78 yo who came to the hospital with acute onset of abdominal pain, nausea and vomiting and no BM. She was found to have a SBO on CT but the bowel looked edematous and there was some concern for ischemia given the appearance of the bowel and the mesentery.  I opted to take her back for exploration and found an internal hernia with ischemic bowel which was resected.  The patient did well post op. She went to the ICU post op for monitoring.  She improved greatly, and by POD 3 she started having bowel function. She was ambulating without issues and having good pain control with oral medication.  She was discharged home on POD 4. I notified her son regarding this discharge.   Consults: None  Significant Diagnostic Studies: CT a/p- dilated proximal bowel, distal bowel decompressed and edematous, concern for ischemia   Treatments: 09/17/19- Ex lap, SBR with primary anastomosis   Discharge Exam: Blood pressure 126/68, pulse 96, temperature 98.6 F (37 C), temperature source Oral, resp. rate 20, height 5\' 4"  (1.626 m), weight 47.1 kg, SpO2 97 %. General appearance: alert, cooperative and no distress Resp: normal work of breathing GI: soft, appropriately tender, mildy distended, staples c/d/i with no erythema or drainage Extremities: extremities normal, atraumatic, no cyanosis or edema  Disposition: Discharge disposition: 01-Home or Self Care       Discharge Instructions    Call MD for:  difficulty breathing, headache or visual disturbances   Complete by: As directed    Call MD for:  extreme fatigue   Complete by: As  directed    Call MD for:  persistant dizziness or light-headedness   Complete by: As directed    Call MD for:  persistant nausea and vomiting   Complete by: As directed    Call MD for:  redness, tenderness, or signs of infection (pain, swelling, redness, odor or green/yellow discharge around incision site)   Complete by: As directed    Call MD for:  severe uncontrolled pain   Complete by: As directed    Call MD for:  temperature >100.4   Complete by: As directed    Increase activity slowly   Complete by: As directed      Allergies as of 09/21/2019      Reactions   Sevoflurane    Pt's son stated "she cannot take".      Medication List    TAKE these medications   albuterol (2.5 MG/3ML) 0.083% nebulizer solution Commonly known as: PROVENTIL Take 2.5 mg by nebulization every 4 (four) hours as needed for shortness of breath.   albuterol 108 (90 Base) MCG/ACT inhaler Commonly known as: VENTOLIN HFA Inhale 2 puffs into the lungs every 6 (six) hours as needed for wheezing or shortness of breath.   docusate sodium 100 MG capsule Commonly known as: Colace Take 1 capsule (100 mg total) by mouth 2 (two) times daily as needed for mild constipation.   doxycycline 100 MG capsule Commonly known as: VIBRAMYCIN Take 1 capsule (100 mg total) by mouth 2 (two) times daily.   ferrous sulfate 325 (65 FE) MG tablet Take 1 tablet (325  mg total) by mouth daily with breakfast.   Multivitamin Adult Chew Chew 2 each by mouth daily. Vitafusion gummy   ondansetron 4 MG disintegrating tablet Commonly known as: ZOFRAN-ODT Take 1 tablet (4 mg total) by mouth every 6 (six) hours as needed for nausea.   oxyCODONE 5 MG immediate release tablet Commonly known as: Oxy IR/ROXICODONE Take 1 tablet (5 mg total) by mouth every 4 (four) hours as needed for severe pain or breakthrough pain.   OXYGEN Inhale 4 L into the lungs daily.   roflumilast 500 MCG Tabs tablet Commonly known as: DALIRESP Take 0.5  tablets (250 mcg total) by mouth daily.      Follow-up Information    Sinda Du, MD Follow up on 10/06/2019.   Specialty: Pulmonary Disease Why: Wednesday at noon for your hospital follow up appointment Contact information: [address] [place] [address] [phone]          Future Appointments  Date Time Provider [place]  09/22/2019  1:00 PM Neldon Labella, RN [facility] None  09/30/2019  9:15 AM Aviva Signs, MD [facility] None  10/12/2019  9:00 AM Virl Cagey, MD [facility] None    Follow up with Dr. Arnoldo Morale next week for staple removal and Dr. Constance Haw after for check.   Signed: Virl Cagey 09/21/2019, 3:13 PM

## 2019-09-21 NOTE — Discharge Instructions (Signed)
Discharge Open Abdominal Surgery Instructions:  Common Complaints: Pain at the incision site is common. This will improve with time. Take your pain medications as described below. Some nausea is common and poor appetite. The main goal is to stay hydrated the first few days after surgery.   Diet/ Activity: Diet as tolerated. You have started and tolerated a diet in the hospital, and should continue to increase what you are able to eat.   You may not have a large appetite, but it is important to stay hydrated. Drink 64 ounces of water a day. Your appetite will return with time.  Keep a dry dressing in place over your staples daily or as needed. Some minor pink/ blood tinged drainage is expected. This will stop in a few days after surgery.  Shower per your regular routine daily.  Do not take hot showers. Take warm showers that are less than 10 minutes. Path the incision dry. Wear an abdominal binder daily with activity. You do not have to wear this while sleeping or sitting.  Rest and listen to your body, but do not remain in bed all day.  Walk everyday for at least 15-20 minutes. Deep cough and move around every 1-2 hours in the first few days after surgery.  Do not lift > 10 lbs, perform excessive bending, pushing, pulling, squatting for 6-8 weeks after surgery.  The activity restrictions and the abdominal binder are to prevent hernia formation at your incision while you are healing.  Do not place lotions or balms on your incision unless instructed to specifically by Dr. Constance Haw.   Medication: Take tylenol and ibuprofen as needed for pain control, alternating every 4-6 hours.  Example:  Tylenol 1000mg  @ 6am, 12noon, 6pm, 70midnight (Do not exceed 4000mg  of tylenol a day). Ibuprofen 800mg  @ 9am, 3pm, 9pm, 3am (Do not exceed 3600mg  of ibuprofen a day).  Take Roxicodone for breakthrough pain every 4 hours.  Take Colace for constipation related to narcotic pain medication. If you do not have a  bowel movement in 2 days, take Miralax over the counter.  Drink plenty of water to also prevent constipation.   Contact Information: If you have questions or concerns, please call our office, [phone], Monday- Thursday 8AM-5PM and Friday 8AM-12Noon.  If it is after hours or on the weekend, please call [facility], [phone], and ask to speak to the surgeon on call for Dr. Constance Haw at [facility].    Open Small Bowel Resection, Care After This sheet gives you information about how to care for yourself after your procedure. Your health care provider may also give you more specific instructions. If you have problems or questions, contact your health care provider. What can I expect after the procedure? After the procedure, it is common to have:  Pain in your abdomen, especially along your incision. You will be given pain medicines to control this.  Tiredness. This is a normal part of the recovery process. Your energy level will return to normal over the next several weeks.  Constipation. You may be given a stool softener to help prevent this. Follow these instructions at home: Medicines  Take over-the-counter and prescription medicines only as told by your health care provider.  Do not drive or use heavy machinery while taking prescription pain medicine.  If you were prescribed an antibiotic medicine, use it as told by your health care provider. Do not stop using the antibiotic even if you start to feel better. Activity  Return to your  normal activities as told by your health care provider. Ask your health care provider what activities are safe for you.  Do not lift anything that is heavier than 10 lb (4.5 kg) until your health care provider says that it is safe.  Take frequent rest breaks during the day as needed.  Try to take short walks every day for the amount of time that your health care provider suggests.  Avoid activities that require a lot of effort (are  strenuous) for as long as told by your health care provider. Incision care   Follow instructions from your health care provider about how to take care of your incision. Make sure you: ? Wash your hands with soap and water before you change your bandage (dressing). If soap and water are not available, use hand sanitizer. ? Change your dressing as told by your health care provider. ? Leave stitches (sutures), skin glue, or adhesive strips in place. These skin closures may need to stay in place for 2 weeks or longer. If adhesive strip edges start to loosen and curl up, you may trim the loose edges. Do not remove adhesive strips completely unless your health care provider tells you to do that.  Check your incision area every day for signs of infection. Check for: ? More redness, swelling, or pain. ? More fluid or blood. ? Warmth. ? Pus or a bad smell.  Do not take baths, swim, or use a hot tub until your health care provider approves. Ask your health care provider if you can take showers. You may only be allowed to take sponge baths for bathing. General instructions  Continue to practice deep breathing and coughing. If it hurts to cough, try holding a pillow against your abdomen as you cough.  To prevent or treat constipation while you are taking prescription pain medicine, your health care provider may recommend that you: ? Drink enough fluid to keep your urine clear or pale yellow. ? Take over-the-counter or prescription medicines. ? Eat foods that are high in fiber, such as fresh fruits and vegetables, whole grains, and beans. ? Limit foods that are high in fat and processed sugars, such as fried and sweet foods.  Do not use any products that contain nicotine or tobacco as told by your health care provider. These include cigarettes and e-cigarettes. If you need help quitting, ask your health care provider.  Keep all follow-up visits as told by your health care provider. This is  important. Contact a health care provider if:  You have pain that is not relieved with medicine.  You do not feel like eating.  You feel nauseous or you vomit.  You have constipation that is not relieved with prescribed stool softeners.  You have more redness, swelling, or pain around your incision.  You have more fluid or blood coming from your incision.  Your incision feels warm to the touch.  You have pus or a bad smell coming from your incision.  You have a fever. Get help right away if:  Your pain gets worse, even after you take pain medicine.  Your legs or arms hurt or become red or swollen.  You have chest pain.  You have trouble breathing. This information is not intended to replace advice given to you by your health care provider. Make sure you discuss any questions you have with your health care provider. Document Released: 05/13/2011 Document Revised: 11/21/2017 Document Reviewed: 09/09/2016 Elsevier Patient Education  2020 [place]  Eating Plan A soft-food eating plan includes foods that are safe and easy to chew and swallow. Your health care provider or dietitian can help you find foods and flavors that fit into this plan. Follow this plan until your health care provider or dietitian says it is safe to start eating other foods and food textures. What are tips for following this plan? General guidelines   Take small bites of food, or cut food into pieces about  inch or smaller. Bite-sized pieces of food are easier to chew and swallow.  Eat moist foods. Avoid overly dry foods.  Avoid foods that: ? Are difficult to swallow, such as dry, chunky, crispy, or sticky foods. ? Are difficult to chew, such as hard, tough, or stringy foods. ? Contain nuts, seeds, or fruits.  Follow instructions from your dietitian about the types of liquids that are safe for you to swallow. You may be allowed to have: ? Thick liquids only. This includes only  liquids that are thicker than honey. ? Thin and thick liquids. This includes all beverages and foods that become liquid at room temperature.  To make thick liquids: ? Purchase a commercial liquid thickening powder. These are available at grocery stores and pharmacies. ? Mix the thickener into liquids according to instructions on the label. ? Purchase ready-made thickened liquids. ? Thicken soup by pureeing, straining to remove chunks, and adding flour, potato flakes, or corn starch. ? Add commercial thickener to foods that become liquid at room temperature, such as milk shakes, yogurt, ice cream, gelatin, and sherbet.  Ask your health care provider whether you need to take a fiber supplement. Cooking  Cook meats so they stay tender and moist. Use methods like braising, stewing, or baking in liquid.  Cook vegetables and fruit until they are soft enough to be mashed with a fork.  Peel soft, fresh fruits such as peaches, nectarines, and melons.  When making soup, make sure chunks of meat and vegetables are smaller than  inch.  Reheat leftover foods slowly so that a tough crust does not form. What foods are allowed? The items listed below may not be a complete list. Talk with your dietitian about what dietary choices are best for you. Grains Breads, muffins, pancakes, or waffles moistened with syrup, jelly, or butter. Dry cereals well-moistened with milk. Moist, cooked cereals. Well-cooked pasta and rice. Vegetables All soft-cooked vegetables. Shredded lettuce. Fruits All canned and cooked fruits. Soft, peeled fresh fruits. Strawberries. Dairy Milk. Cream. Yogurt. Cottage cheese. Soft cheese without the rind. Meats and other protein foods Tender, moist ground meat, poultry, or fish. Meat cooked in gravy or sauces. Eggs. Sweets and desserts Ice cream. Milk shakes. Sherbet. Pudding. Fats and oils Butter. Margarine. Olive, canola, sunflower, and grapeseed oil. Smooth salad dressing.  Smooth cream cheese. Mayonnaise. Gravy. What foods are not allowed? The items listed bemay not be a complete list. Talk with your dietitian about what dietary choices are best for you. Grains Coarse or dry cereals, such as bran, granola, and shredded wheat. Tough or chewy crusty breads, such as [place] bread or baguettes. Breads with nuts, seeds, or fruit. Vegetables All raw vegetables. Cooked corn. Cooked vegetables that are tough or stringy. Tough, crisp, fried potatoes and potato skins. Fruits Fresh fruits with skins or seeds, or both, such as apples, pears, and grapes. Stringy, high-pulp fruits, such as papaya, pineapple, coconut, and mango. Fruit leather and all dried fruit. Dairy Yogurt with nuts or coconut. Meats and other protein foods Hard,  dry sausages. Dry meat, poultry, or fish. Meats with gristle. Fish with bones. Fried meat or fish. Lunch meat and hotdogs. Nuts and seeds. Chunky peanut butter or other nut butters. Sweets and desserts Cakes or cookies that are very dry or chewy. Desserts with dried fruit, nuts, or coconut. Fried pastries. Very rich pastries. Fats and oils Cream cheese with fruit or nuts. Salad dressings with seeds or chunks. Summary  A soft-food eating plan includes foods that are safe and easy to swallow. Generally, the foods should be soft enough to be mashed with a fork.  Avoid foods that are dry, hard to chew, crunchy, sticky, stringy, or crispy.  Ask your health care provider whether you need to thicken your liquids and if you need to take a fiber supplement. This information is not intended to replace advice given to you by your health care provider. Make sure you discuss any questions you have with your health care provider. Document Released: 03/17/2008 Document Revised: 04/01/2019 Document Reviewed: 02/11/2017 Elsevier Patient Education  2020 [employer].

## 2019-09-22 ENCOUNTER — Other Ambulatory Visit: Payer: Self-pay

## 2019-09-22 NOTE — Patient Outreach (Signed)
[facility]) Care Management  09/22/2019  Katelyn [name] [birth year] [id number]   Attempted outreach with Katelyn [name] post hospital discharge. Per chart review, she was admitted on 09/17/19 due to a small bowel obstruction. She was discharged on 09/21/19. Outreach unsuccessful. Phone rang without option to leave a voice message.   PLAN -Will follow-up within 3-4 business days.  [facility] [phone]

## 2019-09-28 ENCOUNTER — Other Ambulatory Visit: Payer: Self-pay

## 2019-09-28 NOTE — Patient Outreach (Signed)
[name] Care Management  09/28/2019  Katelyn [name] Jun 04, 1941 [id number]    Successful outreach with Katelyn [name]. Reports feeling "pretty good" since hospital discharge on 09/21/19. She denies complaints of abdominal pain. Reports tolerating small meals without difficulty. Reports "normal" soft stools. She is aware of worsening s/sx and indications for seeking medical attention. She is pending follow-up with General Surgery on 09/30/19.   She reports having the prescribed medications. No new concerns regarding medication management or prescription costs.  Katelyn [name] denies changes in care management needs. Reports ambulating well and performing ADL's independently since being discharged. She has post discharge appointments within the next two weeks. She anticipates her son being available to provide transportation. Declines current need for transportation assistance. Agreed to call if needed prior to next outreach. Current Outpatient Medications on File Prior to Visit  Medication Sig Dispense Refill  . albuterol (PROVENTIL) (2.5 MG/3ML) 0.083% nebulizer solution Take 2.5 mg by nebulization every 4 (four) hours as needed for shortness of breath.    Marland Kitchen albuterol (VENTOLIN HFA) 108 (90 Base) MCG/ACT inhaler Inhale 2 puffs into the lungs every 6 (six) hours as needed for wheezing or shortness of breath. 6.7 g 0  . docusate sodium (COLACE) 100 MG capsule Take 1 capsule (100 mg total) by mouth 2 (two) times daily as needed for mild constipation. 60 capsule 2  . doxycycline (VIBRAMYCIN) 100 MG capsule Take 1 capsule (100 mg total) by mouth 2 (two) times daily. 20 capsule 0  . ferrous sulfate 325 (65 FE) MG tablet Take 1 tablet (325 mg total) by mouth daily with breakfast. 30 tablet 3  . Multiple Vitamins-Minerals (MULTIVITAMIN ADULT) CHEW Chew 2 each by mouth daily. Vitafusion gummy    . ondansetron (ZOFRAN-ODT) 4 MG disintegrating tablet Take 1 tablet (4 mg total) by mouth every 6  (six) hours as needed for nausea. 20 tablet 0  . oxyCODONE (OXY IR/ROXICODONE) 5 MG immediate release tablet Take 1 tablet (5 mg total) by mouth every 4 (four) hours as needed for severe pain or breakthrough pain. 30 tablet 0  . OXYGEN Inhale 4 L into the lungs daily.    . roflumilast (DALIRESP) 500 MCG TABS tablet Take 0.5 tablets (250 mcg total) by mouth daily. 30 tablet 12   No current facility-administered medications on file prior to visit.     PLAN -Will contact next week.   [place] [facility] [phone]

## 2019-09-30 ENCOUNTER — Other Ambulatory Visit: Payer: Self-pay

## 2019-09-30 ENCOUNTER — Ambulatory Visit (INDEPENDENT_AMBULATORY_CARE_PROVIDER_SITE_OTHER): Payer: Self-pay | Admitting: General Surgery

## 2019-09-30 ENCOUNTER — Encounter: Payer: Self-pay | Admitting: General Surgery

## 2019-09-30 VITALS — BP 106/68 | HR 85 | Temp 97.3°F | Resp 18 | Ht 64.0 in | Wt 96.0 lb

## 2019-09-30 DIAGNOSIS — Z09 Encounter for follow-up examination after completed treatment for conditions other than malignant neoplasm: Secondary | ICD-10-CM

## 2019-09-30 LAB — SURGICAL PATHOLOGY

## 2019-10-01 NOTE — Progress Notes (Signed)
Subjective:     Katelyn [name]  Here for postoperative wound check.  Patient is doing well, status post partial small bowel resection.  Bowel movements are regular.  Denies any nausea or vomiting. Objective:    BP 106/68 (BP Location: Left Arm, Patient Position: Sitting, Cuff Size: Normal)   Pulse 85   Temp (!) 97.3 F (36.3 C) (Tympanic)   Resp 18   Ht 5\' 4"  (1.626 m)   Wt 96 lb (43.5 kg)   SpO2 95%   BMI 16.48 kg/m   General:  alert, cooperative and no distress  Abdomen is soft, nontender, nondistended.  Midline incision has healed well.  Staples removed, Steri-Strips applied.     Assessment:    Doing well postoperatively.    Plan:   May increase activity as able.  Patient is scheduled to follow-up with Dr. Constance Haw next week.

## 2019-10-03 ENCOUNTER — Emergency Department (HOSPITAL_COMMUNITY)
Admission: EM | Admit: 2019-10-03 | Discharge: 2019-10-04 | Disposition: A | Discharge status: Home / Self Care | Payer: Medicare Other | Attending: Emergency Medicine | Admitting: Emergency Medicine

## 2019-10-03 ENCOUNTER — Other Ambulatory Visit: Payer: Self-pay

## 2019-10-03 ENCOUNTER — Encounter (HOSPITAL_COMMUNITY): Payer: Self-pay | Admitting: Emergency Medicine

## 2019-10-03 DIAGNOSIS — J441 Chronic obstructive pulmonary disease with (acute) exacerbation: Secondary | ICD-10-CM | POA: Insufficient documentation

## 2019-10-03 DIAGNOSIS — I10 Essential (primary) hypertension: Secondary | ICD-10-CM | POA: Diagnosis not present

## 2019-10-03 DIAGNOSIS — R0602 Shortness of breath: Secondary | ICD-10-CM | POA: Diagnosis present

## 2019-10-03 DIAGNOSIS — I444 Left anterior fascicular block: Secondary | ICD-10-CM | POA: Diagnosis not present

## 2019-10-03 DIAGNOSIS — J4541 Moderate persistent asthma with (acute) exacerbation: Secondary | ICD-10-CM | POA: Diagnosis not present

## 2019-10-03 DIAGNOSIS — Z87891 Personal history of nicotine dependence: Secondary | ICD-10-CM | POA: Insufficient documentation

## 2019-10-03 DIAGNOSIS — Z20828 Contact with and (suspected) exposure to other viral communicable diseases: Secondary | ICD-10-CM | POA: Insufficient documentation

## 2019-10-03 DIAGNOSIS — Z79899 Other long term (current) drug therapy: Secondary | ICD-10-CM | POA: Diagnosis not present

## 2019-10-03 MED ORDER — METHYLPREDNISOLONE SODIUM SUCC 125 MG IJ SOLR
125.0000 mg | Freq: Once | INTRAMUSCULAR | Status: AC
  Administered 2019-10-04: 125 mg via INTRAVENOUS
  Filled 2019-10-03: qty 2

## 2019-10-03 MED ORDER — MAGNESIUM SULFATE 2 GM/50ML IV SOLN
2.0000 g | Freq: Once | INTRAVENOUS | Status: AC
  Administered 2019-10-03: 2 g via INTRAVENOUS
  Filled 2019-10-03: qty 50

## 2019-10-03 MED ORDER — ALBUTEROL SULFATE HFA 108 (90 BASE) MCG/ACT IN AERS
8.0000 | INHALATION_SPRAY | Freq: Once | RESPIRATORY_TRACT | Status: AC
  Administered 2019-10-03: 8 via RESPIRATORY_TRACT
  Filled 2019-10-03: qty 6.7

## 2019-10-03 NOTE — ED Provider Notes (Signed)
[facility] EMERGENCY DEPARTMENT Provider Note   CSN: [id number] Arrival date & time: 10/03/19  2218   Time seen 11:04 PM  History   Chief Complaint Chief Complaint  Patient presents with  . Shortness of Breath    HPI Katelyn [name] is a 78 y.o. female.     HPI patient has COPD and is on home oxygen.  She cannot tell me how many liters that she uses.  She was recently in the hospital from September 25 through September 29 with small bowel obstruction and small bowel ischemia requiring resection of about 20 cm done by Dr. Constance Haw.  She was seen in follow-up on October 8 with Dr. Arnoldo Morale and she states "he took everything off me".  She cannot tell me what that means.  She does make it clear however none of her medications were changed.  She states today she started having trouble breathing.  She states she last used her inhaler just prior to coming to the ED and she last used her nebulizer 2 to 3 hours prior to coming to the ED.  She denies any cough or mucus production.  She denies any fever.  PCP Sinda Du, MD   Past Medical History:  Diagnosis Date  . Asthma   . Chronic neck pain   . Chronic pain of left elbow   . Complication of anesthesia    hard to wake  . COPD (chronic obstructive pulmonary disease) ([place])   . Dysphagia    requiring multiple dilations in past  . Headache    Patient states she needs  eyeglasses  . Irregular heart beat   . S/P colonoscopy 2009   dr. Gala Romney: anal papilla, 2 cecal AVMs. Due in 2014  . S/P endoscopy 2012   Dr. Gala Romney: mild chronic gastritis, bulbar erosions, s/p 54-French Maloney dilation   . S/P endoscopy 2003, 2004, 2007   gastritis, duodenitis, s/p dilation, +H.pylori     Patient Active Problem List   Diagnosis Date Noted  . Small bowel ischemia ([place]) 09/17/2019  . 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 Small bowel obstruction ([place])   . Acute on chronic respiratory failure with hypoxia ([place]) 03/17/2019  . IDA (iron  deficiency anemia) 07/04/2018  . Acute metabolic encephalopathy [id number]  . Acute respiratory failure with hypoxia and hypercapnia ([place]) 07/02/2018  . GERD (gastroesophageal reflux disease) 06/13/2018  . Malnutrition ([place]) 06/13/2018  . Essential hypertension 06/13/2018  . Tachycardia 04/14/2018  . Acute gastritis 03/31/2018  . Esophagitis 03/31/2018  . Duodenal ulcer 03/31/2018  . Goals of care, counseling/discussion   . DNR (do not resuscitate) discussion   . Palliative care by specialist   . COPD with acute exacerbation ([place]) 03/29/2018  . COPD (chronic obstructive pulmonary disease) ([place]) 02/03/2018  . Hypoxia 11/28/2015  . COPD exacerbation ([place]) 11/28/2015  . Acute respiratory failure with hypoxia ([place]) 11/28/2015  . SOB (shortness of breath) 11/28/2015  . Mucosal abnormality of stomach   . History of colonic polyps 04/25/2015  . 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 Dysphagia 03/11/2011    Past Surgical History:  Procedure Laterality Date  . APPENDECTOMY    . BIOPSY  03/30/2018   Procedure: BIOPSY;  Surgeon: Daneil Dolin, MD;  Location: [facility] ENDO SUITE;  Service: Gastroenterology;;  gastric  . BOWEL RESECTION N/A 09/17/2019   Procedure: small bowel resection;  Surgeon: Virl Cagey, MD;  Location: [facility] ORS;  Service: General;  Laterality: N/A;  . CHOLECYSTECTOMY    . COLONOSCOPY  10/10/2008   RMR: 1. Anal papilla, otherwise normal rectum. 2. Two cecal arteriovenous malformations, colonic mucosa apperaed normal.   . COLONOSCOPY N/A 05/10/2015   RMR: Rectal and colonic polyps removed as described above. Ascending colon AVMs . Rectal polyps were tubular adenomas. Next colonoscopy May 2021.  . ESOPHAGEAL DILATION N/A 05/10/2015   Procedure: ESOPHAGEAL DILATION;  Surgeon: Daneil Dolin, MD;  Location: [facility] END[facility];  Service: Endoscopy;  Laterality: N/A;  . ESOPHAGOGASTRODUODENOSCOPY  04/02/2011   RMR: 1. Endoscopically normal -appearing esophagus status post passage of a  Maloney dilator followed by biopsy. 2. Hiatal hernia, Mottling, and submucosal petechiae in teh gastric mucoas of uncertain significance status post biopsy. 3. Bulbar erosions as described above.   . ESOPHAGOGASTRODUODENOSCOPY N/A 05/10/2015   RMR: Normal esophagus status post Maloney dilation. Small hiatal hernia. Abnormal gastric because of doubtful clinical significance status post biopsy (benign)  . ESOPHAGOGASTRODUODENOSCOPY (EGD) WITH PROPOFOL N/A 03/30/2018   Procedure: ESOPHAGOGASTRODUODENOSCOPY (EGD) WITH PROPOFOL;  Surgeon: Daneil Dolin, MD;  Location: [facility];  Service: Gastroenterology;  Laterality: N/A;  WITH DILATION   . INGUINAL HERNIA REPAIR    . MALONEY DILATION  03/30/2018   Procedure: Venia Minks DILATION;  Surgeon: Daneil Dolin, MD;  Location: [facility];  Service: Gastroenterology;;  . TONSILLECTOMY    . TOTAL ABDOMINAL HYSTERECTOMY       OB History    Gravida  5   Para  5   Term  4   Preterm  1   AB      Living  4     SAB      TAB      Ectopic      Multiple      Live Births               Home Medications    Prior to Admission medications   Medication Sig Start Date End Date Taking? Authorizing Provider  albuterol (PROVENTIL) (2.5 MG/3ML) 0.083% nebulizer solution Take 2.5 mg by nebulization every 4 (four) hours as needed for shortness of breath.    [provider]  albuterol (VENTOLIN HFA) 108 (90 Base) MCG/ACT inhaler Inhale 2 puffs into the lungs every 6 (six) hours as needed for wheezing or shortness of breath. 09/08/19   Rancour, Annie Main, MD  docusate sodium (COLACE) 100 MG capsule Take 1 capsule (100 mg total) by mouth 2 (two) times daily as needed for mild constipation. 09/21/19 09/20/20  Virl Cagey, MD  doxycycline (VIBRAMYCIN) 100 MG capsule Take 1 capsule (100 mg total) by mouth 2 (two) times daily. 09/08/19   Rancour, Annie Main, MD  ferrous sulfate 325 (65 FE) MG tablet Take 1 tablet (325 mg total) by mouth daily with  breakfast. 03/19/19   Sinda Du, MD  Multiple Vitamins-Minerals (MULTIVITAMIN ADULT) CHEW Chew 2 each by mouth daily. Vitafusion gummy    [provider]  ondansetron (ZOFRAN-ODT) 4 MG disintegrating tablet Take 1 tablet (4 mg total) by mouth every 6 (six) hours as needed for nausea. 09/21/19   Virl Cagey, MD  oxyCODONE (OXY IR/ROXICODONE) 5 MG immediate release tablet Take 1 tablet (5 mg total) by mouth every 4 (four) hours as needed for severe pain or breakthrough pain. 09/21/19   Virl Cagey, MD  OXYGEN Inhale 4 L into the lungs daily.    [provider]  roflumilast (DALIRESP) 500 MCG TABS tablet Take 0.5 tablets (250 mcg total) by mouth daily. 07/05/18   Sinda Du,  MD    Family History Family History  Problem Relation Age of Onset  . Heart disease Mother   . Lung cancer Father   . Cancer Sister   . Diabetes Brother   . Colon cancer Neg Hx     Social History Social History   Tobacco Use  . Smoking status: Former Smoker    Packs/day: 1.00    Years: 57.00    Pack years: 57.00    Types: Cigarettes    Start date: 08/18/1959    Quit date: 08/14/2016    Years since quitting: 3.1  . Smokeless tobacco: Never Used  Substance Use Topics  . Alcohol use: No    Alcohol/week: 0.0 standard drinks  . Drug use: No  On home oxygen   Allergies   Sevoflurane   Review of Systems Review of Systems  All other systems reviewed and are negative.    Physical Exam Updated Vital Signs BP 106/60   Pulse 88   Temp 98.9 F (37.2 C) (Oral)   Resp 17   SpO2 100%   Physical Exam Vitals signs and nursing note reviewed.  Constitutional:      General: She is not in acute distress.    Appearance: Normal appearance. She is well-developed. She is not ill-appearing or toxic-appearing.  HENT:     Head: Normocephalic and atraumatic.     Right Ear: External ear normal.     Left Ear: External ear normal.     Nose: Nose normal. No mucosal edema or  rhinorrhea.     Mouth/Throat:     Dentition: No dental abscesses.     Pharynx: No uvula swelling.  Eyes:     Extraocular Movements: Extraocular movements intact.     Conjunctiva/sclera: Conjunctivae normal.     Pupils: Pupils are equal, round, and reactive to light.  Neck:     Musculoskeletal: Full passive range of motion without pain, normal range of motion and neck supple.  Cardiovascular:     Rate and Rhythm: Normal rate and regular rhythm.     Heart sounds: Normal heart sounds. No murmur. No friction rub. No gallop.   Pulmonary:     Effort: Tachypnea and accessory muscle usage present. No respiratory distress.     Breath sounds: Decreased air movement present. Decreased breath sounds and wheezing present. No rhonchi or rales.     Comments: Wheezing is sometimes audible Chest:     Chest wall: No tenderness or crepitus.  Abdominal:     General: Bowel sounds are normal. There is no distension.     Palpations: Abdomen is soft.     Tenderness: There is no abdominal tenderness. There is no guarding or rebound.     Comments: Patient has a well-healing incision that is below the umbilicus in the midline with Steri-Strips in place.  Musculoskeletal: Normal range of motion.        General: No tenderness.     Comments: Moves all extremities well.   Skin:    General: Skin is warm and dry.     Coloration: Skin is not pale.     Findings: No erythema or rash.  Neurological:     Mental Status: She is alert and oriented to person, place, and time.     Cranial Nerves: No cranial nerve deficit.  Psychiatric:        Mood and Affect: Mood is not anxious.        Speech: Speech normal.  Behavior: Behavior normal.      ED Treatments / Results  Labs (all labs ordered are listed, but only abnormal results are displayed) Results for orders placed or performed during the hospital encounter of 10/03/19  SARS Coronavirus 2 by RT PCR (hospital order, performed in [facility] hospital lab)  Nasopharyngeal Nasopharyngeal Swab   Specimen: Nasopharyngeal Swab  Result Value Ref Range   SARS Coronavirus 2 NEGATIVE NEGATIVE   Laboratory interpretation all normal     EKG EKG Interpretation  Date/Time:  Sunday October 03 2019 23:15:17 EDT Ventricular Rate:  77 PR Interval:    QRS Duration: 87 QT Interval:  368 QTC Calculation: 417 R Axis:   -59 Text Interpretation:  Sinus rhythm LAD, consider left anterior fascicular block Baseline wander No significant change since last tracing 17 Sep 2019 Confirmed by Rolland Porter [phone]) on 10/04/2019 12:17:49 AM   Radiology No results found.  Procedures Procedures (including critical care time)  Medications Ordered in ED Medications  albuterol (VENTOLIN HFA) 108 (90 Base) MCG/ACT inhaler 8 puff (8 puffs Inhalation Given 10/03/19 2354)  magnesium sulfate IVPB 2 g 50 mL (0 g Intravenous Stopped 10/04/19 0130)  methylPREDNISolone sodium succinate (SOLU-MEDROL) 125 mg/2 mL injection 125 mg (125 mg Intravenous Given 10/04/19 0006)  albuterol (PROVENTIL,VENTOLIN) solution continuous neb (10 mg/hr Nebulization Given 10/04/19 0042)     Initial Impression / Assessment and Plan / ED Course  I have reviewed the triage vital signs and the nursing notes.  Pertinent labs & imaging results that were available during my care of the patient were reviewed by me and considered in my medical decision making (see chart for details).       Reviewing her prior labs show she has had normal kidney function, she was given albuterol inhaler 8 puffs, IV magnesium and IV Solu-Medrol.  COVID testing was done so we can do a nebulizer treatment.  Recheck at 12:20 AM patient's COVID test is negative.  She appears to be more comfortable, I no longer hear audible wheezing and her tachypnea is improved.  When I listen to her she has few scattered rhonchi.  She was given a continuous nebulizer 10 mg albuterol.  She still cannot tell me what it was that Dr. Arnoldo Morale  changed, because when she came in she states "he took me off everything".  Recheck at 1:40 AM patient is sleeping in no distress.  Her continuous nebulizer is still almost full.  Recheck at 2:12 AM patient is awaken, she states her breathing is better.  I listen to her she has rare and late expiratory rhonchi in the bases.  She has of small amount of fluid left in her continuous nebulizer.  I think she will be able to be discharged once that is finished.  She continues to say she started feeling bad once Dr. Arnoldo Morale "took everything off me".  It sounds like she had some type of binders that she felt like she needed.  I did not discharge her home with steroids because of her recent surgery I did not want her to have delayed healing.  Final Clinical Impressions(s) / ED Diagnoses   Final diagnoses:  COPD exacerbation ([place])  Moderate persistent asthma with exacerbation    ED Discharge Orders    None     Plan discharge  Rolland Porter, MD, Barbette Or, MD 10/04/19 [phone]

## 2019-10-03 NOTE — ED Triage Notes (Signed)
Pt C/O SOB. Pt has audible wheezing bilaterally. Pt in tripod position in triage. O2 96% on RA.

## 2019-10-04 DIAGNOSIS — J441 Chronic obstructive pulmonary disease with (acute) exacerbation: Secondary | ICD-10-CM | POA: Diagnosis not present

## 2019-10-04 LAB — SARS CORONAVIRUS 2 BY RT PCR (HOSPITAL ORDER, PERFORMED IN ~~LOC~~ HOSPITAL LAB): SARS Coronavirus 2: NEGATIVE

## 2019-10-04 MED ORDER — ALBUTEROL (5 MG/ML) CONTINUOUS INHALATION SOLN
10.0000 mg/h | INHALATION_SOLUTION | Freq: Once | RESPIRATORY_TRACT | Status: AC
  Administered 2019-10-04: 10 mg/h via RESPIRATORY_TRACT
  Filled 2019-10-04: qty 20

## 2019-10-04 NOTE — ED Notes (Signed)
resp paged

## 2019-10-04 NOTE — Discharge Instructions (Addendum)
You can use your inhaler and your nebulizer more if you are having wheezing and shortness of breath.  I did not put you on the prednisone or steroids because of your recent surgery and I do not want to delay your healing process.

## 2019-10-04 NOTE — ED Notes (Signed)
Pt resting comfortably, NAD noted at this time

## 2019-10-06 DIAGNOSIS — J449 Chronic obstructive pulmonary disease, unspecified: Secondary | ICD-10-CM | POA: Diagnosis not present

## 2019-10-06 DIAGNOSIS — J9611 Chronic respiratory failure with hypoxia: Secondary | ICD-10-CM | POA: Diagnosis not present

## 2019-10-06 DIAGNOSIS — E46 Unspecified protein-calorie malnutrition: Secondary | ICD-10-CM | POA: Diagnosis not present

## 2019-10-06 DIAGNOSIS — D500 Iron deficiency anemia secondary to blood loss (chronic): Secondary | ICD-10-CM | POA: Diagnosis not present

## 2019-10-12 ENCOUNTER — Encounter: Payer: Self-pay | Admitting: General Surgery

## 2019-10-12 ENCOUNTER — Other Ambulatory Visit: Payer: Self-pay

## 2019-10-12 ENCOUNTER — Ambulatory Visit (INDEPENDENT_AMBULATORY_CARE_PROVIDER_SITE_OTHER): Payer: Self-pay | Admitting: General Surgery

## 2019-10-12 VITALS — BP 101/61 | HR 72 | Temp 96.2°F | Resp 16 | Ht 64.0 in | Wt 95.0 lb

## 2019-10-12 DIAGNOSIS — K559 Vascular disorder of intestine, unspecified: Secondary | ICD-10-CM

## 2019-10-12 NOTE — Progress Notes (Signed)
[facility] Note   HPI:  78 y.o. Female presents to clinic for post-op follow-up evaluation after a SBR. She saw Dr. Arnoldo Morale and had her stables removed post op. And has been doing well. No complaints.   Review of Systems:  No fever or chills Healing Improved pain Having BMs regularly Diet going well All other review of systems: otherwise negative   Vital Signs:  BP 101/61 (BP Location: Left Arm, Patient Position: Sitting, Cuff Size: Normal)   Pulse 72   Temp (!) 96.2 F (35.7 C) (Tympanic)   Resp 16   Ht 5\' 4"  (1.626 m)   Wt 95 lb (43.1 kg)   SpO2 95%   BMI 16.31 kg/m    Physical Exam:  Physical Exam Vitals signs reviewed.  HENT:     Head: Normocephalic.  Cardiovascular:     Rate and Rhythm: Normal rate.  Pulmonary:     Effort: Pulmonary effort is normal.  Abdominal:     General: There is no distension.     Palpations: Abdomen is soft.     Tenderness: There is no abdominal tenderness.     Hernia: No hernia is present.     Comments: Healing midline with no erythema or drainage, no hernia   Musculoskeletal: Normal range of motion.        General: No swelling.  Neurological:     Mental Status: She is alert.    Pathology:  A. SMALL BOWEL, RESCTION:  - Benign small bowel mucosa with ischemic change and submucosal  hemorrhage.  - Ischemic changes involve one of the margins.  - No dysplasia or malignancy.   Assessment:  78 y.o. yo Female s/p Ex lap, SBR for ischemic bowel related to internal hernia and SBO. Doing well overall. Had a COPD exacerbation and visit to ED, but otherwise well.   Plan:  - Diet as tolerated   - No heavy lifting > 10 lbs, no excessive bending, pushing, pulling for 6-8 weeks post op. - PRN follow up  All of the above recommendations were discussed with the patient, and all of patient's questions were answered to her expressed satisfaction.  Curlene Labrum, MD [facility] [address] [place], [place] [address] [phone] (office)

## 2019-10-12 NOTE — Patient Instructions (Signed)
No heavy lifting > 10 lbs, no excessive bending, pushing, pulling for 6-8 weeks post op. Diet as tolerated.

## 2019-10-20 ENCOUNTER — Other Ambulatory Visit: Payer: Self-pay

## 2019-10-20 ENCOUNTER — Encounter (HOSPITAL_COMMUNITY): Payer: Self-pay | Admitting: Emergency Medicine

## 2019-10-20 ENCOUNTER — Emergency Department (HOSPITAL_COMMUNITY): Payer: Medicare Other

## 2019-10-20 ENCOUNTER — Emergency Department (HOSPITAL_COMMUNITY)
Admission: EM | Admit: 2019-10-20 | Discharge: 2019-10-20 | Disposition: A | Discharge status: Home / Self Care | Payer: Medicare Other | Attending: Emergency Medicine | Admitting: Emergency Medicine

## 2019-10-20 DIAGNOSIS — J441 Chronic obstructive pulmonary disease with (acute) exacerbation: Secondary | ICD-10-CM | POA: Diagnosis not present

## 2019-10-20 DIAGNOSIS — Z20828 Contact with and (suspected) exposure to other viral communicable diseases: Secondary | ICD-10-CM | POA: Diagnosis not present

## 2019-10-20 DIAGNOSIS — Z87891 Personal history of nicotine dependence: Secondary | ICD-10-CM | POA: Diagnosis not present

## 2019-10-20 DIAGNOSIS — Z79899 Other long term (current) drug therapy: Secondary | ICD-10-CM | POA: Insufficient documentation

## 2019-10-20 DIAGNOSIS — I10 Essential (primary) hypertension: Secondary | ICD-10-CM | POA: Insufficient documentation

## 2019-10-20 DIAGNOSIS — Z9981 Dependence on supplemental oxygen: Secondary | ICD-10-CM | POA: Insufficient documentation

## 2019-10-20 DIAGNOSIS — R0602 Shortness of breath: Secondary | ICD-10-CM | POA: Diagnosis not present

## 2019-10-20 LAB — CBC WITH DIFFERENTIAL/PLATELET
Abs Immature Granulocytes: 0.03 10*3/uL (ref 0.00–0.07)
Basophils Absolute: 0.1 10*3/uL (ref 0.0–0.1)
Basophils Relative: 1 %
Eosinophils Absolute: 1.8 10*3/uL — ABNORMAL HIGH (ref 0.0–0.5)
Eosinophils Relative: 19 %
HCT: 37.9 % (ref 36.0–46.0)
Hemoglobin: 11.7 g/dL — ABNORMAL LOW (ref 12.0–15.0)
Immature Granulocytes: 0 %
Lymphocytes Relative: 39 %
Lymphs Abs: 3.8 10*3/uL (ref 0.7–4.0)
MCH: 28.6 pg (ref 26.0–34.0)
MCHC: 30.9 g/dL (ref 30.0–36.0)
MCV: 92.7 fL (ref 80.0–100.0)
Monocytes Absolute: 1.1 10*3/uL — ABNORMAL HIGH (ref 0.1–1.0)
Monocytes Relative: 12 %
Neutro Abs: 2.7 10*3/uL (ref 1.7–7.7)
Neutrophils Relative %: 29 %
Platelets: 284 10*3/uL (ref 150–400)
RBC: 4.09 MIL/uL (ref 3.87–5.11)
RDW: 14.6 % (ref 11.5–15.5)
WBC: 9.4 10*3/uL (ref 4.0–10.5)
nRBC: 0 % (ref 0.0–0.2)

## 2019-10-20 LAB — BASIC METABOLIC PANEL
Anion gap: 8 (ref 5–15)
BUN: 16 mg/dL (ref 8–23)
CO2: 25 mmol/L (ref 22–32)
Calcium: 9 mg/dL (ref 8.9–10.3)
Chloride: 107 mmol/L (ref 98–111)
Creatinine, Ser: 0.77 mg/dL (ref 0.44–1.00)
GFR calc Af Amer: >60 mL/min (ref >60)
GFR calc non Af Amer: >60 mL/min (ref >60)
Glucose, Bld: 114 mg/dL — ABNORMAL HIGH (ref 70–99)
Potassium: 4.3 mmol/L (ref 3.5–5.1)
Sodium: 140 mmol/L (ref 135–145)

## 2019-10-20 LAB — SARS CORONAVIRUS 2 BY RT PCR (HOSPITAL ORDER, PERFORMED IN ~~LOC~~ HOSPITAL LAB): SARS Coronavirus 2: NEGATIVE

## 2019-10-20 MED ORDER — IPRATROPIUM BROMIDE HFA 17 MCG/ACT IN AERS
2.0000 | INHALATION_SPRAY | Freq: Once | RESPIRATORY_TRACT | Status: DC
  Filled 2019-10-20: qty 12.9

## 2019-10-20 MED ORDER — MAGNESIUM SULFATE 2 GM/50ML IV SOLN
2.0000 g | Freq: Once | INTRAVENOUS | Status: AC
  Administered 2019-10-20: 03:00:00 2 g via INTRAVENOUS
  Filled 2019-10-20: qty 50

## 2019-10-20 MED ORDER — IPRATROPIUM BROMIDE 0.02 % IN SOLN
0.5000 mg | Freq: Once | RESPIRATORY_TRACT | Status: AC
  Administered 2019-10-20: 0.5 mg via RESPIRATORY_TRACT
  Filled 2019-10-20: qty 2.5

## 2019-10-20 MED ORDER — ALBUTEROL SULFATE HFA 108 (90 BASE) MCG/ACT IN AERS
6.0000 | INHALATION_SPRAY | Freq: Once | RESPIRATORY_TRACT | Status: AC
  Administered 2019-10-20: 03:00:00 6 via RESPIRATORY_TRACT

## 2019-10-20 MED ORDER — DEXAMETHASONE SODIUM PHOSPHATE 10 MG/ML IJ SOLN
10.0000 mg | Freq: Once | INTRAMUSCULAR | Status: AC
  Administered 2019-10-20: 10 mg via INTRAVENOUS
  Filled 2019-10-20: qty 1

## 2019-10-20 MED ORDER — METHYLPREDNISOLONE SODIUM SUCC 125 MG IJ SOLR
INTRAMUSCULAR | Status: AC
  Filled 2019-10-20: qty 2

## 2019-10-20 MED ORDER — ALBUTEROL SULFATE HFA 108 (90 BASE) MCG/ACT IN AERS
6.0000 | INHALATION_SPRAY | Freq: Once | RESPIRATORY_TRACT | Status: AC
  Administered 2019-10-20: 6 via RESPIRATORY_TRACT

## 2019-10-20 MED ORDER — IPRATROPIUM BROMIDE HFA 17 MCG/ACT IN AERS: 2.0000 | INHALATION_SPRAY | Freq: Once | RESPIRATORY_TRACT | Status: DC

## 2019-10-20 MED ORDER — ALBUTEROL (5 MG/ML) CONTINUOUS INHALATION SOLN
15.0000 mg/h | INHALATION_SOLUTION | Freq: Once | RESPIRATORY_TRACT | Status: AC
  Administered 2019-10-20: 05:00:00 15 mg/h via RESPIRATORY_TRACT
  Filled 2019-10-20: qty 20

## 2019-10-20 MED ORDER — ALBUTEROL SULFATE HFA 108 (90 BASE) MCG/ACT IN AERS
INHALATION_SPRAY | RESPIRATORY_TRACT | Status: AC
  Administered 2019-10-20: 6 via RESPIRATORY_TRACT
  Filled 2019-10-20: qty 6.7

## 2019-10-20 MED ORDER — IPRATROPIUM BROMIDE 0.02 % IN SOLN: 0.5000 mg | Freq: Once | RESPIRATORY_TRACT | Status: DC

## 2019-10-20 MED ORDER — PREDNISONE 20 MG PO TABS: 40.0000 mg | ORAL_TABLET | Freq: Every day | ORAL | 0 refills | Status: DC

## 2019-10-20 NOTE — ED Triage Notes (Signed)
Pt c/o SOB. Pt currently has audible wheezing during triage. O2 100% on 4L [place].

## 2019-10-20 NOTE — ED Provider Notes (Signed)
[facility] EMERGENCY DEPARTMENT Provider Note   CSN: [id number] Arrival date & time: 10/20/19  0247     History   Chief Complaint Chief Complaint  Patient presents with  . Shortness of Breath    HPI Katelyn [name] is a 78 y.o. female.     HPI  This is a 78 year old female well-known to our emergency department with a history of COPD on home oxygen, hypertension who presents with shortness of breath.  Patient reports she had acute onset of shortness of breath yesterday evening.  He got progressively worse at home.  She turned up her oxygen and used her inhalers with no relief.  She denies any recent cough or fever.  She denies any Covid exposures or sick contacts.  She denies chest pain, nausea, vomiting, abdominal pain.  She is not noted any lower extremity swelling.  Past Medical History:  Diagnosis Date  . Asthma   . Chronic neck pain   . Chronic pain of left elbow   . Complication of anesthesia    hard to wake  . COPD (chronic obstructive pulmonary disease) ([place])   . Dysphagia    requiring multiple dilations in past  . Headache    Patient states she needs  eyeglasses  . Irregular heart beat   . S/P colonoscopy 2009   dr. Gala Romney: anal papilla, 2 cecal AVMs. Due in 2014  . S/P endoscopy 2012   Dr. Gala Romney: mild chronic gastritis, bulbar erosions, s/p 54-French Maloney dilation   . S/P endoscopy 2003, 2004, 2007   gastritis, duodenitis, s/p dilation, +H.pylori     Patient Active Problem List   Diagnosis Date Noted  . Small bowel ischemia ([place]) 09/17/2019  . 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 Small bowel obstruction ([place])   . Acute on chronic respiratory failure with hypoxia ([place]) 03/17/2019  . IDA (iron deficiency anemia) 07/04/2018  . Acute metabolic encephalopathy [id number]  . Acute respiratory failure with hypoxia and hypercapnia ([place]) 07/02/2018  . GERD (gastroesophageal reflux disease) 06/13/2018  . Malnutrition ([place]) 06/13/2018  . Essential  hypertension 06/13/2018  . Tachycardia 04/14/2018  . Acute gastritis 03/31/2018  . Esophagitis 03/31/2018  . Duodenal ulcer 03/31/2018  . Goals of care, counseling/discussion   . DNR (do not resuscitate) discussion   . Palliative care by specialist   . COPD with acute exacerbation ([place]) 03/29/2018  . COPD (chronic obstructive pulmonary disease) ([place]) 02/03/2018  . Hypoxia 11/28/2015  . COPD exacerbation ([place]) 11/28/2015  . Acute respiratory failure with hypoxia ([place]) 11/28/2015  . SOB (shortness of breath) 11/28/2015  . Mucosal abnormality of stomach   . History of colonic polyps 04/25/2015  . 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 Dysphagia 03/11/2011    Past Surgical History:  Procedure Laterality Date  . APPENDECTOMY    . BIOPSY  03/30/2018   Procedure: BIOPSY;  Surgeon: Daneil Dolin, MD;  Location: [facility];  Service: Gastroenterology;;  gastric  . BOWEL RESECTION N/A 09/17/2019   Procedure: small bowel resection;  Surgeon: Virl Cagey, MD;  Location: [facility] ORS;  Service: General;  Laterality: N/A;  . CHOLECYSTECTOMY    . COLONOSCOPY  10/10/2008   RMR: 1. Anal papilla, otherwise normal rectum. 2. Two cecal arteriovenous malformations, colonic mucosa apperaed normal.   . COLONOSCOPY N/A 05/10/2015   RMR: Rectal and colonic polyps removed as described above. Ascending colon AVMs . Rectal polyps were tubular adenomas. Next colonoscopy May 2021.  . ESOPHAGEAL DILATION N/A 05/10/2015   Procedure:  ESOPHAGEAL DILATION;  Surgeon: Daneil Dolin, MD;  Location: [facility];  Service: Endoscopy;  Laterality: N/A;  . ESOPHAGOGASTRODUODENOSCOPY  04/02/2011   RMR: 1. Endoscopically normal -appearing esophagus status post passage of a Maloney dilator followed by biopsy. 2. Hiatal hernia, Mottling, and submucosal petechiae in teh gastric mucoas of uncertain significance status post biopsy. 3. Bulbar erosions as described above.   . ESOPHAGOGASTRODUODENOSCOPY N/A 05/10/2015   RMR: Normal  esophagus status post Maloney dilation. Small hiatal hernia. Abnormal gastric because of doubtful clinical significance status post biopsy (benign)  . ESOPHAGOGASTRODUODENOSCOPY (EGD) WITH PROPOFOL N/A 03/30/2018   Procedure: ESOPHAGOGASTRODUODENOSCOPY (EGD) WITH PROPOFOL;  Surgeon: Daneil Dolin, MD;  Location: [facility];  Service: Gastroenterology;  Laterality: N/A;  WITH DILATION   . INGUINAL HERNIA REPAIR    . MALONEY DILATION  03/30/2018   Procedure: Venia Minks DILATION;  Surgeon: Daneil Dolin, MD;  Location: [facility];  Service: Gastroenterology;;  . TONSILLECTOMY    . TOTAL ABDOMINAL HYSTERECTOMY       OB History    Gravida  5   Para  5   Term  4   Preterm  1   AB      Living  4     SAB      TAB      Ectopic      Multiple      Live Births               Home Medications    Prior to Admission medications   Medication Sig Start Date End Date Taking? Authorizing Provider  albuterol (PROVENTIL) (2.5 MG/3ML) 0.083% nebulizer solution Take 2.5 mg by nebulization every 4 (four) hours as needed for shortness of breath.    [provider]  albuterol (VENTOLIN HFA) 108 (90 Base) MCG/ACT inhaler Inhale 2 puffs into the lungs every 6 (six) hours as needed for wheezing or shortness of breath. 09/08/19   Rancour, Annie Main, MD  docusate sodium (COLACE) 100 MG capsule Take 1 capsule (100 mg total) by mouth 2 (two) times daily as needed for mild constipation. 09/21/19 09/20/20  Virl Cagey, MD  doxycycline (VIBRAMYCIN) 100 MG capsule Take 1 capsule (100 mg total) by mouth 2 (two) times daily. 09/08/19   Rancour, Annie Main, MD  ferrous sulfate 325 (65 FE) MG tablet Take 1 tablet (325 mg total) by mouth daily with breakfast. 03/19/19   Sinda Du, MD  Multiple Vitamins-Minerals (MULTIVITAMIN ADULT) CHEW Chew 2 each by mouth daily. Vitafusion gummy    [provider]  ondansetron (ZOFRAN-ODT) 4 MG disintegrating tablet Take 1 tablet (4 mg total) by  mouth every 6 (six) hours as needed for nausea. 09/21/19   Virl Cagey, MD  oxyCODONE (OXY IR/ROXICODONE) 5 MG immediate release tablet Take 1 tablet (5 mg total) by mouth every 4 (four) hours as needed for severe pain or breakthrough pain. 09/21/19   Virl Cagey, MD  OXYGEN Inhale 4 L into the lungs daily.    [provider]  predniSONE (DELTASONE) 20 MG tablet Take 2 tablets (40 mg total) by mouth daily. 10/20/19   Soloman Mckeithan, Barbette Hair, MD  roflumilast (DALIRESP) 500 MCG TABS tablet Take 0.5 tablets (250 mcg total) by mouth daily. 07/05/18   Sinda Du, MD    Family History Family History  Problem Relation Age of Onset  . Heart disease Mother   . Lung cancer Father   . Cancer Sister   . Diabetes Brother   .  Colon cancer Neg Hx     Social History Social History   Tobacco Use  . Smoking status: Former Smoker    Packs/day: 1.00    Years: 57.00    Pack years: 57.00    Types: Cigarettes    Start date: 08/18/1959    Quit date: 08/14/2016    Years since quitting: 3.1  . Smokeless tobacco: Never Used  Substance Use Topics  . Alcohol use: No    Alcohol/week: 0.0 standard drinks  . Drug use: No     Allergies   Sevoflurane   Review of Systems Review of Systems  Constitutional: Negative for fever.  Respiratory: Positive for cough, shortness of breath and wheezing.   Cardiovascular: Negative for chest pain and leg swelling.  Gastrointestinal: Negative for abdominal pain, nausea and vomiting.  Genitourinary: Negative for dysuria.  All other systems reviewed and are negative.    Physical Exam Updated Vital Signs BP 111/64   Pulse (!) 106   Resp (!) 25   Ht 1.626 m (5\' 4" )   Wt 43.5 kg   SpO2 95%   BMI 16.48 kg/m   Physical Exam Vitals signs and nursing note reviewed.  Constitutional:      Comments: Thin, pale, chronically ill-appearing  HENT:     Head: Normocephalic and atraumatic.  Neck:     Musculoskeletal: Neck supple.  Cardiovascular:      Rate and Rhythm: Regular rhythm. Tachycardia present.     Heart sounds: Normal heart sounds.  Pulmonary:     Effort: Tachypnea, accessory muscle usage and respiratory distress present.     Breath sounds: No stridor. Decreased breath sounds present. No wheezing.     Comments: Speaking in short sentences, accessory muscle use noted, tight with limited air movement and an occasional wheeze Abdominal:     General: Bowel sounds are normal.     Palpations: Abdomen is soft.  Musculoskeletal:     Right lower leg: She exhibits no tenderness. No edema.     Left lower leg: She exhibits no tenderness. No edema.  Skin:    General: Skin is warm and dry.  Neurological:     Mental Status: She is alert and oriented to person, place, and time.  Psychiatric:        Mood and Affect: Mood normal.      ED Treatments / Results  Labs (all labs ordered are listed, but only abnormal results are displayed) Labs Reviewed  CBC WITH DIFFERENTIAL/PLATELET - Abnormal; Notable for the following components:      Result Value   Hemoglobin 11.7 (*)    Monocytes Absolute 1.1 (*)    Eosinophils Absolute 1.8 (*)    All other components within normal limits  BASIC METABOLIC PANEL - Abnormal; Notable for the following components:   Glucose, Bld 114 (*)    All other components within normal limits  SARS CORONAVIRUS 2 BY RT PCR [facility] ORDER, [place] LAB)    EKG EKG Interpretation  Date/Time:  Wednesday October 20 2019 03:53:25 EDT Ventricular Rate:  78 PR Interval:    QRS Duration: 91 QT Interval:  419 QTC Calculation: 478 R Axis:   23 Text Interpretation: Sinus rhythm Consider left atrial enlargement Confirmed by Thayer Jew [phone]) on 10/20/2019 5:33:52 AM   Radiology Dg Chest Portable 1 View  Result Date: 10/20/2019 CLINICAL DATA:  Shortness of breath EXAM: PORTABLE CHEST 1 VIEW COMPARISON:  09/08/2019 FINDINGS: Cardiac shadow is stable. The lungs are well  aerated bilaterally. No focal infiltrate or sizable effusion is seen. No bony abnormality is noted. IMPRESSION: No active disease. Electronically Signed   By: Inez Catalina M.D.   On: 10/20/2019 03:31    Procedures Procedures (including critical care time)  CRITICAL CARE Performed by: Merryl Hacker   Total critical care time: 45 minutes  Critical care time was exclusive of separately billable procedures and treating other patients.  Critical care was necessary to treat or prevent imminent or life-threatening deterioration.  Critical care was time spent personally by me on the following activities: development of treatment plan with patient and/or surrogate as well as nursing, discussions with consultants, evaluation of patient's response to treatment, examination of patient, obtaining history from patient or surrogate, ordering and performing treatments and interventions, ordering and review of laboratory studies, ordering and review of radiographic studies, pulse oximetry and re-evaluation of patient's condition.   Medications Ordered in ED Medications  ipratropium (ATROVENT HFA) inhaler 2 puff (2 puffs Inhalation Not Given 10/20/19 0410)  albuterol (VENTOLIN HFA) 108 (90 Base) MCG/ACT inhaler 6 puff (6 puffs Inhalation Given 10/20/19 0303)  magnesium sulfate IVPB 2 g 50 mL (0 g Intravenous Stopped 10/20/19 0329)  dexamethasone (DECADRON) injection 10 mg (10 mg Intravenous Given 10/20/19 0307)  albuterol (VENTOLIN HFA) 108 (90 Base) MCG/ACT inhaler 6 puff (6 puffs Inhalation Given 10/20/19 0410)  albuterol (PROVENTIL,VENTOLIN) solution continuous neb (15 mg/hr Nebulization Given 10/20/19 0438)  ipratropium (ATROVENT) nebulizer solution 0.5 mg (0.5 mg Nebulization Given 10/20/19 0438)     Initial Impression / Assessment and Plan / ED Course  I have reviewed the triage vital signs and the nursing notes.  Pertinent labs & imaging results that were available during my care of the  patient were reviewed by me and considered in my medical decision making (see chart for details).  Clinical Course as of Oct 19 746  Wed Oct 20, 2019  0539 Patient resting comfortably on a continuous nebulizer.  Covid testing was negative.  She states that she feels much better and feels that she will likely be able to go home.  She has frequent COPD exacerbations.  On recheck, her lungs are open with fair air movement and scattered wheezing.  We will have her finish nebulizer and reassess.   [CH]    Clinical Course User Index [CH] Audyn Dimercurio, Barbette Hair, MD       Patient presents with shortness of breath.  History of frequent COPD exacerbations.  She is on home oxygen.  On initial evaluation she is in mild respiratory distress with accessory muscle use and is tight.  Given current pandemic, she was unable to get a continuous neb and to her Covid testing came back negative.  She was initially given IV magnesium and an inhaler.  X-ray shows no evidence of pneumonia.  EKG is without ischemic changes.  Basic lab work is largely reassuring.  After Covid testing came back negative, patient was started on a continuous DuoNeb.  On multiple rechecks she is improving considerably.  She reports that she feels she can go home.  She frequently is able to be discharged after a continuous nebulizer.  On last recheck, she is resting comfortably and has fairly good air movement with only an occasional wheeze.  She is on her home oxygen.  She ambulated without difficulty.  Will discharge home with a burst dose of steroids and close follow-up with her primary physician.  After history, exam, and medical workup I feel the patient has  been appropriately medically screened and is safe for discharge home. Pertinent diagnoses were discussed with the patient. Patient was given return precautions.   Final Clinical Impressions(s) / ED Diagnoses   Final diagnoses:  COPD exacerbation [facility])    ED Discharge Orders          Ordered    predniSONE (DELTASONE) 20 MG tablet  Daily     10/20/19 0717           Merryl Hacker, MD 10/20/19 [phone]

## 2019-10-20 NOTE — ED Notes (Signed)
Pt ambulatory around nursing station with no difficulty or distress noted.

## 2019-10-20 NOTE — Discharge Instructions (Addendum)
You were seen today for your COPD.  Take steroids for the next 5 days.  Use your inhalers every 4-6 hours as needed.  It is important that you follow-up with your primary doctor.  You may need to have your maintenance medications adjusted.

## 2019-10-29 ENCOUNTER — Other Ambulatory Visit: Payer: Self-pay | Admitting: General Surgery

## 2019-11-05 DIAGNOSIS — E46 Unspecified protein-calorie malnutrition: Secondary | ICD-10-CM | POA: Diagnosis not present

## 2019-11-05 DIAGNOSIS — J301 Allergic rhinitis due to pollen: Secondary | ICD-10-CM | POA: Diagnosis not present

## 2019-11-05 DIAGNOSIS — F419 Anxiety disorder, unspecified: Secondary | ICD-10-CM | POA: Diagnosis not present

## 2019-11-05 DIAGNOSIS — J449 Chronic obstructive pulmonary disease, unspecified: Secondary | ICD-10-CM | POA: Diagnosis not present

## 2019-11-08 ENCOUNTER — Telehealth: Payer: Self-pay

## 2019-11-08 NOTE — Telephone Encounter (Signed)
Patient called stating that she had a black stool last night and her stomach was puffy. Please advise.

## 2019-11-16 ENCOUNTER — Other Ambulatory Visit: Payer: Self-pay

## 2019-11-19 ENCOUNTER — Emergency Department (HOSPITAL_COMMUNITY): Payer: Medicare Other

## 2019-11-19 ENCOUNTER — Other Ambulatory Visit: Payer: Self-pay

## 2019-11-19 ENCOUNTER — Emergency Department (HOSPITAL_COMMUNITY)
Admission: EM | Admit: 2019-11-19 | Discharge: 2019-11-19 | Disposition: A | Discharge status: Home / Self Care | Payer: Medicare Other | Attending: Emergency Medicine | Admitting: Emergency Medicine

## 2019-11-19 ENCOUNTER — Encounter (HOSPITAL_COMMUNITY): Payer: Self-pay

## 2019-11-19 DIAGNOSIS — Z87891 Personal history of nicotine dependence: Secondary | ICD-10-CM | POA: Diagnosis not present

## 2019-11-19 DIAGNOSIS — Z20828 Contact with and (suspected) exposure to other viral communicable diseases: Secondary | ICD-10-CM | POA: Diagnosis not present

## 2019-11-19 DIAGNOSIS — Z79899 Other long term (current) drug therapy: Secondary | ICD-10-CM | POA: Diagnosis not present

## 2019-11-19 DIAGNOSIS — I10 Essential (primary) hypertension: Secondary | ICD-10-CM | POA: Insufficient documentation

## 2019-11-19 DIAGNOSIS — J441 Chronic obstructive pulmonary disease with (acute) exacerbation: Secondary | ICD-10-CM | POA: Insufficient documentation

## 2019-11-19 DIAGNOSIS — R0602 Shortness of breath: Secondary | ICD-10-CM | POA: Diagnosis not present

## 2019-11-19 DIAGNOSIS — R0600 Dyspnea, unspecified: Secondary | ICD-10-CM

## 2019-11-19 LAB — BASIC METABOLIC PANEL
Anion gap: 8 (ref 5–15)
BUN: 20 mg/dL (ref 8–23)
CO2: 26 mmol/L (ref 22–32)
Calcium: 8.9 mg/dL (ref 8.9–10.3)
Chloride: 109 mmol/L (ref 98–111)
Creatinine, Ser: 0.83 mg/dL (ref 0.44–1.00)
GFR calc Af Amer: >60 mL/min (ref >60)
GFR calc non Af Amer: >60 mL/min (ref >60)
Glucose, Bld: 106 mg/dL — ABNORMAL HIGH (ref 70–99)
Potassium: 3.7 mmol/L (ref 3.5–5.1)
Sodium: 143 mmol/L (ref 135–145)

## 2019-11-19 LAB — CBC WITH DIFFERENTIAL/PLATELET
Abs Immature Granulocytes: 0.01 10*3/uL (ref 0.00–0.07)
Basophils Absolute: 0.1 10*3/uL (ref 0.0–0.1)
Basophils Relative: 1 %
Eosinophils Absolute: 0.8 10*3/uL — ABNORMAL HIGH (ref 0.0–0.5)
Eosinophils Relative: 10 %
HCT: 36.2 % (ref 36.0–46.0)
Hemoglobin: 11.2 g/dL — ABNORMAL LOW (ref 12.0–15.0)
Immature Granulocytes: 0 %
Lymphocytes Relative: 37 %
Lymphs Abs: 2.7 10*3/uL (ref 0.7–4.0)
MCH: 28.9 pg (ref 26.0–34.0)
MCHC: 30.9 g/dL (ref 30.0–36.0)
MCV: 93.5 fL (ref 80.0–100.0)
Monocytes Absolute: 1.1 10*3/uL — ABNORMAL HIGH (ref 0.1–1.0)
Monocytes Relative: 14 %
Neutro Abs: 2.8 10*3/uL (ref 1.7–7.7)
Neutrophils Relative %: 38 %
Platelets: 272 10*3/uL (ref 150–400)
RBC: 3.87 MIL/uL (ref 3.87–5.11)
RDW: 14.3 % (ref 11.5–15.5)
WBC: 7.4 10*3/uL (ref 4.0–10.5)
nRBC: 0 % (ref 0.0–0.2)

## 2019-11-19 LAB — NOVEL CORONAVIRUS, NAA: SARS-CoV-2, NAA: NEGATIVE

## 2019-11-19 LAB — SARS CORONAVIRUS 2 (TAT 6-24 HRS): SARS Coronavirus 2: NEGATIVE

## 2019-11-19 MED ORDER — ALBUTEROL SULFATE HFA 108 (90 BASE) MCG/ACT IN AERS
8.0000 | INHALATION_SPRAY | Freq: Once | RESPIRATORY_TRACT | Status: AC
  Administered 2019-11-19: 02:00:00 8 via RESPIRATORY_TRACT
  Filled 2019-11-19: qty 6.7

## 2019-11-19 MED ORDER — METHYLPREDNISOLONE SODIUM SUCC 125 MG IJ SOLR
125.0000 mg | Freq: Once | INTRAMUSCULAR | Status: AC
  Administered 2019-11-19: 02:00:00 125 mg via INTRAVENOUS
  Filled 2019-11-19: qty 2

## 2019-11-19 MED ORDER — IPRATROPIUM BROMIDE HFA 17 MCG/ACT IN AERS
4.0000 | INHALATION_SPRAY | Freq: Once | RESPIRATORY_TRACT | Status: DC
  Filled 2019-11-19: qty 12.9

## 2019-11-19 MED ORDER — IPRATROPIUM-ALBUTEROL 20-100 MCG/ACT IN AERS
2.0000 | INHALATION_SPRAY | Freq: Four times a day (QID) | RESPIRATORY_TRACT | Status: DC
  Filled 2019-11-19: qty 4

## 2019-11-19 MED ORDER — ALBUTEROL SULFATE HFA 108 (90 BASE) MCG/ACT IN AERS
6.0000 | INHALATION_SPRAY | Freq: Once | RESPIRATORY_TRACT | Status: AC
  Administered 2019-11-19: 6 via RESPIRATORY_TRACT

## 2019-11-19 MED ORDER — PREDNISONE 20 MG PO TABS: ORAL_TABLET | ORAL | 0 refills | Status: DC

## 2019-11-19 NOTE — ED Provider Notes (Signed)
[name] Provider Note   CSN: [id number] Arrival date & time: 11/19/19  0145     History   Chief Complaint Chief Complaint  Patient presents with  . Shortness of Breath    HPI Katelyn [name] is a 78 y.o. female.     Patient with history of significant COPD, previous smoker, on home oxygen as needed presents with worsening shortness of breath of the past 2 days.  Minimal cough.  No fever or known sick contacts.  Patient has cardiac history of blood clot history, no leg swelling.  Increased work of breathing with any activity.  This feels similar to COPD history.     Past Medical History:  Diagnosis Date  . Asthma   . Chronic neck pain   . Chronic pain of left elbow   . Complication of anesthesia    hard to wake  . COPD (chronic obstructive pulmonary disease) ([place])   . Dysphagia    requiring multiple dilations in past  . Headache    Patient states she needs  eyeglasses  . Irregular heart beat   . S/P colonoscopy 2009   dr. Gala Romney: anal papilla, 2 cecal AVMs. Due in 2014  . S/P endoscopy 2012   Dr. Gala Romney: mild chronic gastritis, bulbar erosions, s/p 54-French Maloney dilation   . S/P endoscopy 2003, 2004, 2007   gastritis, duodenitis, s/p dilation, +H.pylori     Patient Active Problem List   Diagnosis Date Noted  . Small bowel ischemia ([place]) 09/17/2019  . 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 Small bowel obstruction ([place])   . Acute on chronic respiratory failure with hypoxia ([place]) 03/17/2019  . IDA (iron deficiency anemia) 07/04/2018  . Acute metabolic encephalopathy [id number]  . Acute respiratory failure with hypoxia and hypercapnia ([place]) 07/02/2018  . GERD (gastroesophageal reflux disease) 06/13/2018  . Malnutrition ([place]) 06/13/2018  . Essential hypertension 06/13/2018  . Tachycardia 04/14/2018  . Acute gastritis 03/31/2018  . Esophagitis 03/31/2018  . Duodenal ulcer 03/31/2018  . Goals of care, counseling/discussion   .  DNR (do not resuscitate) discussion   . Palliative care by specialist   . COPD with acute exacerbation ([place]) 03/29/2018  . COPD (chronic obstructive pulmonary disease) ([place]) 02/03/2018  . Hypoxia 11/28/2015  . COPD exacerbation ([place]) 11/28/2015  . Acute respiratory failure with hypoxia ([place]) 11/28/2015  . SOB (shortness of breath) 11/28/2015  . Mucosal abnormality of stomach   . History of colonic polyps 04/25/2015  . 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 Dysphagia 03/11/2011    Past Surgical History:  Procedure Laterality Date  . APPENDECTOMY    . BIOPSY  03/30/2018   Procedure: BIOPSY;  Surgeon: Daneil Dolin, MD;  Location: [facility];  Service: Gastroenterology;;  gastric  . BOWEL RESECTION N/A 09/17/2019   Procedure: small bowel resection;  Surgeon: Virl Cagey, MD;  Location: [facility];  Service: General;  Laterality: N/A;  . CHOLECYSTECTOMY    . COLONOSCOPY  10/10/2008   RMR: 1. Anal papilla, otherwise normal rectum. 2. Two cecal arteriovenous malformations, colonic mucosa apperaed normal.   . COLONOSCOPY N/A 05/10/2015   RMR: Rectal and colonic polyps removed as described above. Ascending colon AVMs . Rectal polyps were tubular adenomas. Next colonoscopy May 2021.  . ESOPHAGEAL DILATION N/A 05/10/2015   Procedure: ESOPHAGEAL DILATION;  Surgeon: Daneil Dolin, MD;  Location: [facility];  Service: Endoscopy;  Laterality: N/A;  . ESOPHAGOGASTRODUODENOSCOPY  04/02/2011   RMR: 1. Endoscopically  normal -appearing esophagus status post passage of a Maloney dilator followed by biopsy. 2. Hiatal hernia, Mottling, and submucosal petechiae in teh gastric mucoas of uncertain significance status post biopsy. 3. Bulbar erosions as described above.   . ESOPHAGOGASTRODUODENOSCOPY N/A 05/10/2015   RMR: Normal esophagus status post Maloney dilation. Small hiatal hernia. Abnormal gastric because of doubtful clinical significance status post biopsy (benign)  . ESOPHAGOGASTRODUODENOSCOPY (EGD)  WITH PROPOFOL N/A 03/30/2018   Procedure: ESOPHAGOGASTRODUODENOSCOPY (EGD) WITH PROPOFOL;  Surgeon: Daneil Dolin, MD;  Location[facility];  Service: Gastroenterology;  Laterality: N/A;  WITH DILATION   . INGUINAL HERNIA REPAIR    . MALONEY DILATION  03/30/2018   Procedure: Venia Minks DILATION;  Surgeon: Daneil Dolin, MD;  Location: [facility];  Service: Gastroenterology;;  . TONSILLECTOMY    . TOTAL ABDOMINAL HYSTERECTOMY       OB History    Gravida  5   Para  5   Term  4   Preterm  1   AB      Living  4     SAB      TAB      Ectopic      Multiple      Live Births               Home Medications    Prior to Admission medications   Medication Sig Start Date End Date Taking? Authorizing Provider  albuterol (PROVENTIL) (2.5 MG/3ML) 0.083% nebulizer solution Take 2.5 mg by nebulization every 4 (four) hours as needed for shortness of breath.    [provider]  albuterol (VENTOLIN HFA) 108 (90 Base) MCG/ACT inhaler Inhale 2 puffs into the lungs every 6 (six) hours as needed for wheezing or shortness of breath. 09/08/19   Rancour, Annie Main, MD  docusate sodium (COLACE) 100 MG capsule Take 1 capsule (100 mg total) by mouth 2 (two) times daily as needed for mild constipation. 09/21/19 09/20/20  Virl Cagey, MD  doxycycline (VIBRAMYCIN) 100 MG capsule Take 1 capsule (100 mg total) by mouth 2 (two) times daily. 09/08/19   Rancour, Annie Main, MD  ferrous sulfate 325 (65 FE) MG tablet Take 1 tablet (325 mg total) by mouth daily with breakfast. 03/19/19   Sinda Du, MD  Multiple Vitamins-Minerals (MULTIVITAMIN ADULT) CHEW Chew 2 each by mouth daily. Vitafusion gummy    [provider]  ondansetron (ZOFRAN-ODT) 4 MG disintegrating tablet Take 1 tablet (4 mg total) by mouth every 6 (six) hours as needed for nausea. 09/21/19   Virl Cagey, MD  oxyCODONE (OXY IR/ROXICODONE) 5 MG immediate release tablet Take 1 tablet (5 mg total) by mouth every 4  (four) hours as needed for severe pain or breakthrough pain. 09/21/19   Virl Cagey, MD  OXYGEN Inhale 4 L into the lungs daily.    [provider]  predniSONE (DELTASONE) 20 MG tablet Take 2 tablets (40 mg total) by mouth daily. 10/20/19   Horton, Barbette Hair, MD  roflumilast (DALIRESP) 500 MCG TABS tablet Take 0.5 tablets (250 mcg total) by mouth daily. 07/05/18   Sinda Du, MD    Family History Family History  Problem Relation Age of Onset  . Heart disease Mother   . Lung cancer Father   . Cancer Sister   . Diabetes Brother   . Colon cancer Neg Hx     Social History Social History   Tobacco Use  . Smoking status: Former Smoker    Packs/day: 1.00  Years: 57.00    Pack years: 57.00    Types: Cigarettes    Start date: 08/18/1959    Quit date: 08/14/2016    Years since quitting: 3.2  . Smokeless tobacco: Never Used  Substance Use Topics  . Alcohol use: No    Alcohol/week: 0.0 standard drinks  . Drug use: No     Allergies   Sevoflurane   Review of Systems Review of Systems  Constitutional: Negative for chills and fever.  HENT: Negative for congestion.   Eyes: Negative for visual disturbance.  Respiratory: Positive for cough and shortness of breath.   Cardiovascular: Negative for chest pain.  Gastrointestinal: Negative for abdominal pain and vomiting.  Genitourinary: Negative for dysuria and flank pain.  Musculoskeletal: Negative for back pain, neck pain and neck stiffness.  Skin: Negative for rash.  Neurological: Negative for light-headedness and headaches.     Physical Exam Updated Vital Signs BP (!) 104/53   Pulse 79   Temp 98.1 F (36.7 C) (Oral)   Resp 18   Ht 5\' 5"  (1.651 m)   Wt 45.4 kg   SpO2 99%   BMI 16.64 kg/m   Physical Exam Vitals signs and nursing note reviewed.  Constitutional:      Appearance: She is well-developed.  HENT:     Head: Normocephalic and atraumatic.  Eyes:     General:        Right eye: No  discharge.        Left eye: No discharge.     Conjunctiva/sclera: Conjunctivae normal.  Neck:     Musculoskeletal: Normal range of motion and neck supple.     Trachea: No tracheal deviation.  Cardiovascular:     Rate and Rhythm: Normal rate and regular rhythm.  Pulmonary:     Effort: Tachypnea present.     Breath sounds: Wheezing present.  Abdominal:     General: There is no distension.     Palpations: Abdomen is soft.     Tenderness: There is no abdominal tenderness. There is no guarding.  Musculoskeletal:     Right lower leg: No edema.     Left lower leg: No edema.  Skin:    General: Skin is warm.     Findings: No rash.  Neurological:     Mental Status: She is alert and oriented to person, place, and time.      ED Treatments / Results  Labs (all labs ordered are listed, but only abnormal results are displayed) Labs Reviewed  CBC WITH DIFFERENTIAL/PLATELET - Abnormal; Notable for the following components:      Result Value   Hemoglobin 11.2 (*)    Monocytes Absolute 1.1 (*)    Eosinophils Absolute 0.8 (*)    All other components within normal limits  BASIC METABOLIC PANEL - Abnormal; Notable for the following components:   Glucose, Bld 106 (*)    All other components within normal limits  SARS CORONAVIRUS 2 (TAT 6-24 HRS)    EKG EKG Interpretation  Date/Time:  Friday November 19 2019 01:57:57 EST Ventricular Rate:  95 PR Interval:    QRS Duration: 97 QT Interval:  395 QTC Calculation: 489 R Axis:   16 Text Interpretation: Sinus rhythm Ventricular premature complex Borderline prolonged QT interval Poor baseline Confirmed by Elnora Morrison [phone]) on 11/19/2019 3:12:50 AM   Radiology Dg Chest Portable 1 View  Result Date: 11/19/2019 CLINICAL DATA:  Shortness of breath EXAM: PORTABLE CHEST 1 VIEW COMPARISON:  10/20/2019 FINDINGS: The lungs are  hyperinflated with diffuse interstitial prominence. No focal airspace consolidation or pulmonary edema. No pleural  effusion or pneumothorax. Normal cardiomediastinal contours. IMPRESSION: No active cardiopulmonary disease. Electronically Signed   By: Ulyses Jarred M.D.   On: 11/19/2019 02:40    Procedures Procedures (including critical care time)  Medications Ordered in ED Medications  Ipratropium-Albuterol (COMBIVENT) respimat 2 puff (2 puffs Inhalation Not Given 11/19/19 0303)  albuterol (VENTOLIN HFA) 108 (90 Base) MCG/ACT inhaler 8 puff (8 puffs Inhalation Given 11/19/19 0224)  methylPREDNISolone sodium succinate (SOLU-MEDROL) 125 mg/2 mL injection 125 mg (125 mg Intravenous Given 11/19/19 0224)  albuterol (VENTOLIN HFA) 108 (90 Base) MCG/ACT inhaler 6 puff (6 puffs Inhalation Given 11/19/19 0440)     Initial Impression / Assessment and Plan / ED Course  I have reviewed the triage vital signs and the nursing notes.  Pertinent labs & imaging results that were available during my care of the patient were reviewed by me and considered in my medical decision making (see chart for details).    Patient with recurrent COPD exacerbation feels similar to previous.   Patient improved on reassessment.  Outpatient Covid test sent.  Patient received 8 puffs of albuterol, steroids.  Repeat order of albuterol.  Patient not requiring oxygen in ED and patient does have oxygen if needed at home.  Plan for steroids outpatient and close outpatient follow-up.  Blood work reviewed normal white blood cell count, hemoglobin 0.2.  Chest x-ray no acute findings.  EKG no acute findings.   Samnatha Fouty Kreps was evaluated in Emergency Department on 11/19/2019 for the symptoms described in the history of present illness. She was evaluated in the context of the global COVID-19 pandemic, which necessitated consideration that the patient might be at risk for infection with the SARS-CoV-2 virus that causes COVID-19. Institutional protocols and algorithms that pertain to the evaluation of patients at risk for COVID-19 are in a state  of rapid change based on information released by regulatory bodies including the CDC and federal and state organizations. These policies and algorithms were followed during the patient's care in the ED.  Results and differential diagnosis were discussed with the patient/parent/guardian. Xrays were independently reviewed by myself.  Close follow up outpatient was discussed, comfortable with the plan.   Medications  Ipratropium-Albuterol (COMBIVENT) respimat 2 puff (2 puffs Inhalation Not Given 11/19/19 0303)  albuterol (VENTOLIN HFA) 108 (90 Base) MCG/ACT inhaler 8 puff (8 puffs Inhalation Given 11/19/19 0224)  methylPREDNISolone sodium succinate (SOLU-MEDROL) 125 mg/2 mL injection 125 mg (125 mg Intravenous Given 11/19/19 0224)  albuterol (VENTOLIN HFA) 108 (90 Base) MCG/ACT inhaler 6 puff (6 puffs Inhalation Given 11/19/19 0440)    Vitals:   11/19/19 0230 11/19/19 0300 11/19/19 0330 11/19/19 0400  BP: 127/71 101/61 (!) 101/53 (!) 104/53  Pulse: 74 73 84 79  Resp: (!) 22 19 16 18   Temp:      TempSrc:      SpO2: 100% 100% 100% 99%  Weight:      Height:        Final diagnoses:  Acute exacerbation of chronic obstructive pulmonary disease (COPD) ([facility])  Acute dyspnea     Final Clinical Impressions(s) / ED Diagnoses   Final diagnoses:  Acute exacerbation of chronic obstructive pulmonary disease (COPD) ([place])  Acute dyspnea    ED Discharge Orders    None       Elnora Morrison, MD 11/19/19 0448

## 2019-11-19 NOTE — Discharge Instructions (Signed)
Call your doctor let him know how you are doing.  Use your albuterol breathing medicines as directed. Fill your steroid prescription to start later today. Return for worsening shortness of breath, confusion, fevers or new concerns.  Follow-up your Covid test result in the next 24 hours.  Isolate yourself until you have that result.

## 2019-11-19 NOTE — ED Triage Notes (Signed)
Sob worsening over the past couple of days, denies pain.

## 2019-11-24 DIAGNOSIS — R079 Chest pain, unspecified: Secondary | ICD-10-CM | POA: Diagnosis not present

## 2019-11-24 DIAGNOSIS — E46 Unspecified protein-calorie malnutrition: Secondary | ICD-10-CM | POA: Diagnosis not present

## 2019-11-24 DIAGNOSIS — J449 Chronic obstructive pulmonary disease, unspecified: Secondary | ICD-10-CM | POA: Diagnosis not present

## 2019-11-24 DIAGNOSIS — F419 Anxiety disorder, unspecified: Secondary | ICD-10-CM | POA: Diagnosis not present

## 2020-01-09 DIAGNOSIS — J441 Chronic obstructive pulmonary disease with (acute) exacerbation: Secondary | ICD-10-CM | POA: Diagnosis not present

## 2020-01-16 ENCOUNTER — Emergency Department (HOSPITAL_COMMUNITY)
Admission: EM | Admit: 2020-01-16 | Discharge: 2020-01-16 | Discharge status: Against Medical Advice | Payer: Medicare PPO | Source: Home / Self Care | Attending: Emergency Medicine | Admitting: Emergency Medicine

## 2020-01-16 ENCOUNTER — Emergency Department (HOSPITAL_COMMUNITY)
Admission: EM | Admit: 2020-01-16 | Discharge: 2020-01-16 | Disposition: A | Discharge status: Home / Self Care | Payer: Medicare PPO | Attending: Emergency Medicine | Admitting: Emergency Medicine

## 2020-01-16 ENCOUNTER — Emergency Department (HOSPITAL_COMMUNITY): Payer: Medicare PPO

## 2020-01-16 ENCOUNTER — Encounter (HOSPITAL_COMMUNITY): Payer: Self-pay | Admitting: Emergency Medicine

## 2020-01-16 ENCOUNTER — Encounter (HOSPITAL_COMMUNITY): Payer: Self-pay | Admitting: *Deleted

## 2020-01-16 ENCOUNTER — Other Ambulatory Visit: Payer: Self-pay

## 2020-01-16 DIAGNOSIS — R079 Chest pain, unspecified: Secondary | ICD-10-CM

## 2020-01-16 DIAGNOSIS — I10 Essential (primary) hypertension: Secondary | ICD-10-CM | POA: Insufficient documentation

## 2020-01-16 DIAGNOSIS — Z20822 Contact with and (suspected) exposure to covid-19: Secondary | ICD-10-CM | POA: Diagnosis not present

## 2020-01-16 DIAGNOSIS — J449 Chronic obstructive pulmonary disease, unspecified: Secondary | ICD-10-CM | POA: Insufficient documentation

## 2020-01-16 DIAGNOSIS — R0602 Shortness of breath: Secondary | ICD-10-CM

## 2020-01-16 DIAGNOSIS — R0789 Other chest pain: Secondary | ICD-10-CM | POA: Diagnosis not present

## 2020-01-16 DIAGNOSIS — R002 Palpitations: Secondary | ICD-10-CM | POA: Diagnosis not present

## 2020-01-16 DIAGNOSIS — R000 Tachycardia, unspecified: Secondary | ICD-10-CM | POA: Diagnosis not present

## 2020-01-16 DIAGNOSIS — J441 Chronic obstructive pulmonary disease with (acute) exacerbation: Secondary | ICD-10-CM | POA: Diagnosis not present

## 2020-01-16 DIAGNOSIS — Z87891 Personal history of nicotine dependence: Secondary | ICD-10-CM | POA: Diagnosis not present

## 2020-01-16 DIAGNOSIS — Z79899 Other long term (current) drug therapy: Secondary | ICD-10-CM | POA: Insufficient documentation

## 2020-01-16 DIAGNOSIS — Z5320 Procedure and treatment not carried out because of patient's decision for unspecified reasons: Secondary | ICD-10-CM | POA: Insufficient documentation

## 2020-01-16 MED ORDER — METHYLPREDNISOLONE SODIUM SUCC 125 MG IJ SOLR
125.0000 mg | Freq: Once | INTRAMUSCULAR | Status: AC
  Administered 2020-01-16: 02:00:00 125 mg via INTRAVENOUS
  Filled 2020-01-16: qty 2

## 2020-01-16 MED ORDER — PREDNISONE 50 MG PO TABS
60.0000 mg | ORAL_TABLET | Freq: Once | ORAL | Status: AC
  Administered 2020-01-16: 60 mg via ORAL
  Filled 2020-01-16: qty 1

## 2020-01-16 MED ORDER — IPRATROPIUM-ALBUTEROL 0.5-2.5 (3) MG/3ML IN SOLN
3.0000 mL | RESPIRATORY_TRACT | Status: AC
  Administered 2020-01-16 (×3): 3 mL via RESPIRATORY_TRACT
  Filled 2020-01-16: qty 9

## 2020-01-16 MED ORDER — PREDNISONE 20 MG PO TABS: ORAL_TABLET | ORAL | 0 refills | Status: DC

## 2020-01-16 MED ORDER — ALBUTEROL SULFATE (2.5 MG/3ML) 0.083% IN NEBU
5.0000 mg | INHALATION_SOLUTION | Freq: Once | RESPIRATORY_TRACT | Status: AC
  Administered 2020-01-16: 02:00:00 5 mg via RESPIRATORY_TRACT
  Filled 2020-01-16: qty 6

## 2020-01-16 NOTE — Discharge Instructions (Addendum)
Return if you decide to want reevaluation of your symptoms

## 2020-01-16 NOTE — ED Triage Notes (Signed)
Pt concerned about HR is racing, also concerned about taking her prednisone due to pharmacy is closed today.  Was seen earlier this morning

## 2020-01-16 NOTE — ED Notes (Signed)
Pt ambulated in room by NT Michael on O2@ 2L. Pt tolerated well. O2 sats were 97% and beginning of ambulation and were noted to be 93% during ambulation. Pt's HR increased from 99 to 122 during ambulation and pt stated she felt a little short of breath. Dr Dayna Barker made aware.

## 2020-01-16 NOTE — ED Provider Notes (Signed)
[facility] EMERGENCY DEPARTMENT Provider Note   CSN: [id number] Arrival date & time: 01/16/20  [id number]     History Chief Complaint  Patient presents with  . Shortness of Breath    Katelyn [name] is a 79 y.o. female with past medical history significant for COPD who presents for evaluation of multiple complaints.  Patient was seen here yesterday evening and discharged early this morning.  Patient diagnosed with COPD exacerbation.  She has not been using her home albuterol or nebulizers.  Patient states she is concerned that he has not been able to get to her steroids as her pharmacy was closed today.  Patient states she when she went home she had an episode of chest tightness and palpitations which lasted approximately 30 minutes.  This self resolved.  No current chest pain, Hartness of breath.  Chest pain did not radiate to left arm, left jaw.  No associated diaphoresis, nausea or vomiting.  Denies additional aggravating or alleviating factors.  History of pain from patient and past medical records.  No interpreter is used.  HPI     Past Medical History:  Diagnosis Date  . Abnormal weight loss   . Acute respiratory failure with hypoxia ([place])   . Anxiety disorder, unspecified   . Asthma   . Chest pain, unspecified   . Chronic neck pain   . Chronic pain of left elbow   . Complication of anesthesia    hard to wake  . COPD (chronic obstructive pulmonary disease) ([place])   . Dysphagia    requiring multiple dilations in past  . Dysphagia, pharyngoesophageal phase   . Headache    Patient states she needs  eyeglasses  . Hyperlipidemia   . Hypoxemia   . Iron deficiency anemia   . Irregular heart beat   . Nicotine dependence, unspecified, uncomplicated   . Other amnesia   . Other diseases of stomach and duodenum   . Pain in left elbow   . Right lower quadrant pain   . S/P colonoscopy 2009   dr. Gala Romney: anal papilla, 2 cecal AVMs. Due in 2014  . S/P endoscopy 2012   Dr. Gala Romney: mild  chronic gastritis, bulbar erosions, s/p 54-French Maloney dilation   . S/P endoscopy 2003, 2004, 2007   gastritis, duodenitis, s/p dilation, +H.pylori   . Shortness of breath   . Solitary pulmonary nodule   . Unspecified chronic gastritis without bleeding   . Unspecified protein-calorie malnutrition [facility])     Patient Active Problem List   Diagnosis Date Noted  . Small bowel ischemia ([place]) 09/17/2019  . 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 Small bowel obstruction ([place])   . Acute on chronic respiratory failure with hypoxia ([place]) 03/17/2019  . IDA (iron deficiency anemia) 07/04/2018  . Acute metabolic encephalopathy [id number]  . Acute respiratory failure with hypoxia and hypercapnia ([place]) 07/02/2018  . GERD (gastroesophageal reflux disease) 06/13/2018  . Malnutrition ([place]) 06/13/2018  . Essential hypertension 06/13/2018  . Tachycardia 04/14/2018  . Acute gastritis 03/31/2018  . Esophagitis 03/31/2018  . Duodenal ulcer 03/31/2018  . Goals of care, counseling/discussion   . DNR (do not resuscitate) discussion   . Palliative care by specialist   . COPD with acute exacerbation ([place]) 03/29/2018  . COPD (chronic obstructive pulmonary disease) ([place]) 02/03/2018  . Hypoxia 11/28/2015  . COPD exacerbation ([place]) 11/28/2015  . Acute respiratory failure with hypoxia ([place]) 11/28/2015  . SOB (shortness of breath) 11/28/2015  . Mucosal abnormality of stomach   .  History of colonic polyps 04/25/2015  . 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 Dysphagia 03/11/2011    Past Surgical History:  Procedure Laterality Date  . APPENDECTOMY    . BIOPSY  03/30/2018   Procedure: BIOPSY;  Surgeon: Daneil Dolin, MD;  Location: [facility] ENDO SUITE;  Service: Gastroenterology;;  gastric  . BOWEL RESECTION N/A 09/17/2019   Procedure: small bowel resection;  Surgeon: Virl Cagey, MD;  Location: [facility] ORS;  Service: General;  Laterality: N/A;  . CHOLECYSTECTOMY    . COLONOSCOPY  10/10/2008   RMR: 1. Anal  papilla, otherwise normal rectum. 2. Two cecal arteriovenous malformations, colonic mucosa apperaed normal.   . COLONOSCOPY N/A 05/10/2015   RMR: Rectal and colonic polyps removed as described above. Ascending colon AVMs . Rectal polyps were tubular adenomas. Next colonoscopy May 2021.  . ESOPHAGEAL DILATION N/A 05/10/2015   Procedure: ESOPHAGEAL DILATION;  Surgeon: Daneil Dolin, MD;  Location: [facility] END[facility] SU[facility];  Service: Endoscopy;  Laterality: N/A;  . ESOPHAGOGASTRODUODENOSCOPY  04/02/2011   RMR: 1. Endoscopically normal -appearing esophagus status post passage of a Maloney dilator followed by biopsy. 2. Hiatal hernia, Mottling, and submucosal petechiae in teh gastric mucoas of uncertain significance status post biopsy. 3. Bulbar erosions as described above.   . ESOPHAGOGASTRODUODENOSCOPY N/A 05/10/2015   RMR: Normal esophagus status post Maloney dilation. Small hiatal hernia. Abnormal gastric because of doubtful clinical significance status post biopsy (benign)  . ESOPHAGOGASTRODUODENOSCOPY (EGD) WITH PROPOFOL N/A 03/30/2018   Procedure: ESOPHAGOGASTRODUODENOSCOPY (EGD) WITH PROPOFOL;  Surgeon: Daneil Dolin, MD;  Location: [facility];  Service: Gastroenterology;  Laterality: N/A;  WITH DILATION   . INGUINAL HERNIA REPAIR    . MALONEY DILATION  03/30/2018   Procedure: Venia Minks DILATION;  Surgeon: Daneil Dolin, MD;  Location: [facility];  Service: Gastroenterology;;  . TONSILLECTOMY    . TOTAL ABDOMINAL HYSTERECTOMY       OB History    Gravida  5   Para  5   Term  4   Preterm  1   AB      Living  4     SAB      TAB      Ectopic      Multiple      Live Births              Family History  Problem Relation Age of Onset  . Heart disease Mother   . Lung cancer Father   . Cancer Sister   . Diabetes Brother   . Colon cancer Neg Hx     Social History   Tobacco Use  . Smoking status: Former Smoker    Packs/day: 1.00    Years: 57.00    Pack years: 57.00     Types: Cigarettes    Start date: 08/18/1959    Quit date: 08/14/2016    Years since quitting: 3.4  . Smokeless tobacco: Never Used  Substance Use Topics  . Alcohol use: No    Alcohol/week: 0.0 standard drinks  . Drug use: No    Home Medications Prior to Admission medications   Medication Sig Start Date End Date Taking? Authorizing Provider  albuterol (PROVENTIL) (2.5 MG/3ML) 0.083% nebulizer solution Take 2.5 mg by nebulization every 4 (four) hours as needed for shortness of breath.    [provider]  albuterol (VENTOLIN HFA) 108 (90 Base) MCG/ACT inhaler Inhale 2 puffs into the lungs every 6 (six) hours as needed for wheezing or shortness of  breath. 09/08/19   Rancour, Annie Main, MD  docusate sodium (COLACE) 100 MG capsule Take 1 capsule (100 mg total) by mouth 2 (two) times daily as needed for mild constipation. 09/21/19 09/20/20  Virl Cagey, MD  ferrous sulfate 325 (65 FE) MG tablet Take 1 tablet (325 mg total) by mouth daily with breakfast. 03/19/19   Sinda Du, MD  Multiple Vitamins-Minerals (MULTIVITAMIN ADULT) CHEW Chew 2 each by mouth daily. Vitafusion gummy    [provider]  ondansetron (ZOFRAN-ODT) 4 MG disintegrating tablet Take 1 tablet (4 mg total) by mouth every 6 (six) hours as needed for nausea. 09/21/19   Virl Cagey, MD  OXYGEN Inhale 4 L into the lungs daily.    [provider]  predniSONE (DELTASONE) 20 MG tablet 3 tabs po day one, then 2 po daily x 4 days 01/16/20   Mesner, Corene Cornea, MD  roflumilast (DALIRESP) 500 MCG TABS tablet Take 0.5 tablets (250 mcg total) by mouth daily. 07/05/18   Sinda Du, MD    Allergies    Sevoflurane  Review of Systems   Review of Systems  Constitutional: Negative.   HENT: Negative.   Respiratory: Positive for cough, chest tightness and shortness of breath. Negative for apnea, choking, wheezing and stridor.   Cardiovascular: Positive for palpitations. Negative for chest pain and leg  swelling.  Gastrointestinal: Negative.   Genitourinary: Negative.   Musculoskeletal: Negative.   Skin: Negative.   Neurological: Negative.   All other systems reviewed and are negative.   Physical Exam Updated Vital Signs BP 136/84   Pulse (!) 104   SpO2 92%   Physical Exam Vitals and nursing note reviewed.  Constitutional:      General: She is not in acute distress.    Appearance: She is well-developed. She is ill-appearing (Chronically ill appearing). She is not toxic-appearing.  HENT:     Head: Atraumatic.  Eyes:     Pupils: Pupils are equal, round, and reactive to light.  Cardiovascular:     Rate and Rhythm: Normal rate.     Pulses: Normal pulses.     Heart sounds: Normal heart sounds.     Comments: Heart rate 86 in room Pulmonary:     Effort: No respiratory distress.     Breath sounds: Wheezing present.     Comments: Mild diffuse expiratory wheeze. Abdominal:     General: There is no distension.     Comments: Soft, nontender.  Musculoskeletal:        General: Normal range of motion.     Cervical back: Normal range of motion.     Comments: No evidence of DVT on exam.  Skin:    General: Skin is warm and dry.     Capillary Refill: Capillary refill takes less than 2 seconds.  Neurological:     General: No focal deficit present.     Mental Status: She is alert.     Comments: Ambulatory from waiting room to room without difficulty.     ED Results / Procedures / Treatments   Labs (all labs ordered are listed, but only abnormal results are displayed) Labs Reviewed - No data to display  EKG None  Radiology DG Chest [facility] 1 View  Result Date: 01/16/2020 CLINICAL DATA:  Increased shortness of breath today. EXAM: PORTABLE CHEST 1 VIEW COMPARISON:  Radiograph 11/19/2019. FINDINGS: Chronic hyperinflation. Central bronchial thickening. Unchanged heart size and mediastinal contours. Blunting of the costophrenic angles likely due to hyperinflation. No pulmonary edema,  pneumothorax,  or large pleural effusion no acute osseous abnormalities are seen. IMPRESSION: Chronic hyperinflation and bronchial thickening consistent with COPD. No superimposed acute abnormality. Electronically Signed   By: Keith Rake M.D.   On: 01/16/2020 01:54    Procedures Procedures (including critical care time)  Medications Ordered in ED Medications  predniSONE (DELTASONE) tablet 60 mg (60 mg Oral Given 01/16/20 1710)    ED Course  I have reviewed the triage vital signs and the nursing notes.  Pertinent labs & imaging results that were available during my care of the patient were reviewed by me and considered in my medical decision making (see chart for details).  79 year old female history of COPD, recently evaluated proximally 15 hours ago for COPD exacerbation.  Patient states she went home however is not able to pick up her prednisone prescription as her pharmacy was closed.  Patient states while she was at home she did have episode of chest tightness as well as palpitations.  Symptoms self resolved prior to arrival.  Patient is concerned that she has not had her prednisone and is requesting dose here.  Heart clear.  Heart rate 86 in room.  Mild diffuse expiratory wheeze throughout.  She was able to ambulate from waiting room to room without difficulty.  No evidence of DVT on exam.  Would like to obtain labs, imaging on patient.  Patient declines any work-up at this time.  She states she is just here for "a dose of her steroids."  I discussed with patient cannot rule out acute cardiac or pulmonary etiology such as MI, PE, dissection without labs and imaging.  Patient voiced understanding and declined any testing at this time and is requesting to go home.  Patient has decided to leave [place].  We discussed the nature and purpose, risks and benefits, as well as, the alternatives of treatment. Time was given to allow the opportunity to ask questions and consider  their options, and after the discussion, the patient decided to refuse the offerred treatment. The patient was informed that refusal could lead to, but was not limited to, death, permanent disability, or severe pain. If present, I asked the relatives or significant others to dissuade them without success. Prior to refusing, I determined that the patient had the capacity to make their decision and understood the consequences of that decision. After refusal, I made every reasonable opportunity to treat them to the best of my ability.  The patient was notified that they may return to the emergency department at any time for further treatment.       MDM Rules/Calculators/A&P                      Katelyn [name] was evaluated in Emergency Department on 01/16/2020 for the symptoms described in the history of present illness. She was evaluated in the context of the global COVID-19 pandemic, which necessitated consideration that the patient might be at risk for infection with the SARS-CoV-2 virus that causes COVID-19. Institutional protocols and algorithms that pertain to the evaluation of patients at risk for COVID-19 are in a state of rapid change based on information released by regulatory bodies including the CDC and federal and state organizations. These policies and algorithms were followed during the patient's care in the ED. Final Clinical Impression(s) / ED Diagnoses Final diagnoses:  Chest pain, unspecified type  SOB (shortness of breath)  Palpitations    Rx / DC Orders ED Discharge Orders    None  Clevie Prout A, PA-C 01/16/20 1718    Lajean Saver, MD 01/16/20 1842

## 2020-01-16 NOTE — ED Triage Notes (Signed)
Pt with increased shortness of breath today. HX: COPD on O2 @2l  @ home.

## 2020-01-16 NOTE — ED Notes (Signed)
Patient states she needs to leave. Explained leaving AMA to patient. Patient verbalized understanding.

## 2020-01-16 NOTE — ED Provider Notes (Addendum)
[facility] EMERGENCY DEPARTMENT Provider Note   CSN: [id number] Arrival date & time: 01/16/20  0121     History Chief Complaint  Patient presents with  . Shortness of Breath    Katelyn [name] is a 79 y.o. female.   Shortness of Breath Severity:  Moderate Onset quality:  Gradual Timing:  Sporadic Chronicity:  New Relieved by:  Nothing Worsened by:  Nothing Ineffective treatments:  None tried Associated symptoms: no abdominal pain, no chest pain, no cough, no fever and no sore throat        Past Medical History:  Diagnosis Date  . Abnormal weight loss   . Acute respiratory failure with hypoxia ([place])   . Anxiety disorder, unspecified   . Asthma   . Chest pain, unspecified   . Chronic neck pain   . Chronic pain of left elbow   . Complication of anesthesia    hard to wake  . COPD (chronic obstructive pulmonary disease) ([place])   . Dysphagia    requiring multiple dilations in past  . Dysphagia, pharyngoesophageal phase   . Headache    Patient states she needs  eyeglasses  . Hyperlipidemia   . Hypoxemia   . Iron deficiency anemia   . Irregular heart beat   . Nicotine dependence, unspecified, uncomplicated   . Other amnesia   . Other diseases of stomach and duodenum   . Pain in left elbow   . Right lower quadrant pain   . S/P colonoscopy 2009   dr. Gala Romney: anal papilla, 2 cecal AVMs. Due in 2014  . S/P endoscopy 2012   Dr. Gala Romney: mild chronic gastritis, bulbar erosions, s/p 54-French Maloney dilation   . S/P endoscopy 2003, 2004, 2007   gastritis, duodenitis, s/p dilation, +H.pylori   . Shortness of breath   . Solitary pulmonary nodule   . Unspecified chronic gastritis without bleeding   . Unspecified protein-calorie malnutrition [facility])     Patient Active Problem List   Diagnosis Date Noted  . Small bowel ischemia ([place]) 09/17/2019  . 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 Small bowel obstruction ([place])   . Acute on chronic respiratory failure with  hypoxia ([place]) 03/17/2019  . IDA (iron deficiency anemia) 07/04/2018  . Acute metabolic encephalopathy [id number]  . Acute respiratory failure with hypoxia and hypercapnia ([place]) 07/02/2018  . GERD (gastroesophageal reflux disease) 06/13/2018  . Malnutrition ([place]) 06/13/2018  . Essential hypertension 06/13/2018  . Tachycardia 04/14/2018  . Acute gastritis 03/31/2018  . Esophagitis 03/31/2018  . Duodenal ulcer 03/31/2018  . Goals of care, counseling/discussion   . DNR (do not resuscitate) discussion   . Palliative care by specialist   . COPD with acute exacerbation ([place]) 03/29/2018  . COPD (chronic obstructive pulmonary disease) ([place]) 02/03/2018  . Hypoxia 11/28/2015  . COPD exacerbation ([place]) 11/28/2015  . Acute respiratory failure with hypoxia ([place]) 11/28/2015  . SOB (shortness of breath) 11/28/2015  . Mucosal abnormality of stomach   . History of colonic polyps 04/25/2015  . 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 Dysphagia 03/11/2011    Past Surgical History:  Procedure Laterality Date  . APPENDECTOMY    . BIOPSY  03/30/2018   Procedure: BIOPSY;  Surgeon: Daneil Dolin, MD;  Location: [facility] ENDO SUITE;  Service: Gastroenterology;;  gastric  . BOWEL RESECTION N/A 09/17/2019   Procedure: small bowel resection;  Surgeon: Virl Cagey, MD;  Location: [facility] ORS;  Service: General;  Laterality: N/A;  . CHOLECYSTECTOMY    . COLONOSCOPY  10/10/2008   RMR: 1. Anal papilla, otherwise normal rectum. 2. Two cecal arteriovenous malformations, colonic mucosa apperaed normal.   . COLONOSCOPY N/A 05/10/2015   RMR: Rectal and colonic polyps removed as described above. Ascending colon AVMs . Rectal polyps were tubular adenomas. Next colonoscopy May 2021.  . ESOPHAGEAL DILATION N/A 05/10/2015   Procedure: ESOPHAGEAL DILATION;  Surgeon: Daneil Dolin, MD;  Location: [facility] ENDO SUITE;  Service: Endoscopy;  Laterality: N/A;  . ESOPHAGOGASTRODUODENOSCOPY  04/02/2011   RMR: 1. Endoscopically normal -appearing  esophagus status post passage of a Maloney dilator followed by biopsy. 2. Hiatal hernia, Mottling, and submucosal petechiae in teh gastric mucoas of uncertain significance status post biopsy. 3. Bulbar erosions as described above.   . ESOPHAGOGASTRODUODENOSCOPY N/A 05/10/2015   RMR: Normal esophagus status post Maloney dilation. Small hiatal hernia. Abnormal gastric because of doubtful clinical significance status post biopsy (benign)  . ESOPHAGOGASTRODUODENOSCOPY (EGD) WITH PROPOFOL N/A 03/30/2018   Procedure: ESOPHAGOGASTRODUODENOSCOPY (EGD) WITH PROPOFOL;  Surgeon: Daneil Dolin, MD;  Location: [facility] END[facility] SU[facility];  Service: Gastroenterology;  Laterality: N/A;  WITH DILATION   . INGUINAL HERNIA REPAIR    . MALONEY DILATION  03/30/2018   Procedure: Venia Minks DILATION;  Surgeon: Daneil Dolin, MD;  Location: [facility] END[facility] SU[facility];  Service: Gastroenterology;;  . TONSILLECTOMY    . TOTAL ABDOMINAL HYSTERECTOMY       OB History    Gravida  5   Para  5   Term  4   Preterm  1   AB      Living  4     SAB      TAB      Ectopic      Multiple      Live Births              Family History  Problem Relation Age of Onset  . Heart disease Mother   . Lung cancer Father   . Cancer Sister   . Diabetes Brother   . Colon cancer Neg Hx     Social History   Tobacco Use  . Smoking status: Former Smoker    Packs/day: 1.00    Years: 57.00    Pack years: 57.00    Types: Cigarettes    Start date: 08/18/1959    Quit date: 08/14/2016    Years since quitting: 3.4  . Smokeless tobacco: Never Used  Substance Use Topics  . Alcohol use: No    Alcohol/week: 0.0 standard drinks  . Drug use: No    Home Medications Prior to Admission medications   Medication Sig Start Date End Date Taking? Authorizing Provider  albuterol (PROVENTIL) (2.5 MG/3ML) 0.083% nebulizer solution Take 2.5 mg by nebulization every 4 (four) hours as needed for shortness of breath.    [provider]  albuterol  (VENTOLIN HFA) 108 (90 Base) MCG/ACT inhaler Inhale 2 puffs into the lungs every 6 (six) hours as needed for wheezing or shortness of breath. 09/08/19   Rancour, Annie Main, MD  docusate sodium (COLACE) 100 MG capsule Take 1 capsule (100 mg total) by mouth 2 (two) times daily as needed for mild constipation. 09/21/19 09/20/20  Virl Cagey, MD  ferrous sulfate 325 (65 FE) MG tablet Take 1 tablet (325 mg total) by mouth daily with breakfast. 03/19/19   Sinda Du, MD  Multiple Vitamins-Minerals (MULTIVITAMIN ADULT) CHEW Chew 2 each by mouth daily. Vitafusion gummy    [provider]  ondansetron (ZOFRAN-ODT) 4 MG disintegrating tablet Take  1 tablet (4 mg total) by mouth every 6 (six) hours as needed for nausea. 09/21/19   Virl Cagey, MD  OXYGEN Inhale 4 L into the lungs daily.    [provider]  predniSONE (DELTASONE) 20 MG tablet 3 tabs po day one, then 2 po daily x 4 days 01/16/20   Shany Marinez, Corene Cornea, MD  roflumilast (DALIRESP) 500 MCG TABS tablet Take 0.5 tablets (250 mcg total) by mouth daily. 07/05/18   Sinda Du, MD    Allergies    Sevoflurane  Review of Systems   Review of Systems  Constitutional: Negative for fever.  HENT: Negative for sore throat.   Respiratory: Positive for shortness of breath. Negative for cough.   Cardiovascular: Negative for chest pain.  Gastrointestinal: Negative for abdominal pain.  All other systems reviewed and are negative.   Physical Exam Updated Vital Signs BP (!) 101/53   Pulse 93   Resp (!) 28   Ht 5\' 5"  (1.651 m)   Wt 40.8 kg   SpO2 93%   BMI 14.98 kg/m   Physical Exam Vitals and nursing note reviewed.  Constitutional:      Appearance: She is well-developed.  HENT:     Head: Normocephalic and atraumatic.     Mouth/Throat:     Mouth: Mucous membranes are dry.  Cardiovascular:     Rate and Rhythm: Normal rate and regular rhythm.  Pulmonary:     Effort: Tachypnea present. No respiratory distress.      Breath sounds: No stridor. Decreased breath sounds and wheezing present.  Abdominal:     General: There is no distension.     Palpations: Abdomen is soft.  Musculoskeletal:     Cervical back: Normal range of motion.     Right lower leg: No edema.     Left lower leg: No edema.  Skin:    General: Skin is warm and dry.  Neurological:     General: No focal deficit present.     Mental Status: She is alert.     ED Results / Procedures / Treatments   Labs (all labs ordered are listed, but only abnormal results are displayed) Labs Reviewed  POC SARS CORONAVIRUS 2 AG -  ED  POC SARS CORONAVIRUS 2 AG -  ED    EKG None  Radiology DG Chest Port 1 View  Result Date: 01/16/2020 CLINICAL DATA:  Increased shortness of breath today. EXAM: PORTABLE CHEST 1 VIEW COMPARISON:  Radiograph 11/19/2019. FINDINGS: Chronic hyperinflation. Central bronchial thickening. Unchanged heart size and mediastinal contours. Blunting of the costophrenic angles likely due to hyperinflation. No pulmonary edema, pneumothorax, or large pleural effusion no acute osseous abnormalities are seen. IMPRESSION: Chronic hyperinflation and bronchial thickening consistent with COPD. No superimposed acute abnormality. Electronically Signed   By: Keith Rake M.D.   On: 01/16/2020 01:54    Procedures Procedures (including critical care time)  Medications Ordered in ED Medications  albuterol (PROVENTIL) (2.5 MG/3ML) 0.083% nebulizer solution 5 mg (5 mg Nebulization Given 01/16/20 0142)  ipratropium-albuterol (DUONEB) 0.5-2.5 (3) MG/3ML nebulizer solution 3 mL (3 mLs Nebulization Given 01/16/20 0153)  methylPREDNISolone sodium succinate (SOLU-MEDROL) 125 mg/2 mL injection 125 mg (125 mg Intravenous Given 01/16/20 0157)    ED Course  I have reviewed the triage vital signs and the nursing notes.  Pertinent labs & imaging results that were available during my care of the patient were reviewed by me and considered in my medical  decision making (see chart for details).  MDM Rules/Calculators/A&P                     Likely copd exacerbation, low likelihood of infectious cause without productive cough or fever. Will treat for copd.  Did well with treatments and steroids. Will continue same at home. Ambulated with mild tachypnea but state she feels much better than previously. No hypoxia.   Eliahna Stigers Bordas was evaluated in Emergency Department on 01/16/2020 for the symptoms described in the history of present illness. She was evaluated in the context of the global COVID-19 pandemic, which necessitated consideration that the patient might be at risk for infection with the SARS-CoV-2 virus that causes COVID-19. Institutional protocols and algorithms that pertain to the evaluation of patients at risk for COVID-19 are in a state of rapid change based on information released by regulatory bodies including the CDC and federal and state organizations. These policies and algorithms were followed during the patient's care in the ED.    Final Clinical Impression(s) / ED Diagnoses Final diagnoses:  COPD exacerbation ([place])    Rx / DC Orders ED Discharge Orders         Ordered    predniSONE (DELTASONE) 20 MG tablet     01/16/20 0450           Khyan Oats, Corene Cornea, MD 01/16/20 [id number]    Merrily Pew, MD 01/16/20 [phone]

## 2020-01-23 DIAGNOSIS — J449 Chronic obstructive pulmonary disease, unspecified: Secondary | ICD-10-CM | POA: Diagnosis not present

## 2020-01-23 DIAGNOSIS — R413 Other amnesia: Secondary | ICD-10-CM | POA: Diagnosis not present

## 2020-01-23 DIAGNOSIS — Z681 Body mass index (BMI) 19 or less, adult: Secondary | ICD-10-CM | POA: Diagnosis not present

## 2020-01-23 DIAGNOSIS — Z87891 Personal history of nicotine dependence: Secondary | ICD-10-CM | POA: Diagnosis not present

## 2020-01-23 DIAGNOSIS — D649 Anemia, unspecified: Secondary | ICD-10-CM | POA: Diagnosis not present

## 2020-01-23 DIAGNOSIS — E46 Unspecified protein-calorie malnutrition: Secondary | ICD-10-CM | POA: Diagnosis not present

## 2020-02-09 DIAGNOSIS — J441 Chronic obstructive pulmonary disease with (acute) exacerbation: Secondary | ICD-10-CM | POA: Diagnosis not present

## 2020-02-23 ENCOUNTER — Ambulatory Visit: Payer: Medicare Other | Admitting: Family Medicine

## 2020-02-25 ENCOUNTER — Encounter (HOSPITAL_COMMUNITY): Payer: Self-pay

## 2020-02-25 ENCOUNTER — Emergency Department (HOSPITAL_COMMUNITY): Payer: Medicare PPO

## 2020-02-25 ENCOUNTER — Emergency Department (HOSPITAL_COMMUNITY)
Admission: EM | Admit: 2020-02-25 | Discharge: 2020-02-25 | Disposition: A | Discharge status: Home / Self Care | Payer: Medicare PPO | Attending: Emergency Medicine | Admitting: Emergency Medicine

## 2020-02-25 ENCOUNTER — Other Ambulatory Visit: Payer: Self-pay

## 2020-02-25 DIAGNOSIS — Z87891 Personal history of nicotine dependence: Secondary | ICD-10-CM | POA: Insufficient documentation

## 2020-02-25 DIAGNOSIS — R0602 Shortness of breath: Secondary | ICD-10-CM | POA: Diagnosis not present

## 2020-02-25 DIAGNOSIS — Z79899 Other long term (current) drug therapy: Secondary | ICD-10-CM | POA: Insufficient documentation

## 2020-02-25 DIAGNOSIS — I10 Essential (primary) hypertension: Secondary | ICD-10-CM | POA: Insufficient documentation

## 2020-02-25 DIAGNOSIS — J441 Chronic obstructive pulmonary disease with (acute) exacerbation: Secondary | ICD-10-CM | POA: Diagnosis not present

## 2020-02-25 DIAGNOSIS — R002 Palpitations: Secondary | ICD-10-CM | POA: Diagnosis not present

## 2020-02-25 LAB — CBC WITH DIFFERENTIAL/PLATELET
Abs Immature Granulocytes: 0.01 10*3/uL (ref 0.00–0.07)
Basophils Absolute: 0 10*3/uL (ref 0.0–0.1)
Basophils Relative: 1 %
Eosinophils Absolute: 0.2 10*3/uL (ref 0.0–0.5)
Eosinophils Relative: 5 %
HCT: 38.8 % (ref 36.0–46.0)
Hemoglobin: 12 g/dL (ref 12.0–15.0)
Immature Granulocytes: 0 %
Lymphocytes Relative: 32 %
Lymphs Abs: 1.4 10*3/uL (ref 0.7–4.0)
MCH: 28.8 pg (ref 26.0–34.0)
MCHC: 30.9 g/dL (ref 30.0–36.0)
MCV: 93 fL (ref 80.0–100.0)
Monocytes Absolute: 0.7 10*3/uL (ref 0.1–1.0)
Monocytes Relative: 17 %
Neutro Abs: 2 10*3/uL (ref 1.7–7.7)
Neutrophils Relative %: 45 %
Platelets: 219 10*3/uL (ref 150–400)
RBC: 4.17 MIL/uL (ref 3.87–5.11)
RDW: 12.1 % (ref 11.5–15.5)
WBC: 4.4 10*3/uL (ref 4.0–10.5)
nRBC: 0 % (ref 0.0–0.2)

## 2020-02-25 LAB — BASIC METABOLIC PANEL
Anion gap: 6 (ref 5–15)
BUN: 10 mg/dL (ref 8–23)
CO2: 32 mmol/L (ref 22–32)
Calcium: 8.8 mg/dL — ABNORMAL LOW (ref 8.9–10.3)
Chloride: 102 mmol/L (ref 98–111)
Creatinine, Ser: 0.77 mg/dL (ref 0.44–1.00)
GFR calc Af Amer: >60 mL/min (ref >60)
GFR calc non Af Amer: >60 mL/min (ref >60)
Glucose, Bld: 104 mg/dL — ABNORMAL HIGH (ref 70–99)
Potassium: 4.1 mmol/L (ref 3.5–5.1)
Sodium: 140 mmol/L (ref 135–145)

## 2020-02-25 MED ORDER — PREDNISONE 20 MG PO TABS: ORAL_TABLET | ORAL | 0 refills | Status: DC

## 2020-02-25 MED ORDER — ALBUTEROL SULFATE HFA 108 (90 BASE) MCG/ACT IN AERS
2.0000 | INHALATION_SPRAY | RESPIRATORY_TRACT | Status: DC | PRN
  Administered 2020-02-25: 2 via RESPIRATORY_TRACT
  Filled 2020-02-25: qty 6.7

## 2020-02-25 NOTE — ED Triage Notes (Signed)
Pt reports that she has been SOB for 3 nights. Pt reports that she had oxygen prn up until 2 weeks ago. Had been seeing Dr Luan Pulling and now has a new DR to see next week to renew oxygen.

## 2020-02-25 NOTE — ED Provider Notes (Signed)
[facility] EMERGENCY DEPARTMENT Provider Note   CSN: [id number] Arrival date & time: 02/25/20  1535     History Chief Complaint  Patient presents with  . Shortness of Breath    Katelyn [name] is a 79 y.o. female.  HPI Patient with history of COPD presents the emerge department for shortness of breath worsening over the last several days, associated with palpitations but denies any chest pain, cough or fever.  She reports that she had been on oxygen as needed up until 2 weeks ago but apparently her order expired and she is waiting to see a new doctor next week for renewal.  She is unable to tell me the name of the doctor that she sees for this.  She was in the emergency department approximately 6 weeks ago for COPD exacerbation and completed a course of steroids then.    Past Medical History:  Diagnosis Date  . Abnormal weight loss   . Acute respiratory failure with hypoxia ([place])   . Anxiety disorder, unspecified   . Asthma   . Chest pain, unspecified   . Chronic neck pain   . Chronic pain of left elbow   . Complication of anesthesia    hard to wake  . COPD (chronic obstructive pulmonary disease) ([place])   . Dysphagia    requiring multiple dilations in past  . Dysphagia, pharyngoesophageal phase   . Headache    Patient states she needs  eyeglasses  . Hyperlipidemia   . Hypoxemia   . Iron deficiency anemia   . Irregular heart beat   . Nicotine dependence, unspecified, uncomplicated   . Other amnesia   . Other diseases of stomach and duodenum   . Pain in left elbow   . Right lower quadrant pain   . S/P colonoscopy 2009   dr. Gala Romney: anal papilla, 2 cecal AVMs. Due in 2014  . S/P endoscopy 2012   Dr. Gala Romney: mild chronic gastritis, bulbar erosions, s/p 54-French Maloney dilation   . S/P endoscopy 2003, 2004, 2007   gastritis, duodenitis, s/p dilation, +H.pylori   . Shortness of breath   . Solitary pulmonary nodule   . Unspecified chronic gastritis without bleeding   .  Unspecified protein-calorie malnutrition [facility])     Patient Active Problem List   Diagnosis Date Noted  . Small bowel ischemia ([place]) 09/17/2019  . Ischemic bowel disease ([place]) 09/17/2019  . Small bowel obstruction ([place])   . Acute on chronic respiratory failure with hypoxia ([place]) 03/17/2019  . IDA (iron deficiency anemia) 07/04/2018  . Acute metabolic encephalopathy [id number]  . Acute respiratory failure with hypoxia and hypercapnia ([place]) 07/02/2018  . GERD (gastroesophageal reflux disease) 06/13/2018  . Malnutrition ([place]) 06/13/2018  . Essential hypertension 06/13/2018  . Tachycardia 04/14/2018  . Acute gastritis 03/31/2018  . Esophagitis 03/31/2018  . Duodenal ulcer 03/31/2018  . Goals of care, counseling/discussion   . DNR (do not resuscitate) discussion   . Palliative care by specialist   . COPD with acute exacerbation ([place]) 03/29/2018  . COPD (chronic obstructive pulmonary disease) ([place]) 02/03/2018  . Hypoxia 11/28/2015  . COPD exacerbation ([place]) 11/28/2015  . Acute respiratory failure with hypoxia ([place]) 11/28/2015  . SOB (shortness of breath) 11/28/2015  . Mucosal abnormality of stomach   . History of colonic polyps 04/25/2015  . 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 Dysphagia 03/11/2011    Past Surgical History:  Procedure Laterality Date  . APPENDECTOMY    . BIOPSY  03/30/2018   Procedure: BIOPSY;  Surgeon: Daneil Dolin, MD;  Location: [facility] ENDO SUITE;  Service: Gastroenterology;;  gastric  . BOWEL RESECTION N/A 09/17/2019   Procedure: small bowel resection;  Surgeon: Virl Cagey, MD;  Location: [facility] ORS;  Service: General;  Laterality: N/A;  . CHOLECYSTECTOMY    . COLONOSCOPY  10/10/2008   RMR: 1. Anal papilla, otherwise normal rectum. 2. Two cecal arteriovenous malformations, colonic mucosa apperaed normal.   . COLONOSCOPY N/A 05/10/2015   RMR: Rectal and colonic polyps removed as described above. Ascending colon AVMs . Rectal polyps were tubular adenomas. Next  colonoscopy May 2021.  . ESOPHAGEAL DILATION N/A 05/10/2015   Procedure: ESOPHAGEAL DILATION;  Surgeon: Daneil Dolin, MD;  Location: [facility];  Service: Endoscopy;  Laterality: N/A;  . ESOPHAGOGASTRODUODENOSCOPY  04/02/2011   RMR: 1. Endoscopically normal -appearing esophagus status post passage of a Maloney dilator followed by biopsy. 2. Hiatal hernia, Mottling, and submucosal petechiae in teh gastric mucoas of uncertain significance status post biopsy. 3. Bulbar erosions as described above.   . ESOPHAGOGASTRODUODENOSCOPY N/A 05/10/2015   RMR: Normal esophagus status post Maloney dilation. Small hiatal hernia. Abnormal gastric because of doubtful clinical significance status post biopsy (benign)  . ESOPHAGOGASTRODUODENOSCOPY (EGD) WITH PROPOFOL N/A 03/30/2018   Procedure: ESOPHAGOGASTRODUODENOSCOPY (EGD) WITH PROPOFOL;  Surgeon: Daneil Dolin, MD;  Location: [facility];  Service: Gastroenterology;  Laterality: N/A;  WITH DILATION   . INGUINAL HERNIA REPAIR    . MALONEY DILATION  03/30/2018   Procedure: Venia Minks DILATION;  Surgeon: Daneil Dolin, MD;  Location: [facility];  Service: Gastroenterology;;  . TONSILLECTOMY    . TOTAL ABDOMINAL HYSTERECTOMY       OB History    Gravida  5   Para  5   Term  4   Preterm  1   AB      Living  4     SAB      TAB      Ectopic      Multiple      Live Births              Family History  Problem Relation Age of Onset  . Heart disease Mother   . Lung cancer Father   . Cancer Sister   . Diabetes Brother   . Colon cancer Neg Hx     Social History   Tobacco Use  . Smoking status: Former Smoker    Packs/day: 1.00    Years: 57.00    Pack years: 57.00    Types: Cigarettes    Start date: 08/18/1959    Quit date: 08/14/2016    Years since quitting: 3.5  . Smokeless tobacco: Never Used  Substance Use Topics  . Alcohol use: No    Alcohol/week: 0.0 standard drinks  . Drug use: No    Home Medications Prior to  Admission medications   Medication Sig Start Date End Date Taking? Authorizing Provider  albuterol (PROVENTIL) (2.5 MG/3ML) 0.083% nebulizer solution Take 2.5 mg by nebulization every 4 (four) hours as needed for shortness of breath.   Yes [provider]  albuterol (VENTOLIN HFA) 108 (90 Base) MCG/ACT inhaler Inhale 2 puffs into the lungs every 6 (six) hours as needed for wheezing or shortness of breath. 09/08/19  Yes Rancour, Annie Main, MD  docusate sodium (COLACE) 100 MG capsule Take 1 capsule (100 mg total) by mouth 2 (two) times daily as needed for mild constipation. 09/21/19 09/20/20 Yes Virl Cagey, MD  Multiple Vitamins-Minerals (MULTIVITAMIN ADULT) CHEW Chew 2 each by mouth daily. Vitafusion gummy   Yes [provider]  predniSONE (DELTASONE) 20 MG tablet 3 tabs po day one, then 2 po daily x 4 days 02/25/20   Truddie Hidden, MD    Allergies    Sevoflurane  Review of Systems   Review of Systems  Constitutional: Negative for fever.  HENT: Negative for congestion and sore throat.   Respiratory: Positive for shortness of breath. Negative for cough.   Cardiovascular: Positive for palpitations. Negative for chest pain.  Gastrointestinal: Negative for abdominal pain, diarrhea, nausea and vomiting.  Genitourinary: Negative for dysuria.  Musculoskeletal: Negative for myalgias.  Skin: Negative for rash.  Neurological: Negative for headaches.  Psychiatric/Behavioral: Negative for behavioral problems.    Physical Exam Updated Vital Signs BP 114/61   Pulse 74   Temp 97.8 F (36.6 C)   Resp 18   Wt 40.8 kg   SpO2 100%   BMI 14.98 kg/m   Physical Exam Constitutional:      Appearance: Normal appearance.  HENT:     Head: Normocephalic and atraumatic.     Nose: Nose normal.     Mouth/Throat:     Mouth: Mucous membranes are moist.  Eyes:     Extraocular Movements: Extraocular movements intact.     Conjunctiva/sclera: Conjunctivae normal.  Cardiovascular:       Rate and Rhythm: Normal rate.  Pulmonary:     Effort: Pulmonary effort is normal.     Breath sounds: Normal breath sounds.  Abdominal:     General: Abdomen is flat.     Palpations: Abdomen is soft.     Tenderness: There is no abdominal tenderness.  Musculoskeletal:        General: No swelling. Normal range of motion.     Cervical back: Neck supple.  Skin:    General: Skin is warm and dry.  Neurological:     General: No focal deficit present.     Mental Status: She is alert.  Psychiatric:        Mood and Affect: Mood normal.     ED Results / Procedures / Treatments   Labs (all labs ordered are listed, but only abnormal results are displayed) Labs Reviewed  BASIC METABOLIC PANEL - Abnormal; Notable for the following components:      Result Value   Glucose, Bld 104 (*)    Calcium 8.8 (*)    All other components within normal limits  CBC WITH DIFFERENTIAL/PLATELET    EKG EKG Interpretation  Date/Time:  Friday February 25 2020 16:40:23 EST Ventricular Rate:  76 PR Interval:  164 QRS Duration: 82 QT Interval:  376 QTC Calculation: 423 R Axis:   -96 Text Interpretation: Sinus rhythm with occasional Premature ventricular complexes Baseline wander Right superior axis deviation Abnormal ECG No significant change since last tracing Confirmed by Karle Starch  MD, Juanda Crumble [phone]) on 02/25/2020 4:45:41 PM   Radiology DG Chest Portable 1 View  Result Date: 02/25/2020 CLINICAL DATA:  Shortness of breath x3 days. EXAM: PORTABLE CHEST 1 VIEW COMPARISON:  None. FINDINGS: The lungs are hyperinflated. There is no evidence of acute infiltrate, pleural effusion or pneumothorax. The heart size and mediastinal contours are within normal limits. There is mild tortuosity of the descending thoracic aorta. The visualized skeletal structures are unremarkable. IMPRESSION: No active disease. Electronically Signed   By: Virgina Norfolk M.D.   On: 02/25/2020 17:52    Procedures Procedures (including  critical care time)  Medications Ordered in ED Medications  albuterol (VENTOLIN HFA) 108 (90 Base) MCG/ACT inhaler 2 puff (2 puffs Inhalation Given 02/25/20 1658)    ED Course  I have reviewed the triage vital signs and the nursing notes.  Pertinent labs & imaging results that were available during my care of the patient were reviewed by me and considered in my medical decision making (see chart for details).  Clinical Course as of Feb 24 1938  Ludwig Clarks Feb 25, 2020  1812 Patient here with vague shortness of breath and reports that she is out of her home oxygen and cannot get this renewed until she sees her doctor next week.  She does not seem to have any significant acute complaints but does have some mild wheezing on exam.  We will check her labs, give her albuterol MDI and reassess. CXR is normal.    [CS]  1928 Patient able to ambulate with no difficulties and no hypoxia on room air. Plan discharge with outpatient followup.    [CS]    Clinical Course User Index [CS] Truddie Hidden, MD   Final Clinical Impression(s) / ED Diagnoses Final diagnoses:  COPD exacerbation [facility])    Rx / DC Orders ED Discharge Orders         Ordered    predniSONE (DELTASONE) 20 MG tablet     02/25/20 1937           Truddie Hidden, MD 02/25/20 1939

## 2020-02-25 NOTE — ED Notes (Signed)
Signature pad not working has been reported

## 2020-02-25 NOTE — ED Notes (Signed)
Pt ambulated in the hall   Pulse from 100- 106  Ox from 98-100 on RA  Pt reported better than when she came in   (has had albuterol inhaler

## 2020-02-25 NOTE — ED Notes (Signed)
Pt reports chest pain yesterday   Feels like she cannot get a deep breath  Smoker but stopped several years ago  Saw Dr Luan Pulling Awaiting appt with new physician

## 2020-02-28 DIAGNOSIS — Z0189 Encounter for other specified special examinations: Secondary | ICD-10-CM | POA: Diagnosis not present

## 2020-02-28 DIAGNOSIS — J449 Chronic obstructive pulmonary disease, unspecified: Secondary | ICD-10-CM | POA: Diagnosis not present

## 2020-02-28 DIAGNOSIS — G3184 Mild cognitive impairment, so stated: Secondary | ICD-10-CM | POA: Diagnosis not present

## 2020-03-08 DIAGNOSIS — J441 Chronic obstructive pulmonary disease with (acute) exacerbation: Secondary | ICD-10-CM | POA: Diagnosis not present

## 2020-03-31 DIAGNOSIS — F0390 Unspecified dementia without behavioral disturbance: Secondary | ICD-10-CM | POA: Diagnosis not present

## 2020-03-31 DIAGNOSIS — J449 Chronic obstructive pulmonary disease, unspecified: Secondary | ICD-10-CM | POA: Diagnosis not present

## 2020-04-06 ENCOUNTER — Encounter: Payer: Self-pay | Admitting: Internal Medicine

## 2020-04-08 DIAGNOSIS — J441 Chronic obstructive pulmonary disease with (acute) exacerbation: Secondary | ICD-10-CM | POA: Diagnosis not present

## 2020-04-14 DIAGNOSIS — Z13220 Encounter for screening for lipoid disorders: Secondary | ICD-10-CM | POA: Diagnosis not present

## 2020-04-14 DIAGNOSIS — Z0000 Encounter for general adult medical examination without abnormal findings: Secondary | ICD-10-CM | POA: Diagnosis not present

## 2020-04-14 DIAGNOSIS — Z0189 Encounter for other specified special examinations: Secondary | ICD-10-CM | POA: Diagnosis not present

## 2020-04-14 DIAGNOSIS — R7301 Impaired fasting glucose: Secondary | ICD-10-CM | POA: Diagnosis not present

## 2020-04-19 DIAGNOSIS — I209 Angina pectoris, unspecified: Secondary | ICD-10-CM | POA: Diagnosis not present

## 2020-04-19 DIAGNOSIS — J449 Chronic obstructive pulmonary disease, unspecified: Secondary | ICD-10-CM | POA: Diagnosis not present

## 2020-04-19 DIAGNOSIS — R7301 Impaired fasting glucose: Secondary | ICD-10-CM | POA: Diagnosis not present

## 2020-04-19 DIAGNOSIS — F0390 Unspecified dementia without behavioral disturbance: Secondary | ICD-10-CM | POA: Diagnosis not present

## 2020-04-19 DIAGNOSIS — N1831 Chronic kidney disease, stage 3a: Secondary | ICD-10-CM | POA: Diagnosis not present

## 2020-05-08 DIAGNOSIS — J441 Chronic obstructive pulmonary disease with (acute) exacerbation: Secondary | ICD-10-CM | POA: Diagnosis not present

## 2020-05-16 ENCOUNTER — Encounter: Payer: Self-pay | Admitting: *Deleted

## 2020-05-16 ENCOUNTER — Encounter: Payer: Self-pay | Admitting: Cardiology

## 2020-05-16 ENCOUNTER — Telehealth: Payer: Self-pay | Admitting: Cardiology

## 2020-05-16 ENCOUNTER — Other Ambulatory Visit: Payer: Self-pay

## 2020-05-16 ENCOUNTER — Ambulatory Visit (INDEPENDENT_AMBULATORY_CARE_PROVIDER_SITE_OTHER): Payer: Medicare HMO | Admitting: Cardiology

## 2020-05-16 VITALS — BP 115/80 | HR 72 | Ht 60.0 in | Wt 96.4 lb

## 2020-05-16 DIAGNOSIS — R079 Chest pain, unspecified: Secondary | ICD-10-CM

## 2020-05-16 NOTE — Telephone Encounter (Signed)
Pre-cert Verification for the following procedure    LEXISCAN   DATE:05/25/2020  LOCATION:  [place]

## 2020-05-16 NOTE — Patient Instructions (Signed)
Your physician recommends that you schedule a follow-up appointment in: PENDING TEST RESULTS WITH DR [facility]  Your physician recommends that you continue on your current medications as directed. Please refer to the Current Medication list given to you today.  Your physician has requested that you have a lexiscan myoview. For further information please visit [email or web address]. Please follow instruction sheet, as given.  Thank you for choosing [facility]!!

## 2020-05-16 NOTE — Progress Notes (Signed)
Clinical Summary Ms. Agar is a 79 y.o.female seen as new consult. Last seen in our office 07/2015. Referred by Dr Nevada Crane for history of chest pain.    1. Chest pain - 01/2015 Lexiscan MPI without ischemia - no current symptoms  - ongoign for a few months. Pressure midchest, 5/10 in severity. +SOB. Can occur at rest or with activity. Not positional. Symptoms last about 1 hour. Episodes occur about once a week - some DOE with acitivities - no specific relation to food.    CAD risk factors: +tobacco x 75 years, mom CAD 69s-60s, brother MI in 53s.      2. COPD  - followed by pcp    Past Medical History:  Diagnosis Date  . Abnormal weight loss   . Acute respiratory failure with hypoxia ([place])   . Anxiety disorder, unspecified   . Asthma   . Chest pain, unspecified   . Chronic neck pain   . Chronic pain of left elbow   . Complication of anesthesia    hard to wake  . COPD (chronic obstructive pulmonary disease) ([place])   . Dysphagia    requiring multiple dilations in past  . Dysphagia, pharyngoesophageal phase   . Headache    Patient states she needs  eyeglasses  . Hyperlipidemia   . Hypoxemia   . Iron deficiency anemia   . Irregular heart beat   . Nicotine dependence, unspecified, uncomplicated   . Other amnesia   . Other diseases of stomach and duodenum   . Pain in left elbow   . Right lower quadrant pain   . S/P colonoscopy 2009   dr. Gala Romney: anal papilla, 2 cecal AVMs. Due in 2014  . S/P endoscopy 2012   Dr. Gala Romney: mild chronic gastritis, bulbar erosions, s/p 54-French Maloney dilation   . S/P endoscopy 2003, 2004, 2007   gastritis, duodenitis, s/p dilation, +H.pylori   . Shortness of breath   . Solitary pulmonary nodule   . Unspecified chronic gastritis without bleeding   . Unspecified protein-calorie malnutrition (HCC)      Allergies  Allergen Reactions  . Sevoflurane     Pt's son stated "she cannot take". Patient is not aware of this allergy      Current Outpatient Medications  Medication Sig Dispense Refill  . albuterol (PROVENTIL) (2.5 MG/3ML) 0.083% nebulizer solution Take 2.5 mg by nebulization every 4 (four) hours as needed for shortness of breath.    Marland Kitchen albuterol (VENTOLIN HFA) 108 (90 Base) MCG/ACT inhaler Inhale 2 puffs into the lungs every 6 (six) hours as needed for wheezing or shortness of breath. 6.7 g 0  . docusate sodium (COLACE) 100 MG capsule Take 1 capsule (100 mg total) by mouth 2 (two) times daily as needed for mild constipation. 60 capsule 2  . Multiple Vitamins-Minerals (MULTIVITAMIN ADULT) CHEW Chew 2 each by mouth daily. Vitafusion gummy    . predniSONE (DELTASONE) 20 MG tablet 3 tabs po day one, then 2 po daily x 4 days 11 tablet 0   No current facility-administered medications for this visit.     Past Surgical History:  Procedure Laterality Date  . APPENDECTOMY    . BIOPSY  03/30/2018   Procedure: BIOPSY;  Surgeon: Daneil Dolin, MD;  [facility];  Service: Gastroenterology;;  gastric  . BOWEL RESECTION N/A 09/17/2019   Procedure: small bowel resection;  Surgeon: Virl Cagey, MD;  Location: [facility];  Service: General;  Laterality: N/A;  .  CHOLECYSTECTOMY    . COLONOSCOPY  10/10/2008   RMR: 1. Anal papilla, otherwise normal rectum. 2. Two cecal arteriovenous malformations, colonic mucosa apperaed normal.   . COLONOSCOPY N/A 05/10/2015   RMR: Rectal and colonic polyps removed as described above. Ascending colon AVMs . Rectal polyps were tubular adenomas. Next colonoscopy May 2021.  . ESOPHAGEAL DILATION N/A 05/10/2015   Procedure: ESOPHAGEAL DILATION;  Surgeon: Daneil Dolin, MD;  [facility];  Service: Endoscopy;  Laterality: N/A;  . ESOPHAGOGASTRODUODENOSCOPY  04/02/2011   RMR: 1. Endoscopically normal -appearing esophagus status post passage of a Maloney dilator followed by biopsy. 2. Hiatal hernia, Mottling, and submucosal petechiae in teh gastric mucoas of uncertain  significance status post biopsy. 3. Bulbar erosions as described above.   . ESOPHAGOGASTRODUODENOSCOPY N/A 05/10/2015   RMR: Normal esophagus status post Maloney dilation. Small hiatal hernia. Abnormal gastric because of doubtful clinical significance status post biopsy (benign)  . ESOPHAGOGASTRODUODENOSCOPY (EGD) WITH PROPOFOL N/A 03/30/2018   Procedure: ESOPHAGOGASTRODUODENOSCOPY (EGD) WITH PROPOFOL;  Surgeon: Daneil Dolin, MD;  [facility];  Service: Gastroenterology;  Laterality: N/A;  WITH DILATION   . INGUINAL HERNIA REPAIR    . MALONEY DILATION  03/30/2018   Procedure: Venia Minks DILATION;  Surgeon: Daneil Dolin, MD;  [facility];  Service: Gastroenterology;;  . TONSILLECTOMY    . TOTAL ABDOMINAL HYSTERECTOMY       Allergies  Allergen Reactions  . Sevoflurane     Pt's son stated "she cannot take". Patient is not aware of this allergy      Family History  Problem Relation Age of Onset  . Heart disease Mother   . Lung cancer Father   . Cancer Sister   . Diabetes Brother   . Colon cancer Neg Hx      Social History Ms. Mctague reports that she quit smoking about 3 years ago. Her smoking use included cigarettes. She started smoking about 60 years ago. She has a 57.00 pack-year smoking history. She has never used smokeless tobacco. Ms. Shonka reports no history of alcohol use.   Review of Systems CONSTITUTIONAL: No weight loss, fever, chills, weakness or fatigue.  HEENT: Eyes: No visual loss, blurred vision, double vision or yellow sclerae.No hearing loss, sneezing, congestion, runny nose or sore throat.  SKIN: No rash or itching.  CARDIOVASCULAR: per hpi RESPIRATORY: per hpi GASTROINTESTINAL: No anorexia, nausea, vomiting or diarrhea. No abdominal pain or blood.  GENITOURINARY: No burning on urination, no polyuria NEUROLOGICAL: No headache, dizziness, syncope, paralysis, ataxia, numbness or tingling in the extremities. No change in bowel or bladder  control.  MUSCULOSKELETAL: No muscle, back pain, joint pain or stiffness.  LYMPHATICS: No enlarged nodes. No history of splenectomy.  PSYCHIATRIC: No history of depression or anxiety.  ENDOCRINOLOGIC: No reports of sweating, cold or heat intolerance. No polyuria or polydipsia.  Marland Kitchen   Physical Examination Today's Vitals   05/16/20 1454  BP: 115/80  Pulse: 72  SpO2: 98%  Weight: 96 lb 6.4 oz (43.7 kg)  Height: 5' (1.524 m)   Body mass index is 18.83 kg/m.  Gen: resting comfortably, no acute distress HEENT: no scleral icterus, pupils equal round and reactive, no palptable cervical adenopathy,  CV: RRR, no m/r/g, no jvd Resp: Clear to auscultation bilaterally GI: abdomen is soft, non-tender, non-distended, normal bowel sounds, no hepatosplenomegaly MSK: extremities are warm, no edema.  Skin: warm, no rash Neuro:  no focal deficits Psych: appropriate affect   Diagnostic Studies 01/2018  echo Study Conclusions   - Left ventricle: The cavity size was normal. Wall thickness was  normal. Systolic function was vigorous. The estimated ejection  fraction was in the range of 65% to 70%. Wall motion was normal;  there were no regional wall motion abnormalities. The study is  not technically sufficient to allow evaluation of LV diastolic  function.  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 Mean gradient (S): 21  mm Hg. Valve area (VTI): 1.15 cm^2. Valve area (Vmax): 1.53 cm^2.  - Mitral valve: Mildly to moderately calcified annulus. There is a  moderate gradient across the mitral valve. Mean gradient 8 mmHg  in the setting of tachycardia with heart rate 105. Visually there  looks to be mild mitral stenosis. Mean gradient (D): 8 mm Hg.  Valve area by continuity equation (using LVOT flow): 2.04 cm^2.  - Left atrium: The atrium was severely dilated.  - Right atrium: The atrium was moderately dilated.  - Atrial septum: No defect or patent foramen ovale was  identified.  - Pulmonary arteries: Systolic pressure was moderately increased.  PA peak pressure: 46 mm Hg (S).  - Technically difficult study.     01/2015 nuclear stress IMPRESSION: 1. No reversible ischemia or infarction.  2. Normal left ventricular wall motion.  3. Left ventricular ejection fraction 77%  4. Low-risk stress test findings*.   Assessment and Plan  1. Chest pain - unclear etiology. Extensive tobacco history, also family history of CAD - we will plan for a lexiscan to further evaluate - if negative may warrant repeat GI eval given her extensive history   F/u pending stress results.       Arnoldo Lenis, M.D.

## 2020-05-25 ENCOUNTER — Ambulatory Visit (HOSPITAL_COMMUNITY)
Admission: RE | Admit: 2020-05-25 | Discharge: 2020-05-25 | Disposition: A | Discharge status: Home / Self Care | Payer: Medicare HMO | Source: Ambulatory Visit | Attending: Cardiology | Admitting: Cardiology

## 2020-05-25 ENCOUNTER — Encounter (HOSPITAL_COMMUNITY)
Admission: RE | Admit: 2020-05-25 | Discharge: 2020-05-25 | Disposition: A | Discharge status: Home / Self Care | Payer: Medicare HMO | Source: Ambulatory Visit | Attending: Cardiology | Admitting: Cardiology

## 2020-05-25 ENCOUNTER — Encounter (HOSPITAL_COMMUNITY): Payer: Self-pay

## 2020-05-25 ENCOUNTER — Other Ambulatory Visit: Payer: Self-pay

## 2020-05-25 DIAGNOSIS — R079 Chest pain, unspecified: Secondary | ICD-10-CM

## 2020-05-25 LAB — NM MYOCAR MULTI W/SPECT W/WALL MOTION / EF
LV dias vol: 65 mL (ref 46–106)
LV sys vol: 20 mL
Peak HR: 78 {beats}/min
RATE: 0.37
Rest HR: 57 {beats}/min
SDS: 1
SRS: 2
SSS: 3
TID: 1.07

## 2020-05-25 MED ORDER — SODIUM CHLORIDE FLUSH 0.9 % IV SOLN
INTRAVENOUS | Status: AC
  Administered 2020-05-25: 10 mL via INTRAVENOUS
  Filled 2020-05-25: qty 10

## 2020-05-25 MED ORDER — REGADENOSON 0.4 MG/5ML IV SOLN
INTRAVENOUS | Status: AC
  Administered 2020-05-25: 0.4 mg via INTRAVENOUS
  Filled 2020-05-25: qty 5

## 2020-05-25 MED ORDER — TECHNETIUM TC 99M TETROFOSMIN IV KIT
30.0000 | PACK | Freq: Once | INTRAVENOUS | Status: AC | PRN
  Administered 2020-05-25: 32.5 via INTRAVENOUS

## 2020-05-25 MED ORDER — TECHNETIUM TC 99M TETROFOSMIN IV KIT
10.0000 | PACK | Freq: Once | INTRAVENOUS | Status: AC | PRN
  Administered 2020-05-25: 10 via INTRAVENOUS

## 2020-05-29 ENCOUNTER — Telehealth: Payer: Self-pay | Admitting: Cardiology

## 2020-05-29 NOTE — Telephone Encounter (Signed)
Patient called requesting test results of recent stress. States that she continues to have shortness of breath.

## 2020-05-29 NOTE — Telephone Encounter (Signed)
Pt aware and will f/u with pcp - 6 month f/u scheduled   Completely normal stress test, no signs of any blockages. Needs to f/u pcp to consider alternative causes of her symptoms, perhaps consider a repeat GI eval. F/u with [place] 6 months    [name] MD

## 2020-06-08 DIAGNOSIS — J441 Chronic obstructive pulmonary disease with (acute) exacerbation: Secondary | ICD-10-CM | POA: Diagnosis not present

## 2020-07-08 DIAGNOSIS — J441 Chronic obstructive pulmonary disease with (acute) exacerbation: Secondary | ICD-10-CM | POA: Diagnosis not present

## 2020-08-08 DIAGNOSIS — J441 Chronic obstructive pulmonary disease with (acute) exacerbation: Secondary | ICD-10-CM | POA: Diagnosis not present

## 2020-08-24 DIAGNOSIS — Z0001 Encounter for general adult medical examination with abnormal findings: Secondary | ICD-10-CM | POA: Diagnosis not present

## 2020-08-24 DIAGNOSIS — F0390 Unspecified dementia without behavioral disturbance: Secondary | ICD-10-CM | POA: Diagnosis not present

## 2020-08-24 DIAGNOSIS — R252 Cramp and spasm: Secondary | ICD-10-CM | POA: Diagnosis not present

## 2020-08-24 DIAGNOSIS — J069 Acute upper respiratory infection, unspecified: Secondary | ICD-10-CM | POA: Diagnosis not present

## 2020-09-08 DIAGNOSIS — J441 Chronic obstructive pulmonary disease with (acute) exacerbation: Secondary | ICD-10-CM | POA: Diagnosis not present

## 2020-10-08 DIAGNOSIS — J441 Chronic obstructive pulmonary disease with (acute) exacerbation: Secondary | ICD-10-CM | POA: Diagnosis not present

## 2020-11-08 DIAGNOSIS — J441 Chronic obstructive pulmonary disease with (acute) exacerbation: Secondary | ICD-10-CM | POA: Diagnosis not present

## 2020-11-28 ENCOUNTER — Ambulatory Visit: Payer: Medicare HMO | Admitting: Cardiology

## 2020-11-28 NOTE — Progress Notes (Unsigned)
Clinical Summary Katelyn [name] is a 79 y.o.female  1. Chest pain - 01/2015 Lexiscan MPI without ischemia - no current symptoms  - ongoign for a few months. Pressure midchest, 5/10 in severity. +SOB. Can occur at rest or with activity. Not positional. Symptoms last about 1 hour. Episodes occur about once a week - some DOE with acitivities - no specific relation to food.    CAD risk factors: +tobacco x 30 years, mom CAD 31s-60s, brother MI in 75s.   05/2020 nuclear stress: no ischemia     2. COPD  - followed by pcp   Past Medical History:  Diagnosis Date  . Abnormal weight loss   . Acute respiratory failure with hypoxia ([place])   . Anxiety disorder, unspecified   . Asthma   . Chest pain, unspecified   . Chronic neck pain   . Chronic pain of left elbow   . Complication of anesthesia    hard to wake  . COPD (chronic obstructive pulmonary disease) ([place])   . Dysphagia    requiring multiple dilations in past  . Dysphagia, pharyngoesophageal phase   . Headache    Patient states she needs  eyeglasses  . Hyperlipidemia   . Hypoxemia   . Iron deficiency anemia   . Irregular heart beat   . Nicotine dependence, unspecified, uncomplicated   . Other amnesia   . Other diseases of stomach and duodenum   . Pain in left elbow   . Right lower quadrant pain   . S/P colonoscopy 2009   dr. Gala Romney: anal papilla, 2 cecal AVMs. Due in 2014  . S/P endoscopy 2012   Dr. Gala Romney: mild chronic gastritis, bulbar erosions, s/p 54-French Maloney dilation   . S/P endoscopy 2003, 2004, 2007   gastritis, duodenitis, s/p dilation, +H.pylori   . Shortness of breath   . Solitary pulmonary nodule   . Unspecified chronic gastritis without bleeding   . Unspecified protein-calorie malnutrition (HCC)      Allergies  Allergen Reactions  . Sevoflurane     Pt's son stated "she cannot take". Patient is not aware of this allergy     Current Outpatient Medications  Medication Sig Dispense  Refill  . albuterol (PROVENTIL) (2.5 MG/3ML) 0.083% nebulizer solution Take 2.5 mg by nebulization every 4 (four) hours as needed for shortness of breath.    Marland Kitchen albuterol (VENTOLIN HFA) 108 (90 Base) MCG/ACT inhaler Inhale 2 puffs into the lungs every 6 (six) hours as needed for wheezing or shortness of breath. 6.7 g 0  . Multiple Vitamins-Minerals (MULTIVITAMIN ADULT) CHEW Chew 2 each by mouth daily. Vitafusion gummy     No current facility-administered medications for this visit.     Past Surgical History:  Procedure Laterality Date  . APPENDECTOMY    . BIOPSY  03/30/2018   Procedure: BIOPSY;  Surgeon: Daneil Dolin, MD;  [facility];  Service: Gastroenterology;;  gastric  . BOWEL RESECTION N/A 09/17/2019   Procedure: small bowel resection;  Surgeon: Virl Cagey, MD;  Location: [facility];  Service: General;  Laterality: N/A;  . CHOLECYSTECTOMY    . COLONOSCOPY  10/10/2008   RMR: 1. Anal papilla, otherwise normal rectum. 2. Two cecal arteriovenous malformations, colonic mucosa apperaed normal.   . COLONOSCOPY N/A 05/10/2015   RMR: Rectal and colonic polyps removed as described above. Ascending colon AVMs . Rectal polyps were tubular adenomas. Next colonoscopy May 2021.  . ESOPHAGEAL DILATION N/A 05/10/2015   Procedure: ESOPHAGEAL  DILATION;  Surgeon: Daneil Dolin, MD;  [facility];  Service: Endoscopy;  Laterality: N/A;  . ESOPHAGOGASTRODUODENOSCOPY  04/02/2011   RMR: 1. Endoscopically normal -appearing esophagus status post passage of a Maloney dilator followed by biopsy. 2. Hiatal hernia, Mottling, and submucosal petechiae in teh gastric mucoas of uncertain significance status post biopsy. 3. Bulbar erosions as described above.   . ESOPHAGOGASTRODUODENOSCOPY N/A 05/10/2015   RMR: Normal esophagus status post Maloney dilation. Small hiatal hernia. Abnormal gastric because of doubtful clinical significance status post biopsy (benign)  . ESOPHAGOGASTRODUODENOSCOPY  (EGD) WITH PROPOFOL N/A 03/30/2018   Procedure: ESOPHAGOGASTRODUODENOSCOPY (EGD) WITH PROPOFOL;  Surgeon: Daneil Dolin, MD;  [facility];  Service: Gastroenterology;  Laterality: N/A;  WITH DILATION   . INGUINAL HERNIA REPAIR    . MALONEY DILATION  03/30/2018   Procedure: Venia Minks DILATION;  Surgeon: Daneil Dolin, MD;  [facility];  Service: Gastroenterology;;  . TONSILLECTOMY    . TOTAL ABDOMINAL HYSTERECTOMY       Allergies  Allergen Reactions  . Sevoflurane     Pt's son stated "she cannot take". Patient is not aware of this allergy      Family History  Problem Relation Age of Onset  . Heart disease Mother   . Lung cancer Father   . Cancer Sister   . Diabetes Brother   . Colon cancer Neg Hx      Social History Katelyn [name] reports that she has been smoking cigarettes. She started smoking about 61 years ago. She has smoked for the past 57.00 years. She has never used smokeless tobacco. Katelyn [name] reports no history of alcohol use.   Review of Systems CONSTITUTIONAL: No weight loss, fever, chills, weakness or fatigue.  HEENT: Eyes: No visual loss, blurred vision, double vision or yellow sclerae.No hearing loss, sneezing, congestion, runny nose or sore throat.  SKIN: No rash or itching.  CARDIOVASCULAR:  RESPIRATORY: No shortness of breath, cough or sputum.  GASTROINTESTINAL: No anorexia, nausea, vomiting or diarrhea. No abdominal pain or blood.  GENITOURINARY: No burning on urination, no polyuria NEUROLOGICAL: No headache, dizziness, syncope, paralysis, ataxia, numbness or tingling in the extremities. No change in bowel or bladder control.  MUSCULOSKELETAL: No muscle, back pain, joint pain or stiffness.  LYMPHATICS: No enlarged nodes. No history of splenectomy.  PSYCHIATRIC: No history of depression or anxiety.  ENDOCRINOLOGIC: No reports of sweating, cold or heat intolerance. No polyuria or polydipsia.  Marland Kitchen   Physical Examination There were no  vitals filed for this visit. There were no vitals filed for this visit.  Gen: resting comfortably, no acute distress HEENT: no scleral icterus, pupils equal round and reactive, no palptable cervical adenopathy,  CV Resp: Clear to auscultation bilaterally GI: abdomen is soft, non-tender, non-distended, normal bowel sounds, no hepatosplenomegaly MSK: extremities are warm, no edema.  Skin: warm, no rash Neuro:  no focal deficits Psych: appropriate affect   Diagnostic Studies  01/2018 echo Study Conclusions   - Left ventricle: The cavity size was normal. Wall thickness was  normal. Systolic function was vigorous. The estimated ejection  fraction was in the range of 65% to 70%. Wall motion was normal;  there were no regional wall motion abnormalities. The study is  not technically sufficient to allow evaluation of LV diastolic  function.  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 Mean gradient (S): 21  mm Hg. Valve area (VTI): 1.15 cm^2. Valve area (Vmax): 1.53 cm^2.  - Mitral valve:  Mildly to moderately calcified annulus. There is a  moderate gradient across the mitral valve. Mean gradient 8 mmHg  in the setting of tachycardia with heart rate 105. Visually there  looks to be mild mitral stenosis. Mean gradient (D): 8 mm Hg.  Valve area by continuity equation (using LVOT flow): 2.04 cm^2.  - Left atrium: The atrium was severely dilated.  - Right atrium: The atrium was moderately dilated.  - Atrial septum: No defect or patent foramen ovale was identified.  - Pulmonary arteries: Systolic pressure was moderately increased.  PA peak pressure: 46 mm Hg (S).  - Technically difficult study.     01/2015 nuclear stress IMPRESSION: 1. No reversible ischemia or infarction.  2. Normal left ventricular wall motion.  3. Left ventricular ejection fraction 77%  4. Low-risk stress test findings*.  05/2020 nuclear stress  Electrically negative for ischemia   Normal perfusion. No ischemia  LVEF 70%  Low risk study.  Assessment and Plan  1. Chest pain - unclear etiology. Extensive tobacco history, also family history of CAD - we will plan for a lexiscan to further evaluate - if negative may warrant repeat GI eval given her extensive history      Arnoldo Lenis, M.D.

## 2020-12-04 ENCOUNTER — Encounter: Payer: Self-pay | Admitting: Cardiology

## 2020-12-08 ENCOUNTER — Telehealth: Payer: Self-pay | Admitting: Cardiology

## 2020-12-08 DIAGNOSIS — J441 Chronic obstructive pulmonary disease with (acute) exacerbation: Secondary | ICD-10-CM | POA: Diagnosis not present

## 2020-12-08 NOTE — Telephone Encounter (Signed)
  Patient Consent for Virtual Visit         [name] has provided verbal consent on 12/08/2020 for a virtual visit (video or telephone).   CONSENT FOR VIRTUAL VISIT FOR:  Katelyn [name]  By participating in this virtual visit I agree to the following:  I hereby voluntarily request, consent and authorize [facility] and its employed or contracted physicians, physician assistants, nurse practitioners or other licensed health care professionals (the Practitioner), to provide me with telemedicine health care services (the "Services") as deemed necessary by the treating Practitioner. I acknowledge and consent to receive the Services by the Practitioner via telemedicine. I understand that the telemedicine visit will involve communicating with the Practitioner through live audiovisual communication technology and the disclosure of certain medical information by electronic transmission. I acknowledge that I have been given the opportunity to request an in-person assessment or other available alternative prior to the telemedicine visit and am voluntarily participating in the telemedicine visit.  I understand that I have the right to withhold or withdraw my consent to the use of telemedicine in the course of my care at any time, without affecting my right to future care or treatment, and that the Practitioner or I may terminate the telemedicine visit at any time. I understand that I have the right to inspect all information obtained and/or recorded in the course of the telemedicine visit and may receive copies of available information for a reasonable fee.  I understand that some of the potential risks of receiving the Services via telemedicine include:  Marland Kitchen Delay or interruption in medical evaluation due to technological equipment failure or disruption; . Information transmitted may not be sufficient (e.g. poor resolution of images) to allow for appropriate medical decision making by the Practitioner;  and/or  . In rare instances, security protocols could fail, causing a breach of personal health information.  Furthermore, I acknowledge that it is my responsibility to provide information about my medical history, conditions and care that is complete and accurate to the best of my ability. I acknowledge that Practitioner's advice, recommendations, and/or decision may be based on factors not within their control, such as incomplete or inaccurate data provided by me or distortions of diagnostic images or specimens that may result from electronic transmissions. I understand that the practice of medicine is not an exact science and that Practitioner makes no warranties or guarantees regarding treatment outcomes. I acknowledge that a copy of this consent can be made available to me via my patient portal ([facility]), or I can request a printed copy by calling the office of [facility].    I understand that my insurance will be billed for this visit.   I have read or had this consent read to me. . I understand the contents of this consent, which adequately explains the benefits and risks of the Services being provided via telemedicine.  . I have been provided ample opportunity to ask questions regarding this consent and the Services and have had my questions answered to my satisfaction. . I give my informed consent for the services to be provided through the use of telemedicine in my medical care

## 2020-12-13 ENCOUNTER — Telehealth: Payer: Medicare HMO | Admitting: Cardiology

## 2020-12-13 NOTE — Progress Notes (Deleted)
{Choose 1 Note Type (Video or Telephone):[phone]}    Date:  12/13/2020   ID:  Katelyn [name], DOB 10/28/1941, MRN [id number] The patient was identified using 2 identifiers.  {Patient Location:[phone]::"Home"} {Provider Location:[phone]::"Home Office"}  PCP:  Patient, No Pcp Per  Cardiologist:  No primary care provider on file. *** Electrophysiologist:  None   Evaluation Performed:  {Choose Visit WUJW:[id number]::"AOZHYQ-MV Visit"}  Chief Complaint:  ***  History of Present Illness:    Katelyn [name] is a 79 y.o. female with ***  1. Chest pain - 01/2015 Lexiscan MPI without ischemia - no current symptoms  - ongoign for a few months. Pressure midchest, 5/10 in severity. +SOB. Can occur at rest or with activity. Not positional. Symptoms last about 1 hour. Episodes occur about once a week - some DOE with acitivities - no specific relation to food.    CAD risk factors: +tobacco x 84 years, mom CAD 75s-60s, brother MI in 19s.   - Nuclear stress test 05/2020 no ischemia  ?GI eval   2. COPD  - followed by pcp   The patient {does/does not:200015} have symptoms concerning for COVID-19 infection (fever, chills, cough, or new shortness of breath).    Past Medical History:  Diagnosis Date  . Abnormal weight loss   . Acute respiratory failure with hypoxia ([place])   . Anxiety disorder, unspecified   . Asthma   . Chest pain, unspecified   . Chronic neck pain   . Chronic pain of left elbow   . Complication of anesthesia    hard to wake  . COPD (chronic obstructive pulmonary disease) ([place])   . Dysphagia    requiring multiple dilations in past  . Dysphagia, pharyngoesophageal phase   . Headache    Patient states she needs  eyeglasses  . Hyperlipidemia   . Hypoxemia   . Iron deficiency anemia   . Irregular heart beat   . Nicotine dependence, unspecified, uncomplicated   . Other amnesia   . Other diseases of stomach and duodenum   . Pain in left elbow   .  Right lower quadrant pain   . S/P colonoscopy 2009   dr. Gala Romney: anal papilla, 2 cecal AVMs. Due in 2014  . S/P endoscopy 2012   Dr. Gala Romney: mild chronic gastritis, bulbar erosions, s/p 54-French Maloney dilation   . S/P endoscopy 2003, 2004, 2007   gastritis, duodenitis, s/p dilation, +H.pylori   . Shortness of breath   . Solitary pulmonary nodule   . Unspecified chronic gastritis without bleeding   . Unspecified protein-calorie malnutrition ([place])    Past Surgical History:  Procedure Laterality Date  . APPENDECTOMY    . BIOPSY  03/30/2018   Procedure: BIOPSY;  Surgeon: Daneil Dolin, MD;  [facility];  Service: Gastroenterology;;  gastric  . BOWEL RESECTION N/A 09/17/2019   Procedure: small bowel resection;  Surgeon: Virl Cagey, MD;  Location: [facility];  Service: General;  Laterality: N/A;  . CHOLECYSTECTOMY    . COLONOSCOPY  10/10/2008   RMR: 1. Anal papilla, otherwise normal rectum. 2. Two cecal arteriovenous malformations, colonic mucosa apperaed normal.   . COLONOSCOPY N/A 05/10/2015   RMR: Rectal and colonic polyps removed as described above. Ascending colon AVMs . Rectal polyps were tubular adenomas. Next colonoscopy May 2021.  . ESOPHAGEAL DILATION N/A 05/10/2015   Procedure: ESOPHAGEAL DILATION;  Surgeon: Daneil Dolin, MD;  [facility];  Service: Endoscopy;  Laterality: N/A;  . ESOPHAGOGASTRODUODENOSCOPY  04/02/2011  RMR: 1. Endoscopically normal -appearing esophagus status post passage of a Maloney dilator followed by biopsy. 2. Hiatal hernia, Mottling, and submucosal petechiae in teh gastric mucoas of uncertain significance status post biopsy. 3. Bulbar erosions as described above.   . ESOPHAGOGASTRODUODENOSCOPY N/A 05/10/2015   RMR: Normal esophagus status post Maloney dilation. Small hiatal hernia. Abnormal gastric because of doubtful clinical significance status post biopsy (benign)  . ESOPHAGOGASTRODUODENOSCOPY (EGD) WITH PROPOFOL N/A 03/30/2018    Procedure: ESOPHAGOGASTRODUODENOSCOPY (EGD) WITH PROPOFOL;  Surgeon: Corbin Ade, MD;  [facility];  Service: Gastroenterology;  Laterality: N/A;  WITH DILATION   . INGUINAL HERNIA REPAIR    . MALONEY DILATION  03/30/2018   Procedure: Elease Hashimoto DILATION;  Surgeon: Corbin Ade, MD;  [facility];  Service: Gastroenterology;;  . TONSILLECTOMY    . TOTAL ABDOMINAL HYSTERECTOMY       No outpatient medications have been marked as taking for the 12/13/20 encounter (Appointment) with Antoine Poche, MD.     Allergies:   Sevoflurane   Social History   Tobacco Use  . Smoking status: Current Some Day Smoker    Years: 57.00    Types: Cigarettes    Start date: 08/18/1959    Last attempt to quit: 08/14/2016    Years since quitting: 4.3  . Smokeless tobacco: Never Used  . Tobacco comment: 1 cigg only occasionally  Vaping Use  . Vaping Use: Never used  Substance Use Topics  . Alcohol use: No    Alcohol/week: 0.0 standard drinks  . Drug use: No     Family Hx: The patient's family history includes Cancer in her sister; Diabetes in her brother; Heart disease in her mother; Lung cancer in her father. There is no history of Colon cancer.  ROS:   Please see the history of present illness.    *** All other systems reviewed and are negative.   Prior CV studies:   The following studies were reviewed today:  01/2018 echo Study Conclusions   - Left ventricle: The cavity size was normal. Wall thickness was  normal. Systolic function was vigorous. The estimated ejection  fraction was in the range of 65% to 70%. Wall motion was normal;  there were no regional wall motion abnormalities. The study is  not technically sufficient to allow evaluation of LV diastolic  function.  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 Mean gradient (S): 21  mm Hg. Valve area (VTI): 1.15 cm^2. Valve area (Vmax): 1.53 cm^2.  - Mitral valve: Mildly to moderately  calcified annulus. There is a  moderate gradient across the mitral valve. Mean gradient 8 mmHg  in the setting of tachycardia with heart rate 105. Visually there  looks to be mild mitral stenosis. Mean gradient (D): 8 mm Hg.  Valve area by continuity equation (using LVOT flow): 2.04 cm^2.  - Left atrium: The atrium was severely dilated.  - Right atrium: The atrium was moderately dilated.  - Atrial septum: No defect or patent foramen ovale was identified.  - Pulmonary arteries: Systolic pressure was moderately increased.  PA peak pressure: 46 mm Hg (S).  - Technically difficult study.     01/2015 nuclear stress IMPRESSION: 1. No reversible ischemia or infarction.  2. Normal left ventricular wall motion.  3. Left ventricular ejection fraction 77%  4. Low-risk stress test findings*.   05/2020 nuclear stress  Electrically negative for ischemia  Normal perfusion. No ischemia  LVEF 70%  Low risk study.  Labs/Other Tests and Data Reviewed:    EKG:  {EKG/Telemetry Strips Reviewed:[phone]}  Recent Labs: 02/25/2020: BUN 10; Creatinine, Ser 0.77; Hemoglobin 12.0; Platelets 219; Potassium 4.1; Sodium 140   Recent Lipid Panel Lab Results  Component Value Date/Time   CHOL 202 (A) 07/28/2018 12:00 AM   TRIG 109 07/28/2018 12:00 AM   HDL 80 (A) 07/28/2018 12:00 AM   CHOLHDL 2.6 08/10/2015 07:16 AM   LDLCALC 101 07/28/2018 12:00 AM    Wt Readings from Last 3 Encounters:  05/16/20 96 lb 6.4 oz (43.7 kg)  02/25/20 90 lb (40.8 kg)  01/16/20 90 lb (40.8 kg)     Risk Assessment/Calculations:   {Does this patient have ATRIAL FIBRILLATION?:[id number]}  Objective:    Vital Signs:  There were no vitals taken for this visit.   {[facility] Virtual Exam (Optional):[phone]::"VITAL SIGNS:  reviewed"}  ASSESSMENT & PLAN:    1. Chest pain - unclear etiology. Extensive tobacco history, also family history of CAD - we will plan for a lexiscan to further  evaluate - if negative may warrant repeat GI eval given her extensive history   {Are you ordering a CV Procedure (e.g. stress test, cath, DCCV, TEE, etc)?   Press F2        :[id number]}    COVID-19 Education: The signs and symptoms of COVID-19 were discussed with the patient and how to seek care for testing (follow up with PCP or arrange E-visit).  ***The importance of social distancing was discussed today.  Time:   Today, I have spent *** minutes with the patient with telehealth technology discussing the above problems.     Medication Adjustments/Labs and Tests Ordered: Current medicines are reviewed at length with the patient today.  Concerns regarding medicines are outlined above.   Tests Ordered: No orders of the defined types were placed in this encounter.   Medication Changes: No orders of the defined types were placed in this encounter.   Follow Up:  {F/U [phone]} {follow up:15908}  Signed, Carlyle Dolly, MD  12/13/2020 8:21 AM    [facility]

## 2020-12-14 ENCOUNTER — Encounter: Payer: Self-pay | Admitting: Cardiology

## 2021-01-08 DIAGNOSIS — M542 Cervicalgia: Secondary | ICD-10-CM | POA: Diagnosis not present

## 2021-01-08 DIAGNOSIS — J441 Chronic obstructive pulmonary disease with (acute) exacerbation: Secondary | ICD-10-CM | POA: Diagnosis not present

## 2021-02-08 DIAGNOSIS — M542 Cervicalgia: Secondary | ICD-10-CM | POA: Diagnosis not present

## 2021-02-08 DIAGNOSIS — J441 Chronic obstructive pulmonary disease with (acute) exacerbation: Secondary | ICD-10-CM | POA: Diagnosis not present

## 2021-03-08 DIAGNOSIS — J441 Chronic obstructive pulmonary disease with (acute) exacerbation: Secondary | ICD-10-CM | POA: Diagnosis not present

## 2021-03-08 DIAGNOSIS — M542 Cervicalgia: Secondary | ICD-10-CM | POA: Diagnosis not present

## 2021-04-08 DIAGNOSIS — J441 Chronic obstructive pulmonary disease with (acute) exacerbation: Secondary | ICD-10-CM | POA: Diagnosis not present

## 2021-04-08 DIAGNOSIS — M542 Cervicalgia: Secondary | ICD-10-CM | POA: Diagnosis not present

## 2021-04-12 ENCOUNTER — Ambulatory Visit (HOSPITAL_COMMUNITY)
Admission: RE | Admit: 2021-04-12 | Discharge: 2021-04-12 | Disposition: A | Discharge status: Home / Self Care | Payer: Medicare Other | Source: Ambulatory Visit | Attending: Internal Medicine | Admitting: Internal Medicine

## 2021-04-12 ENCOUNTER — Ambulatory Visit (INDEPENDENT_AMBULATORY_CARE_PROVIDER_SITE_OTHER): Payer: Medicare Other | Admitting: Internal Medicine

## 2021-04-12 ENCOUNTER — Other Ambulatory Visit: Payer: Self-pay

## 2021-04-12 ENCOUNTER — Encounter: Payer: Self-pay | Admitting: Internal Medicine

## 2021-04-12 DIAGNOSIS — J449 Chronic obstructive pulmonary disease, unspecified: Secondary | ICD-10-CM | POA: Diagnosis not present

## 2021-04-12 NOTE — Patient Instructions (Signed)
Plan A = Automatic = Always=    Trelegy one click each am - take two good drags then rinse and gargle   Plan B = Backup (to supplement plan A, not to replace it) Only use your albuterol nebulizer  as a rescue medication to be used if you can't catch your breath by resting or doing a relaxed purse lip breathing pattern.  - The less you use it, the better it will work when you need it. - Ok to use  up  to every 4 hours if you must but call for appointment if use goes up over your usual need   Please remember to go to the  x-ray department  @  [facility] for your tests - we will call you with the results when they are available      Please schedule a follow up visit in 12  months but call sooner if needed

## 2021-04-12 NOTE — Progress Notes (Signed)
Katelyn [name], female    DOB: August 29, 1941,    MRN: [id number]   Brief patient profile:  54 yowf  Quit  Smoking completely 2021  from [place]  referred to pulmonary clinic in [place]  04/12/2021 by Dr   Margo Aye for copd eval/ improved since starting on trelegy      History of Present Illness  04/12/2021  Pulmonary/ 1st office eval/ Katelyn [name] / [facility]  Chief Complaint  Patient presents with  . Consult    Shortness of breath with activity  Dyspnea:  Still able to get mb and back daily slower than used to but better on trelegy  Cough: assoc pnds symptoms but no excess mucus  Sleep: no resp bed is flat 1-2 pillows  SABA use: avg once a week  02: has 02 tanks to use prn but not using  No obvious day to day or daytime variability or assoc excess/ purulent sputum or mucus plugs or hemoptysis or cp or chest tightness, subjective wheeze or overt sinus or hb symptoms.   Sleeping  without nocturnal  or early am exacerbation  of respiratory  c/o's or need for noct saba. Also denies any obvious fluctuation of symptoms with weather or environmental changes or other aggravating or alleviating factors except as outlined above   No unusual exposure hx or h/o childhood pna/ asthma or knowledge of premature birth.  Current Allergies, Complete Past Medical History, Past Surgical History, Family History, and Social History were reviewed in [employer] record.  ROS  The following are not active complaints unless bolded Hoarseness, sore throat, dysphagia, dental problems, itching, sneezing,  nasal congestion or discharge of excess mucus or purulent secretions, ear ache,   fever, chills, sweats, unintended wt loss or wt gain, classically pleuritic or exertional cp,  orthopnea pnd or arm/hand swelling  or leg swelling, presyncope, palpitations, abdominal pain, anorexia, nausea, vomiting, diarrhea  or change in bowel habits or change in bladder habits, change in stools or change in  urine, dysuria, hematuria,  rash, arthralgias, visual complaints, headache, numbness, weakness or ataxia or problems with walking or coordination,  change in mood or  memory.           Past Medical History:  Diagnosis Date  . Abnormal weight loss   . Acute respiratory failure with hypoxia ([facility])   . Anxiety disorder, unspecified   . Asthma   . Chest pain, unspecified   . Chronic neck pain   . Chronic pain of left elbow   . Complication of anesthesia    hard to wake  . COPD (chronic obstructive pulmonary disease) ([facility])   . Dysphagia    requiring multiple dilations in past  . Dysphagia, pharyngoesophageal phase   . Headache    Patient states she needs  eyeglasses  . Hyperlipidemia   . Hypoxemia   . Iron deficiency anemia   . Irregular heart beat   . Nicotine dependence, unspecified, uncomplicated   . Other amnesia   . Other diseases of stomach and duodenum   . Pain in left elbow   . Right lower quadrant pain   . S/P colonoscopy 2009   dr. Jena Gauss: anal papilla, 2 cecal AVMs. Due in 2014  . S/P endoscopy 2012   Dr. Jena Gauss: mild chronic gastritis, bulbar erosions, s/p 54-French Maloney dilation   . S/P endoscopy 2003, 2004, 2007   gastritis, duodenitis, s/p dilation, +H.pylori   . Shortness of breath   . Solitary pulmonary nodule   .  Unspecified chronic gastritis without bleeding   . Unspecified protein-calorie malnutrition ([facility])     Outpatient Medications Prior to Visit  Medication Sig Dispense Refill  . albuterol (PROVENTIL) (2.5 MG/3ML) 0.083% nebulizer solution Take 2.5 mg by nebulization every 4 (four) hours as needed for shortness of breath.    . Fluticasone-Umeclidin-Vilant (TRELEGY ELLIPTA) 100-62.5-25 MCG/INH AEPB Inhale 1 puff into the lungs.    . memantine (NAMENDA) 5 MG tablet Take 5 mg by mouth 2 (two) times daily.    . Multiple Vitamins-Minerals (MULTIVITAMIN ADULT) CHEW Chew 2 each by mouth daily. Vitafusion gummy    . albuterol (VENTOLIN HFA) 108 (90 Base)  MCG/ACT inhaler Inhale 2 puffs into the lungs every 6 (six) hours as needed for wheezing or shortness of breath. (Patient not taking: Reported on 04/12/2021) 6.7 g 0  . escitalopram (LEXAPRO) 5 MG tablet Take 5 mg by mouth daily.     No facility-administered medications prior to visit.     Objective:     BP 114/62 (BP Location: Left Arm, Cuff Size: Normal)   Pulse 67   Temp 97.9 F (36.6 C) (Other (Comment)) Comment (Src): wrist  Ht 5\' 4"  (1.626 m)   Wt 96 lb 9.6 oz (43.8 kg)   SpO2 98% Comment: Room air  BMI 16.58 kg/m   SpO2: 98 % (Room air)   HEENT : pt wearing mask not removed for exam due to covid - 19 concerns.   NECK :  without JVD/Nodes/TM/ nl carotid upstrokes bilaterally   LUNGS: no acc muscle use,  Min barrel  contour chest wall with bilateral  slightly decreased bs s audible wheeze and  without cough on insp or exp maneuvers and min  Hyperresonant  to  percussion bilaterally     CV:  RRR  no s3 or murmur or increase in P2, and no edema   ABD:  soft and nontender with pos end  insp Hoover's  in the supine position. No bruits or organomegaly appreciated, bowel sounds nl  MS:   Nl gait/  ext warm without deformities, calf tenderness, cyanosis or clubbing No obvious joint restrictions   SKIN: warm and dry without lesions    NEURO:  alert, approp, nl sensorium with  no motor or cerebellar deficits apparent.         CXR PA and Lateral:   04/12/2021 :    I personally reviewed images and agree with radiology impression as follows:   Hyperinflated. Mild, chronic appearing increased lung markings are seen        Assessment   COPD GOLD ?  Quit smoking 2021   - 04/12/2021  The proper method of use, as well as anticipated side effects, of an elipta  inhaler were discussed and demonstrated to the patient using teach back me 04/12/2021  Walked RA x 600 ft  @ moderate pace  stopped due to  End of study min sob with sats 95%      She would appear to have more of a  group B symptom/risk at this point and do just as well on Anoro esp if has any tendency to upper airway irritation or thrush from the ICS component but for now rec no change rx absent the above concerns and cognitive decline which argues to keep things as simple and consistent (in terms of using the elitpta device, for example) as possible  F/u can be in 12 months, sooner prn   Each maintenance medication was reviewed in detail including emphasizing  most importantly the difference between maintenance and prns and under what circumstances the prns are to be triggered using an action plan format where appropriate.  Total time for H and P, chart review, counseling, reviewing elipta device(s) , directly observing portions of ambulatory 02 saturation study/ and generating customized AVS unique to this office visit / same day charting = 45 min                    Sandrea Hughs, MD 04/12/2021

## 2021-04-13 ENCOUNTER — Encounter: Payer: Self-pay | Admitting: Internal Medicine

## 2021-04-13 NOTE — Assessment & Plan Note (Addendum)
Quit smoking 2021   - 04/12/2021  The proper method of use, as well as anticipated side effects, of an elipta  inhaler were discussed and demonstrated to the patient using teach back me 04/12/2021  Walked RA x 600 ft  @ moderate pace  stopped due to  End of study min sob with sats 95%      She would appear to have more of a group B symptom/risk at this point and do just as well on Anoro esp if has any tendency to upper airway irritation or thrush from the ICS component but for now rec no change rx absent the above concerns and cognitive decline which argues to keep things as simple and consistent (in terms of using the elitpta device, for example) as possible  F/u can be in 12 months, sooner prn   Each maintenance medication was reviewed in detail including emphasizing most importantly the difference between maintenance and prns and under what circumstances the prns are to be triggered using an action plan format where appropriate.  Total time for H and P, chart review, counseling, reviewing elipta device(s) , directly observing portions of ambulatory 02 saturation study/ and generating customized AVS unique to this office visit / same day charting = 45 min

## 2021-05-08 DIAGNOSIS — J441 Chronic obstructive pulmonary disease with (acute) exacerbation: Secondary | ICD-10-CM | POA: Diagnosis not present

## 2021-05-08 DIAGNOSIS — M542 Cervicalgia: Secondary | ICD-10-CM | POA: Diagnosis not present

## 2021-06-08 DIAGNOSIS — J441 Chronic obstructive pulmonary disease with (acute) exacerbation: Secondary | ICD-10-CM | POA: Diagnosis not present

## 2021-06-08 DIAGNOSIS — M542 Cervicalgia: Secondary | ICD-10-CM | POA: Diagnosis not present

## 2021-07-03 ENCOUNTER — Other Ambulatory Visit: Payer: Self-pay

## 2021-07-03 ENCOUNTER — Emergency Department (HOSPITAL_COMMUNITY): Payer: Medicare Other

## 2021-07-03 ENCOUNTER — Encounter (HOSPITAL_COMMUNITY): Payer: Self-pay | Admitting: Emergency Medicine

## 2021-07-03 ENCOUNTER — Emergency Department (HOSPITAL_COMMUNITY)
Admission: EM | Admit: 2021-07-03 | Discharge: 2021-07-03 | Disposition: A | Discharge status: Home / Self Care | Payer: Medicare Other | Attending: Emergency Medicine | Admitting: Emergency Medicine

## 2021-07-03 DIAGNOSIS — F17210 Nicotine dependence, cigarettes, uncomplicated: Secondary | ICD-10-CM | POA: Insufficient documentation

## 2021-07-03 DIAGNOSIS — F0390 Unspecified dementia without behavioral disturbance: Secondary | ICD-10-CM | POA: Insufficient documentation

## 2021-07-03 DIAGNOSIS — J45909 Unspecified asthma, uncomplicated: Secondary | ICD-10-CM | POA: Insufficient documentation

## 2021-07-03 DIAGNOSIS — J441 Chronic obstructive pulmonary disease with (acute) exacerbation: Secondary | ICD-10-CM | POA: Insufficient documentation

## 2021-07-03 DIAGNOSIS — I10 Essential (primary) hypertension: Secondary | ICD-10-CM | POA: Insufficient documentation

## 2021-07-03 DIAGNOSIS — Z79899 Other long term (current) drug therapy: Secondary | ICD-10-CM | POA: Diagnosis not present

## 2021-07-03 DIAGNOSIS — R0602 Shortness of breath: Secondary | ICD-10-CM | POA: Diagnosis present

## 2021-07-03 LAB — BLOOD GAS, ARTERIAL
Acid-Base Excess: 3.6 mmol/L — ABNORMAL HIGH (ref 0.0–2.0)
Bicarbonate: 27.2 mmol/L (ref 20.0–28.0)
FIO2: 21
O2 Saturation: 94.6 %
Patient temperature: 37
pCO2 arterial: 45.7 mmHg (ref 32.0–48.0)
pH, Arterial: 7.405 (ref 7.350–7.450)
pO2, Arterial: 75.4 mmHg — ABNORMAL LOW (ref 83.0–108.0)

## 2021-07-03 LAB — COMPREHENSIVE METABOLIC PANEL
ALT: 18 U/L (ref 0–44)
AST: 27 U/L (ref 15–41)
Albumin: 4.2 g/dL (ref 3.5–5.0)
Alkaline Phosphatase: 81 U/L (ref 38–126)
Anion gap: 8 (ref 5–15)
BUN: 17 mg/dL (ref 8–23)
CO2: 29 mmol/L (ref 22–32)
Calcium: 9.4 mg/dL (ref 8.9–10.3)
Chloride: 104 mmol/L (ref 98–111)
Creatinine, Ser: 0.81 mg/dL (ref 0.44–1.00)
GFR, Estimated: >60 mL/min (ref >60)
Glucose, Bld: 94 mg/dL (ref 70–99)
Potassium: 4.2 mmol/L (ref 3.5–5.1)
Sodium: 141 mmol/L (ref 135–145)
Total Bilirubin: 0.9 mg/dL (ref 0.3–1.2)
Total Protein: 6.5 g/dL (ref 6.5–8.1)

## 2021-07-03 LAB — BRAIN NATRIURETIC PEPTIDE: B Natriuretic Peptide: 214 pg/mL — ABNORMAL HIGH (ref 0.0–100.0)

## 2021-07-03 LAB — CBC WITH DIFFERENTIAL/PLATELET
Abs Immature Granulocytes: 0.01 10*3/uL (ref 0.00–0.07)
Basophils Absolute: 0 10*3/uL (ref 0.0–0.1)
Basophils Relative: 1 %
Eosinophils Absolute: 0.2 10*3/uL (ref 0.0–0.5)
Eosinophils Relative: 3 %
HCT: 40.3 % (ref 36.0–46.0)
Hemoglobin: 13 g/dL (ref 12.0–15.0)
Immature Granulocytes: 0 %
Lymphocytes Relative: 25 %
Lymphs Abs: 1.4 10*3/uL (ref 0.7–4.0)
MCH: 30 pg (ref 26.0–34.0)
MCHC: 32.3 g/dL (ref 30.0–36.0)
MCV: 92.9 fL (ref 80.0–100.0)
Monocytes Absolute: 0.5 10*3/uL (ref 0.1–1.0)
Monocytes Relative: 8 %
Neutro Abs: 3.4 10*3/uL (ref 1.7–7.7)
Neutrophils Relative %: 63 %
Platelets: 185 10*3/uL (ref 150–400)
RBC: 4.34 MIL/uL (ref 3.87–5.11)
RDW: 13.7 % (ref 11.5–15.5)
WBC: 5.5 10*3/uL (ref 4.0–10.5)
nRBC: 0 % (ref 0.0–0.2)

## 2021-07-03 LAB — TROPONIN I (HIGH SENSITIVITY): Troponin I (High Sensitivity): 4 ng/L (ref <18)

## 2021-07-03 LAB — CBG MONITORING, ED: Glucose-Capillary: 88 mg/dL (ref 70–99)

## 2021-07-03 MED ORDER — PREDNISONE 20 MG PO TABS
40.0000 mg | ORAL_TABLET | Freq: Once | ORAL | Status: AC
  Administered 2021-07-03: 40 mg via ORAL
  Filled 2021-07-03: qty 2

## 2021-07-03 MED ORDER — PREDNISONE 20 MG PO TABS: ORAL_TABLET | ORAL | 0 refills | Status: AC

## 2021-07-03 MED ORDER — ALBUTEROL SULFATE HFA 108 (90 BASE) MCG/ACT IN AERS
4.0000 | INHALATION_SPRAY | Freq: Once | RESPIRATORY_TRACT | Status: AC
  Administered 2021-07-03: 4 via RESPIRATORY_TRACT
  Filled 2021-07-03: qty 6.7

## 2021-07-03 MED ORDER — AEROCHAMBER PLUS FLO-VU MISC
1.0000 | Freq: Once | Status: AC
  Administered 2021-07-03: 1
  Filled 2021-07-03: qty 1

## 2021-07-03 NOTE — ED Notes (Signed)
Respiratory therapist gave inhaler

## 2021-07-03 NOTE — ED Provider Notes (Signed)
[facility] EMERGENCY DEPARTMENT Provider Note   CSN: [id number] Arrival date & time: 07/03/21  1022  LEVEL 5 CAVEAT - DEMENTIA   History Chief Complaint  Patient presents with   Shortness of Breath    Katelyn [name] is a 80 y.o. female.  HPI 80 year old female presents with shortness of breath.  History is mostly from the son at the bedside.  The patient's been having some dyspnea for a few days and the patient endorsed some cough as well.  Some chest discomfort.  No fevers at home.  Has tried albuterol without much relief.  Otherwise, she has a longstanding history of dementia and seems to be at her mental baseline per the son.  She told him she needed to go to the ER due to dyspnea today which is why brought her in now.  Past Medical History:  Diagnosis Date   Abnormal weight loss    Acute respiratory failure with hypoxia (HCC)    Anxiety disorder, unspecified    Asthma    Chest pain, unspecified    Chronic neck pain    Chronic pain of left elbow    Complication of anesthesia    hard to wake   COPD (chronic obstructive pulmonary disease) ([place])    Dysphagia    requiring multiple dilations in past   Dysphagia, pharyngoesophageal phase    Headache    Patient states she needs  eyeglasses   Hyperlipidemia    Hypoxemia    Iron deficiency anemia    Irregular heart beat    Nicotine dependence, unspecified, uncomplicated    Other amnesia    Other diseases of stomach and duodenum    Pain in left elbow    Right lower quadrant pain    S/P colonoscopy 2009   dr. Gala Romney: anal papilla, 2 cecal AVMs. Due in 2014   S/P endoscopy 2012   Dr. 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   Small bowel obstruction (HCC)    Acute on chronic respiratory failure with hypoxia ([place]) 03/17/2019   IDA (iron deficiency anemia) 99/24/2683   Acute metabolic encephalopathy 41/96/2229   Acute respiratory failure with hypoxia and hypercapnia ([place]) 07/02/2018   GERD (gastroesophageal reflux disease) 06/13/2018   Malnutrition ([place]) 06/13/2018   Essential hypertension 06/13/2018   Tachycardia 04/14/2018   Acute gastritis 03/31/2018   Esophagitis 03/31/2018   Duodenal ulcer 03/31/2018   Goals of care, counseling/discussion    DNR (do not resuscitate) discussion    Palliative care by specialist    COPD with acute exacerbation ([place]) 03/29/2018   COPD GOLD ?  02/03/2018   Hypoxia 11/28/2015   COPD exacerbation ([place]) 11/28/2015   Acute respiratory failure with hypoxia ([place]) 11/28/2015   SOB (shortness of breath) 11/28/2015   Mucosal abnormality of stomach    History of colonic polyps 04/25/2015   Gastritis, chronic 04/28/2011   Dysphagia 03/11/2011    Past Surgical History:  Procedure Laterality Date   APPENDECTOMY     BIOPSY  03/30/2018   Procedure: BIOPSY;  Surgeon: Daneil Dolin, MD;  Location:  [facility];  Service: Gastroenterology;;  gastric   BOWEL RESECTION N/A 09/17/2019   Procedure: small bowel resection;  Surgeon: Virl Cagey, MD;  Location: [facility] ORS;  Service: General;  Laterality: N/A;   CHOLECYSTECTOMY     COLONOSCOPY  10/10/2008   RMR: 1. Anal papilla, otherwise normal rectum. 2. Two cecal arteriovenous malformations, colonic mucosa apperaed normal.    COLONOSCOPY N/A 05/10/2015   RMR: Rectal and colonic polyps removed as described above. Ascending colon AVMs . Rectal polyps were tubular adenomas. Next colonoscopy May 2021.   ESOPHAGEAL DILATION N/A 05/10/2015   Procedure: ESOPHAGEAL DILATION;  Surgeon: Daneil Dolin, MD;  Location: [facility];   Service: Endoscopy;  Laterality: N/A;   ESOPHAGOGASTRODUODENOSCOPY  04/02/2011   RMR: 1. Endoscopically normal -appearing esophagus status post passage of a Maloney dilator followed by biopsy. 2. Hiatal hernia, Mottling, and submucosal petechiae in teh gastric mucoas of uncertain significance status post biopsy. 3. Bulbar erosions as described above.    ESOPHAGOGASTRODUODENOSCOPY N/A 05/10/2015   RMR: Normal esophagus status post Maloney dilation. Small hiatal hernia. Abnormal gastric because of doubtful clinical significance status post biopsy (benign)   ESOPHAGOGASTRODUODENOSCOPY (EGD) WITH PROPOFOL N/A 03/30/2018   Procedure: ESOPHAGOGASTRODUODENOSCOPY (EGD) WITH PROPOFOL;  Surgeon: Daneil Dolin, MD;  Location: [facility];  Service: Gastroenterology;  Laterality: N/A;  WITH DILATION    INGUINAL HERNIA REPAIR     MALONEY DILATION  03/30/2018   Procedure: MALONEY DILATION;  Surgeon: Daneil Dolin, MD;  Location: [facility];  Service: Gastroenterology;;   TONSILLECTOMY     TOTAL ABDOMINAL HYSTERECTOMY       OB History     Gravida  5   Para  5   Term  4   Preterm  1   AB      Living  4      SAB      IAB      Ectopic      Multiple      Live Births              Family History  Problem Relation Age of Onset   Heart disease Mother    Lung cancer Father    Cancer Sister    Diabetes Brother    Colon cancer Neg Hx     Social History   Tobacco Use   Smoking status: Some Days    Years: 57.00    Pack years: 0.00    Types: Cigarettes    Start date: 08/18/1959    Last attempt to quit: 08/14/2016    Years since quitting: 4.8   Smokeless tobacco: Never  Vaping Use   Vaping Use: Never used  Substance Use Topics   Alcohol use: No    Alcohol/week: 0.0 standard drinks   Drug use: No    Home Medications Prior to Admission medications   Medication Sig Start Date End Date Taking? Authorizing Provider  albuterol (PROVENTIL) (2.5 MG/3ML) 0.083% nebulizer  solution Take 2.5 mg by nebulization every 4 (four) hours as needed for shortness of breath.   Yes [provider]  albuterol (VENTOLIN HFA) 108 (90 Base) MCG/ACT inhaler Inhale 2 puffs into the lungs every 6 (six) hours as needed for wheezing or shortness of breath. 09/08/19  Yes Rancour, Annie Main, MD  escitalopram (LEXAPRO) 5 MG tablet Take 5 mg by mouth daily.   Yes [provider]  Fluticasone-Umeclidin-Vilant (TRELEGY ELLIPTA) 100-62.5-25 MCG/INH AEPB Inhale 1 puff into the lungs.  Yes [provider]  memantine (NAMENDA) 5 MG tablet Take 5 mg by mouth 2 (two) times daily. 02/22/21  Yes [provider]  predniSONE (DELTASONE) 20 MG tablet 2 tabs po daily x 4 days 07/04/21  Yes Sherwood Gambler, MD  Multiple Vitamins-Minerals (MULTIVITAMIN ADULT) CHEW Chew 2 each by mouth daily. Vitafusion gummy Patient not taking: Reported on 07/03/2021    [provider]    Allergies    Sevoflurane  Review of Systems   Review of Systems  Unable to perform ROS: Dementia   Physical Exam Updated Vital Signs BP 131/74   Pulse 67   Temp 98.2 F (36.8 C) (Oral)   Resp (!) 21   Ht 5\' 1"  (1.549 m)   Wt 35.8 kg   SpO2 97%   BMI 14.93 kg/m   Physical Exam Vitals and nursing note reviewed.  Constitutional:      General: She is not in acute distress.    Appearance: She is well-developed. She is not ill-appearing or diaphoretic.  HENT:     Head: Normocephalic and atraumatic.     Right Ear: External ear normal.     Left Ear: External ear normal.     Nose: Nose normal.  Eyes:     General:        Right eye: No discharge.        Left eye: No discharge.  Cardiovascular:     Rate and Rhythm: Normal rate and regular rhythm.     Heart sounds: Normal heart sounds.  Pulmonary:     Effort: Pulmonary effort is normal. No accessory muscle usage or respiratory distress.     Breath sounds: Decreased breath sounds present. No wheezing.  Abdominal:     Palpations:  Abdomen is soft.     Tenderness: There is no abdominal tenderness.  Musculoskeletal:     Right lower leg: No edema.     Left lower leg: No edema.  Skin:    General: Skin is warm and dry.  Neurological:     Mental Status: She is alert. Mental status is at baseline.  Psychiatric:        Mood and Affect: Mood is not anxious.    ED Results / Procedures / Treatments   Labs (all labs ordered are listed, but only abnormal results are displayed) Labs Reviewed  BRAIN NATRIURETIC PEPTIDE - Abnormal; Notable for the following components:      Result Value   B Natriuretic Peptide 214.0 (*)    All other components within normal limits  BLOOD GAS, ARTERIAL - Abnormal; Notable for the following components:   pO2, Arterial 75.4 (*)    Acid-Base Excess 3.6 (*)    All other components within normal limits  COMPREHENSIVE METABOLIC PANEL  CBC WITH DIFFERENTIAL/PLATELET  CBG MONITORING, ED  TROPONIN I (HIGH SENSITIVITY)    EKG EKG Interpretation  Date/Time:  Tuesday July 03 2021 11:10:08 EDT Ventricular Rate:  66 PR Interval:  164 QRS Duration: 94 QT Interval:  441 QTC Calculation: 463 R Axis:   -79 Text Interpretation: Sinus rhythm Left anterior fascicular block Abnormal R-wave progression, early transition Confirmed by Sherwood Gambler [phone]) on 07/03/2021 12:07:58 PM  Radiology DG Chest 2 View  Result Date: 07/03/2021 CLINICAL DATA:  Cough, dyspnea. EXAM: CHEST - 2 VIEW COMPARISON:  April 12, 2021. FINDINGS: The heart size and mediastinal contours are within normal limits. Both lungs are clear. No visible pleural effusions or pneumothorax. No acute osseous abnormality. IMPRESSION: No active cardiopulmonary  disease. Electronically Signed   By: Margaretha Sheffield MD   On: 07/03/2021 12:04    Procedures Procedures   Medications Ordered in ED Medications  albuterol (VENTOLIN HFA) 108 (90 Base) MCG/ACT inhaler 4 puff (4 puffs Inhalation Given 07/03/21 1109)  aerochamber plus with mask  device 1 each (1 each Other Given 07/03/21 1204)  predniSONE (DELTASONE) tablet 40 mg (40 mg Oral Given 07/03/21 1246)    ED Course  I have reviewed the triage vital signs and the nursing notes.  Pertinent labs & imaging results that were available during my care of the patient were reviewed by me and considered in my medical decision making (see chart for details).    MDM Rules/Calculators/A&P                          Patient is feeling much better after albuterol. Likely a mild COPD exacerbation. No obvious pneumonia. ECG, labs ok. Will treat with albuterol and steroid burst.  Final Clinical Impression(s) / ED Diagnoses Final diagnoses:  COPD exacerbation ([place])    Rx / DC Orders ED Discharge Orders          Ordered    predniSONE (DELTASONE) 20 MG tablet        07/03/21 1245             Sherwood Gambler, MD 07/03/21 1519

## 2021-07-03 NOTE — Discharge Instructions (Addendum)
Start the prednisone tomorrow.  You were given the first dose today.  If you develop new or worsening shortness of breath, chest pain, fever, coughing up blood, or any other new/concerning symptoms then return to the ER for evaluation.

## 2021-07-03 NOTE — ED Triage Notes (Signed)
Pt to the ED with shortness of breath for the past month.

## 2021-07-03 NOTE — ED Notes (Signed)
Pt not a good historian. Son coming inside to help with questions.

## 2021-07-08 DIAGNOSIS — M542 Cervicalgia: Secondary | ICD-10-CM | POA: Diagnosis not present

## 2021-07-08 DIAGNOSIS — J441 Chronic obstructive pulmonary disease with (acute) exacerbation: Secondary | ICD-10-CM | POA: Diagnosis not present

## 2021-08-08 DIAGNOSIS — J441 Chronic obstructive pulmonary disease with (acute) exacerbation: Secondary | ICD-10-CM | POA: Diagnosis not present

## 2021-08-08 DIAGNOSIS — M542 Cervicalgia: Secondary | ICD-10-CM | POA: Diagnosis not present

## 2021-09-08 DIAGNOSIS — M542 Cervicalgia: Secondary | ICD-10-CM | POA: Diagnosis not present

## 2021-09-08 DIAGNOSIS — J441 Chronic obstructive pulmonary disease with (acute) exacerbation: Secondary | ICD-10-CM | POA: Diagnosis not present

## 2021-09-17 DIAGNOSIS — J449 Chronic obstructive pulmonary disease, unspecified: Secondary | ICD-10-CM | POA: Diagnosis not present

## 2025-01-23 DEATH — deceased
